# Patient Record
Sex: Female | Born: 1960 | Race: White | Hispanic: No | Marital: Single | Smoking: Never smoker
Health system: Southern US, Community
[De-identification: ages and names within clinical notes are randomized; demographics above are authoritative.]

## PROBLEM LIST (undated history)

## (undated) DIAGNOSIS — N200 Calculus of kidney: Secondary | ICD-10-CM

## (undated) DIAGNOSIS — I10 Essential (primary) hypertension: Secondary | ICD-10-CM

## (undated) DIAGNOSIS — H9190 Unspecified hearing loss, unspecified ear: Secondary | ICD-10-CM

## (undated) DIAGNOSIS — G4730 Sleep apnea, unspecified: Secondary | ICD-10-CM

## (undated) DIAGNOSIS — H539 Unspecified visual disturbance: Secondary | ICD-10-CM

## (undated) DIAGNOSIS — G35 Multiple sclerosis: Secondary | ICD-10-CM

## (undated) DIAGNOSIS — T4145XA Adverse effect of unspecified anesthetic, initial encounter: Secondary | ICD-10-CM

## (undated) DIAGNOSIS — I639 Cerebral infarction, unspecified: Secondary | ICD-10-CM

## (undated) DIAGNOSIS — T8859XA Other complications of anesthesia, initial encounter: Secondary | ICD-10-CM

## (undated) HISTORY — PX: LAPAROSCOPIC GASTRIC RESTRICTIVE DUODENAL PROCEDURE (DUODENAL SWITCH): SHX6667

## (undated) HISTORY — DX: Calculus of kidney: N20.0

## (undated) HISTORY — DX: Unspecified hearing loss, unspecified ear: H91.90

## (undated) HISTORY — DX: Unspecified visual disturbance: H53.9

---

## 1998-01-27 HISTORY — PX: CHOLECYSTECTOMY: SHX55

## 2014-06-03 ENCOUNTER — Encounter: Payer: Self-pay | Admitting: Emergency Medicine

## 2014-06-03 ENCOUNTER — Inpatient Hospital Stay
Admission: EM | Admit: 2014-06-03 | Discharge: 2014-06-07 | DRG: 060 | Disposition: A | Discharge status: Home / Self Care | Payer: BLUE CROSS/BLUE SHIELD | Attending: Internal Medicine | Admitting: Internal Medicine

## 2014-06-03 DIAGNOSIS — I10 Essential (primary) hypertension: Secondary | ICD-10-CM | POA: Diagnosis present

## 2014-06-03 DIAGNOSIS — E669 Obesity, unspecified: Secondary | ICD-10-CM | POA: Diagnosis present

## 2014-06-03 DIAGNOSIS — Z791 Long term (current) use of non-steroidal anti-inflammatories (NSAID): Secondary | ICD-10-CM

## 2014-06-03 DIAGNOSIS — G35 Multiple sclerosis: Secondary | ICD-10-CM | POA: Diagnosis not present

## 2014-06-03 DIAGNOSIS — H9190 Unspecified hearing loss, unspecified ear: Secondary | ICD-10-CM | POA: Diagnosis present

## 2014-06-03 DIAGNOSIS — Z885 Allergy status to narcotic agent status: Secondary | ICD-10-CM

## 2014-06-03 DIAGNOSIS — Z6839 Body mass index (BMI) 39.0-39.9, adult: Secondary | ICD-10-CM

## 2014-06-03 DIAGNOSIS — Z79899 Other long term (current) drug therapy: Secondary | ICD-10-CM

## 2014-06-03 DIAGNOSIS — K219 Gastro-esophageal reflux disease without esophagitis: Secondary | ICD-10-CM | POA: Diagnosis present

## 2014-06-03 DIAGNOSIS — G4733 Obstructive sleep apnea (adult) (pediatric): Secondary | ICD-10-CM | POA: Diagnosis present

## 2014-06-03 DIAGNOSIS — H0520 Unspecified exophthalmos: Secondary | ICD-10-CM | POA: Diagnosis present

## 2014-06-03 DIAGNOSIS — R51 Headache: Secondary | ICD-10-CM | POA: Diagnosis present

## 2014-06-03 DIAGNOSIS — Z9989 Dependence on other enabling machines and devices: Secondary | ICD-10-CM

## 2014-06-03 DIAGNOSIS — F419 Anxiety disorder, unspecified: Secondary | ICD-10-CM | POA: Diagnosis present

## 2014-06-03 DIAGNOSIS — E876 Hypokalemia: Secondary | ICD-10-CM | POA: Diagnosis present

## 2014-06-03 HISTORY — DX: Essential (primary) hypertension: I10

## 2014-06-03 HISTORY — DX: Multiple sclerosis: G35

## 2014-06-03 LAB — POC URINE PREG, ED: URINE-OTHER: NEGATIVE

## 2014-06-03 MED ORDER — ONDANSETRON HCL 4 MG/2ML IJ SOLN
4.0000 mg | Freq: Once | INTRAMUSCULAR | Status: AC
  Administered 2014-06-04: 4 mg via INTRAVENOUS

## 2014-06-03 MED ORDER — METHYLPREDNISOLONE SODIUM SUCC 125 MG IJ SOLR
80.0000 mg | Freq: Once | INTRAMUSCULAR | Status: AC
  Administered 2014-06-04: 80 mg via INTRAVENOUS

## 2014-06-03 NOTE — ED Notes (Signed)
Pt to rm 4 via EMS.  EMS report pt c/o MS flare up with sx of HA and leg weakness/heaviness.  EMS reports pt hypertensive but normal for pt.  EMS report pt hospitalized about 2 years ago for similar.  Pt NAD upon arrival.

## 2014-06-03 NOTE — ED Provider Notes (Signed)
[facility] Emergency Department Provider Note  ____________________________________________  Time seen: 2349  I have reviewed the triage vital signs and the nursing notes.   HISTORY  Chief Complaint Headache and Extremity Weakness  multiple sclerosis flare per patient    HPI Rachel Gilbert is a 54 y.o. female with multiple sclerosis. She reports she is having bilateral leg weakness that began on Thursday. She began to have a headache earlier today. He denies any fever. She denies any other neurologic dysfunction. She is having some nausea. Last time this happened was approximately year and half ago. She was moving more [place] at that time and was hospitalized for a number of days for IV steroids. She denies any chest pain, shortness of breath, or diarrhea     Past Medical History  Diagnosis Date  . Multiple sclerosis     Patient Active Problem List   Diagnosis Date Noted  . Multiple sclerosis exacerbation 06/04/2014    Past Surgical History  Procedure Laterality Date  . Cesarean section  1986    Current Outpatient Rx  Name  Route  Sig  Dispense  Refill  . acetaminophen (TYLENOL) 500 MG tablet   Oral   Take 500 mg by mouth every 6 (six) hours as needed for mild pain.         . brimonidine (ALPHAGAN P) 0.1 % SOLN   Left Eye   Place 2 drops into the left eye 2 (two) times daily.         . carvedilol (COREG) 25 MG tablet   Oral   Take 25 mg by mouth 2 (two) times daily with a meal.         . dalfampridine 10 MG TB12   Oral   Take 10 mg by mouth daily.         [name] doxazosin (CARDURA) 4 MG tablet   Oral   Take 4 mg by mouth daily.         . Fingolimod HCl 0.5 MG CAPS   Oral   Take 1 capsule by mouth daily.         . hydrALAZINE (APRESOLINE) 50 MG tablet   Oral   Take 50 mg by mouth 2 (two) times daily.         [name] ibuprofen (ADVIL,MOTRIN) 200 MG tablet   Oral   Take 200 mg by mouth every 6 (six) hours as needed for  moderate pain.         [name] lisinopril (PRINIVIL,ZESTRIL) 40 MG tablet   Oral   Take 40 mg by mouth daily.         . methylphenidate (RITALIN) 20 MG tablet   Oral   Take 20 mg by mouth 2 (two) times daily.         . ondansetron (ZOFRAN) 4 MG tablet   Oral   Take 4 mg by mouth every 8 (eight) hours as needed for nausea or vomiting.         . ranitidine (ZANTAC) 300 MG capsule   Oral   Take 300 mg by mouth every evening.         [name] spironolactone (ALDACTONE) 50 MG tablet   Oral   Take 50 mg by mouth daily.           Allergies Percocet  History reviewed. No pertinent family history.  Social History History  Substance Use Topics  . Smoking status: Never Smoker   . Smokeless tobacco: Never Used  .  Alcohol Use: Yes    Review of Systems Constitutional: Negative for fever. ENT: Negative for sore throat. Cardiovascular: Negative for chest pain. Respiratory: Negative for shortness of breath. Gastrointestinal: Negative for abdominal pain. She does have some nausea with her current symptoms. Genitourinary: Negative for dysuria. Musculoskeletal: Negative for back pain. Skin: Negative for rash. Neurological: Positive for headaches, positive for bilateral lower extremity weakness. See history of present illness   10-point ROS otherwise negative.  ____________________________________________   PHYSICAL EXAM:  VITAL SIGNS: ED Triage Vitals  Enc Vitals Group     BP 06/03/14 2133 151/89 mmHg     Pulse Rate 06/03/14 2133 74     Resp 06/03/14 2133 18     Temp 06/03/14 2133 98.3 F (36.8 C)     Temp Source 06/03/14 2133 Oral     SpO2 06/03/14 2133 99 %     Weight 06/03/14 2133 290 lb (131.543 kg)     Height 06/03/14 2133  (1.753 m)     Head Cir --      Peak Flow --      Pain Score 06/03/14 2133 10     Pain Loc --      Pain Edu? --      Excl. in GC? --     Constitutional: Alert and oriented. Hard of hearing, mild discomfort. ENT   Head:  Normocephalic and atraumatic.   Nose: No congestion/rhinnorhea.   Mouth/Throat: Mucous membranes are moist. Cardiovascular: Normal rate, regular rhythm. Respiratory: Normal respiratory effort without tachypnea. Breath sounds are clear and equal bilaterally. No wheezes/rales/rhonchi. Gastrointestinal: Soft and nontender. No distention.  Back: There is no CVA tenderness. Musculoskeletal: Nontender with normal range of motion in all extremities.  No noted edema. Neurologic:  Normal speech and language limited by her baseline hearing deficit.[name] No gross focal neurologic deficits are appreciated. She has equal grip strength bilaterally. She has 5 or 5 strength in both legs. She continues to report subjective weakness. Skin:  Skin is warm, dry. No rash noted. Psychiatric: Mood and affect are normal. Speech and behavior are normal.  ____________________________________________     INITIAL IMPRESSION / ASSESSMENT AND PLAN / ED COURSE  It is difficult to gauge the severity of this patient's symptoms due to multiple sclerosis. She and her husband have a strong preference the patient be admitted for ongoing care. We will begin care with steroids and Zofran currently and reassessed.   LABS (pertinent positives/negatives)   blood tests are overall unremarkable. A potassium is low at 3.2. Hemoglobin is good at 14.5 with a white blood cell count of 8.9.  ____________________________________________   EKG  EKG at 00:03-normal sinus rhythm at 62, left ventricular hypertrophy. Intervals are normal, axis is normal.  ____________________________________________    RADIOLOGY  Large cardiac silhouette otherwise unremarkable chest x-ray.  ____________________________________________   PROCEDURES  Procedure(s) performed: None  Critical Care performed: No  ____________________________________________   INITIAL IMPRESSION / ASSESSMENT AND PLAN / ED COURSE  We have treated this  patient's suspected MS flare with Solu-Medrol. We will admit her to the hospital for ongoing steroids and further evaluation. This was discussed with Dr. Clint Guy of the hospitalist service.  ____________________________________________   FINAL CLINICAL IMPRESSION(S) / ED DIAGNOSES  Final diagnoses:  Multiple sclerosis exacerbation      Darien Ramus, MD 06/04/14 0330

## 2014-06-04 ENCOUNTER — Encounter: Payer: Self-pay | Admitting: Internal Medicine

## 2014-06-04 ENCOUNTER — Emergency Department: Payer: BLUE CROSS/BLUE SHIELD

## 2014-06-04 DIAGNOSIS — K219 Gastro-esophageal reflux disease without esophagitis: Secondary | ICD-10-CM | POA: Diagnosis present

## 2014-06-04 DIAGNOSIS — G35 Multiple sclerosis: Secondary | ICD-10-CM | POA: Diagnosis present

## 2014-06-04 DIAGNOSIS — I10 Essential (primary) hypertension: Secondary | ICD-10-CM | POA: Diagnosis present

## 2014-06-04 DIAGNOSIS — E876 Hypokalemia: Secondary | ICD-10-CM | POA: Diagnosis present

## 2014-06-04 DIAGNOSIS — R51 Headache: Secondary | ICD-10-CM | POA: Diagnosis present

## 2014-06-04 DIAGNOSIS — H9190 Unspecified hearing loss, unspecified ear: Secondary | ICD-10-CM | POA: Diagnosis present

## 2014-06-04 DIAGNOSIS — Z9989 Dependence on other enabling machines and devices: Secondary | ICD-10-CM

## 2014-06-04 DIAGNOSIS — Z6839 Body mass index (BMI) 39.0-39.9, adult: Secondary | ICD-10-CM | POA: Diagnosis not present

## 2014-06-04 DIAGNOSIS — E669 Obesity, unspecified: Secondary | ICD-10-CM | POA: Diagnosis present

## 2014-06-04 DIAGNOSIS — Z791 Long term (current) use of non-steroidal anti-inflammatories (NSAID): Secondary | ICD-10-CM | POA: Diagnosis not present

## 2014-06-04 DIAGNOSIS — H0520 Unspecified exophthalmos: Secondary | ICD-10-CM | POA: Diagnosis present

## 2014-06-04 DIAGNOSIS — Z79899 Other long term (current) drug therapy: Secondary | ICD-10-CM | POA: Diagnosis not present

## 2014-06-04 DIAGNOSIS — Z885 Allergy status to narcotic agent status: Secondary | ICD-10-CM | POA: Diagnosis not present

## 2014-06-04 DIAGNOSIS — F419 Anxiety disorder, unspecified: Secondary | ICD-10-CM | POA: Diagnosis present

## 2014-06-04 DIAGNOSIS — G4733 Obstructive sleep apnea (adult) (pediatric): Secondary | ICD-10-CM | POA: Diagnosis present

## 2014-06-04 LAB — CBC WITH DIFFERENTIAL/PLATELET
BASOS PCT: 0 %
Basophils Absolute: 0 10*3/uL (ref 0–0.1)
EOS ABS: 0.1 10*3/uL (ref 0–0.7)
Eosinophils Relative: 1 %
HCT: 44.2 % (ref 35.0–47.0)
HEMOGLOBIN: 14.5 g/dL (ref 12.0–16.0)
LYMPHS ABS: 0.6 10*3/uL — AB (ref 1.0–3.6)
LYMPHS PCT: 7 %
MCH: 29.2 pg (ref 26.0–34.0)
MCHC: 32.7 g/dL (ref 32.0–36.0)
MCV: 89.2 fL (ref 80.0–100.0)
MONOS PCT: 6 %
Monocytes Absolute: 0.6 10*3/uL (ref 0.2–0.9)
NEUTROS ABS: 7.6 10*3/uL — AB (ref 1.4–6.5)
Neutrophils Relative %: 86 %
Platelets: 274 10*3/uL (ref 150–440)
RBC: 4.96 MIL/uL (ref 3.80–5.20)
RDW: 13.8 % (ref 11.5–14.5)
WBC: 8.9 10*3/uL (ref 3.6–11.0)

## 2014-06-04 LAB — URINALYSIS COMPLETE WITH MICROSCOPIC (ARMC ONLY)
BILIRUBIN URINE: NEGATIVE
Glucose, UA: 50 mg/dL — AB
KETONES UR: NEGATIVE mg/dL
Leukocytes, UA: NEGATIVE
Nitrite: NEGATIVE
Protein, ur: 30 mg/dL — AB
Specific Gravity, Urine: 1.029 (ref 1.005–1.030)
pH: 5 (ref 5.0–8.0)

## 2014-06-04 LAB — CBC
HCT: 46.1 % (ref 35.0–47.0)
Hemoglobin: 15.1 g/dL (ref 12.0–16.0)
MCH: 29.6 pg (ref 26.0–34.0)
MCHC: 32.7 g/dL (ref 32.0–36.0)
MCV: 90.7 fL (ref 80.0–100.0)
PLATELETS: 263 10*3/uL (ref 150–440)
RBC: 5.08 MIL/uL (ref 3.80–5.20)
RDW: 13.6 % (ref 11.5–14.5)
WBC: 8 10*3/uL (ref 3.6–11.0)

## 2014-06-04 LAB — COMPREHENSIVE METABOLIC PANEL
ALT: 16 U/L (ref 14–54)
ANION GAP: 7 (ref 5–15)
AST: 15 U/L (ref 15–41)
Albumin: 3.8 g/dL (ref 3.5–5.0)
Alkaline Phosphatase: 52 U/L (ref 38–126)
BUN: 12 mg/dL (ref 6–20)
CO2: 25 mmol/L (ref 22–32)
Calcium: 8.8 mg/dL — ABNORMAL LOW (ref 8.9–10.3)
Chloride: 107 mmol/L (ref 101–111)
Creatinine, Ser: 1.06 mg/dL — ABNORMAL HIGH (ref 0.44–1.00)
GFR calc non Af Amer: 59 mL/min — ABNORMAL LOW (ref >60)
Glucose, Bld: 109 mg/dL — ABNORMAL HIGH (ref 65–99)
POTASSIUM: 3.2 mmol/L — AB (ref 3.5–5.1)
SODIUM: 139 mmol/L (ref 135–145)
TOTAL PROTEIN: 6.6 g/dL (ref 6.5–8.1)
Total Bilirubin: 1 mg/dL (ref 0.3–1.2)

## 2014-06-04 LAB — GLUCOSE, CAPILLARY
GLUCOSE-CAPILLARY: 155 mg/dL — AB (ref 70–99)
GLUCOSE-CAPILLARY: 174 mg/dL — AB (ref 70–99)
GLUCOSE-CAPILLARY: 158 mg/dL — AB (ref 70–99)
Glucose-Capillary: 152 mg/dL — ABNORMAL HIGH (ref 70–99)
Glucose-Capillary: 147 mg/dL — ABNORMAL HIGH (ref 70–99)

## 2014-06-04 LAB — CREATININE, SERUM
Creatinine, Ser: 1.14 mg/dL — ABNORMAL HIGH (ref 0.44–1.00)
GFR calc Af Amer: >60 mL/min (ref >60)
GFR calc non Af Amer: 54 mL/min — ABNORMAL LOW (ref >60)

## 2014-06-04 MED ORDER — INSULIN ASPART 100 UNIT/ML ~~LOC~~ SOLN
0.0000 [IU] | Freq: Every day | SUBCUTANEOUS | Status: DC
  Filled 2014-06-04: qty 3

## 2014-06-04 MED ORDER — DALFAMPRIDINE ER 10 MG PO TB12
10.0000 mg | ORAL_TABLET | Freq: Every day | ORAL | Status: DC
  Administered 2014-06-04 – 2014-06-07 (×4): 10 mg via ORAL
  Filled 2014-06-04: qty 1

## 2014-06-04 MED ORDER — FAMOTIDINE 20 MG PO TABS
20.0000 mg | ORAL_TABLET | Freq: Every day | ORAL | Status: DC
  Administered 2014-06-04 – 2014-06-07 (×4): 20 mg via ORAL
  Filled 2014-06-04 (×4): qty 1

## 2014-06-04 MED ORDER — ACETAMINOPHEN 325 MG PO TABS
650.0000 mg | ORAL_TABLET | Freq: Four times a day (QID) | ORAL | Status: DC | PRN
  Administered 2014-06-04: 650 mg via ORAL

## 2014-06-04 MED ORDER — HEPARIN SODIUM (PORCINE) 5000 UNIT/ML IJ SOLN
5000.0000 [IU] | Freq: Three times a day (TID) | INTRAMUSCULAR | Status: DC
  Administered 2014-06-04 – 2014-06-07 (×10): 5000 [IU] via SUBCUTANEOUS
  Filled 2014-06-04 (×11): qty 1

## 2014-06-04 MED ORDER — ONDANSETRON HCL 4 MG PO TABS: 4.0000 mg | ORAL_TABLET | Freq: Four times a day (QID) | ORAL | Status: DC | PRN

## 2014-06-04 MED ORDER — SPIRONOLACTONE 100 MG PO TABS
50.0000 mg | ORAL_TABLET | Freq: Every day | ORAL | Status: DC
  Administered 2014-06-04 – 2014-06-07 (×4): 50 mg via ORAL
  Filled 2014-06-04 (×3): qty 1
  Filled 2014-06-04: qty 2

## 2014-06-04 MED ORDER — ACETAMINOPHEN 325 MG PO TABS
ORAL_TABLET | ORAL | Status: AC
  Administered 2014-06-04: 650 mg via ORAL
  Filled 2014-06-04: qty 2

## 2014-06-04 MED ORDER — CARVEDILOL 25 MG PO TABS
25.0000 mg | ORAL_TABLET | Freq: Two times a day (BID) | ORAL | Status: DC
  Administered 2014-06-04 – 2014-06-07 (×7): 25 mg via ORAL
  Filled 2014-06-04 (×8): qty 1

## 2014-06-04 MED ORDER — DOXAZOSIN MESYLATE 4 MG PO TABS
4.0000 mg | ORAL_TABLET | Freq: Every day | ORAL | Status: DC
  Administered 2014-06-04 – 2014-06-07 (×4): 4 mg via ORAL
  Filled 2014-06-04 (×4): qty 1

## 2014-06-04 MED ORDER — FINGOLIMOD HCL 0.5 MG PO CAPS
1.0000 | ORAL_CAPSULE | Freq: Every day | ORAL | Status: DC
  Administered 2014-06-04 – 2014-06-07 (×4): 0.5 mg via ORAL
  Filled 2014-06-04: qty 1

## 2014-06-04 MED ORDER — SODIUM CHLORIDE 0.9 % IV SOLN
1000.0000 mg | Freq: Every day | INTRAVENOUS | Status: DC
  Administered 2014-06-04 – 2014-06-06 (×3): 1000 mg via INTRAVENOUS
  Filled 2014-06-04 (×5): qty 8

## 2014-06-04 MED ORDER — INSULIN ASPART 100 UNIT/ML ~~LOC~~ SOLN
0.0000 [IU] | Freq: Three times a day (TID) | SUBCUTANEOUS | Status: DC
  Administered 2014-06-04 (×3): 2 [IU] via SUBCUTANEOUS
  Administered 2014-06-05: 3 [IU] via SUBCUTANEOUS
  Administered 2014-06-07: 1 [IU] via SUBCUTANEOUS
  Filled 2014-06-04: qty 2
  Filled 2014-06-04: qty 1
  Filled 2014-06-04 (×2): qty 2

## 2014-06-04 MED ORDER — POTASSIUM CHLORIDE CRYS ER 20 MEQ PO TBCR
40.0000 meq | EXTENDED_RELEASE_TABLET | Freq: Once | ORAL | Status: AC
  Administered 2014-06-04: 40 meq via ORAL
  Filled 2014-06-04: qty 2

## 2014-06-04 MED ORDER — ONDANSETRON HCL 4 MG/2ML IJ SOLN
INTRAMUSCULAR | Status: AC
  Filled 2014-06-04: qty 2

## 2014-06-04 MED ORDER — HYDRALAZINE HCL 25 MG PO TABS
50.0000 mg | ORAL_TABLET | Freq: Two times a day (BID) | ORAL | Status: DC
  Administered 2014-06-04 – 2014-06-07 (×6): 50 mg via ORAL
  Filled 2014-06-04 (×7): qty 2

## 2014-06-04 MED ORDER — BRIMONIDINE TARTRATE 0.15 % OP SOLN
1.0000 [drp] | Freq: Two times a day (BID) | OPHTHALMIC | Status: DC
  Administered 2014-06-04 – 2014-06-07 (×7): 1 [drp] via OPHTHALMIC
  Filled 2014-06-04: qty 5

## 2014-06-04 MED ORDER — LISINOPRIL 20 MG PO TABS
40.0000 mg | ORAL_TABLET | Freq: Every day | ORAL | Status: DC
  Administered 2014-06-04 – 2014-06-07 (×4): 40 mg via ORAL
  Filled 2014-06-04 (×4): qty 2

## 2014-06-04 MED ORDER — ONDANSETRON HCL 4 MG/2ML IJ SOLN: 4.0000 mg | Freq: Four times a day (QID) | INTRAMUSCULAR | Status: DC | PRN

## 2014-06-04 MED ORDER — SODIUM CHLORIDE 0.9 % IV SOLN
INTRAVENOUS | Status: DC
  Administered 2014-06-04 – 2014-06-05 (×3): via INTRAVENOUS

## 2014-06-04 MED ORDER — METHYLPREDNISOLONE SODIUM SUCC 125 MG IJ SOLR
INTRAMUSCULAR | Status: AC
  Filled 2014-06-04: qty 2

## 2014-06-04 NOTE — Progress Notes (Signed)
RN CALLED DR Clent Ridges AFTER PT REQUESTING TYLENOL FOR HEADACHE. ALLERGIES TO PERCOCET  BUT  PT REPORTS SHE CAN TAKE TYLENOL. MD TO PLACE ORDER

## 2014-06-04 NOTE — ED Notes (Signed)
Pt requesting to sleep and has home CPAP.  Respiratory called to inspect machine.

## 2014-06-04 NOTE — H&P (Signed)
[facility] [place] at [facility]   PATIENT NAME: Rachel Gilbert    MR#:  [id number]  DATE OF BIRTH:  November 29, 1960   DATE OF ADMISSION:  06/03/2014  PRIMARY CARE PHYSICIAN: No primary care provider on file.   REQUESTING/REFERRING PHYSICIAN: Carollee Massed  CHIEF COMPLAINT:   Chief Complaint  Patient presents with  . Headache  . Extremity Weakness    HISTORY OF PRESENT ILLNESS:  Rachel Gilbert  is a 54 y.o. female with a known history of multiple sclerosis who is presenting with generalized weakness. She describes 3-4 day duration of progressively worsening generalized weakness, fatigue. She denies any focal neurological deficits. Denies fever, chills, prior illnesses or further symptomatology. She is unfortunately required 3 prior hospitalizations for multiple sclerosis exacerbation.  PAST MEDICAL HISTORY:   Past Medical History  Diagnosis Date  . Multiple sclerosis   . Hypertension     PAST SURGICAL HISTORY:   Past Surgical History  Procedure Laterality Date  . Cesarean section  1986    SOCIAL HISTORY:   History  Substance Use Topics  . Smoking status: Never Smoker   . Smokeless tobacco: Never Used  . Alcohol Use: Yes     Comment: Occasional    FAMILY HISTORY:   Family History  Problem Relation Age of Onset  . Stroke Other     DRUG ALLERGIES:   Allergies  Allergen Reactions  . Percocet [Oxycodone-Acetaminophen] Anaphylaxis    REVIEW OF SYSTEMS:  REVIEW OF SYSTEMS:  CONSTITUTIONAL: Denies fevers, chills, positive for fatigue, weakness.  EYES: Denies blurred vision, double vision, or eye pain.  EARS, NOSE, THROAT: Denies tinnitus, ear pain, hearing loss.  RESPIRATORY: denies cough, shortness of breath, wheezing  CARDIOVASCULAR: Denies chest pain, palpitations, edema.  GASTROINTESTINAL: Denies nausea, vomiting, diarrhea, abdominal pain.  GENITOURINARY: Denies dysuria, hematuria.  ENDOCRINE: Denies nocturia or thyroid  problems. HEMATOLOGIC AND LYMPHATIC: Denies easy bruising or bleeding.  SKIN: Denies rash or lesions.  MUSCULOSKELETAL: Denies pain in neck, back, shoulder, knees, hips, or further arthritic symptoms.  NEUROLOGIC: Denies paralysis, paresthesias.  PSYCHIATRIC: Denies anxiety or depressive symptoms. Otherwise full review of systems performed by me is negative.   MEDICATIONS AT HOME:   Prior to Admission medications   Medication Sig Start Date End Date Taking? Authorizing Provider  acetaminophen (TYLENOL) 500 MG tablet Take 500 mg by mouth every 6 (six) hours as needed for mild pain.   Yes Historical Provider, MD  brimonidine (ALPHAGAN P) 0.1 % SOLN Place 2 drops into the left eye 2 (two) times daily.   Yes Historical Provider, MD  carvedilol (COREG) 25 MG tablet Take 25 mg by mouth 2 (two) times daily with a meal.   Yes Historical Provider, MD  dalfampridine 10 MG TB12 Take 10 mg by mouth daily.   Yes Historical Provider, MD  doxazosin (CARDURA) 4 MG tablet Take 4 mg by mouth daily.   Yes Historical Provider, MD  Fingolimod HCl 0.5 MG CAPS Take 1 capsule by mouth daily.   Yes Historical Provider, MD  hydrALAZINE (APRESOLINE) 50 MG tablet Take 50 mg by mouth 2 (two) times daily.   Yes Historical Provider, MD  ibuprofen (ADVIL,MOTRIN) 200 MG tablet Take 200 mg by mouth every 6 (six) hours as needed for moderate pain.   Yes Historical Provider, MD  lisinopril (PRINIVIL,ZESTRIL) 40 MG tablet Take 40 mg by mouth daily.   Yes Historical Provider, MD  methylphenidate (RITALIN) 20 MG tablet Take 20 mg by mouth 2 (two) times  daily.   Yes Historical Provider, MD  ondansetron (ZOFRAN) 4 MG tablet Take 4 mg by mouth every 8 (eight) hours as needed for nausea or vomiting.   Yes Historical Provider, MD  ranitidine (ZANTAC) 300 MG capsule Take 300 mg by mouth every evening.   Yes Historical Provider, MD  spironolactone (ALDACTONE) 50 MG tablet Take 50 mg by mouth daily.   Yes Historical Provider, MD       VITAL SIGNS:  Blood pressure 127/71, pulse 66, temperature 98.3 F (36.8 C), temperature source Oral, resp. rate 16, height  (1.753 m), weight 290 lb (131.543 kg), last menstrual period 06/01/2014, SpO2 96 %.  PHYSICAL EXAMINATION:  VITAL SIGNS: Filed Vitals:   06/04/14 0320  BP: 127/71  Pulse: 66  Temp:   Resp: 16   GENERAL:53 y.o.female weak appearing HEAD: Normocephalic, atraumatic.  EYES: Pupils equal, round, reactive to light. Extraocular muscles intact. No scleral icterus.  MOUTH: Moist mucosal membrane. Dentition intact. No abscess noted.  EAR, NOSE, THROAT: Clear without exudates. No external lesions.  NECK: Supple. No thyromegaly. No nodules. No JVD.  PULMONARY: Clear to ascultation, without wheeze rails or rhonci. No use of accessory muscles, Good respiratory effort. good air entry bilaterally CHEST: Nontender to palpation.  CARDIOVASCULAR: S1 and S2. Regular rate and rhythm. No murmurs, rubs, or gallops. No edema. Pedal pulses 2+ bilaterally.  GASTROINTESTINAL: Soft, nontender, nondistended. No masses. Positive bowel sounds. No hepatosplenomegaly.  MUSCULOSKELETAL: No swelling, clubbing, or edema. Range of motion full in all extremities.  NEUROLOGIC: Cranial nerves II through XII are intact. Strength 4 out of 5 in all extremities including proximal/distal flexion/extension. Sensation intact. Reflexes intact. Pronator drift intact SKIN: No ulceration, lesions, rashes, or cyanosis. Skin warm and dry. Turgor intact.  PSYCHIATRIC: Mood, affect within normal limits. The patient is awake, alert and oriented x 3. Insight, judgment intact.    LABORATORY PANEL:   CBC  Recent Labs Lab 06/04/14 0011  WBC 8.9  HGB 14.5  HCT 44.2  PLT 274   ------------------------------------------------------------------------------------------------------------------  Chemistries   Recent Labs Lab 06/04/14 0011  NA 139  K 3.2*  CL 107  CO2 25  GLUCOSE 109*  BUN 12   CREATININE 1.06*  CALCIUM 8.8*  AST 15  ALT 16  ALKPHOS 52  BILITOT 1.0   ------------------------------------------------------------------------------------------------------------------  Cardiac Enzymes No results for input(s): TROPONINI in the last 168 hours. ------------------------------------------------------------------------------------------------------------------  RADIOLOGY:  Dg Chest 2 View  06/04/2014   CLINICAL DATA:  Disoriented.  Fatigue.  EXAM: CHEST  2 VIEW  COMPARISON:  None.  FINDINGS: The cardiac silhouette is enlarged. The lungs are clear. There are no effusions. Pulmonary vasculature is normal.  IMPRESSION: Large cardiac silhouette, probably at least partially due to adipose tissue. Otherwise unremarkable.   Electronically Signed   By: Ellery Plunk M.D.   On: 06/04/2014 01:35    EKG:   Orders placed or performed during the hospital encounter of 06/03/14  . ED EKG  . ED EKG    IMPRESSION AND PLAN:   54 year old Caucasian female history of multiple sclerosis requiring hospitalizations in the past presenting with 3 day duration generally worsening fatigue and weakness.  1. Multiple sclerosis exacerbation: She has received 80 mg IV Solu-Medrol in the emergency department increase steroids to 1 g Solu-Medrol IV for a total of 3 days. Given given steroids we'll check blood glucose with meals and at bedtime if remains within normal limits can discontinue after 1 day. Will consult neurology for further recommendations.  Her last MRI of her brain was performed in December 2015 which revealed no acute changes from prior.  2. Essential hypertension: Continue home medications including Coreg, lisinopril, spironolactone and hydralazine. 3. Hypokalemia: Replace potassium to  4-5 4. Gastroesophageal reflux disease without esophagitis: Continue H2 blocker 5.Venous thromboembolism prophylactic: Heparin subcutaneous     All the records are reviewed and case  discussed with ED provider. Management plans discussed with the patient, family and they are in agreement.  CODE STATUS: Full code  TOTAL TIME TAKING CARE OF THIS PATIENT: 35 minutes.    Rachel Gilbert,  Mardi Mainland.D on 06/04/2014 at 3:37 AM  Between 7am to 6pm - Pager - [phone]  After 6pm: House Pager: - [phone]  [facility]  Office  [phone]  CC: Primary care physician; No primary care provider on file.

## 2014-06-04 NOTE — Consult Note (Signed)
CC: Lower extremity weakness.   HPI: Rachel Gilbert is an 54 y.o. female with hx of MS diagnosed in 2012. Last exacerbation about 1.5 yrs ago with generalized weakness. At that time pt was given solumedrol IV. On gillenia and amphyra daily. Since Thursday pt has been having difficulty ambulating with her legs giving out.  Last MS exacerbation pt had new pontine lesions.   Past Medical History  Diagnosis Date  . Multiple sclerosis   . Hypertension     Past Surgical History  Procedure Laterality Date  . Cesarean section  1986    Family History  Problem Relation Age of Onset  . Stroke Other     Social History:  reports that she has never smoked. She has never used smokeless tobacco. She reports that she drinks alcohol. She reports that she does not use illicit drugs.  Allergies  Allergen Reactions  . Percocet [Oxycodone-Acetaminophen] Anaphylaxis    Medications: I have reviewed the patient's current medications.  ROS: History obtained from the patient  General ROS: negative for - chills, fatigue, fever, night sweats, weight gain or weight loss Psychological ROS: negative for - behavioral disorder, hallucinations, memory difficulties, mood swings or suicidal ideation Ophthalmic ROS: negative for - blurry vision, double vision, eye pain or loss of vision ENT ROS: negative for - epistaxis, nasal discharge, oral lesions, sore throat, tinnitus or vertigo Allergy and Immunology ROS: negative for - hives or itchy/watery eyes Hematological and Lymphatic ROS: negative for - bleeding problems, bruising or swollen lymph nodes Endocrine ROS: negative for - galactorrhea, hair pattern changes, polydipsia/polyuria or temperature intolerance Respiratory ROS: negative for - cough, hemoptysis, shortness of breath or wheezing Cardiovascular ROS: negative for - chest pain, dyspnea on exertion, edema or irregular heartbeat Gastrointestinal ROS: negative for - abdominal pain, diarrhea, hematemesis,  nausea/vomiting or stool incontinence Genito-Urinary ROS: negative for - dysuria, hematuria, incontinence or urinary frequency/urgency Musculoskeletal ROS: negative for - joint swelling or muscular weakness Neurological ROS: as noted in HPI Dermatological ROS: negative for rash and skin lesion changes  Physical Examination: Blood pressure 155/84, pulse 81, temperature 98.1 F (36.7 C), temperature source Oral, resp. rate 16, height  (1.753 m), weight 122.607 kg (270 lb 4.8 oz), last menstrual period 06/01/2014, SpO2 95 %.  HEENT-  Normocephalic, no lesions, without obvious abnormality.  Normal external eye and conjunctiva.  Normal TM's bilaterally.  Normal auditory canals and external ears. Normal external nose, mucus membranes and septum.  Normal pharynx. Cardiovascular- regular rate and rhythm, S1, S2 normal, no murmur, click, rub or gallop, pulses palpable throughout   Lungs- Heart exam - S1, S2 normal, no murmur, no gallop, rate regular Abdomen- soft, non-tender; bowel sounds normal; no masses,  no organomegaly Extremities- no edema Lymph-no adenopathy palpable Musculoskeletal-no joint tenderness, deformity or swelling Skin-warm and dry, no hyperpigmentation, vitiligo, or suspicious lesions  Neurological Examination Mental Status: Alert, oriented, thought content appropriate.  Speech fluent without evidence of aphasia.  Able to follow 3 step commands without difficulty. Cranial Nerves: II: Discs flat bilaterally; Visual fields grossly normal, pupils equal, round, reactive to light and accommodation III,IV, VI: ptosis not present, extra-ocular motions intact bilaterally V,VII: smile symmetric, facial light touch sensation normal bilaterally VIII: hearing normal bilaterally IX,X: gag reflex present XI: bilateral shoulder shrug XII: midline tongue extension Motor: Right : Upper extremity   5/5    Left:     Upper extremity   5/5  Lower extremity   4/5     Lower extremity  4/5 Tone and bulk:normal tone throughout; no atrophy noted Sensory: Pinprick and light touch intact throughout, bilaterally Deep Tendon Reflexes: 2+ and symmetric throughout Plantars: Right: downgoing   Left: downgoing Cerebellar: normal finger-to-nose, normal rapid alternating movements and normal heel-to-shin test Gait:not examined       Laboratory Studies:   Basic Metabolic Panel:  Recent Labs Lab 06/04/14 0011 06/04/14 0438  NA 139  --   K 3.2*  --   CL 107  --   CO2 25  --   GLUCOSE 109*  --   BUN 12  --   CREATININE 1.06* 1.14*  CALCIUM 8.8*  --     Liver Function Tests:  Recent Labs Lab 06/04/14 0011  AST 15  ALT 16  ALKPHOS 52  BILITOT 1.0  PROT 6.6  ALBUMIN 3.8   No results for input(s): LIPASE, AMYLASE in the last 168 hours. No results for input(s): AMMONIA in the last 168 hours.  CBC:  Recent Labs Lab 06/04/14 0011 06/04/14 0438  WBC 8.9 8.0  NEUTROABS 7.6*  --   HGB 14.5 15.1  HCT 44.2 46.1  MCV 89.2 90.7  PLT 274 263    Cardiac Enzymes: No results for input(s): CKTOTAL, CKMB, CKMBINDEX, TROPONINI in the last 168 hours.  BNP: Invalid input(s): POCBNP  CBG:  Recent Labs Lab 06/04/14 0800 06/04/14 1107  GLUCAP 152* 155*    Microbiology: No results found for this or any previous visit.  Coagulation Studies: No results for input(s): LABPROT, INR in the last 72 hours.  Urinalysis: No results for input(s): COLORURINE, LABSPEC, PHURINE, GLUCOSEU, HGBUR, BILIRUBINUR, KETONESUR, PROTEINUR, UROBILINOGEN, NITRITE, LEUKOCYTESUR in the last 168 hours.  Invalid input(s): APPERANCEUR  Lipid Panel:  No results found for: CHOL, TRIG, HDL, CHOLHDL, VLDL, LDLCALC  HgbA1C: No results found for: HGBA1C  Urine Drug Screen:  No results found for: LABOPIA, COCAINSCRNUR, LABBENZ, AMPHETMU, THCU, LABBARB  Alcohol Level: No results for input(s): ETH in the last 168 hours.  Other results: EKG: normal EKG, normal sinus rhythm, unchanged from  previous tracings.  Imaging: Dg Chest 2 View  06/04/2014   CLINICAL DATA:  Disoriented.  Fatigue.  EXAM: CHEST  2 VIEW  COMPARISON:  None.  FINDINGS: The cardiac silhouette is enlarged. The lungs are clear. There are no effusions. Pulmonary vasculature is normal.  IMPRESSION: Large cardiac silhouette, probably at least partially due to adipose tissue. Otherwise unremarkable.   Electronically Signed   By: Ellery Plunk M.D.   On: 06/04/2014 01:35   3  Assessment/Plan: hx of MS diagnosed in 2012. Last exacerbation about 1.5 yrs ago with generalized weakness. At that time pt was given solumedrol IV. On gillenia and amphyra daily. Since Thursday pt has been having diffuculty ambulating with her legs giving out.  Last MS exacerbation pt had new pontine lesions.   - solumedrol 1gm IV daily x3 days for suspected MS exacerbation - MRI brain and C spine w/ and w/out contrast to look for acute lesions has been ordered - Pt/OT - likely d/c planning early next week - urinalysis sent. If UA is positive and MRI c spine and brain negative would d/c steroids since UTI can mimic MS exacerbation .    06/04/2014, 12:22 PM

## 2014-06-04 NOTE — Progress Notes (Signed)
Up ad lib in room . Speech occasionally delayed. Continued muscle weakness increased on left. Tolerated iv steroids. fsbs <200. Awaiting mri

## 2014-06-04 NOTE — Progress Notes (Signed)
[facility] - [place] at [facility]   PATIENT NAME: Rachel Gilbert    MR#:  [id number]  DATE OF BIRTH:  28-Apr-1960  SUBJECTIVE:  CHIEF COMPLAINT:   Chief Complaint  Patient presents with  . Headache  . Extremity Weakness   Sleeping with BiPAP.  Has headache, getting better.  REVIEW OF SYSTEMS:  Review of Systems  Constitutional: Negative for fever and chills.  HENT: Negative for hearing loss and tinnitus.   Eyes: Negative for blurred vision, double vision and pain.  Respiratory: Negative for cough and shortness of breath.   Cardiovascular: Negative for chest pain and palpitations.  Gastrointestinal: Negative for heartburn and abdominal pain.  Genitourinary: Negative for dysuria.  Musculoskeletal: Positive for myalgias.  Skin: Negative for itching and rash.  Neurological: Positive for sensory change, focal weakness and headaches.     DRUG ALLERGIES:   Allergies  Allergen Reactions  . Percocet [Oxycodone-Acetaminophen] Anaphylaxis    VITALS:  Blood pressure 155/84, pulse 81, temperature 98.1 F (36.7 C), temperature source Oral, resp. rate 16, height  (1.753 m), weight 122.607 kg (270 lb 4.8 oz), last menstrual period 06/01/2014, SpO2 95 %.  PHYSICAL EXAMINATION:  Physical Exam  Constitutional: She is oriented to person, place, and time. She appears well-developed.  HENT:  Head: Normocephalic and atraumatic.  Eyes: EOM are normal. Pupils are equal, round, and reactive to light.  Neck: Normal range of motion.  Cardiovascular: Normal rate and normal heart sounds.   No murmur heard. Pulmonary/Chest: Effort normal and breath sounds normal.  Abdominal: Soft. Bowel sounds are normal.  Musculoskeletal: Normal range of motion.  Neurological: She is alert and oriented to person, place, and time.  Skin: Skin is warm and dry.     LABORATORY PANEL:   CBC  Recent Labs Lab 06/04/14 0438  WBC 8.0  HGB 15.1  HCT 46.1  PLT 263    ------------------------------------------------------------------------------------------------------------------  Chemistries   Recent Labs Lab 06/04/14 0011 06/04/14 0438  NA 139  --   K 3.2*  --   CL 107  --   CO2 25  --   GLUCOSE 109*  --   BUN 12  --   CREATININE 1.06* 1.14*  CALCIUM 8.8*  --   AST 15  --   ALT 16  --   ALKPHOS 52  --   BILITOT 1.0  --    ------------------------------------------------------------------------------------------------------------------  Cardiac Enzymes No results for input(s): TROPONINI in the last 168 hours. ------------------------------------------------------------------------------------------------------------------  RADIOLOGY:  Dg Chest 2 View  06/04/2014   CLINICAL DATA:  Disoriented.  Fatigue.  EXAM: CHEST  2 VIEW  COMPARISON:  None.  FINDINGS: The cardiac silhouette is enlarged. The lungs are clear. There are no effusions. Pulmonary vasculature is normal.  IMPRESSION: Large cardiac silhouette, probably at least partially due to adipose tissue. Otherwise unremarkable.   Electronically Signed   By: Ellery Plunk M.D.   On: 06/04/2014 01:35    EKG:   Orders placed or performed during the hospital encounter of 06/03/14  . ED EKG  . ED EKG    ASSESSMENT AND PLAN:   1) Acute exacerbation of multiple sclerosis - lower extremity weakness and fatigue - appreciate neurology consultation - continue with solumedrol - will need a local neurologist for outpatient follow up - MRI brain and spinal cord pending     All the records are reviewed and case discussed with Care Management/Social Workerr. Management plans discussed with the patient, family and they are in  agreement.  CODE STATUS: full  TOTAL TIME TAKING CARE OF THIS PATIENT: 25 minutes.   POSSIBLE D/C IN 3 DAYS, DEPENDING ON CLINICAL CONDITION.   Elby Showers M.D on 06/04/2014 at 1:39 PM  Between 7am to 6pm - Pager - [phone]  After 6pm go to  [email or web address] - password [email or web address] [facility]  [facility]  Office  [phone]  CC: Primary care physician; No primary care provider on file.

## 2014-06-04 NOTE — ED Notes (Signed)
Pt's significant other leaving request call when pt gets room.  [place] Fus [phone].

## 2014-06-05 ENCOUNTER — Inpatient Hospital Stay: Payer: BLUE CROSS/BLUE SHIELD

## 2014-06-05 LAB — GLUCOSE, CAPILLARY
GLUCOSE-CAPILLARY: 155 mg/dL — AB (ref 70–99)
GLUCOSE-CAPILLARY: 210 mg/dL — AB (ref 70–99)
GLUCOSE-CAPILLARY: 125 mg/dL — AB (ref 70–99)

## 2014-06-05 MED ORDER — GADOBENATE DIMEGLUMINE 529 MG/ML IV SOLN
20.0000 mL | Freq: Once | INTRAVENOUS | Status: AC | PRN
  Administered 2014-06-05: 20 mL via INTRAVENOUS

## 2014-06-05 MED ORDER — DIAZEPAM 2 MG PO TABS
2.0000 mg | ORAL_TABLET | Freq: Four times a day (QID) | ORAL | Status: DC | PRN
  Administered 2014-06-05 – 2014-06-06 (×3): 2 mg via ORAL
  Filled 2014-06-05 (×3): qty 1

## 2014-06-05 MED ORDER — TRAMADOL HCL 50 MG PO TABS
50.0000 mg | ORAL_TABLET | ORAL | Status: AC
  Administered 2014-06-05: 50 mg via ORAL
  Filled 2014-06-05: qty 1

## 2014-06-05 MED ORDER — FLUOXETINE HCL 20 MG PO CAPS
40.0000 mg | ORAL_CAPSULE | Freq: Every day | ORAL | Status: DC
  Administered 2014-06-05 – 2014-06-07 (×3): 40 mg via ORAL
  Filled 2014-06-05 (×3): qty 2

## 2014-06-05 MED ORDER — DIAZEPAM 2 MG PO TABS
2.0000 mg | ORAL_TABLET | Freq: Once | ORAL | Status: AC
  Administered 2014-06-05: 2 mg via ORAL
  Filled 2014-06-05: qty 1

## 2014-06-05 NOTE — Progress Notes (Signed)
[facility] - [place] at [facility]   PATIENT NAME: Rachel Gilbert    MR#:  [id number]  DATE OF BIRTH:  12-15-1960  SUBJECTIVE:  CHIEF COMPLAINT:   Chief Complaint  Patient presents with  . Headache  . Extremity Weakness   Sitting up, anxious and tearful. Worried about MRI results.  REVIEW OF SYSTEMS:  Review of Systems  Constitutional: Negative for fever.  Eyes: Negative for blurred vision and double vision.  Respiratory: Positive for wheezing. Negative for cough, sputum production and shortness of breath.   Cardiovascular: Negative for chest pain, palpitations and leg swelling.  Gastrointestinal: Negative for heartburn and abdominal pain.  Genitourinary: Negative for dysuria.  Musculoskeletal: Positive for myalgias.  Skin: Negative for rash.  Neurological: Positive for focal weakness and headaches.  Psychiatric/Behavioral: The patient is not nervous/anxious.      DRUG ALLERGIES:   Allergies  Allergen Reactions  . Oxycodone Anaphylaxis    VITALS:  Blood pressure 154/76, pulse 67, temperature 98 F (36.7 C), temperature source Oral, resp. rate 18, height  (1.753 m), weight 122.607 kg (270 lb 4.8 oz), last menstrual period 06/01/2014, SpO2 100 %.  PHYSICAL EXAMINATION:  Physical Exam  Constitutional: She is oriented to person, place, and time. She appears well-developed.  HENT:  Head: Normocephalic and atraumatic.  Eyes: EOM are normal. Pupils are equal, round, and reactive to light.  Neck: Normal range of motion.  Cardiovascular: Normal rate and normal heart sounds.   No murmur heard. Pulmonary/Chest: Effort normal and breath sounds normal.  Abdominal: Soft. Bowel sounds are normal.  Musculoskeletal: Normal range of motion.  Neurological: She is alert and oriented to person, place, and time.  Skin: Skin is warm and dry.     LABORATORY PANEL:   CBC  Recent Labs Lab 06/04/14 0438  WBC 8.0  HGB 15.1  HCT 46.1  PLT 263    ------------------------------------------------------------------------------------------------------------------  Chemistries   Recent Labs Lab 06/04/14 0011 06/04/14 0438  NA 139  --   K 3.2*  --   CL 107  --   CO2 25  --   GLUCOSE 109*  --   BUN 12  --   CREATININE 1.06* 1.14*  CALCIUM 8.8*  --   AST 15  --   ALT 16  --   ALKPHOS 52  --   BILITOT 1.0  --    ------------------------------------------------------------------------------------------------------------------  Cardiac Enzymes No results for input(s): TROPONINI in the last 168 hours. ------------------------------------------------------------------------------------------------------------------  RADIOLOGY:  Dg Chest 2 View  06/04/2014   CLINICAL DATA:  Disoriented.  Fatigue.  EXAM: CHEST  2 VIEW  COMPARISON:  None.  FINDINGS: The cardiac silhouette is enlarged. The lungs are clear. There are no effusions. Pulmonary vasculature is normal.  IMPRESSION: Large cardiac silhouette, probably at least partially due to adipose tissue. Otherwise unremarkable.   Electronically Signed   By: Ellery Plunk M.D.   On: 06/04/2014 01:35   Mr Laqueta Jean FA Contrast  06/05/2014   CLINICAL DATA:  54 year old hypertensive female with history of multiple sclerosis (2012) complaining of disorientation and nausea. Both legs feel heavy. Initial encounter.  EXAM: MRI HEAD WITHOUT AND WITH CONTRAST  MRI CERVICAL SPINE WITHOUT AND WITH CONTRAST  TECHNIQUE: Multiplanar, multiecho pulse sequences of the brain and surrounding structures, and cervical spine, to include the craniocervical junction and cervicothoracic junction, were obtained without and with intravenous contrast.  CONTRAST:  20mL MULTIHANCE GADOBENATE DIMEGLUMINE 529 MG/ML IV SOLN  COMPARISON:  None.  FINDINGS:  MRI HEAD FINDINGS  Exam is motion degraded.  Focal area of restricted motion involving the anterior body of the left aspect of the corpus callosum and left lateral aspect of  the vermis. Given the patient's history and surrounding findings (described below), findings are suspicious for active demyelinating plaques. Although the patient is obese and hypertensive, the involvement of 2 vascular distributions suggests that acute infarcts as cause for regions of restricted motion is a less likely consideration.  Multiple areas of altered signal intensity most prominent periventricular region although also involving the basal ganglia and left thalamus may reflect result of demyelination. Sequelae of prior infarcts/ regions of ischemia contributing to the white matter type changes may contribute to the white matter changes.  White matter type changes central pons have an appearance more suggestive of result of small vessel disease.  No intracranial mass or enhancing lesion.  Scattered blood breakdown products may reflect result of prior hemorrhagic ischemia.  Exophthalmos.  Major intracranial vascular structures are patent.  MRI CERVICAL SPINE FINDINGS  Exam is motion degraded.  No focal cervical cord signal abnormality or enhancement to suggest demyelinating plaque involving the cervical cord.  C2-3 thru C4-5 without significant abnormality.  C5-6: Shallow broad-based disc osteophyte greater to left with mild narrowing ventral aspect of the thecal sac greater on the left. Uncinate hypertrophy. Mild slightly moderate foraminal narrowing greater on the left.  C6-7: Shallow broad-based disc osteophyte. Mild narrowing ventral aspect of the thecal sac. Uncinate hypertrophy with mild foraminal narrowing.  C7-T1:  Mild facet joint degenerative changes.  IMPRESSION: MRI HEAD  Active demyelinating plaques suspected involving the anterior body of the left aspect of the corpus callosum and left lateral aspect of the vermis. Although the patient is obese and hypertensive, the involvement of 2 vascular distributions suggests that acute infarcts as cause for regions of restricted motion is a secondary less  likely consideration.  Prominent white matter type changes most notable periventricular region may represent combination of result of multiple sclerosis and prior ischemia/ infarcts. Demyelinating process felt to be the dominant cause of white matter changes.  Scattered blood breakdown products may reflect result of prior hemorrhagic ischemia.  Exophthalmos.  MRI CERVICAL SPINE  Exam is motion degraded.  No focal cervical cord signal abnormality or enhancement to suggest demyelinating plaque involving the cervical cord.  Cervical spondylotic changes most notable C5-6 followed by the C6-7 level as detailed above.   Electronically Signed   By: Lacy Duverney M.D.   On: 06/05/2014 11:12   Mr Cervical Spine W Wo Contrast  06/05/2014   CLINICAL DATA:  54 year old hypertensive female with history of multiple sclerosis (2012) complaining of disorientation and nausea. Both legs feel heavy. Initial encounter.  EXAM: MRI HEAD WITHOUT AND WITH CONTRAST  MRI CERVICAL SPINE WITHOUT AND WITH CONTRAST  TECHNIQUE: Multiplanar, multiecho pulse sequences of the brain and surrounding structures, and cervical spine, to include the craniocervical junction and cervicothoracic junction, were obtained without and with intravenous contrast.  CONTRAST:  14mL MULTIHANCE GADOBENATE DIMEGLUMINE 529 MG/ML IV SOLN  COMPARISON:  None.  FINDINGS: MRI HEAD FINDINGS  Exam is motion degraded.  Focal area of restricted motion involving the anterior body of the left aspect of the corpus callosum and left lateral aspect of the vermis. Given the patient's history and surrounding findings (described below), findings are suspicious for active demyelinating plaques. Although the patient is obese and hypertensive, the involvement of 2 vascular distributions suggests that acute infarcts as cause for regions of restricted  motion is a less likely consideration.  Multiple areas of altered signal intensity most prominent periventricular region although also  involving the basal ganglia and left thalamus may reflect result of demyelination. Sequelae of prior infarcts/ regions of ischemia contributing to the white matter type changes may contribute to the white matter changes.  White matter type changes central pons have an appearance more suggestive of result of small vessel disease.  No intracranial mass or enhancing lesion.  Scattered blood breakdown products may reflect result of prior hemorrhagic ischemia.  Exophthalmos.  Major intracranial vascular structures are patent.  MRI CERVICAL SPINE FINDINGS  Exam is motion degraded.  No focal cervical cord signal abnormality or enhancement to suggest demyelinating plaque involving the cervical cord.  C2-3 thru C4-5 without significant abnormality.  C5-6: Shallow broad-based disc osteophyte greater to left with mild narrowing ventral aspect of the thecal sac greater on the left. Uncinate hypertrophy. Mild slightly moderate foraminal narrowing greater on the left.  C6-7: Shallow broad-based disc osteophyte. Mild narrowing ventral aspect of the thecal sac. Uncinate hypertrophy with mild foraminal narrowing.  C7-T1:  Mild facet joint degenerative changes.  IMPRESSION: MRI HEAD  Active demyelinating plaques suspected involving the anterior body of the left aspect of the corpus callosum and left lateral aspect of the vermis. Although the patient is obese and hypertensive, the involvement of 2 vascular distributions suggests that acute infarcts as cause for regions of restricted motion is a secondary less likely consideration.  Prominent white matter type changes most notable periventricular region may represent combination of result of multiple sclerosis and prior ischemia/ infarcts. Demyelinating process felt to be the dominant cause of white matter changes.  Scattered blood breakdown products may reflect result of prior hemorrhagic ischemia.  Exophthalmos.  MRI CERVICAL SPINE  Exam is motion degraded.  No focal cervical cord  signal abnormality or enhancement to suggest demyelinating plaque involving the cervical cord.  Cervical spondylotic changes most notable C5-6 followed by the C6-7 level as detailed above.   Electronically Signed   By: Lacy Duverney M.D.   On: 06/05/2014 11:12    EKG:   Orders placed or performed during the hospital encounter of 06/03/14  . ED EKG  . ED EKG    ASSESSMENT AND PLAN:   1) Acute exacerbation of multiple sclerosis - lower extremity weakness and fatigue - appreciate neurology consultation - continue with solumedrol - will need a local neurologist for outpatient follow up - MRI brain and spinal cord negative, await further neuro recs - PT/OT eval and treat  2) anxiety - Restart SSRI, valium prn     All the records are reviewed and case discussed with Care Management/Social Workerr. Management plans discussed with the patient, family and they are in agreement.  CODE STATUS: full  TOTAL TIME TAKING CARE OF THIS PATIENT: 35 minutes.   POSSIBLE D/C IN 3 DAYS, DEPENDING ON CLINICAL CONDITION.   Elby Showers M.D on 06/05/2014 at 1:45 PM  Between 7am to 6pm - Pager - [phone]  After 6pm go to [email or web address] - password [email or web address] [facility]  [place] [facility]  Office  [phone]  CC: Primary care physician; No primary care provider on file.

## 2014-06-05 NOTE — Consult Note (Signed)
CC: Lower extremity weakness.   HPI: Rachel Gilbert is an 54 y.o. female with hx of MS diagnosed in 2012. Last exacerbation about 1.5 yrs ago with generalized weakness. At that time pt was given solumedrol IV. On gillenia and amphyra daily. Since Thursday pt has been having difficulty ambulating with her legs giving out.  Last MS exacerbation pt had new pontine lesions.   Past Medical History  Diagnosis Date  . Multiple sclerosis   . Hypertension     Past Surgical History  Procedure Laterality Date  . Cesarean section  1986    Family History  Problem Relation Age of Onset  . Stroke Other     Social History:  reports that she has never smoked. She has never used smokeless tobacco. She reports that she drinks alcohol. She reports that she does not use illicit drugs.  Allergies  Allergen Reactions  . Oxycodone Anaphylaxis    Medications: I have reviewed the patient's current medications.  ROS: History obtained from the patient  General ROS: negative for - chills, fatigue, fever, night sweats, weight gain or weight loss Psychological ROS: negative for - behavioral disorder, hallucinations, memory difficulties, mood swings or suicidal ideation Ophthalmic ROS: negative for - blurry vision, double vision, eye pain or loss of vision ENT ROS: negative for - epistaxis, nasal discharge, oral lesions, sore throat, tinnitus or vertigo Allergy and Immunology ROS: negative for - hives or itchy/watery eyes Hematological and Lymphatic ROS: negative for - bleeding problems, bruising or swollen lymph nodes Endocrine ROS: negative for - galactorrhea, hair pattern changes, polydipsia/polyuria or temperature intolerance Respiratory ROS: negative for - cough, hemoptysis, shortness of breath or wheezing Cardiovascular ROS: negative for - chest pain, dyspnea on exertion, edema or irregular heartbeat Gastrointestinal ROS: negative for - abdominal pain, diarrhea, hematemesis, nausea/vomiting or stool  incontinence Genito-Urinary ROS: negative for - dysuria, hematuria, incontinence or urinary frequency/urgency Musculoskeletal ROS: negative for - joint swelling or muscular weakness Neurological ROS: as noted in HPI Dermatological ROS: negative for rash and skin lesion changes  Physical Examination: Blood pressure 154/76, pulse 67, temperature 98 F (36.7 C), temperature source Oral, resp. rate 18, height 5\' 9"  (1.753 m), weight 122.607 kg (270 lb 4.8 oz), last menstrual period 06/01/2014, SpO2 100 %.  HEENT-  Normocephalic, no lesions, without obvious abnormality.  Normal external eye and conjunctiva.  Normal TM's bilaterally.  Normal auditory canals and external ears. Normal external nose, mucus membranes and septum.  Normal pharynx. Cardiovascular- regular rate and rhythm, S1, S2 normal, no murmur, click, rub or gallop, pulses palpable throughout   Lungs- Heart exam - S1, S2 normal, no murmur, no gallop, rate regular Abdomen- soft, non-tender; bowel sounds normal; no masses,  no organomegaly Extremities- no edema Lymph-no adenopathy palpable Musculoskeletal-no joint tenderness, deformity or swelling Skin-warm and dry, no hyperpigmentation, vitiligo, or suspicious lesions  Neurological Examination Mental Status: Alert, oriented, thought content appropriate.  Speech fluent without evidence of aphasia.  Able to follow 3 step commands without difficulty. Cranial Nerves: II: Discs flat bilaterally; Visual fields grossly normal, pupils equal, round, reactive to light and accommodation III,IV, VI: ptosis not present, extra-ocular motions intact bilaterally V,VII: smile symmetric, facial light touch sensation normal bilaterally VIII: hearing normal bilaterally IX,X: gag reflex present XI: bilateral shoulder shrug XII: midline tongue extension Motor: Right : Upper extremity   5/5    Left:     Upper extremity   5/5  Lower extremity   4/5     Lower extremity  4/5 Tone and bulk:normal tone  throughout; no atrophy noted Sensory: Pinprick and light touch intact throughout, bilaterally Deep Tendon Reflexes: 2+ and symmetric throughout Plantars: Right: downgoing   Left: downgoing Cerebellar: normal finger-to-nose, normal rapid alternating movements and normal heel-to-shin test Gait:not examined       Laboratory Studies:   Basic Metabolic Panel:  Recent Labs Lab 06/04/14 0011 06/04/14 0438  NA 139  --   K 3.2*  --   CL 107  --   CO2 25  --   GLUCOSE 109*  --   BUN 12  --   CREATININE 1.06* 1.14*  CALCIUM 8.8*  --     Liver Function Tests:  Recent Labs Lab 06/04/14 0011  AST 15  ALT 16  ALKPHOS 52  BILITOT 1.0  PROT 6.6  ALBUMIN 3.8   No results for input(s): LIPASE, AMYLASE in the last 168 hours. No results for input(s): AMMONIA in the last 168 hours.  CBC:  Recent Labs Lab 06/04/14 0011 06/04/14 0438  WBC 8.9 8.0  NEUTROABS 7.6*  --   HGB 14.5 15.1  HCT 44.2 46.1  MCV 89.2 90.7  PLT 274 263    Cardiac Enzymes: No results for input(s): CKTOTAL, CKMB, CKMBINDEX, TROPONINI in the last 168 hours.  BNP: Invalid input(s): POCBNP  CBG:  Recent Labs Lab 06/04/14 1107 06/04/14 1609 06/04/14 1936 06/04/14 2145 06/05/14 1055  GLUCAP 155* 174* 158* 147* 155*    Microbiology: No results found for this or any previous visit.  Coagulation Studies: No results for input(s): LABPROT, INR in the last 72 hours.  Urinalysis:   Recent Labs Lab 06/04/14 1744  COLORURINE YELLOW*  LABSPEC 1.029  PHURINE 5.0  GLUCOSEU 50*  HGBUR 2+*  BILIRUBINUR NEGATIVE  KETONESUR NEGATIVE  PROTEINUR 30*  NITRITE NEGATIVE  LEUKOCYTESUR NEGATIVE    Lipid Panel:  No results found for: CHOL, TRIG, HDL, CHOLHDL, VLDL, LDLCALC  HgbA1C: No results found for: HGBA1C  Urine Drug Screen:  No results found for: LABOPIA, COCAINSCRNUR, LABBENZ, AMPHETMU, THCU, LABBARB  Alcohol Level: No results for input(s): ETH in the last 168 hours.  Other  results: EKG: normal EKG, normal sinus rhythm, unchanged from previous tracings.  Imaging: Dg Chest 2 View  06/04/2014   CLINICAL DATA:  Disoriented.  Fatigue.  EXAM: CHEST  2 VIEW  COMPARISON:  None.  FINDINGS: The cardiac silhouette is enlarged. The lungs are clear. There are no effusions. Pulmonary vasculature is normal.  IMPRESSION: Large cardiac silhouette, probably at least partially due to adipose tissue. Otherwise unremarkable.   Electronically Signed   By: Ellery Plunk M.D.   On: 06/04/2014 01:35   Mr Laqueta Jean ZO Contrast  06/05/2014   CLINICAL DATA:  54 year old hypertensive female with history of multiple sclerosis (2012) complaining of disorientation and nausea. Both legs feel heavy. Initial encounter.  EXAM: MRI HEAD WITHOUT AND WITH CONTRAST  MRI CERVICAL SPINE WITHOUT AND WITH CONTRAST  TECHNIQUE: Multiplanar, multiecho pulse sequences of the brain and surrounding structures, and cervical spine, to include the craniocervical junction and cervicothoracic junction, were obtained without and with intravenous contrast.  CONTRAST:  20mL MULTIHANCE GADOBENATE DIMEGLUMINE 529 MG/ML IV SOLN  COMPARISON:  None.  FINDINGS: MRI HEAD FINDINGS  Exam is motion degraded.  Focal area of restricted motion involving the anterior body of the left aspect of the corpus callosum and left lateral aspect of the vermis. Given the patient's history and surrounding findings (described below), findings are suspicious for active demyelinating plaques.  Although the patient is obese and hypertensive, the involvement of 2 vascular distributions suggests that acute infarcts as cause for regions of restricted motion is a less likely consideration.  Multiple areas of altered signal intensity most prominent periventricular region although also involving the basal ganglia and left thalamus may reflect result of demyelination. Sequelae of prior infarcts/ regions of ischemia contributing to the white matter type changes may  contribute to the white matter changes.  White matter type changes central pons have an appearance more suggestive of result of small vessel disease.  No intracranial mass or enhancing lesion.  Scattered blood breakdown products may reflect result of prior hemorrhagic ischemia.  Exophthalmos.  Major intracranial vascular structures are patent.  MRI CERVICAL SPINE FINDINGS  Exam is motion degraded.  No focal cervical cord signal abnormality or enhancement to suggest demyelinating plaque involving the cervical cord.  C2-3 thru C4-5 without significant abnormality.  C5-6: Shallow broad-based disc osteophyte greater to left with mild narrowing ventral aspect of the thecal sac greater on the left. Uncinate hypertrophy. Mild slightly moderate foraminal narrowing greater on the left.  C6-7: Shallow broad-based disc osteophyte. Mild narrowing ventral aspect of the thecal sac. Uncinate hypertrophy with mild foraminal narrowing.  C7-T1:  Mild facet joint degenerative changes.  IMPRESSION: MRI HEAD  Active demyelinating plaques suspected involving the anterior body of the left aspect of the corpus callosum and left lateral aspect of the vermis. Although the patient is obese and hypertensive, the involvement of 2 vascular distributions suggests that acute infarcts as cause for regions of restricted motion is a secondary less likely consideration.  Prominent white matter type changes most notable periventricular region may represent combination of result of multiple sclerosis and prior ischemia/ infarcts. Demyelinating process felt to be the dominant cause of white matter changes.  Scattered blood breakdown products may reflect result of prior hemorrhagic ischemia.  Exophthalmos.  MRI CERVICAL SPINE  Exam is motion degraded.  No focal cervical cord signal abnormality or enhancement to suggest demyelinating plaque involving the cervical cord.  Cervical spondylotic changes most notable C5-6 followed by the C6-7 level as detailed  above.   Electronically Signed   By: Lacy Duverney M.D.   On: 06/05/2014 11:12   Mr Cervical Spine W Wo Contrast  06/05/2014   CLINICAL DATA:  54 year old hypertensive female with history of multiple sclerosis (2012) complaining of disorientation and nausea. Both legs feel heavy. Initial encounter.  EXAM: MRI HEAD WITHOUT AND WITH CONTRAST  MRI CERVICAL SPINE WITHOUT AND WITH CONTRAST  TECHNIQUE: Multiplanar, multiecho pulse sequences of the brain and surrounding structures, and cervical spine, to include the craniocervical junction and cervicothoracic junction, were obtained without and with intravenous contrast.  CONTRAST:  20mL MULTIHANCE GADOBENATE DIMEGLUMINE 529 MG/ML IV SOLN  COMPARISON:  None.  FINDINGS: MRI HEAD FINDINGS  Exam is motion degraded.  Focal area of restricted motion involving the anterior body of the left aspect of the corpus callosum and left lateral aspect of the vermis. Given the patient's history and surrounding findings (described below), findings are suspicious for active demyelinating plaques. Although the patient is obese and hypertensive, the involvement of 2 vascular distributions suggests that acute infarcts as cause for regions of restricted motion is a less likely consideration.  Multiple areas of altered signal intensity most prominent periventricular region although also involving the basal ganglia and left thalamus may reflect result of demyelination. Sequelae of prior infarcts/ regions of ischemia contributing to the white matter type changes may contribute to the  white matter changes.  White matter type changes central pons have an appearance more suggestive of result of small vessel disease.  No intracranial mass or enhancing lesion.  Scattered blood breakdown products may reflect result of prior hemorrhagic ischemia.  Exophthalmos.  Major intracranial vascular structures are patent.  MRI CERVICAL SPINE FINDINGS  Exam is motion degraded.  No focal cervical cord signal  abnormality or enhancement to suggest demyelinating plaque involving the cervical cord.  C2-3 thru C4-5 without significant abnormality.  C5-6: Shallow broad-based disc osteophyte greater to left with mild narrowing ventral aspect of the thecal sac greater on the left. Uncinate hypertrophy. Mild slightly moderate foraminal narrowing greater on the left.  C6-7: Shallow broad-based disc osteophyte. Mild narrowing ventral aspect of the thecal sac. Uncinate hypertrophy with mild foraminal narrowing.  C7-T1:  Mild facet joint degenerative changes.  IMPRESSION: MRI HEAD  Active demyelinating plaques suspected involving the anterior body of the left aspect of the corpus callosum and left lateral aspect of the vermis. Although the patient is obese and hypertensive, the involvement of 2 vascular distributions suggests that acute infarcts as cause for regions of restricted motion is a secondary less likely consideration.  Prominent white matter type changes most notable periventricular region may represent combination of result of multiple sclerosis and prior ischemia/ infarcts. Demyelinating process felt to be the dominant cause of white matter changes.  Scattered blood breakdown products may reflect result of prior hemorrhagic ischemia.  Exophthalmos.  MRI CERVICAL SPINE  Exam is motion degraded.  No focal cervical cord signal abnormality or enhancement to suggest demyelinating plaque involving the cervical cord.  Cervical spondylotic changes most notable C5-6 followed by the C6-7 level as detailed above.   Electronically Signed   By: Lacy Duverney M.D.   On: 06/05/2014 11:12     Assessment/Plan: hx of MS diagnosed in 2012. Last exacerbation about 1.5 yrs ago with generalized weakness. At that time pt was given solumedrol IV. On gillenia and amphyra daily. Since Thursday pt has been having diffuculty ambulating with her legs giving out.  Last MS exacerbation pt had new pontine lesions.   MRI findings as above pt does  have active demyelinating plaques suspected involving the anterior body of the left aspect of the corpus callosum and left lateral aspect of the vermis. No acute findings on the cervical spine.    Plan - continue solumedrol 1gm IV daily x3 days today is day 2 - Pt/OT - f/u out pt neurologist after d/c.  - pt has been having "personality changes/mood swings" which could be due to high dose steroids.  If persist can change tomorrow's dose to 500mg  BID rather then giving 1gram at once.   Rachel Gilbert    06/05/2014, 1:22 PM

## 2014-06-05 NOTE — Care Management Note (Signed)
Case Management Note  Patient Details  Name: Rachel Gilbert MRN: [id number] Date of Birth: 10-07-1960  Subjective/Objective:                  Patient was off unit for MRI. Boyfriend Tommy at bedside- [phone]. He states they both just moved here from [place] where her neurologist and PCP is located. They are working on arranging a more local neurologist/PCP in the [place] area. He states "she's been fine up until the last few days". He states patient uses a cane to ambulate but he purchased a wheelchair in the even it is needed.   Action/Plan: RNCM will continue to follow for discharge needs.   Expected Discharge Date:                  Expected Discharge Plan:     In-House Referral:  NA  Discharge planning Services  CM Consult  Post Acute Care Choice:    Choice offered to:     DME Arranged:    DME Agency:     HH Arranged:  NA HH Agency:  NA  Status of Service:  In process, will continue to follow  Medicare Important Message Given:    Date Medicare IM Given:    Medicare IM give by:    Date Additional Medicare IM Given:    Additional Medicare Important Message give by:     If discussed at Long Length of Stay Meetings, dates discussed:    Additional Comments:  Collie Siad, RN 06/05/2014, 11:09 AM

## 2014-06-06 LAB — GLUCOSE, CAPILLARY
GLUCOSE-CAPILLARY: 114 mg/dL — AB (ref 70–99)
Glucose-Capillary: 148 mg/dL — ABNORMAL HIGH (ref 70–99)
Glucose-Capillary: 117 mg/dL — ABNORMAL HIGH (ref 70–99)
Glucose-Capillary: 190 mg/dL — ABNORMAL HIGH (ref 70–99)

## 2014-06-06 LAB — BASIC METABOLIC PANEL
Anion gap: 7 (ref 5–15)
BUN: 24 mg/dL — ABNORMAL HIGH (ref 6–20)
CO2: 22 mmol/L (ref 22–32)
CREATININE: 1.19 mg/dL — AB (ref 0.44–1.00)
Calcium: 8.6 mg/dL — ABNORMAL LOW (ref 8.9–10.3)
Chloride: 110 mmol/L (ref 101–111)
GFR calc Af Amer: 60 mL/min — ABNORMAL LOW (ref >60)
GFR calc non Af Amer: 52 mL/min — ABNORMAL LOW (ref >60)
Glucose, Bld: 136 mg/dL — ABNORMAL HIGH (ref 65–99)
Potassium: 4.1 mmol/L (ref 3.5–5.1)
Sodium: 139 mmol/L (ref 135–145)

## 2014-06-06 LAB — CBC
HCT: 42.4 % (ref 35.0–47.0)
HEMOGLOBIN: 13.7 g/dL (ref 12.0–16.0)
MCH: 29.4 pg (ref 26.0–34.0)
MCHC: 32.4 g/dL (ref 32.0–36.0)
MCV: 90.6 fL (ref 80.0–100.0)
PLATELETS: 230 10*3/uL (ref 150–440)
RBC: 4.67 MIL/uL (ref 3.80–5.20)
RDW: 14 % (ref 11.5–14.5)
WBC: 11 10*3/uL (ref 3.6–11.0)

## 2014-06-06 NOTE — Evaluation (Signed)
Occupational Therapy Evaluation Patient Details Name: Sharlotte Baka MRN: [id number] DOB: 10/20/1960 Today's Date: 06/06/2014    History of Present Illness presented to ER with 3-4 day history of progressive LE weakness, fatigue; admitted with acute MS exacerbation.  Treating with IV solu-medrol; patient reporting improvement in strength since treatment.  Per neuro notes, patient with "active demyelinating plaques over the anterior body of L aspect of the corpus callosum and L lateral aspect of vermis; no acute c-spine findings".  Of note, patient initially diagnosed with MS in 2012 and has 3 exacerbations with resulting hospitalizations since diagnosis (last flare approx 1.5 years ago)   Clinical Impression   Patient seen for assessment of ADLs including ambulation to toilet.  She stated that she feels a strong urge to go the bathroom but then hardly anything comes out when needing to urinate.  Plan to discuss with nursing but pt was evaluated for UTI upon admission.  She is able to perform functional mobility during ADLs without any AD but balance decreased when leaning forward on toilet and when leaning to feet for LB dressing.  She also stated she has difficulty opening pill bottles and soda bottles and would benefit from use of dycem which will be issued by therapist later today including a catalog of other AD that could help her with ADLs.  Rec continued OT to address other ADL issues and to ensure status does not decline with will affect independence in ADLs and continue education in energy conservation techniques.    Follow Up Recommendations  No OT follow up    Equipment Recommendations   (pt to be given dycem and catalog with AD resources for ADLs)    Recommendations for Other Services       Precautions / Restrictions Precautions Precautions: Fall Restrictions Weight Bearing Restrictions: No      Mobility Bed Mobility Overal bed mobility: Independent                 Transfers Overall transfer level: Modified independent               General transfer comment:  (use of bilat UEs to assist with elevation)    Balance                                      ADL Overall ADL's : Needs assistance/impaired Eating/Feeding: Independent   Grooming: Wash/dry hands;Wash/dry face;Applying deodorant;Oral care;Brushing hair;Independent   Upper Body Bathing: Independent   Lower Body Bathing: Min guard Lower Body Bathing Details (indicate cue type and reason): decreased dynamic balance when reaching forward to feet Upper Body Dressing : Independent   Lower Body Dressing: Min guard Lower Body Dressing Details (indicate cue type and reason): decreased dynamic balance when reaching forward to feet Toilet Transfer: Min Pension scheme manager Details (indicate cue type and reason): decreased dynamic balance when leaning forward off toilet Toileting- Clothing Manipulation and Hygiene: Supervision/safety         General ADL Comments: Pt is able to complete ADLs with supervision to min assist for dynamic balance for LB dressing and toileting      Vision     Perception     Praxis      Pertinent Vitals/Pain Pain Assessment: No/denies pain     Hand Dominance Right   Extremity/Trunk Assessment Upper Extremity Assessment Upper Extremity Assessment: Overall WFL for tasks assessed   Lower Extremity Assessment Lower Extremity  Assessment: Defer to PT evaluation   Cervical / Trunk Assessment Cervical / Trunk Assessment:  (forward head, rounded shoulders)   Communication Communication Communication: No difficulties   Cognition Arousal/Alertness: Awake/alert Behavior During Therapy: WFL for tasks assessed/performed Overall Cognitive Status: Within Functional Limits for tasks assessed       Memory: Decreased short-term memory             General Comments       Exercises   Other Exercises Other Exercises: Gait x100'  with RW, cga/close sup--minimal change in gait pattern with use of assist device; minimal functional use of device noted.  Patient feels she would not be able to utilize RW in apartment. Will plan to trial with [facility] next session. Other Exercises: Toilet transfer, ambulatory without assist device, sup/mod indep.  Able to complete sit/stand from standard toilet without difficulty; minimal use of UEs to assist.   Shoulder Instructions      Home Living Family/patient expects to be discharged to:: Private residence Living Arrangements: Spouse/significant other Available Help at Discharge: Family Type of Home: Apartment Home Access: Stairs to enter Secretary/administrator of Steps: 1 Entrance Stairs-Rails: None Home Layout: Two level;Bed/bath upstairs Alternate Level Stairs-Number of Steps: 12 Alternate Level Stairs-Rails: Can reach both           Home Equipment: Cane - single point          Prior Functioning/Environment Level of Independence: Independent with assistive device(s)        Comments: mod indep with SPC for ADLs, household mobility/activities and limited community distances.  + driving.  Denies recent fall history (three since diagnosis in 2012)    OT Diagnosis: Generalized weakness   OT Problem List: Decreased strength;Decreased activity tolerance   OT Treatment/Interventions: Self-care/ADL training;Therapeutic exercise;Energy conservation;DME and/or AE instruction    OT Goals(Current goals can be found in the care plan section) Acute Rehab OT Goals Patient Stated Goal: to be able to open pill bottles and soda bottles easier OT Goal Formulation: With patient Time For Goal Achievement: 06/20/14 Potential to Achieve Goals: Good  OT Frequency: Min 1X/week   Barriers to D/C:            Co-evaluation              End of Session    Activity Tolerance: Patient limited by fatigue Patient left: in bed;with call bell/phone within reach;with bed alarm set    Time: 1020-1100 OT Time Calculation (min): 40 min Charges:  OT General Charges $OT Visit: 1 Procedure OT Evaluation $Initial OT Evaluation Tier I: 1 Procedure OT Treatments $Self Care/Home Management : 23-37 mins G-Codes:    Haylyn Halberg 06/11/14, 1:14 PM    Susanne Borders, OTR/L

## 2014-06-06 NOTE — Outcomes Assessment (Signed)
VSS, patient is A+O with no signs of distress. Pain is controlled without the use of prn pain meds.  Anxiety medication administered PRN.  IV fluids infusing and cpap at night. Voids without difficulty.

## 2014-06-06 NOTE — Progress Notes (Signed)
Occupational Therapy Treatment Patient Details Name: Alleen Kehm MRN: [id number] DOB: [birth year] Today's Date: 06/06/2014    History of present illness presented to ER with 3-4 day history of progressive LE weakness, fatigue; admitted with acute MS exacerbation.  Treating with IV solu-medrol; patient reporting improvement in strength since treatment.  Per neuro notes, patient with "active demyelinating plaques over the anterior body of L aspect of the corpus callosum and L lateral aspect of vermis; no acute c-spine findings".  Of note, patient initially diagnosed with MS in 2012 and has 3 exacerbations with resulting hospitalizations since diagnosis (last flare approx 1.5 years ago)   OT comments  Pt seen in room for education in use of dycem for ADLs and reviewed adaptive equipment catalog for other recommendations for use at home.  Pt verbalized understanding on how to use dycem and clean it.   Follow Up Recommendations  No OT follow up    Equipment Recommendations   (pt to be given dycem and catalog with AD resources for ADLs)    Recommendations for Other Services      Precautions / Restrictions Precautions Precautions: Fall Restrictions Weight Bearing Restrictions: No       Mobility Bed Mobility Overal bed mobility: Independent                Transfers Overall transfer level: Modified independent                    Balance                                   ADL Overall ADL's : Needs assistance/impaired Eating/Feeding: Independent   Grooming: Wash/dry hands;Wash/dry face;Applying deodorant;Oral care;Brushing hair;Independent   Upper Body Bathing: Independent   Lower Body Bathing: Min guard Lower Body Bathing Details (indicate cue type and reason): decreased dynamic balance when reaching forward to feet Upper Body Dressing : Independent   Lower Body Dressing: Min guard Lower Body Dressing Details (indicate cue type and reason): decreased  dynamic balance when reaching forward to feet Toilet Transfer: Min Pension scheme manager Details (indicate cue type and reason): decreased dynamic balance when leaning forward off toilet Toileting- Clothing Manipulation and Hygiene: Supervision/safety         General ADL Comments: Pt given dycem to use to hold prescription bottle top and bottom to help open bottle and to open other bottles.  Reviewed catalog of other AD to help at home and gave pt catalog.      Vision                     Perception     Praxis      Cognition   Behavior During Therapy: WFL for tasks assessed/performed Overall Cognitive Status: Within Functional Limits for tasks assessed       Memory: Decreased short-term memory               Extremity/Trunk Assessment  Upper Extremity Assessment Upper Extremity Assessment: Overall WFL for tasks assessed   Lower Extremity Assessment Lower Extremity Assessment: Defer to PT evaluation        Exercises     Shoulder Instructions       General Comments      Pertinent Vitals/ Pain       Pain Assessment: No/denies pain  Home Living Family/patient expects to be discharged to:: Private residence Living Arrangements: Spouse/significant other Available Help at  Discharge: Family Type of Home: Apartment Home Access: Stairs to enter Secretary/administrator of Steps: 1 Entrance Stairs-Rails: None Home Layout: Two level;Bed/bath upstairs Alternate Level Stairs-Number of Steps: 12 Alternate Level Stairs-Rails: Can reach both           Home Equipment: Cane - single point          Prior Functioning/Environment Level of Independence: Independent with assistive device(s)            Frequency Min 1X/week     Progress Toward Goals  OT Goals(current goals can now be found in the care plan section)  Progress towards OT goals: Progressing toward goals  Acute Rehab OT Goals Patient Stated Goal: to be able to open pill bottles and  soda bottles easier OT Goal Formulation: With patient Time For Goal Achievement: 06/20/14 Potential to Achieve Goals: Good ADL Goals Pt Will Perform Lower Body Bathing: Independently Pt Will Perform Lower Body Dressing: Independently Pt Will Transfer to Toilet: Independently Pt Will Perform Toileting - Clothing Manipulation and hygiene: Independently  Plan Discharge plan remains appropriate    Co-evaluation                 End of Session Equipment Utilized During Treatment:  (dycem)   Activity Tolerance Patient limited by fatigue   Patient Left in bed;with call bell/phone within reach;with bed alarm set   Nurse Communication          Time: 1545-1600 OT Time Calculation (min): 15 min  Charges: OT General Charges $OT Visit: 1 Procedure OT Evaluation $Initial OT Evaluation Tier I: 1 Procedure OT Treatments $Self Care/Home Management : 8-22 mins  Owain Eckerman 06/06/2014, 4:10 PM    Susanne Borders, OTR/L

## 2014-06-06 NOTE — Evaluation (Signed)
Physical Therapy Evaluation Patient Details Name: Rachel Gilbert MRN: [id number] DOB: 10/19/1960 Today's Date: 06/06/2014   History of Present Illness  presented to ER with 3-4 day history of progressive LE weakness, fatigue; admitted with acute MS exacerbation.  Treating with IV solu-medrol; patient reporting improvement in strength since treatment.  Per neuro notes, patient with "active demyelinating plaques over the anterior body of L aspect of the corpus callosum and L lateral aspect of vermis; no acute c-spine findings".  Of note, patient initially diagnosed with MS in 2012 and has 3 exacerbations with resulting hospitalizations since diagnosis (last flare approx 1.5 years ago)  Clinical Impression  Upon PT evaluation, patient alert and oriented to all information; follows all commands and demonstrates good insight/safety awareness.  Emotionally labile, frequently tearful throughout evaluation.  Reports generalized weakness in bilat LEs associated with this flare, but improved since admission.  Bilat UEs 4+/5, R LE 4+/5, L LE 4-/5; all functional for basic transfers/mobility.  No significant tonal changes appreciated; no sensory deficits reported.  Currently able to complete bed mobility and basic transfers without assist device, mod indep; gait x20' without assist device, cga.  Minimal change with use of RW; may plan to trial [facility] next session. Would benefit from skilled PT to address above deficits and promote optimal return to PLOF; recommend transition to home with outpatient PT services; may benefit from facility with access to aquatic therapy.  Patient aware of recommendations and in agreement with plan.     Follow Up Recommendations Outpatient PT (would benefit from facility with access to aquatic therapy)    Equipment Recommendations       Recommendations for Other Services       Precautions / Restrictions Precautions Precautions: Fall      Mobility  Bed Mobility Overal bed  mobility: Independent                Transfers Overall transfer level: Modified independent               General transfer comment:  (use of bilat UEs to assist with elevation)  Ambulation/Gait Ambulation/Gait assistance: Min guard Ambulation Distance (Feet): 15 Feet         General Gait Details: reciprocal stepping pattern with decreased heel strike/toe off (L > R), excessive lateral sway; reaching for walls/furniture for external stabilization.  Stairs            Wheelchair Mobility    Modified Rankin (Stroke Patients Only)       Balance Overall balance assessment: Needs assistance Sitting-balance support: No upper extremity supported Sitting balance-Leahy Scale: Normal     Standing balance support: No upper extremity supported Standing balance-Leahy Scale: Fair Standing balance comment: decreased reaction time and ability to respond to external perturbations                             Pertinent Vitals/Pain Pain Assessment: No/denies pain    Home Living Family/patient expects to be discharged to:: Private residence Living Arrangements: Spouse/significant other Available Help at Discharge: Family Type of Home: Apartment Home Access: Stairs to enter Entrance Stairs-Rails: None Entrance Stairs-Number of Steps: 1 Home Layout: Two level;Bed/bath upstairs (typically comes downstairs once ready for the day and returns prior to bedtime; stays downstairs (with 1/2 bath) during day) Home Equipment: Cane - single point      Prior Function Level of Independence: Independent with assistive device(s)         Comments:  mod indep with SPC for ADLs, household mobility/activities and limited community distances.  + driving.  Denies recent fall history (three since diagnosis in 2012)     Hand Dominance   Dominant Hand: Right    Extremity/Trunk Assessment   Upper Extremity Assessment: Overall WFL for tasks assessed (grossly at least 4+/5  throughout; denies sensory deficit)           Lower Extremity Assessment: Generalized weakness (ROM grossly WFL and symmetrical; R LE 4+/5, L LE 4-/5.  Denies sensory deficit.  No significatn changes in tone appreciated.)      Cervical / Trunk Assessment:  (forward head, rounded shoulders)  Communication   Communication: No difficulties  Cognition Arousal/Alertness: Awake/alert Behavior During Therapy: WFL for tasks assessed/performed Overall Cognitive Status: Within Functional Limits for tasks assessed       Memory: Decreased short-term memory              General Comments      Exercises Other Exercises Other Exercises: Gait x100' with RW, cga/close sup--minimal change in gait pattern with use of assist device; minimal functional use of device noted.  Patient feels she would not be able to utilize RW in apartment. Will plan to trial with [facility] next session. Other Exercises: Toilet transfer, ambulatory without assist device, sup/mod indep.  Able to complete sit/stand from standard toilet without difficulty; minimal use of UEs to assist. (12 minutes)     Assessment/Plan    PT Assessment Patient needs continued PT services  PT Diagnosis Abnormality of gait;Generalized weakness   PT Problem List Decreased strength;Decreased activity tolerance;Decreased balance;Decreased mobility;Decreased coordination;Decreased cognition;Obesity  PT Treatment Interventions DME instruction;Gait training;Stair training;Functional mobility training;Therapeutic activities;Therapeutic exercise;Balance training;Neuromuscular re-education;Cognitive remediation;Patient/family education   PT Goals (Current goals can be found in the Care Plan section) Acute Rehab PT Goals Patient Stated Goal: "to get better" PT Goal Formulation: With patient Time For Goal Achievement: 06/20/14 Potential to Achieve Goals: Good Additional Goals Additional Goal #2: Up/down 12 steps with bilat rails, sup, for access to  bed/bathrooom in home environment.    Frequency 7X/week   Barriers to discharge        Co-evaluation               End of Session Equipment Utilized During Treatment: Gait belt Activity Tolerance: Patient tolerated treatment well Patient left: in bed;with call bell/phone within reach;with bed alarm set           Time: 1660-6301 PT Time Calculation (min) (ACUTE ONLY): 35 min   Charges:   PT Evaluation $Initial PT Evaluation Tier I: 1 Procedure PT Treatments $Gait Training: 8-22 mins   PT G Codes:       Mackenzy Eisenberg H. Manson Passey, PT, DPT 06/06/2014, 9:41 AM [phone]

## 2014-06-06 NOTE — Progress Notes (Signed)
[facility] [place] [facility]   PATIENT NAME: Rachel Gilbert    MR#:  [id number]  DATE OF BIRTH:  July 12, 1960  SUBJECTIVE:  CHIEF COMPLAINT:   Chief Complaint  Patient presents with  . Headache  . Extremity Weakness   Calm. No acute complaints. Feels she is getting stronger.  REVIEW OF SYSTEMS:  Review of Systems  Constitutional: Negative for fever.  Eyes: Negative for blurred vision and double vision.  Respiratory: Negative for shortness of breath.   Cardiovascular: Negative for chest pain.  Gastrointestinal: Negative for heartburn and abdominal pain.  Genitourinary: Negative for dysuria.  Musculoskeletal: Negative for myalgias and falls.  Neurological: Negative for dizziness, focal weakness and headaches.  Psychiatric/Behavioral: Positive for depression.     DRUG ALLERGIES:   Allergies  Allergen Reactions  . Oxycodone Anaphylaxis    VITALS:  Blood pressure 150/87, pulse 65, temperature 97.5 F (36.4 C), temperature source Oral, resp. rate 20, height  (1.753 m), weight 122.607 kg (270 lb 4.8 oz), last menstrual period 06/01/2014, SpO2 100 %.  PHYSICAL EXAMINATION:  Physical Exam  Constitutional: She is oriented to person, place, and time. She appears well-developed.  HENT:  Head: Normocephalic and atraumatic.  Eyes: EOM are normal. Pupils are equal, round, and reactive to light.  Neck: Normal range of motion.  Cardiovascular: Normal rate and normal heart sounds.   No murmur heard. Pulmonary/Chest: Effort normal and breath sounds normal.  Abdominal: Soft. Bowel sounds are normal.  Musculoskeletal: Normal range of motion.  Neurological: She is alert and oriented to person, place, and time.  Skin: Skin is warm and dry.     LABORATORY PANEL:   CBC  Recent Labs Lab 06/06/14 0604  WBC 11.0  HGB 13.7  HCT 42.4  PLT 230    ------------------------------------------------------------------------------------------------------------------  Chemistries   Recent Labs Lab 06/04/14 0011  06/06/14 0604  NA 139  --  139  K 3.2*  --  4.1  CL 107  --  110  CO2 25  --  22  GLUCOSE 109*  --  136*  BUN 12  --  24*  CREATININE 1.06*  < > 1.19*  CALCIUM 8.8*  --  8.6*  AST 15  --   --   ALT 16  --   --   ALKPHOS 52  --   --   BILITOT 1.0  --   --   < > = values in this interval not displayed. ------------------------------------------------------------------------------------------------------------------  Cardiac Enzymes No results for input(s): TROPONINI in the last 168 hours. ------------------------------------------------------------------------------------------------------------------  RADIOLOGY:  Mr Lodema Pilot Contrast  06/05/2014   CLINICAL DATA:  54 year old hypertensive female with history of multiple sclerosis (2012) complaining of disorientation and nausea. Both legs feel heavy. Initial encounter.  EXAM: MRI HEAD WITHOUT AND WITH CONTRAST  MRI CERVICAL SPINE WITHOUT AND WITH CONTRAST  TECHNIQUE: Multiplanar, multiecho pulse sequences of the brain and surrounding structures, and cervical spine, to include the craniocervical junction and cervicothoracic junction, were obtained without and with intravenous contrast.  CONTRAST:  20mL MULTIHANCE GADOBENATE DIMEGLUMINE 529 MG/ML IV SOLN  COMPARISON:  None.  FINDINGS: MRI HEAD FINDINGS  Exam is motion degraded.  Focal area of restricted motion involving the anterior body of the left aspect of the corpus callosum and left lateral aspect of the vermis. Given the patient's history and surrounding findings (described below), findings are suspicious for active demyelinating plaques. Although the patient is obese and hypertensive, the involvement of 2 vascular  distributions suggests that acute infarcts as cause for regions of restricted motion is a less likely  consideration.  Multiple areas of altered signal intensity most prominent periventricular region although also involving the basal ganglia and left thalamus may reflect result of demyelination. Sequelae of prior infarcts/ regions of ischemia contributing to the white matter type changes may contribute to the white matter changes.  White matter type changes central pons have an appearance more suggestive of result of small vessel disease.  No intracranial mass or enhancing lesion.  Scattered blood breakdown products may reflect result of prior hemorrhagic ischemia.  Exophthalmos.  Major intracranial vascular structures are patent.  MRI CERVICAL SPINE FINDINGS  Exam is motion degraded.  No focal cervical cord signal abnormality or enhancement to suggest demyelinating plaque involving the cervical cord.  C2-3 thru C4-5 without significant abnormality.  C5-6: Shallow broad-based disc osteophyte greater to left with mild narrowing ventral aspect of the thecal sac greater on the left. Uncinate hypertrophy. Mild slightly moderate foraminal narrowing greater on the left.  C6-7: Shallow broad-based disc osteophyte. Mild narrowing ventral aspect of the thecal sac. Uncinate hypertrophy with mild foraminal narrowing.  C7-T1:  Mild facet joint degenerative changes.  IMPRESSION: MRI HEAD  Active demyelinating plaques suspected involving the anterior body of the left aspect of the corpus callosum and left lateral aspect of the vermis. Although the patient is obese and hypertensive, the involvement of 2 vascular distributions suggests that acute infarcts as cause for regions of restricted motion is a secondary less likely consideration.  Prominent white matter type changes most notable periventricular region may represent combination of result of multiple sclerosis and prior ischemia/ infarcts. Demyelinating process felt to be the dominant cause of white matter changes.  Scattered blood breakdown products may reflect result of  prior hemorrhagic ischemia.  Exophthalmos.  MRI CERVICAL SPINE  Exam is motion degraded.  No focal cervical cord signal abnormality or enhancement to suggest demyelinating plaque involving the cervical cord.  Cervical spondylotic changes most notable C5-6 followed by the C6-7 level as detailed above.   Electronically Signed   By: Lacy Duverney M.D.   On: 06/05/2014 11:12   Mr Cervical Spine W Wo Contrast  06/05/2014   CLINICAL DATA:  54 year old hypertensive female with history of multiple sclerosis (2012) complaining of disorientation and nausea. Both legs feel heavy. Initial encounter.  EXAM: MRI HEAD WITHOUT AND WITH CONTRAST  MRI CERVICAL SPINE WITHOUT AND WITH CONTRAST  TECHNIQUE: Multiplanar, multiecho pulse sequences of the brain and surrounding structures, and cervical spine, to include the craniocervical junction and cervicothoracic junction, were obtained without and with intravenous contrast.  CONTRAST:  20mL MULTIHANCE GADOBENATE DIMEGLUMINE 529 MG/ML IV SOLN  COMPARISON:  None.  FINDINGS: MRI HEAD FINDINGS  Exam is motion degraded.  Focal area of restricted motion involving the anterior body of the left aspect of the corpus callosum and left lateral aspect of the vermis. Given the patient's history and surrounding findings (described below), findings are suspicious for active demyelinating plaques. Although the patient is obese and hypertensive, the involvement of 2 vascular distributions suggests that acute infarcts as cause for regions of restricted motion is a less likely consideration.  Multiple areas of altered signal intensity most prominent periventricular region although also involving the basal ganglia and left thalamus may reflect result of demyelination. Sequelae of prior infarcts/ regions of ischemia contributing to the white matter type changes may contribute to the white matter changes.  White matter type changes central pons have an  appearance more suggestive of result of small vessel  disease.  No intracranial mass or enhancing lesion.  Scattered blood breakdown products may reflect result of prior hemorrhagic ischemia.  Exophthalmos.  Major intracranial vascular structures are patent.  MRI CERVICAL SPINE FINDINGS  Exam is motion degraded.  No focal cervical cord signal abnormality or enhancement to suggest demyelinating plaque involving the cervical cord.  C2-3 thru C4-5 without significant abnormality.  C5-6: Shallow broad-based disc osteophyte greater to left with mild narrowing ventral aspect of the thecal sac greater on the left. Uncinate hypertrophy. Mild slightly moderate foraminal narrowing greater on the left.  C6-7: Shallow broad-based disc osteophyte. Mild narrowing ventral aspect of the thecal sac. Uncinate hypertrophy with mild foraminal narrowing.  C7-T1:  Mild facet joint degenerative changes.  IMPRESSION: MRI HEAD  Active demyelinating plaques suspected involving the anterior body of the left aspect of the corpus callosum and left lateral aspect of the vermis. Although the patient is obese and hypertensive, the involvement of 2 vascular distributions suggests that acute infarcts as cause for regions of restricted motion is a secondary less likely consideration.  Prominent white matter type changes most notable periventricular region may represent combination of result of multiple sclerosis and prior ischemia/ infarcts. Demyelinating process felt to be the dominant cause of white matter changes.  Scattered blood breakdown products may reflect result of prior hemorrhagic ischemia.  Exophthalmos.  MRI CERVICAL SPINE  Exam is motion degraded.  No focal cervical cord signal abnormality or enhancement to suggest demyelinating plaque involving the cervical cord.  Cervical spondylotic changes most notable C5-6 followed by the C6-7 level as detailed above.   Electronically Signed   By: Lacy Duverney M.D.   On: 06/05/2014 11:12    EKG:   Orders placed or performed during the hospital  encounter of 06/03/14  . ED EKG  . ED EKG  . EKG 12-Lead  . EKG 12-Lead    ASSESSMENT AND PLAN:   1) Acute exacerbation of multiple sclerosis - Presented with lower extremity weakness and fatigue. She also has some degree of memory loss and mild cognitive dysfunction - appreciate neurology consultation - continue with solumedrol - will need a local neurologist for outpatient follow up - MRI brain and spinal cord negative, await further neuro recs - PT/OT eval recommendation is for outpatient physical therapy  2) anxiety: Improved - Restart SSRI, valium prn     All the records are reviewed and case discussed with Care Management/Social Workerr. Management plans discussed with the patient, family and they are in agreement.  CODE STATUS: full  TOTAL TIME TAKING CARE OF THIS PATIENT: 35 minutes.   POSSIBLE D/C IN 3 DAYS, DEPENDING ON CLINICAL CONDITION.   Elby Showers M.D on 06/06/2014 at 3:32 PM  Between 7am to 6pm - Pager - [phone]  After 6pm go to [email or web address] - password [email or web address] [facility]  [facility]  Office  [phone]  CC: Primary care physician; No primary care provider on file.

## 2014-06-07 LAB — BASIC METABOLIC PANEL
Anion gap: 3 — ABNORMAL LOW (ref 5–15)
BUN: 30 mg/dL — AB (ref 6–20)
CALCIUM: 8.1 mg/dL — AB (ref 8.9–10.3)
CHLORIDE: 111 mmol/L (ref 101–111)
CO2: 24 mmol/L (ref 22–32)
CREATININE: 1.25 mg/dL — AB (ref 0.44–1.00)
GFR calc Af Amer: 56 mL/min — ABNORMAL LOW (ref >60)
GFR calc non Af Amer: 49 mL/min — ABNORMAL LOW (ref >60)
GLUCOSE: 133 mg/dL — AB (ref 65–99)
POTASSIUM: 3.9 mmol/L (ref 3.5–5.1)
Sodium: 138 mmol/L (ref 135–145)

## 2014-06-07 LAB — CBC
HEMATOCRIT: 43.7 % (ref 35.0–47.0)
HEMOGLOBIN: 14.1 g/dL (ref 12.0–16.0)
MCH: 29.3 pg (ref 26.0–34.0)
MCHC: 32.3 g/dL (ref 32.0–36.0)
MCV: 90.5 fL (ref 80.0–100.0)
Platelets: 211 10*3/uL (ref 150–440)
RBC: 4.83 MIL/uL (ref 3.80–5.20)
RDW: 13.9 % (ref 11.5–14.5)
WBC: 8.4 10*3/uL (ref 3.6–11.0)

## 2014-06-07 LAB — GLUCOSE, CAPILLARY: GLUCOSE-CAPILLARY: 144 mg/dL — AB (ref 70–99)

## 2014-06-07 MED ORDER — FLUOXETINE HCL 40 MG PO CAPS: 40.0000 mg | ORAL_CAPSULE | Freq: Every day | ORAL | Status: DC

## 2014-06-07 MED ORDER — MAGNESIUM HYDROXIDE 400 MG/5ML PO SUSP
30.0000 mL | Freq: Two times a day (BID) | ORAL | Status: DC
  Administered 2014-06-07: 30 mL via ORAL
  Filled 2014-06-07: qty 30

## 2014-06-07 NOTE — Progress Notes (Signed)
Patient discharged home. Instructions given to pt, verbalized understanding. Transportation via husband.

## 2014-06-07 NOTE — Discharge Instructions (Signed)
DIET:  Low fat, Low cholesterol diet, Carbohydrate modified  DISCHARGE CONDITION:  Stable  ACTIVITY:  Activity as tolerated, please join MS aquatic therapy class Avoid driving until seen by neurology. Multiple Sclerosis Multiple sclerosis (MS) is a disease of the central nervous system. It leads to the loss of the insulating covering of the nerves (myelin sheath) of your brain. When this happens, brain signals do not get sent properly or may not get sent at all. The age of onset of MS varies.  CAUSES The cause of MS is unknown. However, it is more common in the [place] than in the [place]. RISK FACTORS There is a higher number of women with MS than men. MS is not an illness that is passed down to you from your family members (inherited). However, your risk of MS is higher if you have a relative with MS. SIGNS AND SYMPTOMS  The symptoms of MS occur in episodes or attacks. These attacks may last weeks to months. There may be long periods of almost no symptoms between attacks. The symptoms of MS vary. This is because of the many different ways it affects the central nervous system. The main symptoms of MS include:  Vision problems and eye pain.  Numbness.  Weakness.  Inability to move your arms, hands, feet, or legs (paralysis).  Balance problems.  Tremors. DIAGNOSIS  Your health care provider can diagnose MS with the help of imaging exams and lab tests. These may include specialized X-ray exams and spinal fluid tests. The best imaging exam to confirm a diagnosis of MS is an MRI. TREATMENT  There is no known cure for MS, but there are medicines that can decrease the number and frequency of attacks. Steroids are often used for short-term relief. Physical and occupational therapy may also help. There are also many new alternative or complementary treatments available to help control the symptoms of MS. Ask your health care provider if any of these other  options are right for you. HOME CARE INSTRUCTIONS   Take medicines as directed by your health care provider.  Exercise as directed by your health care provider. SEEK MEDICAL CARE IF: You begin to feel depressed. SEEK IMMEDIATE MEDICAL CARE IF:  You develop paralysis.  You have problems with bladder, bowel, or sexual function.  You develop mental changes, such as forgetfulness or mood swings.  You have a period of uncontrolled movements (seizure). Document Released: 01/11/2000 Document Revised: 01/18/2013 Document Reviewed: 09/20/2012 [facility] Patient Information 2015 [place], [place]. This information is not intended to replace advice given to you by your health care provider. Make sure you discuss any questions you have with your health care provider.   OXYGEN:  Home Oxygen: No.   Oxygen Delivery: room air  DISCHARGE LOCATION:  Home   If you experience worsening of your admission symptoms, develop shortness of breath, life threatening emergency, suicidal or homicidal thoughts you must seek medical attention immediately by calling 911 or calling your MD immediately  if symptoms less severe.  You Must read complete instructions/literature along with all the possible adverse reactions/side effects for all the Medicines you take and that have been prescribed to you. Take any new Medicines after you have completely understood and accpet all the possible adverse reactions/side effects.   Please note  You were cared for by a hospitalist during your hospital stay. If you have any questions about your discharge medications or the care you received while you were in the hospital after you  are discharged, you can call the unit and asked to speak with the hospitalist on call if the hospitalist that took care of you is not available. Once you are discharged, your primary care physician will handle any further medical issues. Please note that NO REFILLS for any discharge medications will be  authorized once you are discharged, as it is imperative that you return to your primary care physician (or establish a relationship with a primary care physician if you do not have one) for your aftercare needs so that they can reassess your need for medications and monitor your lab values.

## 2014-06-07 NOTE — Discharge Summary (Signed)
[facility] - [place] at [facility]  DISCHARGE SUMMARY   PATIENT NAME: Rachel Gilbert    MR#:  [id number]  DATE OF BIRTH:  22-Aug-1960  DATE OF ADMISSION:  06/03/2014 ADMITTING PHYSICIAN: Wyatt Haste, MD  DATE OF DISCHARGE: 06/07/2014  PRIMARY CARE PHYSICIAN: No primary care provider on file.    ADMISSION DIAGNOSIS:  Multiple sclerosis exacerbation [G35]  Lower extremity weakness  DISCHARGE DIAGNOSIS:  Principal Problem:   Multiple sclerosis exacerbation Active Problems:   Essential hypertension   Obesity   Obstructive sleep apnea on CPAP   Gastroesophageal reflux disease without esophagitis   Multiple sclerosis   SECONDARY DIAGNOSIS:   Past Medical History  Diagnosis Date  . Multiple sclerosis   . Hypertension     HOSPITAL COURSE:    1) Acute exacerbation of multiple sclerosis - Presented with lower extremity weakness and fatigue. She also has some degree of chronic memory loss and mild cognitive dysfunction. She has received 1 g solumedrol x 3 d.  Has had improvement in lower extremity symptoms and will continue with aquatic therapy as outpatient.  MRI during this admission did not show any new MS type lesions.  She will follow with outpatient neurology and would like to go to [facility].  She will also need to find a local PCP to manage chronic medical conditions.  DISCHARGE CONDITIONS:   stable  CONSULTS OBTAINED:  Treatment Team:  Pauletta Browns, MD  DRUG ALLERGIES:   Allergies  Allergen Reactions  . Oxycodone Anaphylaxis    DISCHARGE MEDICATIONS:   Current Discharge Medication List    START taking these medications   Details  FLUoxetine (PROZAC) 40 MG capsule Take 1 capsule (40 mg total) by mouth daily. Qty: 30 capsule, Refills: 3      CONTINUE these medications which have NOT CHANGED   Details  acetaminophen (TYLENOL) 500 MG tablet Take 500 mg by mouth every 6 (six) hours as needed for mild  pain.    brimonidine (ALPHAGAN P) 0.1 % SOLN Place 2 drops into the left eye 2 (two) times daily.    carvedilol (COREG) 25 MG tablet Take 25 mg by mouth 2 (two) times daily with a meal.    dalfampridine 10 MG TB12 Take 10 mg by mouth daily.    doxazosin (CARDURA) 4 MG tablet Take 4 mg by mouth daily.    Fingolimod HCl 0.5 MG CAPS Take 1 capsule by mouth daily.    hydrALAZINE (APRESOLINE) 50 MG tablet Take 50 mg by mouth 2 (two) times daily.    ibuprofen (ADVIL,MOTRIN) 200 MG tablet Take 200 mg by mouth every 6 (six) hours as needed for moderate pain.    lisinopril (PRINIVIL,ZESTRIL) 40 MG tablet Take 40 mg by mouth daily.    methylphenidate (RITALIN) 20 MG tablet Take 20 mg by mouth 2 (two) times daily.    ondansetron (ZOFRAN) 4 MG tablet Take 4 mg by mouth every 8 (eight) hours as needed for nausea or vomiting.    ranitidine (ZANTAC) 300 MG capsule Take 300 mg by mouth every evening.    spironolactone (ALDACTONE) 50 MG tablet Take 50 mg by mouth daily.         DISCHARGE INSTRUCTIONS:   DIET:  Cardiac diet and Diabetic diet  DISCHARGE CONDITION:  Stable  ACTIVITY:  Activity as tolerated  OXYGEN:  Home Oxygen: No.   Oxygen Delivery: room air  DISCHARGE LOCATION:  home   If you experience worsening of your admission symptoms,  develop shortness of breath, life threatening emergency, suicidal or homicidal thoughts you must seek medical attention immediately by calling 911 or calling your MD immediately  if symptoms less severe.  You Must read complete instructions/literature along with all the possible adverse reactions/side effects for all the Medicines you take and that have been prescribed to you. Take any new Medicines after you have completely understood and accpet all the possible adverse reactions/side effects.   Please note  You were cared for by a hospitalist during your hospital stay. If you have any questions about your discharge medications or the care  you received while you were in the hospital after you are discharged, you can call the unit and asked to speak with the hospitalist on call if the hospitalist that took care of you is not available. Once you are discharged, your primary care physician will handle any further medical issues. Please note that NO REFILLS for any discharge medications will be authorized once you are discharged, as it is imperative that you return to your primary care physician (or establish a relationship with a primary care physician if you do not have one) for your aftercare needs so that they can reassess your need for medications and monitor your lab values.     Today   CHIEF COMPLAINT:   Chief Complaint  Patient presents with  . Headache  . Extremity Weakness    HISTORY OF PRESENT ILLNESS:  Rachel Gilbert is a 55 y.o. female with a known history of multiple sclerosis who is presenting with generalized weakness.  She describes 3-4 day duration of progressively worsening generalized weakness, fatigue. She denies any focal neurological deficits.  Denies fever, chills, prior illnesses or further symptomatology. She is unfortunately required 3 prior hospitalizations for multiple sclerosis exacerbation.  VITAL SIGNS:  Blood pressure 153/79, pulse 63, temperature 97.7 F (36.5 C), temperature source Oral, resp. rate 18, height 5\' 9"  (1.753 m), weight 122.607 kg (270 lb 4.8 oz), last menstrual period 06/01/2014, SpO2 97 %.  I/O:   Intake/Output Summary (Last 24 hours) at 06/07/14 0834 Last data filed at 06/06/14 1100  Gross per 24 hour  Intake    480 ml  Output      0 ml  Net    480 ml    PHYSICAL EXAMINATION:  GENERAL:  54 y.o.-year-old patient lying in the bed with no acute distress. Obese. EYES: Pupils equal, round, reactive to light and accommodation. No scleral icterus. Extraocular muscles intact.  HEENT: Head atraumatic, normocephalic. Oropharynx and nasopharynx clear.  NECK:  Supple, no jugular  venous distention. No thyroid enlargement, no tenderness.  LUNGS: Normal breath sounds bilaterally, no wheezing, rales,rhonchi or crepitation. No use of accessory muscles of respiration.  CARDIOVASCULAR: S1, S2 normal. No murmurs, rubs, or gallops.  ABDOMEN: Soft, non-tender, non-distended. Bowel sounds present. No organomegaly or mass.  EXTREMITIES: No pedal edema, cyanosis, or clubbing.  NEUROLOGIC: Cranial nerves II through XII are intact. Muscle strength 5/5 in all extremities. Sensation intact. Gait not checked.  PSYCHIATRIC: The patient is alert and oriented x 3.  SKIN: No obvious rash, lesion, or ulcer.   DATA REVIEW:   CBC  Recent Labs Lab 06/07/14 0601  WBC 8.4  HGB 14.1  HCT 43.7  PLT 211    Chemistries   Recent Labs Lab 06/04/14 0011  06/07/14 0601  NA 139  < > 138  K 3.2*  < > 3.9  CL 107  < > 111  CO2 25  < >  24  GLUCOSE 109*  < > 133*  BUN 12  < > 30*  CREATININE 1.06*  < > 1.25*  CALCIUM 8.8*  < > 8.1*  AST 15  --   --   ALT 16  --   --   ALKPHOS 52  --   --   BILITOT 1.0  --   --   < > = values in this interval not displayed.  Cardiac Enzymes No results for input(s): TROPONINI in the last 168 hours.  Microbiology Results  No results found for this or any previous visit.  RADIOLOGY:  Mr Lodema Pilot Contrast  06/05/2014   CLINICAL DATA:  54 year old hypertensive female with history of multiple sclerosis (2012) complaining of disorientation and nausea. Both legs feel heavy. Initial encounter.  EXAM: MRI HEAD WITHOUT AND WITH CONTRAST  MRI CERVICAL SPINE WITHOUT AND WITH CONTRAST  TECHNIQUE: Multiplanar, multiecho pulse sequences of the brain and surrounding structures, and cervical spine, to include the craniocervical junction and cervicothoracic junction, were obtained without and with intravenous contrast.  CONTRAST:  20mL MULTIHANCE GADOBENATE DIMEGLUMINE 529 MG/ML IV SOLN  COMPARISON:  None.  FINDINGS: MRI HEAD FINDINGS  Exam is motion degraded.  Focal  area of restricted motion involving the anterior body of the left aspect of the corpus callosum and left lateral aspect of the vermis. Given the patient's history and surrounding findings (described below), findings are suspicious for active demyelinating plaques. Although the patient is obese and hypertensive, the involvement of 2 vascular distributions suggests that acute infarcts as cause for regions of restricted motion is a less likely consideration.  Multiple areas of altered signal intensity most prominent periventricular region although also involving the basal ganglia and left thalamus may reflect result of demyelination. Sequelae of prior infarcts/ regions of ischemia contributing to the white matter type changes may contribute to the white matter changes.  White matter type changes central pons have an appearance more suggestive of result of small vessel disease.  No intracranial mass or enhancing lesion.  Scattered blood breakdown products may reflect result of prior hemorrhagic ischemia.  Exophthalmos.  Major intracranial vascular structures are patent.  MRI CERVICAL SPINE FINDINGS  Exam is motion degraded.  No focal cervical cord signal abnormality or enhancement to suggest demyelinating plaque involving the cervical cord.  C2-3 thru C4-5 without significant abnormality.  C5-6: Shallow broad-based disc osteophyte greater to left with mild narrowing ventral aspect of the thecal sac greater on the left. Uncinate hypertrophy. Mild slightly moderate foraminal narrowing greater on the left.  C6-7: Shallow broad-based disc osteophyte. Mild narrowing ventral aspect of the thecal sac. Uncinate hypertrophy with mild foraminal narrowing.  C7-T1:  Mild facet joint degenerative changes.  IMPRESSION: MRI HEAD  Active demyelinating plaques suspected involving the anterior body of the left aspect of the corpus callosum and left lateral aspect of the vermis. Although the patient is obese and hypertensive, the  involvement of 2 vascular distributions suggests that acute infarcts as cause for regions of restricted motion is a secondary less likely consideration.  Prominent white matter type changes most notable periventricular region may represent combination of result of multiple sclerosis and prior ischemia/ infarcts. Demyelinating process felt to be the dominant cause of white matter changes.  Scattered blood breakdown products may reflect result of prior hemorrhagic ischemia.  Exophthalmos.  MRI CERVICAL SPINE  Exam is motion degraded.  No focal cervical cord signal abnormality or enhancement to suggest demyelinating plaque involving the cervical cord.  Cervical  spondylotic changes most notable C5-6 followed by the C6-7 level as detailed above.   Electronically Signed   By: Lacy Duverney M.D.   On: 06/05/2014 11:12   Mr Cervical Spine W Wo Contrast  06/05/2014   CLINICAL DATA:  54 year old hypertensive female with history of multiple sclerosis (2012) complaining of disorientation and nausea. Both legs feel heavy. Initial encounter.  EXAM: MRI HEAD WITHOUT AND WITH CONTRAST  MRI CERVICAL SPINE WITHOUT AND WITH CONTRAST  TECHNIQUE: Multiplanar, multiecho pulse sequences of the brain and surrounding structures, and cervical spine, to include the craniocervical junction and cervicothoracic junction, were obtained without and with intravenous contrast.  CONTRAST:  20mL MULTIHANCE GADOBENATE DIMEGLUMINE 529 MG/ML IV SOLN  COMPARISON:  None.  FINDINGS: MRI HEAD FINDINGS  Exam is motion degraded.  Focal area of restricted motion involving the anterior body of the left aspect of the corpus callosum and left lateral aspect of the vermis. Given the patient's history and surrounding findings (described below), findings are suspicious for active demyelinating plaques. Although the patient is obese and hypertensive, the involvement of 2 vascular distributions suggests that acute infarcts as cause for regions of restricted motion is  a less likely consideration.  Multiple areas of altered signal intensity most prominent periventricular region although also involving the basal ganglia and left thalamus may reflect result of demyelination. Sequelae of prior infarcts/ regions of ischemia contributing to the white matter type changes may contribute to the white matter changes.  White matter type changes central pons have an appearance more suggestive of result of small vessel disease.  No intracranial mass or enhancing lesion.  Scattered blood breakdown products may reflect result of prior hemorrhagic ischemia.  Exophthalmos.  Major intracranial vascular structures are patent.  MRI CERVICAL SPINE FINDINGS  Exam is motion degraded.  No focal cervical cord signal abnormality or enhancement to suggest demyelinating plaque involving the cervical cord.  C2-3 thru C4-5 without significant abnormality.  C5-6: Shallow broad-based disc osteophyte greater to left with mild narrowing ventral aspect of the thecal sac greater on the left. Uncinate hypertrophy. Mild slightly moderate foraminal narrowing greater on the left.  C6-7: Shallow broad-based disc osteophyte. Mild narrowing ventral aspect of the thecal sac. Uncinate hypertrophy with mild foraminal narrowing.  C7-T1:  Mild facet joint degenerative changes.  IMPRESSION: MRI HEAD  Active demyelinating plaques suspected involving the anterior body of the left aspect of the corpus callosum and left lateral aspect of the vermis. Although the patient is obese and hypertensive, the involvement of 2 vascular distributions suggests that acute infarcts as cause for regions of restricted motion is a secondary less likely consideration.  Prominent white matter type changes most notable periventricular region may represent combination of result of multiple sclerosis and prior ischemia/ infarcts. Demyelinating process felt to be the dominant cause of white matter changes.  Scattered blood breakdown products may reflect  result of prior hemorrhagic ischemia.  Exophthalmos.  MRI CERVICAL SPINE  Exam is motion degraded.  No focal cervical cord signal abnormality or enhancement to suggest demyelinating plaque involving the cervical cord.  Cervical spondylotic changes most notable C5-6 followed by the C6-7 level as detailed above.   Electronically Signed   By: Lacy Duverney M.D.   On: 06/05/2014 11:12    EKG:   Orders placed or performed during the hospital encounter of 06/03/14  . ED EKG  . ED EKG  . EKG 12-Lead  . EKG 12-Lead      Management plans discussed with the patient,  family and they are in agreement.  CODE STATUS:     Code Status Orders        Start     Ordered   06/04/14 0313  Full code   Continuous     06/04/14 0313      TOTAL TIME TAKING CARE OF THIS PATIENT: 35 minutes.    Elby Showers M.D on 06/07/2014 at 8:34 AM  Between 7am to 6pm - Pager - [phone]  After 6pm go to [email or web address] - password [email or web address] [facility]  [facility]  Office  [phone]  CC: Primary care physician; No primary care provider on file.

## 2014-06-07 NOTE — Progress Notes (Signed)
Occupational Therapy Treatment Patient Details Name: Rachel Gilbert MRN: [id number] DOB: [birth year] Today's Date: 06/07/2014    History of present illness presented to ER with 3-4 day history of progressive LE weakness, fatigue; admitted with acute MS exacerbation.  Treating with IV solu-medrol; patient reporting improvement in strength since treatment.  Per neuro notes, patient with "active demyelinating plaques over the anterior body of L aspect of the corpus callosum and L lateral aspect of vermis; no acute c-spine findings".  Of note, patient initially diagnosed with MS in 2012 and has 3 exacerbations with resulting hospitalizations since diagnosis (last flare approx 1.5 years ago)   OT comments  Pt seen for dressing skills sitting EOB with cues for energy conservation tech to help relieve fatigue.  Discussed rec for taking rest break after getting out of hot shower before getting dressed and to sit in chair vs EOB for safety and support.  Reviewed use of dycem and no other needs from adaptive equipment catalog identified.  Pt to go home today and all goals met.  Follow Up Recommendations       Equipment Recommendations       Recommendations for Other Services      Precautions / Restrictions         Mobility Bed Mobility                  Transfers                      Balance                                   ADL                                         General ADL Comments: Pt getting dressed at EOB with increased WOB but vitals stable when putting pants over feet and hips.  Reviewed energy conservation tech since pt had just gone from hot shower to getting dressed.  Reviewed adaptive equipment catalog and no other needs identified with pt or boyfriend.        Vision                     Perception     Praxis      Cognition   Behavior During Therapy: WFL for tasks assessed/performed Overall Cognitive Status:  Within Functional Limits for tasks assessed       Memory: Decreased short-term memory               Extremity/Trunk Assessment               Exercises     Shoulder Instructions       General Comments      Pertinent Vitals/ Pain       Pain Assessment: No/denies pain  Home Living                                          Prior Functioning/Environment              Frequency       Progress Toward Goals  OT Goals(current goals can now be found in the care plan section)  Progress towards OT goals: Goals met/education completed, patient discharged from [place] Discharge plan remains appropriate    Co-evaluation                 End of Session     Activity Tolerance Patient limited by fatigue   Patient Left in chair;with family/visitor present (pt getting ready to go home so no alarm on chair or bed and boyfriend in room with her)   Nurse Communication          Time: 7096-2836 OT Time Calculation (min): 25 min  Charges: OT General Charges $OT Visit: 1 Procedure OT Treatments $Self Care/Home Management : 23-37 mins  Oakes Mccready 06/07/2014, 11:07 AM   Chrys Racer, OTR/L

## 2014-06-13 ENCOUNTER — Ambulatory Visit: Payer: Self-pay | Admitting: Neurology

## 2014-06-14 ENCOUNTER — Encounter: Payer: Self-pay | Admitting: Neurology

## 2014-06-14 ENCOUNTER — Telehealth: Payer: Self-pay | Admitting: *Deleted

## 2014-06-14 ENCOUNTER — Ambulatory Visit (INDEPENDENT_AMBULATORY_CARE_PROVIDER_SITE_OTHER): Payer: BLUE CROSS/BLUE SHIELD | Admitting: Neurology

## 2014-06-14 ENCOUNTER — Other Ambulatory Visit: Payer: Self-pay | Admitting: *Deleted

## 2014-06-14 VITALS — BP 152/106 | HR 78 | Resp 18 | Ht 69.0 in | Wt 268.0 lb

## 2014-06-14 DIAGNOSIS — F418 Other specified anxiety disorders: Secondary | ICD-10-CM

## 2014-06-14 DIAGNOSIS — G4733 Obstructive sleep apnea (adult) (pediatric): Secondary | ICD-10-CM

## 2014-06-14 DIAGNOSIS — E559 Vitamin D deficiency, unspecified: Secondary | ICD-10-CM

## 2014-06-14 DIAGNOSIS — R269 Unspecified abnormalities of gait and mobility: Secondary | ICD-10-CM | POA: Diagnosis not present

## 2014-06-14 DIAGNOSIS — G35 Multiple sclerosis: Secondary | ICD-10-CM | POA: Diagnosis not present

## 2014-06-14 DIAGNOSIS — I10 Essential (primary) hypertension: Secondary | ICD-10-CM | POA: Diagnosis not present

## 2014-06-14 DIAGNOSIS — Z79899 Other long term (current) drug therapy: Secondary | ICD-10-CM

## 2014-06-14 DIAGNOSIS — Z9989 Dependence on other enabling machines and devices: Secondary | ICD-10-CM

## 2014-06-14 MED ORDER — OXYBUTYNIN CHLORIDE ER 10 MG PO TB24: 10.0000 mg | ORAL_TABLET | Freq: Every day | ORAL | Status: DC

## 2014-06-14 MED ORDER — AMPHETAMINE-DEXTROAMPHETAMINE 10 MG PO TABS: ORAL_TABLET | ORAL | Status: DC

## 2014-06-14 NOTE — Progress Notes (Signed)
[facility]  PATIENT: Rachel Gilbert DOB: 01/19/1961  REFERRING DOCTOR OR PCP:  ER  No PCP SOURCE: ER notes, images on PACS, patient  _________________________________   HISTORICAL  CHIEF COMPLAINT:  Chief Complaint  Patient presents with  . Multiple Sclerosis    Sts. dx. in 2012.  Presenting sx. was double vision.  She was living in [place] at the time and was seen in the ED there--sts. mri brain was suspicious for MS, so she was admitted to the hospital, received 5 days of IV steroids and saw a neurologist--Dr. Steward Ros.  Sts. lp was negative, but dx.  of ms was made based on mri results.  She was started on Gilenya, which she is still on and sts. she tolerates it well.  She did well for 2 yrs., then relapsed in July 2014. Sts. mri showed new lesions.   . Visual disturbance    Sts. Dr. Julian Reil wanted  her to continue Gilenya despite new sx. of difficulty walking, and new lesions. He started her on Ampyra which she does feel helps.  She moved to [facility] in Feb. 2016.  On 06-04-14 she began having more weakness, began dropping things, felt more confused and sluggish.  She went to [facility] and sts. mri brain and c-spine did not show any new lesions, but doctors there did not have old mri's for comparison.  Today she is ambulatory with a cane.  She is tearful, sts. she   . Gait disturbance    is "tender  hearted" and has always cried alot, but more so since dx. of MS/fim    HISTORY OF PRESENT ILLNESS:  I had the pleasure of seeing your patient, Rachel Gilbert, at [facility] for neurologic consultation regarding her relapsing remitting multiple sclerosis.  MS history: She was diagnosed in September 2012 after presenting with diplopia. She was hospitalized and received 5 days of IV steroids. So they show the MRI did a lumbar puncture at that time, she had both MRI and lumbar puncture. She recalls that the lumbar puncture was not consistent  with MS but the MRI was.  She was placed on Gilenya in December 2012. She was able to return to work and the diplopia completely resolved. Then in July 2014 she had another exacerbation with visual disturbance bilaterally, gait changes and cognitive changes.  Reportedly, there was a new lesion in the brainstem and also in the spinal cord at that time. She received 5 more days of steroid.   She was started on Ampyra with benefit. Unfortunately she had to go off when her COBRA ran out. She felt her MS was mostly stable except for mild worsening of walking and the need to use a cane over the next 21-22 months.  In early May 2016, she had the onset of several symptoms including worse cognitive problems, worse gait problems and weakness in the hands.   She went to the [place] [facility] emergency room on 06/03/2014 and she was evaluated. An admitted and received 3 days of IV Solu-Medrol. I personally reviewed the MRI images that were performed at that time. The MRI of the brain shows many white matter lesions. Some are periventricular with an appearance consistent with MS. She also has deep white matter subcortical and juxtacortical foci. Some of these look more consistent with small vessel ischemic changes and somewhat more consistent with multiple sclerosis. She does have  new foci in the left vermis of the cerebellum and in the left frontal  periventricular region..  There were no enhancing foci. The MRI of the cervical spine showed degenerative changes at C5-C6 and C6-C7 with mild foraminal narrowing but no definite nerve root compression. I did not note any spinal cord lesions. She is better now than she was 2 weeks ago but still feels that she is far from her baseline.  Gait/strength/sensation/coordination:   She reports difficulty with her gait that is mostly due to poor balance. She does not note significant weakness in the legs or arms at this time but she feels there could be minimal weakness. She denies any  significant numbness in the arms or legs. She does note that there is clumsiness in arms and legs bilaterally. One side does not seem worse than the other side.  Bladder/bowel: She reports some difficulty with urinary urgency, frequency and nocturia (twice a night). She has had incontinence a few times, often when she is laughing. She does not report any hesitancy but sometimes feels she has not completely emptied her bladder. She has noted some constipation in the hospital recently but is doing better now.  Vision: She feels her vision is doing better. She has not had optic neuritis in the past. She does sometimes note a waving like heat off of hot concrete in her vision. There is currently no double vision. He has not noted color desaturation. There is no eye pain.  Fatigue/sleep: She does note both physical and mental fatigue. The Ampyra has helped her gait endurance as she is able to walk a little further with it. However it has not helped her cognitive skills. She sometimes has sleep onset insomnia. She also will wake up several times a night for urination. She has been on CPAP for 5 years. Initially, she felt that it did help her become more alert and awake. She uses it regularly. She generally is not sleepy during the day but is tired.  Cognitive/mood: She reports both depression and anxiety.  She is not sure how much benefit she is getting from the Prozac right now. The depression is worsening anxiety. She does have some cognitive difficulty. Specifically, she has difficulty with focus, attention, processing speed, executive function, verbal fluency and short-term memory.   REVIEW OF SYSTEMS: Constitutional: No fevers, chills, sweats, or change in appetite Eyes: No visual changes, double vision, eye pain Ear, nose and throat: No hearing loss, ear pain, nasal congestion, sore throat Cardiovascular: No chest pain, palpitations Respiratory: No shortness of breath at rest or with exertion.   No  wheezes GastrointestinaI: No nausea, vomiting, diarrhea, abdominal pain, fecal incontinence Genitourinary: No dysuria, urinary retention or frequency.  No nocturia. Musculoskeletal: No neck pain, back pain Integumentary: No rash, pruritus, skin lesions Neurological: as above Psychiatric: No depression at this time.  No anxiety Endocrine: No palpitations, diaphoresis, change in appetite, change in weigh or increased thirst Hematologic/Lymphatic: No anemia, purpura, petechiae. Allergic/Immunologic: No itchy/runny eyes, nasal congestion, recent allergic reactions, rashes  ALLERGIES: Allergies  Allergen Reactions  . Oxycodone Anaphylaxis    HOME MEDICATIONS:  Current outpatient prescriptions:  .  acetaminophen (TYLENOL) 500 MG tablet, Take 500 mg by mouth every 6 (six) hours as needed for mild pain., Disp: , Rfl:  .  brimonidine (ALPHAGAN P) 0.1 % SOLN, Place 2 drops into the left eye 2 (two) times daily., Disp: , Rfl:  .  carvedilol (COREG) 25 MG tablet, Take 25 mg by mouth 2 (two) times daily with a meal., Disp: , Rfl:  .  dalfampridine 10 MG  TB12, Take 10 mg by mouth daily., Disp: , Rfl:  .  doxazosin (CARDURA) 4 MG tablet, Take 4 mg by mouth daily., Disp: , Rfl:  .  Fingolimod HCl 0.5 MG CAPS, Take 1 capsule by mouth daily., Disp: , Rfl:  .  FLUoxetine (PROZAC) 40 MG capsule, Take 1 capsule (40 mg total) by mouth daily., Disp: 30 capsule, Rfl: 3 .  hydrALAZINE (APRESOLINE) 50 MG tablet, Take 50 mg by mouth 2 (two) times daily., Disp: , Rfl:  .  ibuprofen (ADVIL,MOTRIN) 200 MG tablet, Take 200 mg by mouth every 6 (six) hours as needed for moderate pain., Disp: , Rfl:  .  lisinopril (PRINIVIL,ZESTRIL) 40 MG tablet, Take 40 mg by mouth daily., Disp: , Rfl:  .  methylphenidate (RITALIN) 20 MG tablet, Take 20 mg by mouth 2 (two) times daily., Disp: , Rfl:  .  ondansetron (ZOFRAN) 4 MG tablet, Take 4 mg by mouth every 8 (eight) hours as needed for nausea or vomiting., Disp: , Rfl:  .   ranitidine (ZANTAC) 300 MG capsule, Take 300 mg by mouth every evening., Disp: , Rfl:  .  spironolactone (ALDACTONE) 50 MG tablet, Take 50 mg by mouth daily., Disp: , Rfl:   PAST MEDICAL HISTORY: Past Medical History  Diagnosis Date  . Multiple sclerosis   . Hypertension   . Hearing loss   . Vision abnormalities   . Kidney stones     PAST SURGICAL HISTORY: Past Surgical History  Procedure Laterality Date  . Cesarean section  1986    FAMILY HISTORY: Family History  Problem Relation Age of Onset  . Stroke Other   . Fibromyalgia Mother   . Stroke Father   . Diabetes Father     SOCIAL HISTORY:  History   Social History  . Marital Status: Single    Spouse Name: N/A  . Number of Children: N/A  . Years of Education: N/A   Occupational History  . Not on file.   Social History Main Topics  . Smoking status: Never Smoker   . Smokeless tobacco: Never Used  . Alcohol Use: Yes     Comment: Occasional  . Drug Use: No  . Sexual Activity: Not on file   Other Topics Concern  . Not on file   Social History Narrative     PHYSICAL EXAM  Filed Vitals:   06/14/14 0946  BP: 152/106  Pulse: 78  Resp: 18  Height: 5\' 9"  (1.753 m)  Weight: 268 lb (121.564 kg)    Body mass index is 39.56 kg/(m^2).   General: The patient is well-developed and well-nourished and in no acute distress  Eyes:  Funduscopic exam shows normal optic discs and retinal vessels.  Neck: The neck is supple, no carotid bruits are noted.  The neck is nontender.  Cardiovascular: The heart has a regular rate and rhythm with a normal S1 and S2. There were no murmurs, gallops or rubs.   Skin: Extremities are without significant edema.  Musculoskeletal:  Back is nontender  Neurologic Exam  Mental status: The patient is alert and oriented x 3 at the time of the examination.  At times, she is tearful..  The patient has apparent normal recent and remote memory but decreased attention span and  concentration ability.   Speech is normal.  Cranial nerves: Extraocular movements are full. Pupils are equal, round, and reactive to light and accomodation.  Visual fields are full.  Facial symmetry is present. There is good facial sensation to  soft touch bilaterally.Facial strength is normal.  Trapezius and sternocleidomastoid strength is normal. No dysarthria is noted.  The tongue is midline, and the patient has symmetric elevation of the soft palate. No obvious hearing deficits are noted.  Motor:  Muscle bulk is normal.   Tone is normal. Strength is  5 / 5 in all 4 extremities.   Sensory: Sensory testing is intact to pinprick, soft touch and vibration sensation in all 4 extremities.  Coordination: Cerebellar testing reveals good finger-nose-finger and reduced left heel-to-shin .  Gait and station: Station is normal.   Gait is wide and hesitant. Tandem gait is poor.. Romberg is negative.   Reflexes: Deep tendon reflexes are symmetric and normal bilaterally.   Plantar responses are flexor.    DIAGNOSTIC DATA (LABS, IMAGING, TESTING) - I reviewed patient records, labs, notes, testing and imaging myself where available.  Lab Results  Component Value Date   WBC 8.4 06/07/2014   HGB 14.1 06/07/2014   HCT 43.7 06/07/2014   MCV 90.5 06/07/2014   PLT 211 06/07/2014      Component Value Date/Time   NA 138 06/07/2014 0601   K 3.9 06/07/2014 0601   CL 111 06/07/2014 0601   CO2 24 06/07/2014 0601   GLUCOSE 133* 06/07/2014 0601   BUN 30* 06/07/2014 0601   CREATININE 1.25* 06/07/2014 0601   CALCIUM 8.1* 06/07/2014 0601   PROT 6.6 06/04/2014 0011   ALBUMIN 3.8 06/04/2014 0011   AST 15 06/04/2014 0011   ALT 16 06/04/2014 0011   ALKPHOS 52 06/04/2014 0011   BILITOT 1.0 06/04/2014 0011   GFRNONAA 49* 06/07/2014 0601   GFRAA 56* 06/07/2014 0601      ASSESSMENT AND PLAN  Multiple sclerosis - Plan: CBC with Differential/Platelet, Stratify JCV Antibody Test ([facility]), Stratify JCV  Antibody Test ([facility]), CBC with Differential/Platelet  Gait disturbance  Depression with anxiety  Essential hypertension  Obstructive sleep apnea on CPAP  Vitamin D deficiency  High risk medication use - Plan: CBC with Differential/Platelet, Stratify JCV Antibody Test ([facility]), Stratify JCV Antibody Test ([facility]), CBC with Differential/Platelet    In summary, Rachel Gilbert is a 54 year old woman with relapsing remitting multiple sclerosis with recent exacerbation causing more difficulty with gait and cognition. Her MRI shows 2 new foci on diffusion weighted images. The MRI also appears to show mixed demyelinating and small vessel ischemic changes. We discussed that during her exacerbation last year and this year and changes on MRI that she is having a suboptimal response to Gilenya. It is likely helping her some but not as much as we would like. Therefore, a change in therapy is appropriate. We went over the pros and cons of a switch in therapy from Gilenya to either Tysabri or [place]. She understands the risk of PML with Tysabri and the possible risk stratification with JCV antibody test. She also understands the risk of autoimmune problems with Lemtrada. We will check JCV antibody titer. If she is negative or low titer she will start Tysabri. If she is a middle or high titer she will strongly consider Lemtrada. Regardless of the choice, I will want to wash her out from Gilenya for about one monthI am going to have her and her boyfriend come back in 2-3 weeks so we can further discuss this once the result is in and make sure that she is signed up for the appropriate therapy and that all pretherapy tests have been completed.    She has some difficulty with urinary function and  I will add oxybutynin XR 10 mg.  I will also renew her Adderall. She is going to try to find a psychiatrist locally as hers is 90 minutes away. She will also try to find a primary care doctor much closer to where she  lives.  She will return to see me in 2-3 weeks but will call sooner if she has new or worsening neu.   rologic symptoms.   Woodson Macha A. Epimenio Foot, MD, PhD 06/14/2014, 9:59 AM Certified in Neurology, Clinical Neurophysiology, Sleep Medicine, Pain Medicine and Neuroimaging  [facility] [address] [place], [place] [address] [phone]

## 2014-06-14 NOTE — Telephone Encounter (Signed)
Patient request faxed to [facility] on 06/14/14 requesting a copy of mri brain report, and patient CD to be mailed.

## 2014-06-15 LAB — CBC WITH DIFFERENTIAL/PLATELET
BASOS ABS: 0 10*3/uL (ref 0.0–0.2)
Basos: 0 %
EOS (ABSOLUTE): 0.2 10*3/uL (ref 0.0–0.4)
Eos: 1 %
HEMATOCRIT: 45.9 % (ref 34.0–46.6)
HEMOGLOBIN: 14.9 g/dL (ref 11.1–15.9)
Immature Grans (Abs): 0 10*3/uL (ref 0.0–0.1)
Immature Granulocytes: 0 %
LYMPHS ABS: 1.2 10*3/uL (ref 0.7–3.1)
LYMPHS: 10 %
MCH: 29.1 pg (ref 26.6–33.0)
MCHC: 32.5 g/dL (ref 31.5–35.7)
MCV: 90 fL (ref 79–97)
Monocytes Absolute: 0.1 10*3/uL (ref 0.1–0.9)
Monocytes: 1 %
Neutrophils Absolute: 12.1 10*3/uL — ABNORMAL HIGH (ref 1.4–7.0)
Neutrophils: 88 %
Platelets: 301 10*3/uL (ref 150–379)
RBC: 5.12 x10E6/uL (ref 3.77–5.28)
RDW: 13.5 % (ref 12.3–15.4)
WBC: 13.7 10*3/uL — ABNORMAL HIGH (ref 3.4–10.8)

## 2014-06-27 ENCOUNTER — Telehealth: Payer: Self-pay | Admitting: Neurology

## 2014-06-27 NOTE — Telephone Encounter (Signed)
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 I discussed this with her to let her know that Tysabri and Julaine Hua could both be options for her. I went over a little bit about both medications and she will give this further thought. She understands that on Tysabri that her risk of PML would be in the neighborhood of 1:2000 as she is low positive.   We also discussed thatILemtrada has been associated with some autoimmune issues.  Please call her tomorrow to see if she can get on the schedule for next week to discuss these 2 options in further detail and to sign up for one of them.

## 2014-06-28 NOTE — Telephone Encounter (Signed)
I have spoken with Rachel Gilbert this afternoon and given her an appt for 07-03-14 at 1340 to discuss tx. options for MS/fim

## 2014-07-03 ENCOUNTER — Encounter: Payer: Self-pay | Admitting: Neurology

## 2014-07-03 ENCOUNTER — Ambulatory Visit (INDEPENDENT_AMBULATORY_CARE_PROVIDER_SITE_OTHER): Payer: BLUE CROSS/BLUE SHIELD | Admitting: Neurology

## 2014-07-03 VITALS — BP 160/100 | HR 76 | Resp 18 | Ht 69.0 in | Wt 161.2 lb

## 2014-07-03 DIAGNOSIS — G35 Multiple sclerosis: Secondary | ICD-10-CM | POA: Diagnosis not present

## 2014-07-03 DIAGNOSIS — R269 Unspecified abnormalities of gait and mobility: Secondary | ICD-10-CM | POA: Diagnosis not present

## 2014-07-03 DIAGNOSIS — R350 Frequency of micturition: Secondary | ICD-10-CM | POA: Insufficient documentation

## 2014-07-03 DIAGNOSIS — R5383 Other fatigue: Secondary | ICD-10-CM | POA: Insufficient documentation

## 2014-07-03 MED ORDER — DALFAMPRIDINE ER 10 MG PO TB12: 10.0000 mg | ORAL_TABLET | Freq: Two times a day (BID) | ORAL | Status: DC

## 2014-07-03 NOTE — Progress Notes (Signed)
[facility]  PATIENT: Rachel Gilbert DOB: July 14, 1960  REFERRING DOCTOR OR PCP:  ER  No PCP SOURCE: ER notes, images on PACS, patient  _________________________________   HISTORICAL  CHIEF COMPLAINT:  Chief Complaint  Patient presents with  . Multiple Sclerosis    To discuss tx.  options for MS.  Specifically Lemtrada vs Tysabri.  JCV positive at 0.37/fim    HISTORY OF PRESENT ILLNESS:  Rachel Gilbert is a 54 yo woman with MS diagnosed 9/12 who had an exacerbation 05/2014 and had  breakthrough disease on MRI>   She comes in today to further discuss therapeutic options.   She is low positive JCV AB positive.   We discussed the risks and benefits of Tysabri (1:2000 risk of PML assuming she remains low positive or negative  MS history: She was diagnosed in September 2012 after presenting with diplopia. She was hospitalized and received 5 days of IV steroids and she had MRI and lumbar puncture. She recalls that the lumbar puncture was not consistent with MS but the MRI was.  She was placed on Gilenya in December 2012. She was able to return to work and the diplopia completely resolved. Then in July 2014 she had another exacerbation with visual disturbance bilaterally, gait changes and cognitive changes.  Reportedly, there was a new lesion in the brainstem and also in the spinal cord at that time. She received 5 more days of steroid.    In early May 2016, she had the onset of several symptoms including worse cognitive problems, worse gait problems and weakness in the hands.   She went to the [place] [facility] emergency room on 06/03/2014 and she was evaluated. An admitted and received 3 days of IV Solu-Medrol.The MRI of the brain shows many white matter lesions. Some are periventricular with an appearance consistent with MS. She also has deep white matter subcortical and juxtacortical foci. Some of these look more consistent with small vessel ischemic changes and most more  consistent with multiple sclerosis. She does have new foci in the left vermis of the cerebellum and in the left frontal periventricular region..   Gait/strength/sensation/coordination:   She has difficulty with her gait due to poor balance but is better than last month and almost to her early 2016 baseline. She denies weakness in the legs or arms. She denies any significant numbness in the arms or legs. She has clumsiness in arms and legs bilaterally. Ampyra has helped her gait.     Bladder/bowel: She reports some difficulty with urinary urgency, frequency and nocturia (twice a night). Ditropan XL 10 mg has helped.   .  Vision: She feels her vision is back to baseline.  Fatigue/sleep: She has physical and mental fatigue. The Ampyra has helped her gait endurance as she is able to walk a little further with it. However it has not helped her cognitive skills.   REVIEW OF SYSTEMS: Constitutional: No fevers, chills, sweats, or change in appetite Eyes: No visual changes, double vision, eye pain Ear, nose and throat: No hearing loss, ear pain, nasal congestion, sore throat Cardiovascular: No chest pain, palpitations Respiratory: No shortness of breath at rest or with exertion.   No wheezes GastrointestinaI: No nausea, vomiting, diarrhea, abdominal pain, fecal incontinence Genitourinary: No dysuria, urinary retention or frequency.  No nocturia. Musculoskeletal: No neck pain, back pain Integumentary: No rash, pruritus, skin lesions Neurological: as above Psychiatric: No depression at this time.  No anxiety Endocrine: No palpitations, diaphoresis, change in appetite, change in  weigh or increased thirst Hematologic/Lymphatic: No anemia, purpura, petechiae. Allergic/Immunologic: No itchy/runny eyes, nasal congestion, recent allergic reactions, rashes  ALLERGIES: Allergies  Allergen Reactions  . Oxycodone Anaphylaxis    HOME MEDICATIONS:  Current outpatient prescriptions:  .  acetaminophen  (TYLENOL) 500 MG tablet, Take 500 mg by mouth every 6 (six) hours as needed for mild pain., Disp: , Rfl:  .  amphetamine-dextroamphetamine (ADDERALL) 10 MG tablet, Take 2 po q AM and 1 po 4 hours later, Disp: 90 tablet, Rfl: 0 .  brimonidine (ALPHAGAN P) 0.1 % SOLN, Place 2 drops into the left eye 2 (two) times daily., Disp: , Rfl:  .  carvedilol (COREG) 25 MG tablet, Take 25 mg by mouth 2 (two) times daily with a meal., Disp: , Rfl:  .  dalfampridine 10 MG TB12, Take 10 mg by mouth daily., Disp: , Rfl:  .  doxazosin (CARDURA) 4 MG tablet, Take 4 mg by mouth daily., Disp: , Rfl:  .  Fingolimod HCl 0.5 MG CAPS, Take 1 capsule by mouth daily., Disp: , Rfl:  .  FLUoxetine (PROZAC) 40 MG capsule, Take 1 capsule (40 mg total) by mouth daily., Disp: 30 capsule, Rfl: 3 .  hydrALAZINE (APRESOLINE) 50 MG tablet, Take 50 mg by mouth 2 (two) times daily., Disp: , Rfl:  .  ibuprofen (ADVIL,MOTRIN) 200 MG tablet, Take 200 mg by mouth every 6 (six) hours as needed for moderate pain., Disp: , Rfl:  .  lisinopril (PRINIVIL,ZESTRIL) 40 MG tablet, Take 40 mg by mouth daily., Disp: , Rfl:  .  methylphenidate (RITALIN) 20 MG tablet, Take 20 mg by mouth 2 (two) times daily., Disp: , Rfl:  .  ondansetron (ZOFRAN) 4 MG tablet, Take 4 mg by mouth every 8 (eight) hours as needed for nausea or vomiting., Disp: , Rfl:  .  oxybutynin (DITROPAN XL) 10 MG 24 hr tablet, Take 1 tablet (10 mg total) by mouth at bedtime., Disp: 30 tablet, Rfl: 11 .  ranitidine (ZANTAC) 300 MG capsule, Take 300 mg by mouth every evening., Disp: , Rfl:  .  spironolactone (ALDACTONE) 50 MG tablet, Take 50 mg by mouth daily., Disp: , Rfl:   PAST MEDICAL HISTORY: Past Medical History  Diagnosis Date  . Multiple sclerosis   . Hypertension   . Hearing loss   . Vision abnormalities   . Kidney stones     PAST SURGICAL HISTORY: Past Surgical History  Procedure Laterality Date  . Cesarean section  1986    FAMILY HISTORY: Family History    Problem Relation Age of Onset  . Stroke Other   . Fibromyalgia Mother   . Stroke Father   . Diabetes Father     SOCIAL HISTORY:  History   Social History  . Marital Status: Single    Spouse Name: N/A  . Number of Children: N/A  . Years of Education: N/A   Occupational History  . Not on file.   Social History Main Topics  . Smoking status: Never Smoker   . Smokeless tobacco: Never Used  . Alcohol Use: Yes     Comment: Occasional  . Drug Use: No  . Sexual Activity: Not on file   Other Topics Concern  . Not on file   Social History Narrative     PHYSICAL EXAM  Filed Vitals:   07/03/14 1403  BP: 160/100  Pulse: 76  Resp: 18  Height: 5\' 9"  (1.753 m)  Weight: 161 lb 3.2 oz (73.12 kg)  Body mass index is 23.79 kg/(m^2).   General: The patient is well-developed and well-nourished and in no acute distress   Neurologic Exam  Mental status: The patient is alert and oriented x 3 at the time of the examination.  At times, she is tearful..  The patient has apparent normal recent and remote memory but decreased attention span and concentration ability.   Speech is normal.  Cranial nerves: Extraocular movements are full. Facial symmetry is present. There is good facial sensation to soft touch bilaterally.Facial strength is normal.  Trapezius and sternocleidomastoid strength is normal. No dysarthria is noted.  The tongue is midline, and the patient has symmetric elevation of the soft palate.  Motor:  Muscle bulk is normal.   Tone is normal. Strength is  5 / 5 in all 4 extremities.   Sensory: Sensory testing is intact to pinprick and vibration sensation in all 4 extremities.  Coordination: Cerebellar testing reveals good finger-nose-finger and reduced left heel-to-shin .  Gait and station: Station is normal.   Gait is wide. Tandem gait is poor.. Romberg is negative.   Reflexes: Deep tendon reflexes are symmetric and normal bilaterally.      DIAGNOSTIC DATA  (LABS, IMAGING, TESTING) - I reviewed patient records, labs, notes, testing and imaging myself where available.  Lab Results  Component Value Date   WBC 13.7* 06/14/2014   HGB 14.1 06/07/2014   HCT 45.9 06/14/2014   MCV 90.5 06/07/2014   PLT 211 06/07/2014      Component Value Date/Time   NA 138 06/07/2014 0601   K 3.9 06/07/2014 0601   CL 111 06/07/2014 0601   CO2 24 06/07/2014 0601   GLUCOSE 133* 06/07/2014 0601   BUN 30* 06/07/2014 0601   CREATININE 1.25* 06/07/2014 0601   CALCIUM 8.1* 06/07/2014 0601   PROT 6.6 06/04/2014 0011   ALBUMIN 3.8 06/04/2014 0011   AST 15 06/04/2014 0011   ALT 16 06/04/2014 0011   ALKPHOS 52 06/04/2014 0011   BILITOT 1.0 06/04/2014 0011   GFRNONAA 49* 06/07/2014 0601   GFRAA 56* 06/07/2014 0601      ASSESSMENT AND PLAN  Multiple sclerosis  Gait disturbance  Other fatigue  Urinary frequency  1.   After long discussion of risks and benefits of Tysabri and limb Trotta, she opted to go on Tysabri. I had her fill out the service request form and we will turn it in today. She is advised to immediately stop the Gilenya as I would like her to be off of that medication for 1 month before her first Tysabri infusion. 2.   Continue Ampyra. I will renew.   She will also continue the Adderall. 3.   Continue oxybutynin. When she goes on the road, she can double up for that day. 4.   She will return in 4 months for f/u with me or call sooner if problems   Ladaisha Portillo A. Epimenio Foot, MD, PhD 07/03/2014, 2:07 PM Certified in Neurology, Clinical Neurophysiology, Sleep Medicine, Pain Medicine and Neuroimaging  [facility] [address] [place], [place] [address] [phone]

## 2014-07-05 ENCOUNTER — Encounter: Payer: Self-pay | Admitting: *Deleted

## 2014-07-18 ENCOUNTER — Other Ambulatory Visit: Payer: Self-pay | Admitting: Neurology

## 2014-07-18 ENCOUNTER — Telehealth: Payer: Self-pay | Admitting: Neurology

## 2014-07-18 MED ORDER — AMPHETAMINE-DEXTROAMPHETAMINE 10 MG PO TABS: ORAL_TABLET | ORAL | Status: DC

## 2014-07-18 NOTE — Telephone Encounter (Signed)
Patient is calling for a written Rx Adderall.  She will pick up on Friday.  Thanks

## 2014-07-18 NOTE — Telephone Encounter (Signed)
Request entered, forwarded to provider for approval.  

## 2014-07-18 NOTE — Telephone Encounter (Signed)
Patient called and requested to speak with Faith RN regarding her Rx. ADDERALL. She will run out tomorrow and would like to know if it would be ok to wait until her appt on Friday to pick up the medication. Please call and advise.

## 2014-07-19 ENCOUNTER — Telehealth: Payer: Self-pay | Admitting: Nurse Practitioner

## 2014-07-19 NOTE — Telephone Encounter (Signed)
I have spoken with Rachel Gilbert this afternoon and advised that rx. is available to be picked up now, (up front [facility]), but she may wait until Friday to pick it up if she needs/wants to.  [name] verbalized understanding of same/fim

## 2014-07-21 ENCOUNTER — Encounter: Payer: Self-pay | Admitting: *Deleted

## 2014-07-21 ENCOUNTER — Other Ambulatory Visit: Payer: Self-pay | Admitting: *Deleted

## 2014-07-21 MED ORDER — AMPHETAMINE-DEXTROAMPHETAMINE 10 MG PO TABS: ORAL_TABLET | ORAL | Status: DC

## 2014-07-21 NOTE — Telephone Encounter (Signed)
Post-dated rx. given at appt. for Tysabri infusion/fim

## 2014-09-19 ENCOUNTER — Encounter: Payer: Self-pay | Admitting: *Deleted

## 2014-09-19 ENCOUNTER — Telehealth: Payer: Self-pay | Admitting: *Deleted

## 2014-09-19 ENCOUNTER — Other Ambulatory Visit: Payer: Self-pay | Admitting: *Deleted

## 2014-09-19 ENCOUNTER — Other Ambulatory Visit: Payer: Self-pay

## 2014-09-19 MED ORDER — BACLOFEN 10 MG PO TABS: 10.0000 mg | ORAL_TABLET | Freq: Three times a day (TID) | ORAL | Status: DC | PRN

## 2014-09-19 MED ORDER — AMPHETAMINE-DEXTROAMPHETAMINE 10 MG PO TABS: ORAL_TABLET | ORAL | Status: DC

## 2014-09-19 NOTE — Telephone Encounter (Signed)
Rx has been resent to alternate [facility] per patient request.

## 2014-09-19 NOTE — Telephone Encounter (Signed)
Rachel Gilbert is in for Tysabri infusion, sts. is waking from sleep 3-4 times per wk with incrased leg cramps.  Per RAS, ok for Baclofen  po tid prn.  Rx. escribed to [facility] per [name]'s request/fim

## 2014-09-19 NOTE — Telephone Encounter (Signed)
Pt would like Rx sent to [facility] [address] [place]

## 2014-09-19 NOTE — Telephone Encounter (Signed)
Adderall rx. given at appt. for Tysabri infusion this morning/fim

## 2014-11-01 ENCOUNTER — Ambulatory Visit: Payer: BLUE CROSS/BLUE SHIELD | Admitting: Neurology

## 2014-11-03 ENCOUNTER — Ambulatory Visit: Payer: BLUE CROSS/BLUE SHIELD | Admitting: Neurology

## 2014-11-16 ENCOUNTER — Ambulatory Visit (INDEPENDENT_AMBULATORY_CARE_PROVIDER_SITE_OTHER): Payer: BLUE CROSS/BLUE SHIELD | Admitting: Neurology

## 2014-11-16 ENCOUNTER — Encounter: Payer: Self-pay | Admitting: Neurology

## 2014-11-16 VITALS — BP 156/100 | HR 78 | Resp 18 | Ht 69.0 in | Wt 259.4 lb

## 2014-11-16 DIAGNOSIS — G4700 Insomnia, unspecified: Secondary | ICD-10-CM | POA: Diagnosis not present

## 2014-11-16 DIAGNOSIS — R5383 Other fatigue: Secondary | ICD-10-CM | POA: Diagnosis not present

## 2014-11-16 DIAGNOSIS — G35 Multiple sclerosis: Secondary | ICD-10-CM

## 2014-11-16 DIAGNOSIS — R269 Unspecified abnormalities of gait and mobility: Secondary | ICD-10-CM

## 2014-11-16 DIAGNOSIS — F418 Other specified anxiety disorders: Secondary | ICD-10-CM | POA: Diagnosis not present

## 2014-11-16 DIAGNOSIS — Z9989 Dependence on other enabling machines and devices: Secondary | ICD-10-CM

## 2014-11-16 DIAGNOSIS — G4733 Obstructive sleep apnea (adult) (pediatric): Secondary | ICD-10-CM | POA: Diagnosis not present

## 2014-11-16 DIAGNOSIS — R350 Frequency of micturition: Secondary | ICD-10-CM | POA: Diagnosis not present

## 2014-11-16 MED ORDER — TRAZODONE HCL 100 MG PO TABS: 100.0000 mg | ORAL_TABLET | Freq: Every day | ORAL | Status: DC

## 2014-11-16 MED ORDER — AMPHETAMINE-DEXTROAMPHETAMINE 10 MG PO TABS: ORAL_TABLET | ORAL | Status: DC

## 2014-11-16 NOTE — Progress Notes (Addendum)
[facility]  PATIENT: Rachel Gilbert DOB: [birth year]  REFERRING DOCTOR OR PCP:  ER  No PCP SOURCE: ER notes, images on PACS, patient  _________________________________   HISTORICAL  CHIEF COMPLAINT:  Chief Complaint  Patient presents with  . Multiple Sclerosis    Sts. she is tolerating Tysabri well.  JCV ab last checked 06-14-14 and was positive at 0.37.  She is under more stress since recent unexpected death of her fiance.  Sts. fatigue is worse, not sleeping as well.  Sts. is having trouble with cpap machine--thinks mask is leaking.  No current download./fim    HISTORY OF PRESENT ILLNESS:  Rachel Gilbert is a 54 yo woman with MS.  After an exacerbation in 05/2014 she switched to Tysabri .   She has had 4 infusions.   She is low positive JCV AB positive.   We discussed the risks and benefits of Tysabri (1:2000 risk of PML assuming she remains low positive or negative  MS history: She was diagnosed in September 2012 after presenting with diplopia. She was hospitalized and received 5 days of IV steroids and she had MRI and lumbar puncture. She recalls that the lumbar puncture was not consistent with MS but the MRI was.  She was placed on Gilenya in December 2012. She was able to return to work and the diplopia completely resolved. Then in July 2014 she had another exacerbation with visual disturbance bilaterally, gait changes and cognitive changes.  Reportedly, there was a new lesion in the brainstem and also in the spinal cord at that time. She received 5 more days of steroid.    In early May 2016, she had the onset of several symptoms including worse cognitive problems, worse gait problems and weakness in the hands.   She went to the  regional emergency room on 06/03/2014 and she was evaluated. An admitted and received 3 days of IV Solu-Medrol.The MRI of the brain shows many white matter lesions. Some are periventricular with an appearance consistent with MS. She also  has deep white matter subcortical and juxtacortical foci. Some of these look more consistent with small vessel ischemic changes and most more consistent with multiple sclerosis. She does have new foci in the left vermis of the cerebellum and in the left frontal periventricular region..   Gait/strength/sensation/coordination:   She has poor balance but is better than at her last visit. She uses a cane and has trouble on uneven surfaces.   . She denies much weakness in the legs or arms. She denies any numbness in the arms or legs. She has clumsiness in arms and legs bilaterally, worse than the strength issues. Ampyra has helped her gait.     Bladder/bowel: She reports some difficulty with urinary urgency, frequency and nocturia (twice a night). Ditropan XL 10 mg has helped.   .  Vision: She feels her vision is back to baseline.  Fatigue/sleep: She has physical and mental fatigue.  Fatigue is better on Tysabri.   The Ampyra has helped her gait endurance as she is able to walk a little further with it.     She is having more insomnia, both sleep onset and sleep maintenance.   Mood/Cognition:   Her boyfriend died recently in a motorcycle accident and she was tearful x 1 today.    She feels her cognitive fog is reduced since being on Tysabri.    She gets less frustrated.     REVIEW OF SYSTEMS: Constitutional: No fevers, chills, sweats, or  change in appetite.   She has fatigue Eyes: No visual changes, double vision, eye pain Ear, nose and throat: No hearing loss, ear pain, nasal congestion, sore throat Cardiovascular: No chest pain, palpitations Respiratory: No shortness of breath at rest or with exertion.   No wheezes GastrointestinaI: No nausea, vomiting, diarrhea, abdominal pain, fecal incontinence Genitourinary: No dysuria, urinary retention.   Frequency better on medications. Musculoskeletal: No neck pain, back pain Integumentary: No rash, pruritus, skin lesions Neurological: as  above Psychiatric: No depression at this time.  No anxiety Endocrine: No palpitations, diaphoresis, change in appetite, change in weigh or increased thirst Hematologic/Lymphatic: No anemia, purpura, petechiae. Allergic/Immunologic: No itchy/runny eyes, nasal congestion, recent allergic reactions, rashes  ALLERGIES: Allergies  Allergen Reactions  . Oxycodone Anaphylaxis    HOME MEDICATIONS:  Current outpatient prescriptions:  .  acetaminophen (TYLENOL) 500 MG tablet, Take 500 mg by mouth every 6 (six) hours as needed for mild pain., Disp: , Rfl:  .  amphetamine-dextroamphetamine (ADDERALL) 10 MG tablet, Take 2 po q AM and 1 po 4 hours later, Disp: 90 tablet, Rfl: 0 .  baclofen (LIORESAL) 10 MG tablet, Take 1 tablet (10 mg total) by mouth 3 (three) times daily as needed for muscle spasms., Disp: 90 each, Rfl: 3 .  brimonidine (ALPHAGAN P) 0.1 % SOLN, Place 2 drops into the left eye 2 (two) times daily., Disp: , Rfl:  .  carvedilol (COREG) 25 MG tablet, Take 25 mg by mouth 2 (two) times daily with a meal., Disp: , Rfl:  .  dalfampridine 10 MG TB12, Take 1 tablet (10 mg total) by mouth 2 (two) times daily., Disp: 60 tablet, Rfl: 11 .  doxazosin (CARDURA) 4 MG tablet, Take 4 mg by mouth daily., Disp: , Rfl:  .  FLUoxetine (PROZAC) 40 MG capsule, Take 1 capsule (40 mg total) by mouth daily., Disp: 30 capsule, Rfl: 3 .  hydrALAZINE (APRESOLINE) 50 MG tablet, Take 50 mg by mouth 2 (two) times daily., Disp: , Rfl:  .  ibuprofen (ADVIL,MOTRIN) 200 MG tablet, Take 200 mg by mouth every 6 (six) hours as needed for moderate pain., Disp: , Rfl:  .  lisinopril (PRINIVIL,ZESTRIL) 40 MG tablet, Take 40 mg by mouth daily., Disp: , Rfl:  .  natalizumab (TYSABRI) 300 MG/15ML injection, Inject into the vein., Disp: , Rfl:  .  ondansetron (ZOFRAN) 4 MG tablet, Take 4 mg by mouth every 8 (eight) hours as needed for nausea or vomiting., Disp: , Rfl:  .  oxybutynin (DITROPAN XL) 10 MG 24 hr tablet, Take 1 tablet  (10 mg total) by mouth at bedtime., Disp: 30 tablet, Rfl: 11 .  ranitidine (ZANTAC) 300 MG capsule, Take 300 mg by mouth every evening., Disp: , Rfl:  .  spironolactone (ALDACTONE) 50 MG tablet, Take 50 mg by mouth daily., Disp: , Rfl:  .  methylphenidate (RITALIN) 20 MG tablet, Take 20 mg by mouth 2 (two) times daily., Disp: , Rfl:   PAST MEDICAL HISTORY: Past Medical History  Diagnosis Date  . Multiple sclerosis (HCC)   . Hypertension   . Hearing loss   . Vision abnormalities   . Kidney stones     PAST SURGICAL HISTORY: Past Surgical History  Procedure Laterality Date  . Cesarean section  1986    FAMILY HISTORY: Family History  Problem Relation Age of Onset  . Stroke Other   . Fibromyalgia Mother   . Stroke Father   . Diabetes Father     SOCIAL  HISTORY:  Social History   Social History  . Marital Status: Single    Spouse Name: N/A  . Number of Children: N/A  . Years of Education: N/A   Occupational History  . Not on file.   Social History Main Topics  . Smoking status: Never Smoker   . Smokeless tobacco: Never Used  . Alcohol Use: Yes     Comment: Occasional  . Drug Use: No  . Sexual Activity: Not on file   Other Topics Concern  . Not on file   Social History Narrative     PHYSICAL EXAM  Filed Vitals:   11/16/14 1014  BP: 156/100  Pulse: 78  Resp: 18  Height:  (1.753 m)  Weight: 259 lb 6.4 oz (117.663 kg)    Body mass index is 38.29 kg/(m^2).   General: The patient is well-developed and well-nourished and in no acute distress   Neurologic Exam  Mental status: The patient is alert and oriented x 3 at the time of the examination.  At times, she is tearful..  The patient has apparent normal recent and remote memory but decreased attention span and concentration ability.   Speech is normal.  Cranial nerves: Extraocular movements are full. Facial symmetry is present. There is good facial sensation to soft touch bilaterally.Facial  strength is normal.  Trapezius and sternocleidomastoid strength is normal. No dysarthria is noted.  The tongue is midline, and the patient has symmetric elevation of the soft palate.  Motor:  Muscle bulk is normal.   Tone is normal. Strength is  5 / 5 in all 4 extremities.   Sensory: Sensory testing is intact to pinprick and vibration sensation in all 4 extremities.  Coordination: Cerebellar testing reveals good finger-nose-finger and reduced left heel-to-shin .  Gait and station: Station is normal.   Gait is wide. Tandem gait is poor.. Romberg is negative.   Reflexes: Deep tendon reflexes are symmetric and normal bilaterally.      DIAGNOSTIC DATA (LABS, IMAGING, TESTING) - I reviewed patient records, labs, notes, testing and imaging myself where available.  Lab Results  Component Value Date   WBC 13.7* 06/14/2014   HGB 14.1 06/07/2014   HCT 45.9 06/14/2014   MCV 90.5 06/07/2014   PLT 211 06/07/2014      Component Value Date/Time   NA 138 06/07/2014 0601   K 3.9 06/07/2014 0601   CL 111 06/07/2014 0601   CO2 24 06/07/2014 0601   GLUCOSE 133* 06/07/2014 0601   BUN 30* 06/07/2014 0601   CREATININE 1.25* 06/07/2014 0601   CALCIUM 8.1* 06/07/2014 0601   PROT 6.6 06/04/2014 0011   ALBUMIN 3.8 06/04/2014 0011   AST 15 06/04/2014 0011   ALT 16 06/04/2014 0011   ALKPHOS 52 06/04/2014 0011   BILITOT 1.0 06/04/2014 0011   GFRNONAA 49* 06/07/2014 0601   GFRAA 56* 06/07/2014 0601      ASSESSMENT AND PLAN  Multiple sclerosis (HCC) - Plan: MR Brain W Wo Contrast, Stratify JCV Antibody Test ([facility]), CBC with Differential/Platelet, Hepatic function panel  Obstructive sleep apnea on CPAP  Gait disturbance - Plan: MR Brain W Wo Contrast  Other fatigue - Plan: CBC with Differential/Platelet, Hepatic function panel  Depression with anxiety  Urinary frequency  Insomnia   1.   After long discussion of risks and benefits of Tysabri and [place], she opted to go on Tysabri. I  had her fill out the service request form and we will turn it in today. She  is advised to immediately stop the Gilenya as I would like her to be off of that medication for 1 month before her first Tysabri infusion. 2.   Continue Ampyra. I will renew.   She will also continue the Adderall. 3.   Continue oxybutynin. When she goes on the road, she can double up for that day. 4.   She will return in 4 months for f/u with me or call sooner if problems   Richard A. Epimenio Foot, MD, PhD 11/16/2014, 10:18 AM Certified in Neurology, Clinical Neurophysiology, Sleep Medicine, Pain Medicine and Neuroimaging  [facility] [address] [place], [place] [address] [phone]

## 2014-11-17 ENCOUNTER — Other Ambulatory Visit: Payer: Self-pay | Admitting: *Deleted

## 2014-11-17 DIAGNOSIS — G4733 Obstructive sleep apnea (adult) (pediatric): Secondary | ICD-10-CM

## 2014-11-17 LAB — CBC WITH DIFFERENTIAL/PLATELET
BASOS: 0 %
Basophils Absolute: 0 10*3/uL (ref 0.0–0.2)
EOS (ABSOLUTE): 0.4 10*3/uL (ref 0.0–0.4)
EOS: 3 %
Hematocrit: 44.1 % (ref 34.0–46.6)
Hemoglobin: 14.7 g/dL (ref 11.1–15.9)
Immature Grans (Abs): 0.1 10*3/uL (ref 0.0–0.1)
Immature Granulocytes: 1 %
LYMPHS ABS: 2.6 10*3/uL (ref 0.7–3.1)
Lymphs: 19 %
MCH: 29.9 pg (ref 26.6–33.0)
MCHC: 33.3 g/dL (ref 31.5–35.7)
MCV: 90 fL (ref 79–97)
MONOS ABS: 0.7 10*3/uL (ref 0.1–0.9)
Monocytes: 5 %
NEUTROS ABS: 9.7 10*3/uL — AB (ref 1.4–7.0)
Neutrophils: 72 %
PLATELETS: 263 10*3/uL (ref 150–379)
RBC: 4.92 x10E6/uL (ref 3.77–5.28)
RDW: 14.9 % (ref 12.3–15.4)
WBC: 13.5 10*3/uL — ABNORMAL HIGH (ref 3.4–10.8)

## 2014-11-17 LAB — HEPATIC FUNCTION PANEL
ALK PHOS: 60 IU/L (ref 39–117)
ALT: 16 IU/L (ref 0–32)
AST: 14 IU/L (ref 0–40)
Albumin: 4.5 g/dL (ref 3.5–5.5)
Bilirubin Total: 1.2 mg/dL (ref 0.0–1.2)
Bilirubin, Direct: 0.31 mg/dL (ref 0.00–0.40)
TOTAL PROTEIN: 6.1 g/dL (ref 6.0–8.5)

## 2014-11-24 NOTE — Telephone Encounter (Signed)
Error

## 2014-11-27 ENCOUNTER — Encounter: Payer: Self-pay | Admitting: *Deleted

## 2014-11-29 DIAGNOSIS — G35 Multiple sclerosis: Secondary | ICD-10-CM | POA: Diagnosis not present

## 2014-11-30 ENCOUNTER — Ambulatory Visit (INDEPENDENT_AMBULATORY_CARE_PROVIDER_SITE_OTHER): Payer: Self-pay

## 2014-11-30 DIAGNOSIS — G35 Multiple sclerosis: Secondary | ICD-10-CM

## 2014-11-30 DIAGNOSIS — R269 Unspecified abnormalities of gait and mobility: Secondary | ICD-10-CM

## 2014-11-30 DIAGNOSIS — Z0289 Encounter for other administrative examinations: Secondary | ICD-10-CM

## 2014-12-01 ENCOUNTER — Telehealth: Payer: Self-pay | Admitting: *Deleted

## 2014-12-01 NOTE — Telephone Encounter (Signed)
I have spoken with Rachel Gilbert this afternoon and per RAS, advised that mri brain shows no new MS changes.  She verbalized understanding of same/fim

## 2014-12-01 NOTE — Telephone Encounter (Signed)
-----   Message from Asa Lente, MD sent at 11/30/2014  6:00 PM EDT ----- Please let her know that the MRI does not show any new findings.

## 2014-12-13 ENCOUNTER — Other Ambulatory Visit: Payer: Self-pay | Admitting: *Deleted

## 2014-12-13 MED ORDER — AMPHETAMINE-DEXTROAMPHETAMINE 10 MG PO TABS: ORAL_TABLET | ORAL | Status: DC

## 2014-12-13 NOTE — Telephone Encounter (Signed)
Pt. requested r/f of Adderall while at infusion appt. today.  Rx. printed, signed, up front [facility]. (RAS is ooo this afternoon, so pt. will come back and pick rx. up tomorrow)/fim

## 2014-12-25 ENCOUNTER — Encounter: Payer: Self-pay | Admitting: *Deleted

## 2014-12-25 ENCOUNTER — Telehealth: Payer: Self-pay | Admitting: *Deleted

## 2014-12-25 NOTE — Telephone Encounter (Signed)
-----   Message from Asa Lente, MD sent at 12/21/2014 10:51 AM EST ----- JCV is borderline positive (which we' already knew so patient doesn't need to be called unless scheduling/auth issue) Is she scheduled for her Tysabri?

## 2014-12-25 NOTE — Telephone Encounter (Signed)
I have spoken with Rachel Gilbert this morning, and per RAS, advised that JCV ab is still borderline positive.  She verbalized understanding of same.  She is sched. for her next Tysabri infusion on 01-10-15/fim

## 2015-01-10 ENCOUNTER — Other Ambulatory Visit: Payer: Self-pay | Admitting: *Deleted

## 2015-01-10 MED ORDER — AMPHETAMINE-DEXTROAMPHETAMINE 10 MG PO TABS: ORAL_TABLET | ORAL | Status: DC

## 2015-01-10 NOTE — Telephone Encounter (Signed)
Adderall rx. provided at infusion appt/fim 

## 2015-02-07 ENCOUNTER — Encounter: Payer: Self-pay | Admitting: *Deleted

## 2015-02-08 ENCOUNTER — Telehealth: Payer: Self-pay | Admitting: *Deleted

## 2015-02-08 NOTE — Telephone Encounter (Signed)
PA's for Adderall and Ampyra faxed to [facility], fax # [phone]/fim

## 2015-02-08 NOTE — Telephone Encounter (Signed)
LMTC.  I am working on [employer] for Adderall, Ampyra, Zofran, and need to confirm her address/fim

## 2015-02-08 NOTE — Telephone Encounter (Signed)
Pt called and states that the address we have for her is correct.

## 2015-02-09 ENCOUNTER — Encounter: Payer: Self-pay | Admitting: *Deleted

## 2015-02-12 ENCOUNTER — Ambulatory Visit (INDEPENDENT_AMBULATORY_CARE_PROVIDER_SITE_OTHER): Payer: Medicare HMO | Admitting: Neurology

## 2015-02-12 ENCOUNTER — Encounter: Payer: Self-pay | Admitting: Neurology

## 2015-02-12 ENCOUNTER — Encounter: Payer: Self-pay | Admitting: *Deleted

## 2015-02-12 VITALS — BP 180/104 | HR 92 | Resp 16 | Ht 69.0 in | Wt 259.2 lb

## 2015-02-12 DIAGNOSIS — R269 Unspecified abnormalities of gait and mobility: Secondary | ICD-10-CM

## 2015-02-12 DIAGNOSIS — G35 Multiple sclerosis: Secondary | ICD-10-CM | POA: Diagnosis not present

## 2015-02-12 DIAGNOSIS — F482 Pseudobulbar affect: Secondary | ICD-10-CM | POA: Diagnosis not present

## 2015-02-12 DIAGNOSIS — G4700 Insomnia, unspecified: Secondary | ICD-10-CM

## 2015-02-12 DIAGNOSIS — Z9989 Dependence on other enabling machines and devices: Secondary | ICD-10-CM

## 2015-02-12 DIAGNOSIS — G4733 Obstructive sleep apnea (adult) (pediatric): Secondary | ICD-10-CM | POA: Diagnosis not present

## 2015-02-12 DIAGNOSIS — R350 Frequency of micturition: Secondary | ICD-10-CM | POA: Diagnosis not present

## 2015-02-12 MED ORDER — AMPHETAMINE-DEXTROAMPHETAMINE 10 MG PO TABS: ORAL_TABLET | ORAL | Status: DC

## 2015-02-12 MED ORDER — DEXTROMETHORPHAN-QUINIDINE 20-10 MG PO CAPS: ORAL_CAPSULE | ORAL | Status: DC
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
## 2015-02-13 LAB — BASIC METABOLIC PANEL
BUN / CREAT RATIO: 13 (ref 9–23)
BUN: 15 mg/dL (ref 6–24)
CHLORIDE: 105 mmol/L (ref 96–106)
CO2: 20 mmol/L (ref 18–29)
Calcium: 9.5 mg/dL (ref 8.7–10.2)
Creatinine, Ser: 1.13 mg/dL — ABNORMAL HIGH (ref 0.57–1.00)
GFR, EST AFRICAN AMERICAN: 64 mL/min/{1.73_m2} (ref >59)
GFR, EST NON AFRICAN AMERICAN: 56 mL/min/{1.73_m2} — AB (ref >59)
Glucose: 97 mg/dL (ref 65–99)
POTASSIUM: 4.2 mmol/L (ref 3.5–5.2)
Sodium: 144 mmol/L (ref 134–144)

## 2015-02-14 ENCOUNTER — Telehealth: Payer: Self-pay | Admitting: *Deleted

## 2015-02-14 ENCOUNTER — Telehealth: Payer: Self-pay | Admitting: Neurology

## 2015-02-14 ENCOUNTER — Ambulatory Visit (INDEPENDENT_AMBULATORY_CARE_PROVIDER_SITE_OTHER): Payer: Medicare HMO | Admitting: Psychology

## 2015-02-14 DIAGNOSIS — F482 Pseudobulbar affect: Secondary | ICD-10-CM | POA: Insufficient documentation

## 2015-02-14 DIAGNOSIS — F331 Major depressive disorder, recurrent, moderate: Secondary | ICD-10-CM | POA: Diagnosis not present

## 2015-02-14 NOTE — Telephone Encounter (Signed)
noted/fim 

## 2015-02-14 NOTE — Telephone Encounter (Signed)
[facility].  I will refer her to the [facility] Patient Assistance Program phone # [phone]/fim

## 2015-02-14 NOTE — Telephone Encounter (Signed)
Patient called to advise Dr. Epimenio Foot prescribed Dextromethorphan-Quinidine (NUEDEXTA) 20-10 MG CAPS on Monday when she was here and Dr. Epimenio Foot gave her a $30 copay coupon, states they won't let her use the copay coupon card since she has [employer] PPO, her costs after insurance will be $185.

## 2015-02-14 NOTE — Telephone Encounter (Signed)
Pt returned Faith's call. Message relayed. Pt did not have any questions and was very thankful for information provided by [facility]

## 2015-02-15 ENCOUNTER — Telehealth: Payer: Self-pay | Admitting: *Deleted

## 2015-02-15 NOTE — Telephone Encounter (Signed)
Release faxed to Dr. Tessie Eke requesting records on 02/15/15.

## 2015-02-19 NOTE — Telephone Encounter (Signed)
I have spoken with Rachel Gilbert this morning and advised she may try 2 other sites for help with Nuedexta copay--1--needymeds.com, and copays.org/fim

## 2015-02-19 NOTE — Telephone Encounter (Addendum)
Pt called and states that the Patient Assist.Program will not cover the drug b/c the pt has MS. She wants to know if there are any other suggestion for her to try or do. Please call and advise [phone]

## 2015-02-22 NOTE — Telephone Encounter (Signed)
Patient is calling. She says she has tried six places to get Nuedexta but the she cannot afford the co-pay. The patient states she will not be taking the medication. A returned call is not needed unless you need to speak with her.

## 2015-02-22 NOTE — Telephone Encounter (Signed)
I have spoken with Rachel Gilbert--ins. approved Nuedexta, but her copay will be $185, so she is unable to afford it.  She has tried all of the resources I have given her--Nuedexta pt. assistance, needymeds.com, copays.org, [employer], [employer] [facility], and sts. she is has been declined due to income/fim

## 2015-02-27 ENCOUNTER — Telehealth: Payer: Self-pay | Admitting: Neurology

## 2015-02-27 NOTE — Telephone Encounter (Signed)
Infusions are covered under [employer], but the amt. they cover is $2200 less per infusion than the cost to [facility] to infuse.  I have printed this task and given it to [place] in the infusion suite./fim

## 2015-02-27 NOTE — Telephone Encounter (Signed)
Rachel Gilbert [phone] (independant rep) representing [employer] called said pt called her and said she could not have infusion anymore at [facility] that [employer] will not pay for anymore per Faith. Rachel Gilbert does not understand as [facility] is in network. Please call to clarify.

## 2015-03-01 ENCOUNTER — Encounter: Payer: Self-pay | Admitting: *Deleted

## 2015-03-01 NOTE — Telephone Encounter (Signed)
I have spoken with [name]--Tysabri orders have been faxed to [facility].  I have spoken with the Touch program and requested her infusion site auth be pulled to [employer].  I have spoken with [place] at [facility].  As soon as she received Touch auth. she will call me with an appt. for Rachel Gilbert, then I will all Rachel Gilbert.  She verbalized understanding of same/fim

## 2015-03-01 NOTE — Telephone Encounter (Signed)
Patient called to check status of next infusion that would be due Wednesday 03/07/15, will it be done at [facility]? Please call to advise [phone].

## 2015-03-06 ENCOUNTER — Ambulatory Visit (INDEPENDENT_AMBULATORY_CARE_PROVIDER_SITE_OTHER): Payer: Medicare HMO | Admitting: Psychology

## 2015-03-06 DIAGNOSIS — F331 Major depressive disorder, recurrent, moderate: Secondary | ICD-10-CM | POA: Diagnosis not present

## 2015-03-08 ENCOUNTER — Telehealth: Payer: Self-pay | Admitting: Neurology

## 2015-03-08 NOTE — Telephone Encounter (Signed)
Patient is calling back to discuss the infusion that she has to have at [facility].

## 2015-03-08 NOTE — Telephone Encounter (Signed)
LMOM (identified vm).  Per Geraldine Contras at [facility], she has an appt. there for Tysabri infusion on 03-15-15 at 0900.  Just wanted to make sure pt. was aware of this appt/fim

## 2015-03-12 ENCOUNTER — Telehealth: Payer: Self-pay | Admitting: Neurology

## 2015-03-12 NOTE — Telephone Encounter (Signed)
Veronica/Ampyra [phone] called to advise, form has been faxed to [place] today for dalfampridine 10 MG TB12, patient has enrolled in Patient Assistance Program, needs Dr. Epimenio Foot to fill out form and fax back to [phone]. Rx needs to be renewed.

## 2015-03-12 NOTE — Telephone Encounter (Signed)
Pt called and would like to discuss what she needs to do when going to [facility] for infusion. Please call pt and advise [phone]

## 2015-03-13 NOTE — Telephone Encounter (Signed)
Pt. assistance form received, completed, and faxed back to Ampyra pt. assistance program fax # [phone]/fim

## 2015-03-14 ENCOUNTER — Ambulatory Visit: Payer: Medicare HMO | Admitting: Psychology

## 2015-03-15 ENCOUNTER — Encounter (INDEPENDENT_AMBULATORY_CARE_PROVIDER_SITE_OTHER): Payer: Self-pay

## 2015-03-15 ENCOUNTER — Encounter (HOSPITAL_COMMUNITY)
Admission: RE | Admit: 2015-03-15 | Discharge: 2015-03-15 | Disposition: A | Discharge status: Home / Self Care | Payer: Medicare HMO | Source: Ambulatory Visit | Attending: Neurology | Admitting: Neurology

## 2015-03-15 ENCOUNTER — Encounter (HOSPITAL_COMMUNITY): Payer: Self-pay

## 2015-03-15 ENCOUNTER — Other Ambulatory Visit (HOSPITAL_COMMUNITY): Payer: Self-pay | Admitting: Neurology

## 2015-03-15 DIAGNOSIS — G35 Multiple sclerosis: Secondary | ICD-10-CM | POA: Insufficient documentation

## 2015-03-15 HISTORY — DX: Other complications of anesthesia, initial encounter: T88.59XA

## 2015-03-15 HISTORY — DX: Adverse effect of unspecified anesthetic, initial encounter: T41.45XA

## 2015-03-15 HISTORY — DX: Sleep apnea, unspecified: G47.30

## 2015-03-15 MED ORDER — SODIUM CHLORIDE 0.9 % IV SOLN
300.0000 mg | INTRAVENOUS | Status: DC
  Administered 2015-03-15: 300 mg via INTRAVENOUS
  Filled 2015-03-15: qty 15

## 2015-03-15 MED ORDER — SODIUM CHLORIDE 0.9 % IV SOLN
INTRAVENOUS | Status: DC
  Administered 2015-03-15: 09:00:00 via INTRAVENOUS

## 2015-03-15 NOTE — Discharge Instructions (Signed)

## 2015-03-15 NOTE — Progress Notes (Signed)
Tolerated Tysabri. Patient only wants to stay 30 minutes  Of  post 1 hour observation time

## 2015-03-19 ENCOUNTER — Encounter: Payer: Self-pay | Admitting: *Deleted

## 2015-03-21 ENCOUNTER — Ambulatory Visit: Payer: BLUE CROSS/BLUE SHIELD | Admitting: Neurology

## 2015-03-30 ENCOUNTER — Ambulatory Visit: Payer: Medicare HMO | Admitting: Psychology

## 2015-04-13 ENCOUNTER — Encounter (HOSPITAL_COMMUNITY): Payer: Self-pay

## 2015-04-13 ENCOUNTER — Encounter (HOSPITAL_COMMUNITY)
Admission: RE | Admit: 2015-04-13 | Discharge: 2015-04-13 | Disposition: A | Discharge status: Home / Self Care | Payer: Medicare HMO | Source: Ambulatory Visit | Attending: Neurology | Admitting: Neurology

## 2015-04-13 DIAGNOSIS — G35 Multiple sclerosis: Secondary | ICD-10-CM | POA: Diagnosis not present

## 2015-04-13 MED ORDER — SODIUM CHLORIDE 0.9 % IV SOLN
INTRAVENOUS | Status: DC
  Administered 2015-04-13: 10:00:00 via INTRAVENOUS

## 2015-04-13 MED ORDER — SODIUM CHLORIDE 0.9 % IV SOLN
300.0000 mg | INTRAVENOUS | Status: DC
  Administered 2015-04-13: 300 mg via INTRAVENOUS
  Filled 2015-04-13: qty 15

## 2015-04-13 NOTE — Progress Notes (Signed)
Uneventful infusion of Tysabri. Pt stayed [facility] minutes of the [facility] suggested 1 hour post infusion observation. Pt will contact her neurologist for any questions or concerns

## 2015-04-25 NOTE — Telephone Encounter (Signed)
Error

## 2015-05-11 ENCOUNTER — Encounter (HOSPITAL_COMMUNITY)
Admission: RE | Admit: 2015-05-11 | Discharge: 2015-05-11 | Disposition: A | Discharge status: Home / Self Care | Payer: Medicare HMO | Source: Ambulatory Visit | Attending: Neurology | Admitting: Neurology

## 2015-05-11 ENCOUNTER — Telehealth: Payer: Self-pay | Admitting: *Deleted

## 2015-05-11 ENCOUNTER — Encounter (HOSPITAL_COMMUNITY): Payer: Self-pay

## 2015-05-11 DIAGNOSIS — G35 Multiple sclerosis: Secondary | ICD-10-CM | POA: Insufficient documentation

## 2015-05-11 MED ORDER — SODIUM CHLORIDE 0.9 % IV SOLN
INTRAVENOUS | Status: AC
  Administered 2015-05-11: 11:00:00 via INTRAVENOUS

## 2015-05-11 MED ORDER — SODIUM CHLORIDE 0.9 % IV SOLN
300.0000 mg | INTRAVENOUS | Status: AC
  Administered 2015-05-11: 300 mg via INTRAVENOUS
  Filled 2015-05-11: qty 15

## 2015-05-11 NOTE — Progress Notes (Signed)
patient has had sinusitis/bronchitis w 2 rounds of antibiotics and prednisone, ( amoxicillin and augmentin) Called and spoke to [place] at [facility]

## 2015-05-11 NOTE — Telephone Encounter (Signed)
Pt at [facility] for tysabri infusion.  She has had 2 ABX for sinus infection, bronchitis (prednisone, amoxicillin then Augmentin , finished 05-07-15).  No fever, cough resolved.  Just tired-sluggish.  Asking about getting tysabri today.  Consulted Dr. Epimenio Foot if no fever for 24hours ok to proceed with infusion. Relayed to Tammy at [facility].

## 2015-06-06 ENCOUNTER — Encounter: Payer: Self-pay | Admitting: Neurology

## 2015-06-06 ENCOUNTER — Ambulatory Visit (INDEPENDENT_AMBULATORY_CARE_PROVIDER_SITE_OTHER): Payer: Medicare HMO | Admitting: Neurology

## 2015-06-06 ENCOUNTER — Other Ambulatory Visit: Payer: Self-pay | Admitting: *Deleted

## 2015-06-06 ENCOUNTER — Telehealth: Payer: Self-pay | Admitting: Neurology

## 2015-06-06 VITALS — BP 126/86 | HR 68 | Resp 16 | Ht 69.0 in | Wt 270.0 lb

## 2015-06-06 DIAGNOSIS — F418 Other specified anxiety disorders: Secondary | ICD-10-CM

## 2015-06-06 DIAGNOSIS — G35 Multiple sclerosis: Secondary | ICD-10-CM

## 2015-06-06 DIAGNOSIS — R5383 Other fatigue: Secondary | ICD-10-CM | POA: Diagnosis not present

## 2015-06-06 DIAGNOSIS — Z9989 Dependence on other enabling machines and devices: Secondary | ICD-10-CM

## 2015-06-06 DIAGNOSIS — R269 Unspecified abnormalities of gait and mobility: Secondary | ICD-10-CM

## 2015-06-06 DIAGNOSIS — G4733 Obstructive sleep apnea (adult) (pediatric): Secondary | ICD-10-CM

## 2015-06-06 DIAGNOSIS — R350 Frequency of micturition: Secondary | ICD-10-CM | POA: Diagnosis not present

## 2015-06-06 DIAGNOSIS — F482 Pseudobulbar affect: Secondary | ICD-10-CM | POA: Diagnosis not present

## 2015-06-06 MED ORDER — PHENTERMINE HCL 37.5 MG PO CAPS: 37.5000 mg | ORAL_CAPSULE | ORAL | Status: DC

## 2015-06-06 NOTE — Telephone Encounter (Signed)
Placed hospital orders.  Spoke to [place].

## 2015-06-06 NOTE — Telephone Encounter (Signed)
Dee[facility] [phone] called said she did not rec orders for tysabri. Please fax again to [phone].

## 2015-06-06 NOTE — Progress Notes (Signed)
[facility]  PATIENT: Rachel Gilbert DOB: Jun 15, 1960  REFERRING DOCTOR OR PCP:  ER  No PCP SOURCE: ER notes, images on PACS, patient  _________________________________   HISTORICAL  CHIEF COMPLAINT:  Chief Complaint  Patient presents with  . Multiple Sclerosis    Sts. she continues to tolerate Tysabri well.  Her infusions are done at [facility] short stay.  JCV ab last checked 11-16-14 and was positive at 0.37.  She would like ot discuss switching from Adderall to Ritalin, due to cost.  She is not taking Nuedexta due to cost.  She applied for pt. assistance to cover the cost but was denied./fim    HISTORY OF PRESENT ILLNESS:  Rachel Gilbert is a 55 yo woman with MS.  After an exacerbation in 05/2014 she switched to Tysabri .   She has had 11 infusions.   She is low positive JCV AB positive.   We discussed the risks and benefits of Tysabri (1:2000 risk of PML assuming she remains low positive or negative.   We will recheck the titer today.      Gait/strength/sensation/coordination:   She has poor balance and poor gait and uses a cane for balance.    Her legs are both mildly weak but balance is much more of an issue than strength.   She has most trouble with changes in surface or curbs.   . She denies much weakness in the arms. She denies any numbness in the arms or legs. She has clumsiness in arms and legs bilaterally, worse than the strength issues. Ampyra has helped her gait and she has been on x 2 years.       Bladder/bowel: She reports some difficulty with urinary urgency, frequency and nocturia (twice a night). Ditropan XL 10 mg has helped.   At most she has one x nocturia.     Vision: She feels her vision is back to baseline.  Fatigue/sleep: She has physical and mental fatigue.  Fatigue is better on Tysabri.   The Ampyra has helped her gait endurance as she is able to walk a little further with it.   Adderall helps but is expensive and she asked about Ritalin.     She is having more insomnia, both sleep onset and sleep maintenance.   Mood/Cognition:   She has had issues with depression and anxiety.   Her boyfriend died in a motorcycle accident last year.    She has done grief counseling.   She feels her cognitive fog is reduced since being on Tysabri and with stimulants.      MS history: She was diagnosed in September 2012 after presenting with diplopia. She was hospitalized and received 5 days of IV steroids and she had MRI and lumbar puncture. She recalls that the lumbar puncture was not consistent with MS but the MRI was.  She was placed on Gilenya in December 2012. She was able to return to work and the diplopia completely resolved. Then in July 2014 she had another exacerbation with visual disturbance bilaterally, gait changes and cognitive changes.  Reportedly, there was a new lesion in the brainstem and also in the spinal cord at that time. She received 5 more days of steroid.    In early May 2016, she had the onset of several symptoms including worse cognitive problems, worse gait problems and weakness in the hands.   She went to the [facility] on 06/03/2014 and she was evaluated. An admitted and received 3  days of IV Solu-Medrol.The MRI of the brain shows many white matter lesions. Some are periventricular with an appearance consistent with MS. She also has deep white matter subcortical and juxtacortical foci. Some of these look more consistent with small vessel ischemic changes and most more consistent with multiple sclerosis. She does have new foci in the left vermis of the cerebellum and in the left frontal periventricular region.[name]    REVIEW OF SYSTEMS: Constitutional: No fevers, chills, sweats, or change in appetite.   She has fatigue Eyes: No visual changes, double vision, eye pain Ear, nose and throat: No hearing loss, ear pain, nasal congestion, sore throat Cardiovascular: No chest pain, palpitations Respiratory: No shortness  of breath at rest or with exertion.   No wheezes GastrointestinaI: No nausea, vomiting, diarrhea, abdominal pain, fecal incontinence Genitourinary: No dysuria, urinary retention.   Frequency better on medications. Musculoskeletal: No neck pain, back pain Integumentary: No rash, pruritus, skin lesions Neurological: as above Psychiatric: No depression at this time.  No anxiety Endocrine: No palpitations, diaphoresis, change in appetite, change in weigh or increased thirst Hematologic/Lymphatic: No anemia, purpura, petechiae. Allergic/Immunologic: No itchy/runny eyes, nasal congestion, recent allergic reactions, rashes  ALLERGIES: Allergies  Allergen Reactions  . Oxycodone Anaphylaxis    HOME MEDICATIONS:  Current outpatient prescriptions:  .  acetaminophen (TYLENOL) 500 MG tablet, Take 500 mg by mouth every 6 (six) hours as needed for mild pain., Disp: , Rfl:  .  baclofen (LIORESAL) 10 MG tablet, Take 1 tablet (10 mg total) by mouth 3 (three) times daily as needed for muscle spasms., Disp: 90 each, Rfl: 3 .  carvedilol (COREG) 25 MG tablet, Take 25 mg by mouth 2 (two) times daily with a meal., Disp: , Rfl:  .  dalfampridine 10 MG TB12, Take 1 tablet (10 mg total) by mouth 2 (two) times daily., Disp: 60 tablet, Rfl: 11 .  FLUoxetine (PROZAC) 40 MG capsule, Take 1 capsule (40 mg total) by mouth daily., Disp: 30 capsule, Rfl: 3 .  hydrALAZINE (APRESOLINE) 50 MG tablet, Take 50 mg by mouth 2 (two) times daily., Disp: , Rfl:  .  ibuprofen (ADVIL,MOTRIN) 200 MG tablet, Take 200 mg by mouth every 6 (six) hours as needed for moderate pain., Disp: , Rfl:  .  lisinopril (PRINIVIL,ZESTRIL) 40 MG tablet, Take 40 mg by mouth daily., Disp: , Rfl:  .  natalizumab (TYSABRI) 300 MG/15ML injection, Inject into the vein., Disp: , Rfl:  .  ondansetron (ZOFRAN) 4 MG tablet, Take 4 mg by mouth every 8 (eight) hours as needed for nausea or vomiting., Disp: , Rfl:  .  oxybutynin (DITROPAN XL) 10 MG 24 hr  tablet, Take 1 tablet (10 mg total) by mouth at bedtime., Disp: 30 tablet, Rfl: 11 .  ranitidine (ZANTAC) 300 MG capsule, Take 300 mg by mouth every evening., Disp: , Rfl:  .  spironolactone (ALDACTONE) 50 MG tablet, Take 50 mg by mouth daily., Disp: , Rfl:  .  traZODone (DESYREL) 100 MG tablet, Take 1 tablet (100 mg total) by mouth at bedtime., Disp: 30 tablet, Rfl: 11 .  amphetamine-dextroamphetamine (ADDERALL) 10 MG tablet, Take 2 po q AM and 1 po 4 hours later (Patient not taking: Reported on 06/06/2015), Disp: 90 tablet, Rfl: 0 .  Dextromethorphan-Quinidine (NUEDEXTA) 20-10 MG CAPS, One pill po twice a day (Patient not taking: Reported on 06/06/2015), Disp: 60 capsule, Rfl: 11 .  doxazosin (CARDURA) 4 MG tablet, Take 4 mg by mouth daily. Reported on 06/06/2015, Disp: ,  Rfl:   PAST MEDICAL HISTORY: Past Medical History  Diagnosis Date  . Multiple sclerosis (HCC)   . Hypertension   . Hearing loss   . Vision abnormalities   . Kidney stones   . Complication of anesthesia     hard time waking up   . Sleep apnea     Uses CPAP    PAST SURGICAL HISTORY: Past Surgical History  Procedure Laterality Date  . Cesarean section  1986  . Cholecystectomy  2000    laparoscopic    FAMILY HISTORY: Family History  Problem Relation Age of Onset  . Stroke Other   . Fibromyalgia Mother   . Stroke Father   . Diabetes Father     SOCIAL HISTORY:  Social History   Social History  . Marital Status: Single    Spouse Name: N/A  . Number of Children: N/A  . Years of Education: N/A   Occupational History  . Not on file.   Social History Main Topics  . Smoking status: Never Smoker   . Smokeless tobacco: Never Used  . Alcohol Use: Yes     Comment: Occasional  . Drug Use: No  . Sexual Activity: Not on file   Other Topics Concern  . Not on file   Social History Narrative     PHYSICAL EXAM  Filed Vitals:   06/06/15 1137  BP: 126/86  Pulse: 68  Resp: 16  Height: 5\' 9"  (1.753 m)    Weight: 270 lb (122.471 kg)    Body mass index is 39.85 kg/(m^2).   General: The patient is well-developed and well-nourished and in no acute distress  Neurologic Exam  Mental status: The patient is alert and oriented x 3 at the time of the examination.  At times, she is tearful..  The patient has apparent normal recent and remote memory but decreased attention span and concentration ability.   Speech is normal.  Cranial nerves: Extraocular movements are full. Facial symmetry is present. There is good facial sensation to soft touch bilaterally.Facial strength is normal.  Trapezius and sternocleidomastoid strength is normal. No dysarthria is noted.  The tongue is midline, and the patient has symmetric elevation of the soft palate.  Motor:  Muscle bulk is normal.   Tone is normal. Strength is  5 / 5 in all 4 extremities.   Sensory: Sensory testing is intact to pinprick and vibration sensation in all 4 extremities.  Coordination: Cerebellar testing reveals good finger-nose-finger and reduced left heel-to-shin .  Gait and station: Station is normal.   Gait is wide. Tandem gait is poor.. Romberg is negative.   Reflexes: Deep tendon reflexes are symmetric and slightly increased in legsy.    25 foot timed walk (average of two) = 16.0 seconds    DIAGNOSTIC DATA (LABS, IMAGING, TESTING) - I reviewed patient records, labs, notes, testing and imaging myself where available.  Lab Results  Component Value Date   WBC 13.5* 11/16/2014   HGB 14.1 06/07/2014   HCT 44.1 11/16/2014   MCV 90 11/16/2014   PLT 263 11/16/2014      Component Value Date/Time   NA 144 02/12/2015 1139   NA 138 06/07/2014 0601   K 4.2 02/12/2015 1139   CL 105 02/12/2015 1139   CO2 20 02/12/2015 1139   GLUCOSE 97 02/12/2015 1139   GLUCOSE 133* 06/07/2014 0601   BUN 15 02/12/2015 1139   BUN 30* 06/07/2014 0601   CREATININE 1.13* 02/12/2015 1139   CALCIUM 9.5 02/12/2015  1139   PROT 6.1 11/16/2014 1047   PROT  6.6 06/04/2014 0011   ALBUMIN 4.5 11/16/2014 1047   ALBUMIN 3.8 06/04/2014 0011   AST 14 11/16/2014 1047   ALT 16 11/16/2014 1047   ALKPHOS 60 11/16/2014 1047   BILITOT 1.2 11/16/2014 1047   BILITOT 1.0 06/04/2014 0011   GFRNONAA 56* 02/12/2015 1139   GFRAA 64 02/12/2015 1139      ASSESSMENT AND PLAN  Multiple sclerosis (HCC)  Gait disturbance  Other fatigue  Obstructive sleep apnea on CPAP  Pseudobulbar affect  Urinary frequency  Depression with anxiety    1.   Continue Tysabri. She is aware that she is low positive for JCV antibody implying some risk of PML.   We will recheck her status again today and consider a change to ocrelizumab if she is positive 2.   Continue Ampyra. I will renew.   She will also continue the Adderall for fatigue. 3.   Continue oxybutynin.  4.    Phentermine 37.5 mg to help with weight loss and also as a mild stimulant this may help her fatigue and cognitive fog, similar to Adderall.  She will return in 4 months for f/u with me or call sooner if problems   Aldrin Engelhard A. Epimenio Foot, MD, PhD 06/06/2015, 11:40 AM Certified in Neurology, Clinical Neurophysiology, Sleep Medicine, Pain Medicine and Neuroimaging  [facility] [address], [address] [place], [place] [address] [phone]

## 2015-06-07 ENCOUNTER — Encounter: Payer: Self-pay | Admitting: *Deleted

## 2015-06-07 ENCOUNTER — Telehealth: Payer: Self-pay | Admitting: Neurology

## 2015-06-07 LAB — CBC WITH DIFFERENTIAL/PLATELET
BASOS: 1 %
Basophils Absolute: 0.1 10*3/uL (ref 0.0–0.2)
EOS (ABSOLUTE): 0.2 10*3/uL (ref 0.0–0.4)
EOS: 2 %
HEMATOCRIT: 42.2 % (ref 34.0–46.6)
Hemoglobin: 14.1 g/dL (ref 11.1–15.9)
Immature Grans (Abs): 0.2 10*3/uL — ABNORMAL HIGH (ref 0.0–0.1)
Immature Granulocytes: 2 %
LYMPHS ABS: 2.7 10*3/uL (ref 0.7–3.1)
Lymphs: 23 %
MCH: 29.3 pg (ref 26.6–33.0)
MCHC: 33.4 g/dL (ref 31.5–35.7)
MCV: 88 fL (ref 79–97)
MONOS ABS: 0.7 10*3/uL (ref 0.1–0.9)
Monocytes: 6 %
NEUTROS ABS: 7.9 10*3/uL — AB (ref 1.4–7.0)
Neutrophils: 66 %
Platelets: 259 10*3/uL (ref 150–379)
RBC: 4.81 x10E6/uL (ref 3.77–5.28)
RDW: 15.8 % — AB (ref 12.3–15.4)
WBC: 11.8 10*3/uL — ABNORMAL HIGH (ref 3.4–10.8)

## 2015-06-07 NOTE — Telephone Encounter (Signed)
Patient is calling. She states she is supposed to have a Tysabri infusion tomorrow but was told it has not been pre authorized. Should the infusion be cancelled or can you get pre authorization?  She says she was told that the Tysabri infusions she had already on  2-16, 3-17 and 4-14 of this year  were not pre authorized either and she cannot afford to pay for them. She is upset. Please call and discuss.

## 2015-06-07 NOTE — Telephone Encounter (Signed)
PA for Tysabri completed and faxed to [facility], fax # [phone].  Pending ref. # [id number].  For past infusions that did not have a PA--pt. must receive a denial first, then call [phone], option 1 for claims.Rachel Gilbert

## 2015-06-07 NOTE — Telephone Encounter (Signed)
I have spoken with Revonda Standard at [facility] and advised ok for Phentermine tabs instead of caps/fim

## 2015-06-07 NOTE — Telephone Encounter (Signed)
[employer] [phone] sts pt is in the store now. Sts  phentermine 37.5 MG capsule , it will be cheaper for tablets. Please call

## 2015-06-08 ENCOUNTER — Encounter (HOSPITAL_COMMUNITY)
Admission: RE | Admit: 2015-06-08 | Discharge: 2015-06-08 | Disposition: A | Discharge status: Home / Self Care | Payer: Medicare HMO | Source: Ambulatory Visit | Attending: Neurology | Admitting: Neurology

## 2015-06-08 DIAGNOSIS — G35 Multiple sclerosis: Secondary | ICD-10-CM | POA: Insufficient documentation

## 2015-06-11 NOTE — Telephone Encounter (Signed)
I have spoken with Rachel Gilbert this afternoon and advised that I have not received approval for Tysabri infusions yet--should be soon tho, and as soon as I do, I'll let [name] know so that she can r/s her infuison.  She verbalized understanding of same/fim

## 2015-06-11 NOTE — Telephone Encounter (Signed)
Patient is calling and states she got a call from [place] and her Rachel Gilbert has been approved for a year.  She is calling to see if you have received the approval and to r/s her infusion.  Please call.

## 2015-06-12 ENCOUNTER — Encounter: Payer: Self-pay | Admitting: *Deleted

## 2015-06-12 ENCOUNTER — Ambulatory Visit: Payer: Medicare HMO | Admitting: Neurology

## 2015-06-14 ENCOUNTER — Telehealth: Payer: Self-pay | Admitting: *Deleted

## 2015-06-14 NOTE — Telephone Encounter (Signed)
I have spoken with Rachel Gilbert this am--advised approval for Tysabri infusions was received this am and I have faxed a copy of approval to [employer]. She may call and r/s Tysabri infusion/fim

## 2015-06-14 NOTE — Telephone Encounter (Signed)
Approval for Tysabri infusions at [facility] received. Authorization valid from 06-07-15 thru 06-06-16.  Member ID # [id number].  Ref. # [id number].  I have spoken with Bonita Quin at [facility] short stay and let her know approval was received.  Copy of approval faxed to [employer] [facility] fax # [phone].  I will call pt. and let her know approval has been received so she can call and r/s infusion/fim

## 2015-06-14 NOTE — Telephone Encounter (Signed)
------------------------------------------------------------   Caller [facility] [id number]  Patient [facility]         Pt's Dr Epimenio Foot        Area Code 866 Phone# [phone] * DOB [phone]    RE CB ABOUT APPROVAL ON AUTHORIZATION                YME:[phone]                                     Disp:Y/N N If Y = C/B If No Response In ============================================================

## 2015-06-14 NOTE — Telephone Encounter (Signed)
Pt inquiring if PA had been rec'd. Please call

## 2015-06-29 ENCOUNTER — Encounter (HOSPITAL_COMMUNITY): Payer: Self-pay

## 2015-06-29 ENCOUNTER — Encounter (HOSPITAL_COMMUNITY)
Admission: RE | Admit: 2015-06-29 | Discharge: 2015-06-29 | Disposition: A | Discharge status: Home / Self Care | Payer: Medicare HMO | Source: Ambulatory Visit | Attending: Neurology | Admitting: Neurology

## 2015-06-29 VITALS — BP 161/90 | HR 64 | Temp 97.9°F | Resp 16 | Ht 69.0 in | Wt 262.6 lb

## 2015-06-29 DIAGNOSIS — G35 Multiple sclerosis: Secondary | ICD-10-CM | POA: Insufficient documentation

## 2015-06-29 MED ORDER — SODIUM CHLORIDE 0.9 % IV SOLN
INTRAVENOUS | Status: DC
  Administered 2015-06-29: 11:00:00 via INTRAVENOUS

## 2015-06-29 MED ORDER — ACETAMINOPHEN 500 MG PO TABS: 1000.0000 mg | ORAL_TABLET | Freq: Once | ORAL | Status: DC

## 2015-06-29 MED ORDER — SODIUM CHLORIDE 0.9 % IV SOLN
300.0000 mg | INTRAVENOUS | Status: DC
  Administered 2015-06-29: 300 mg via INTRAVENOUS
  Filled 2015-06-29: qty 15

## 2015-06-29 MED ORDER — LORATADINE 10 MG PO TABS: 10.0000 mg | ORAL_TABLET | Freq: Once | ORAL | Status: DC

## 2015-06-29 NOTE — Discharge Instructions (Signed)

## 2015-06-29 NOTE — Progress Notes (Signed)
Uneventful Tysabri infusion.  Pt stayed . Post infusion.  Pt d/c ambulatory with cane and friend to lobby.  Pt informed to call MD for questions/concerns.

## 2015-07-30 ENCOUNTER — Encounter (HOSPITAL_COMMUNITY): Payer: Self-pay

## 2015-07-30 ENCOUNTER — Encounter (HOSPITAL_COMMUNITY)
Admission: RE | Admit: 2015-07-30 | Discharge: 2015-07-30 | Disposition: A | Discharge status: Home / Self Care | Payer: Medicare HMO | Source: Ambulatory Visit | Attending: Neurology | Admitting: Neurology

## 2015-07-30 VITALS — BP 146/89 | HR 68 | Temp 97.9°F | Resp 16 | Wt 262.8 lb

## 2015-07-30 DIAGNOSIS — G35 Multiple sclerosis: Secondary | ICD-10-CM | POA: Diagnosis not present

## 2015-07-30 MED ORDER — SODIUM CHLORIDE 0.9 % IV SOLN
INTRAVENOUS | Status: DC
  Administered 2015-07-30: 11:00:00 via INTRAVENOUS

## 2015-07-30 MED ORDER — SODIUM CHLORIDE 0.9 % IV SOLN
300.0000 mg | INTRAVENOUS | Status: DC
  Administered 2015-07-30: 300 mg via INTRAVENOUS
  Filled 2015-07-30: qty 15

## 2015-08-28 ENCOUNTER — Other Ambulatory Visit (HOSPITAL_COMMUNITY): Payer: Self-pay | Admitting: Neurology

## 2015-08-28 ENCOUNTER — Encounter (HOSPITAL_COMMUNITY): Payer: Self-pay

## 2015-08-28 ENCOUNTER — Encounter (HOSPITAL_COMMUNITY)
Admission: RE | Admit: 2015-08-28 | Discharge: 2015-08-28 | Disposition: A | Discharge status: Home / Self Care | Payer: Medicare HMO | Source: Ambulatory Visit | Attending: Neurology | Admitting: Neurology

## 2015-08-28 DIAGNOSIS — G35 Multiple sclerosis: Secondary | ICD-10-CM | POA: Diagnosis not present

## 2015-08-28 MED ORDER — SODIUM CHLORIDE 0.9 % IV SOLN
INTRAVENOUS | Status: DC
  Administered 2015-08-28: 11:00:00 via INTRAVENOUS

## 2015-08-28 MED ORDER — SODIUM CHLORIDE 0.9 % IV SOLN
300.0000 mg | INTRAVENOUS | Status: DC
  Administered 2015-08-28: 300 mg via INTRAVENOUS
  Filled 2015-08-28: qty 15

## 2015-08-28 NOTE — Progress Notes (Signed)
Uneventful tysabri infusion.  Pt stayed 25 min. Post infusion.  Pt knows to call Md with questions/concerns.

## 2015-08-28 NOTE — Discharge Instructions (Signed)

## 2015-09-20 ENCOUNTER — Telehealth: Payer: Self-pay | Admitting: Neurology

## 2015-09-20 NOTE — Telephone Encounter (Signed)
Noted/fim 

## 2015-09-20 NOTE — Telephone Encounter (Signed)
Patient called to advise [name] that she will be stopping by our office on Tuesday August 29th on her way to [facility] for infusion, to drop off Rachel Gilbert Forms for Dr. Epimenio Foot to fill out. Needs to have forms back by September 8th. Patient was advised of 14 day turn around for forms and if forms are completed, there is a $50 fee. Patient understands and agrees.

## 2015-09-25 ENCOUNTER — Encounter (HOSPITAL_COMMUNITY)
Admission: RE | Admit: 2015-09-25 | Discharge: 2015-09-25 | Disposition: A | Discharge status: Home / Self Care | Payer: Medicare HMO | Source: Ambulatory Visit | Attending: Neurology | Admitting: Neurology

## 2015-09-25 DIAGNOSIS — G35 Multiple sclerosis: Secondary | ICD-10-CM | POA: Diagnosis not present

## 2015-09-25 MED ORDER — EPINEPHRINE 0.3 MG/0.3ML IJ SOAJ
0.3000 mg | Freq: Once | INTRAMUSCULAR | Status: DC
  Filled 2015-09-25: qty 0.3

## 2015-09-25 MED ORDER — SODIUM CHLORIDE 0.9 % IV SOLN
INTRAVENOUS | Status: DC
  Administered 2015-09-25: 11:00:00 via INTRAVENOUS

## 2015-09-25 MED ORDER — SODIUM CHLORIDE 0.9 % IV SOLN
500.0000 mg | Freq: Once | INTRAVENOUS | Status: DC
  Filled 2015-09-25: qty 4

## 2015-09-25 MED ORDER — SODIUM CHLORIDE 0.9 % IV SOLN
300.0000 mg | INTRAVENOUS | Status: DC
  Administered 2015-09-25: 300 mg via INTRAVENOUS
  Filled 2015-09-25: qty 15

## 2015-09-25 NOTE — Progress Notes (Signed)
Uneventful infusion of Tysabri. Pt stayed 30 minutes and  declines to stay the [facility] suggested 1 hour post infusion observation. She will contact her neurologist for any questions of concerns

## 2015-09-27 DIAGNOSIS — Z0289 Encounter for other administrative examinations: Secondary | ICD-10-CM

## 2015-10-16 ENCOUNTER — Telehealth: Payer: Self-pay | Admitting: Neurology

## 2015-10-16 NOTE — Telephone Encounter (Signed)
Pt has new insurance and needs a new order for cpap supplies, she just wanted to give nurse a heads up and states she will also need to be fitted for a new mask , which will need to be stated on the order. [phone]

## 2015-10-17 NOTE — Telephone Encounter (Signed)
I have spoken with Rachel Gilbert this afternoon and advised that I received orders from [facility], RAS has signed them and I have faxed these back.  I added that she needs a new face mask fitting/fim

## 2015-10-17 NOTE — Telephone Encounter (Signed)
LMTC.  I need to know if she would like for me to fax order for CPAP supplies and mask fitting somewhere, or if she would like to pick the order up in our office.  What DME company is she using for supplies?/fim

## 2015-10-17 NOTE — Telephone Encounter (Signed)
LMTC./fim 

## 2015-10-17 NOTE — Telephone Encounter (Signed)
Pt returned call

## 2015-10-19 ENCOUNTER — Telehealth: Payer: Self-pay | Admitting: Neurology

## 2015-10-19 DIAGNOSIS — G4733 Obstructive sleep apnea (adult) (pediatric): Secondary | ICD-10-CM

## 2015-10-19 DIAGNOSIS — Z9989 Dependence on other enabling machines and devices: Principal | ICD-10-CM

## 2015-10-19 NOTE — Telephone Encounter (Signed)
Patient called to request order for new CPAP machine faxed to [facility], states she's had her machine for 8 years and machine is acting up.

## 2015-10-22 NOTE — Telephone Encounter (Signed)
Order for new CPAP machine faxed to [facility]/fim

## 2015-10-24 ENCOUNTER — Encounter (HOSPITAL_COMMUNITY): Payer: Self-pay

## 2015-10-24 ENCOUNTER — Encounter (HOSPITAL_COMMUNITY)
Admission: RE | Admit: 2015-10-24 | Discharge: 2015-10-24 | Disposition: A | Discharge status: Home / Self Care | Payer: Medicare HMO | Source: Ambulatory Visit | Attending: Neurology | Admitting: Neurology

## 2015-10-24 DIAGNOSIS — G35 Multiple sclerosis: Secondary | ICD-10-CM | POA: Insufficient documentation

## 2015-10-24 MED ORDER — SODIUM CHLORIDE 0.9 % IV SOLN
300.0000 mg | INTRAVENOUS | Status: DC
  Administered 2015-10-24: 300 mg via INTRAVENOUS
  Filled 2015-10-24: qty 15

## 2015-10-24 MED ORDER — SODIUM CHLORIDE 0.9 % IV SOLN
INTRAVENOUS | Status: DC
  Administered 2015-10-24: 10:00:00 via INTRAVENOUS

## 2015-10-24 NOTE — Discharge Instructions (Signed)

## 2015-10-26 NOTE — Telephone Encounter (Signed)
[facility] [phone] ext [phone], called to request Rx for pressure settings and current office visit note, usage and benefits of Pap therapy.

## 2015-10-26 NOTE — Telephone Encounter (Signed)
LMOM (identified vm) for Boneta Lucks that I have been communicating with someone else at [facility] I think, and have already staff messaged her that we do not have notes for [name]--she has an upcoming Oct. appt.--I will fax notes from that appt./fim

## 2015-10-31 DIAGNOSIS — R7302 Impaired glucose tolerance (oral): Secondary | ICD-10-CM | POA: Insufficient documentation

## 2015-11-01 ENCOUNTER — Encounter: Payer: Self-pay | Admitting: Neurology

## 2015-11-01 ENCOUNTER — Ambulatory Visit (INDEPENDENT_AMBULATORY_CARE_PROVIDER_SITE_OTHER): Payer: Medicare HMO | Admitting: Neurology

## 2015-11-01 VITALS — BP 122/74 | HR 78 | Resp 20 | Ht 69.0 in | Wt 271.0 lb

## 2015-11-01 DIAGNOSIS — Z9989 Dependence on other enabling machines and devices: Secondary | ICD-10-CM

## 2015-11-01 DIAGNOSIS — R350 Frequency of micturition: Secondary | ICD-10-CM

## 2015-11-01 DIAGNOSIS — G4700 Insomnia, unspecified: Secondary | ICD-10-CM

## 2015-11-01 DIAGNOSIS — R5383 Other fatigue: Secondary | ICD-10-CM | POA: Diagnosis not present

## 2015-11-01 DIAGNOSIS — F482 Pseudobulbar affect: Secondary | ICD-10-CM

## 2015-11-01 DIAGNOSIS — G35 Multiple sclerosis: Secondary | ICD-10-CM

## 2015-11-01 DIAGNOSIS — G4733 Obstructive sleep apnea (adult) (pediatric): Secondary | ICD-10-CM | POA: Diagnosis not present

## 2015-11-01 DIAGNOSIS — R269 Unspecified abnormalities of gait and mobility: Secondary | ICD-10-CM

## 2015-11-01 MED ORDER — PHENTERMINE HCL 37.5 MG PO CAPS: 37.5000 mg | ORAL_CAPSULE | ORAL | 5 refills | Status: DC

## 2015-11-01 NOTE — Telephone Encounter (Signed)
I have faxed notes from [name]'s 11-01-15 ov to [facility]/fim

## 2015-11-01 NOTE — Progress Notes (Signed)
[facility]  PATIENT: Rachel Gilbert DOB: 07/26/1960  REFERRING DOCTOR OR PCP:  ER  No PCP SOURCE: ER notes, images on PACS, patient  _________________________________   HISTORICAL  CHIEF COMPLAINT:  Chief Complaint  Patient presents with  . Multiple Sclerosis    Sts. she continues to tolerate Tysabri well.  JCV ab last checked 06-06-15 and was negative at 0.17.  She is here today to transfer care of OSA to Dr. Epimenio FootSater.  Sts she was dx. in 2009.  Sleep study was done at [facility].  She brought a copy of that sleep study with her today. Sts. she has been on CPAP since then, pressure of 8.  Sts. she tolerates this well, Sts. she sleeps well with it, does not snore when she's wearing it, does not choke herself awake.  Feels more awake the next day.  Sts. she  . Sleep Apnea    uses CPAP every night for around 10 hours per night.  Her current machine is 8 yrs. old and she would like to get a new one/fim    HISTORY OF PRESENT ILLNESS:  Rachel Gilbert is a 55 yo woman with MS and OSA.  She switched to Tysabri 05/2014.     She is low positive JCV AB positive.   We discussed the risks and benefits of Tysabri (1:2000 risk of PML assuming she remains low positive or negative.   We will recheck the titer today.      OSA:   She has severe OSA. The sleep study 6 2009 showed an AHI equals 38. Her oxygen desaturations to 76%. She was titrated to CPAP +8 cm with good results she continues to use CPAP +8 cm nightly.  She brought in her machine [employer](ResMed Elite 2).    Unfortunately, the card was no longer readable so I could not get short-term or long-term data.   She notes that her mask sometimes leaks. She does not think that she snores to the mask.  Gait/strength/sensation/coordination:   Balance is poor affecting gait.   She often uses a cane for balance.    Her legs are both mildly weak.   She has most trouble with changes in surface or with curbs.   . She denies weakness in the  arms. She denies any numbness in the arms or legs. She has mild leg clumsiness. Ampyra has helped her gait and she has been on x 2 years.       Bladder/bowel: She has urinary urgency, frequency and nocturia (twice a night). Ditropan XL 10 mg has helped.   At most she has one x nocturia.     Vision: She feels her vision is back to baseline.  Fatigue/sleep: She has physical and mental fatigue.  Fatigue is better on Tysabri than previous treatments.   The Ampyra has helped her gait endurance as she is able to walk a little further with it.  Phentermine helps as much as Adderall but makes her feel hot at times.    She has insomnia, both sleep onset and sleep maintenance.   Mood/Cognition:   She has had issues with depression and anxiety.   Her boyfriend died in a motorcycle accident last year.    She has done grief counseling.   She feels her cognitive fog is reduced since being on Tysabri and with stimulants.       MS history: She was diagnosed in September 2012 after presenting with diplopia. She was hospitalized and received 5  days of IV steroids and she had MRI and lumbar puncture. She recalls that the lumbar puncture was not consistent with MS but the MRI was.  She was placed on Gilenya in December 2012. She was able to return to work and the diplopia completely resolved. Then in July 2014 she had another exacerbation with visual disturbance bilaterally, gait changes and cognitive changes.  Reportedly, there was a new lesion in the brainstem and also in the spinal cord at that time. She received 5 more days of steroid.    In early May 2016, she had the onset of several symptoms including worse cognitive problems, worse gait problems and weakness in the hands.   She went to the [facility] on 06/03/2014 and she was evaluated. An admitted and received 3 days of IV Solu-Medrol.The MRI of the brain shows many white matter lesions. Some are periventricular with an appearance consistent  with MS. She also has deep white matter subcortical and juxtacortical foci. Some of these look more consistent with small vessel ischemic changes and most more consistent with multiple sclerosis. She does have new foci in the left vermis of the cerebellum and in the left frontal periventricular region.[name]    REVIEW OF SYSTEMS: Constitutional: No fevers, chills, sweats, or change in appetite.   She has fatigue Eyes: No visual changes, double vision, eye pain Ear, nose and throat: No hearing loss, ear pain, nasal congestion, sore throat Cardiovascular: No chest pain, palpitations Respiratory: No shortness of breath at rest or with exertion.   No wheezes GastrointestinaI: No nausea, vomiting, diarrhea, abdominal pain, fecal incontinence Genitourinary: No dysuria, urinary retention.   Frequency better on medications. Musculoskeletal: No neck pain, back pain Integumentary: No rash, pruritus, skin lesions Neurological: as above Psychiatric: No depression at this time.  No anxiety Endocrine: No palpitations, diaphoresis, change in appetite, change in weigh or increased thirst Hematologic/Lymphatic: No anemia, purpura, petechiae. Allergic/Immunologic: No itchy/runny eyes, nasal congestion, recent allergic reactions, rashes  ALLERGIES: Allergies  Allergen Reactions  . Oxycodone Anaphylaxis    HOME MEDICATIONS:  Current Outpatient Prescriptions:  .  acetaminophen (TYLENOL) 500 MG tablet, Take 500 mg by mouth every 6 (six) hours as needed for mild pain., Disp: , Rfl:  .  ALPRAZolam (XANAX) 0.5 MG tablet, Take 0.5 mg by mouth 2 (two) times daily as needed for anxiety., Disp: , Rfl:  .  baclofen (LIORESAL) 10 MG tablet, Take 1 tablet (10 mg total) by mouth 3 (three) times daily as needed for muscle spasms., Disp: 90 each, Rfl: 3 .  carvedilol (COREG) 25 MG tablet, Take 25 mg by mouth 2 (two) times daily with a meal., Disp: , Rfl:  .  cholecalciferol (VITAMIN D) 1000 units tablet, Take 5,000  Units by mouth daily., Disp: , Rfl:  .  dalfampridine 10 MG TB12, Take 1 tablet (10 mg total) by mouth 2 (two) times daily., Disp: 60 tablet, Rfl: 11 .  Dextromethorphan-Quinidine (NUEDEXTA) 20-10 MG CAPS, One pill po twice a day, Disp: 60 capsule, Rfl: 11 .  doxazosin (CARDURA) 4 MG tablet, Take 4 mg by mouth daily. Reported on 06/06/2015, Disp: , Rfl:  .  FLUoxetine (PROZAC) 40 MG capsule, Take 1 capsule (40 mg total) by mouth daily., Disp: 30 capsule, Rfl: 3 .  hydrALAZINE (APRESOLINE) 50 MG tablet, Take 50 mg by mouth 2 (two) times daily., Disp: , Rfl:  .  ibuprofen (ADVIL,MOTRIN) 200 MG tablet, Take 200 mg by mouth every 6 (six) hours as needed for  moderate pain., Disp: , Rfl:  .  lisinopril (PRINIVIL,ZESTRIL) 40 MG tablet, Take 40 mg by mouth daily., Disp: , Rfl:  .  natalizumab (TYSABRI) 300 MG/15ML injection, Inject into the vein., Disp: , Rfl:  .  ondansetron (ZOFRAN) 4 MG tablet, Take 4 mg by mouth every 8 (eight) hours as needed for nausea or vomiting., Disp: , Rfl:  .  oxybutynin (DITROPAN XL) 10 MG 24 hr tablet, Take 1 tablet (10 mg total) by mouth at bedtime., Disp: 30 tablet, Rfl: 11 .  phentermine 37.5 MG capsule, Take 1 capsule (37.5 mg total) by mouth every morning., Disp: 30 capsule, Rfl: 5 .  ranitidine (ZANTAC) 300 MG capsule, Take 300 mg by mouth every evening., Disp: , Rfl:  .  spironolactone (ALDACTONE) 50 MG tablet, Take 50 mg by mouth daily., Disp: , Rfl:  .  traZODone (DESYREL) 100 MG tablet, Take 1 tablet (100 mg total) by mouth at bedtime., Disp: 30 tablet, Rfl: 11  PAST MEDICAL HISTORY: Past Medical History:  Diagnosis Date  . Complication of anesthesia    hard time waking up   . Hearing loss   . Hypertension   . Kidney stones   . Multiple sclerosis (HCC)   . Sleep apnea    Uses CPAP  . Vision abnormalities     PAST SURGICAL HISTORY: Past Surgical History:  Procedure Laterality Date  . CESAREAN SECTION  1986  . CHOLECYSTECTOMY  2000   laparoscopic     FAMILY HISTORY: Family History  Problem Relation Age of Onset  . Fibromyalgia Mother   . Stroke Father   . Diabetes Father   . Stroke Other     SOCIAL HISTORY:  Social History   Social History  . Marital status: Single    Spouse name: N/A  . Number of children: N/A  . Years of education: N/A   Occupational History  . Not on file.   Social History Main Topics  . Smoking status: Never Smoker  . Smokeless tobacco: Never Used  . Alcohol use Yes     Comment: Occasional  . Drug use: No  . Sexual activity: Not on file   Other Topics Concern  . Not on file   Social History Narrative  . No narrative on file     PHYSICAL EXAM  Vitals:   11/01/15 1420  BP: 122/74  Pulse: 78  Resp: 20  Weight: 271 lb (122.9 kg)  Height: 5\' 9"  (1.753 m)    Body mass index is 40.02 kg/m.   General: The patient is well-developed and well-nourished and in no acute distress  Neurologic Exam  Mental status: The patient is alert and oriented x 3 at the time of the examination.  At times, she is tearful..  The patient has apparent normal recent and remote memory but decreased attention span and concentration ability.   Speech is normal.  Cranial nerves: Extraocular movements are full. Facial symmetry is present. There is good facial sensation to soft touch bilaterally.Facial strength is normal.  Trapezius and sternocleidomastoid strength is normal. No dysarthria is noted.  The tongue is midline, and the patient has symmetric elevation of the soft palate.  Motor:  Muscle bulk is normal.   Tone is normal. Strength is  5 / 5 in all 4 extremities.   Sensory: Sensory testing is intact to pinprick and vibration sensation in all 4 extremities.  Coordination: Cerebellar testing reveals good finger-nose-finger and reduced left heel-to-shin .  Gait and station: Station is  normal.   Gait is wide. Tandem gait is poor.. Romberg is borderline.   Reflexes: Deep tendon reflexes are symmetric and  slightly increased in legs.       DIAGNOSTIC DATA (LABS, IMAGING, TESTING) - I reviewed patient records, labs, notes, testing and imaging myself where available.  Lab Results  Component Value Date   WBC 11.8 (H) 06/06/2015   HGB 14.1 06/07/2014   HCT 42.2 06/06/2015   MCV 88 06/06/2015   PLT 259 06/06/2015      Component Value Date/Time   NA 144 02/12/2015 1139   K 4.2 02/12/2015 1139   CL 105 02/12/2015 1139   CO2 20 02/12/2015 1139   GLUCOSE 97 02/12/2015 1139   GLUCOSE 133 (H) 06/07/2014 0601   BUN 15 02/12/2015 1139   CREATININE 1.13 (H) 02/12/2015 1139   CALCIUM 9.5 02/12/2015 1139   PROT 6.1 11/16/2014 1047   ALBUMIN 4.5 11/16/2014 1047   AST 14 11/16/2014 1047   ALT 16 11/16/2014 1047   ALKPHOS 60 11/16/2014 1047   BILITOT 1.2 11/16/2014 1047   GFRNONAA 56 (L) 02/12/2015 1139   GFRAA 64 02/12/2015 1139      ASSESSMENT AND PLAN  Multiple sclerosis (HCC)  Obstructive sleep apnea on CPAP  Gait disturbance  Other fatigue  Urinary frequency  Pseudobulbar affect  Insomnia, unspecified type   1.   Continue Tysabri. She has low positive JCV antibody implying some risk of PML.   We will recheck her status again today.  If she converts to high positive,  consider a change to ocrelizumab  2.   Continue Ampyra. I will renew.   3.   Her current CPAP machine is 54 years old. We need to get a new machine for her and new supplies. Her setting is CPAP +8 cm.    He uses a full facemask. Currently she is using the [facility].  4.    Continue Phentermine 37.5 mg to help with weight loss,  fatigue and cognitive fog.   She will return in 4 months for f/u with me or call sooner if problems   Leanora Murin A. Epimenio Foot, MD, PhD 11/01/2015, 2:53 PM Certified in Neurology, Clinical Neurophysiology, Sleep Medicine, Pain Medicine and Neuroimaging  [facility] [address], [address] [place], [place] [address] [phone]

## 2015-11-02 ENCOUNTER — Telehealth: Payer: Self-pay | Admitting: *Deleted

## 2015-11-02 LAB — CBC WITH DIFFERENTIAL/PLATELET
BASOS ABS: 0.1 10*3/uL (ref 0.0–0.2)
Basos: 0 %
EOS (ABSOLUTE): 0.3 10*3/uL (ref 0.0–0.4)
Eos: 2 %
HEMOGLOBIN: 14.7 g/dL (ref 11.1–15.9)
Hematocrit: 43.4 % (ref 34.0–46.6)
IMMATURE GRANS (ABS): 0.2 10*3/uL — AB (ref 0.0–0.1)
IMMATURE GRANULOCYTES: 1 %
LYMPHS: 21 %
Lymphocytes Absolute: 3.1 10*3/uL (ref 0.7–3.1)
MCH: 29.6 pg (ref 26.6–33.0)
MCHC: 33.9 g/dL (ref 31.5–35.7)
MCV: 88 fL (ref 79–97)
MONOCYTES: 6 %
Monocytes Absolute: 0.9 10*3/uL (ref 0.1–0.9)
Neutrophils Absolute: 10.3 10*3/uL — ABNORMAL HIGH (ref 1.4–7.0)
Neutrophils: 70 %
Platelets: 331 10*3/uL (ref 150–379)
RBC: 4.96 x10E6/uL (ref 3.77–5.28)
RDW: 15.5 % — ABNORMAL HIGH (ref 12.3–15.4)
WBC: 14.8 10*3/uL — AB (ref 3.4–10.8)

## 2015-11-02 MED ORDER — SULFAMETHOXAZOLE-TRIMETHOPRIM 800-160 MG PO TABS: 1.0000 | ORAL_TABLET | Freq: Two times a day (BID) | ORAL | 0 refills | Status: DC

## 2015-11-02 NOTE — Telephone Encounter (Signed)
-----   Message from Asa Lenteichard A Sater, MD sent at 11/02/2015  8:26 AM EDT ----- Her white blood cell count was high. Does she have any UTI symptoms? If so, please call in Bactrim DS one by mouth twice a day 7 days.

## 2015-11-02 NOTE — Telephone Encounter (Signed)
I have spoken with Rachel Gilbert this morning, and per RAS, advised that [facility] are a little high at 14.8.  She denies uti sx.  Sts. she has urinary frequency, but this is normal for her.  As it is Friday, Bactrim DS escribed to [facility], so that if she presents with sx. over the weekend, she will have med./fim

## 2015-11-05 NOTE — Telephone Encounter (Signed)
Pt called to advise she had labs at PCP and it also showed elevation in WBC. She said PCP had advised it should be checked again in 2wks. She is wanting to know if Dr Kathie Rhodes will order it, that is a 30 min drive or if he wants PCP to order it, which is 1-1/2 hr drive. Please call

## 2015-11-06 ENCOUNTER — Other Ambulatory Visit: Payer: Self-pay

## 2015-11-06 ENCOUNTER — Telehealth: Payer: Self-pay

## 2015-11-06 DIAGNOSIS — G35 Multiple sclerosis: Secondary | ICD-10-CM

## 2015-11-06 DIAGNOSIS — G4733 Obstructive sleep apnea (adult) (pediatric): Secondary | ICD-10-CM

## 2015-11-06 NOTE — Telephone Encounter (Signed)
We can check again at her next tysabri infusion

## 2015-11-06 NOTE — Telephone Encounter (Signed)
I spoke to pt and advised her that Dr. Epimenio Foot said we can check her WBC at her next tysabri infusion. Pt says that she does not get her tysabri infusion here at [facility]; she gets it at [facility]. She says that her next infusion is not until November and is again asking if she can come by [facility] in 2 weeks to recheck her WBC.  I spoke to Dr. Epimenio Foot again and he agreed that pt can come here to have her WBC rechecked in 2 weeks.  I spoke to pt and advised her of this. Pt will come by in the next 2 weeks to have her blood drawn. I advised pt of clinic hours.

## 2015-11-06 NOTE — Telephone Encounter (Signed)
Dr. Epimenio Foot, are you agreeable to ordering CBC diff to recheck pt's WBC?

## 2015-11-06 NOTE — Telephone Encounter (Signed)
Received a fax from [facility] saying that in order to provide pt a new cpap machine, the order for cpap needs to say that she is on 8 cm H2O. I spoke to Dr. Epimenio FootSater and he gave me a verbal order to place this order. [facility].  I spoke to pt and advised her of this. Pt verbalized understanding.

## 2015-11-08 ENCOUNTER — Encounter: Payer: Self-pay | Admitting: *Deleted

## 2015-11-21 ENCOUNTER — Encounter (HOSPITAL_COMMUNITY): Payer: Medicare HMO

## 2015-11-27 ENCOUNTER — Encounter: Payer: Self-pay | Admitting: Neurology

## 2015-11-28 ENCOUNTER — Encounter: Payer: Self-pay | Admitting: *Deleted

## 2015-11-28 ENCOUNTER — Telehealth: Payer: Self-pay | Admitting: Neurology

## 2015-11-28 NOTE — Telephone Encounter (Signed)
I have spoken with Rachel Gilbert.  RAS has well documented her difficulty with gait/balance.  Letter mailed to her home address/fim

## 2015-11-28 NOTE — Telephone Encounter (Signed)
Pt returned call states she needs it to say one floor apt. Current apt is 2 floors. She is wanting to break her lease , stating it renews in Feb. But she is afraid to wait. While talking with pt the call was dropped, lost signal.

## 2015-11-28 NOTE — Telephone Encounter (Signed)
Pt called to advise she fell going up the stairs inside her apartment on Sunday night while carrying towels. She said her legs gave away. She has talked with the landlord and was advised she would need a letter from the provider that states her condition and what a danger it is for her to continue going up and down the stairs.  She said with this letter she will be able to break her lease.

## 2015-11-28 NOTE — Telephone Encounter (Signed)
[facility]  Does she just need a letter stating she must have a ground floor apartment?

## 2015-12-05 ENCOUNTER — Encounter (HOSPITAL_COMMUNITY)
Admission: RE | Admit: 2015-12-05 | Discharge: 2015-12-05 | Disposition: A | Discharge status: Home / Self Care | Payer: Medicare HMO | Source: Ambulatory Visit | Attending: Neurology | Admitting: Neurology

## 2015-12-05 ENCOUNTER — Encounter (HOSPITAL_COMMUNITY): Payer: Self-pay

## 2015-12-05 DIAGNOSIS — G35 Multiple sclerosis: Secondary | ICD-10-CM | POA: Diagnosis present

## 2015-12-05 MED ORDER — SODIUM CHLORIDE 0.9 % IV SOLN
300.0000 mg | INTRAVENOUS | Status: DC
  Administered 2015-12-05: 300 mg via INTRAVENOUS
  Filled 2015-12-05: qty 15

## 2015-12-05 MED ORDER — SODIUM CHLORIDE 0.9 % IV SOLN
INTRAVENOUS | Status: DC
  Administered 2015-12-05: 10:00:00 via INTRAVENOUS

## 2015-12-05 NOTE — Progress Notes (Signed)
Post-infusion of Tysabri, pt. Stayed 20 minutes, pt. To follow-up with doctor with any problems. 

## 2015-12-05 NOTE — Discharge Instructions (Signed)

## 2015-12-07 ENCOUNTER — Ambulatory Visit: Payer: Medicare HMO | Admitting: Neurology

## 2015-12-24 ENCOUNTER — Encounter (HOSPITAL_COMMUNITY): Payer: Medicare HMO

## 2015-12-28 ENCOUNTER — Encounter: Payer: Self-pay | Admitting: *Deleted

## 2016-01-02 ENCOUNTER — Encounter (HOSPITAL_COMMUNITY): Payer: Self-pay

## 2016-01-02 ENCOUNTER — Encounter (HOSPITAL_COMMUNITY)
Admission: RE | Admit: 2016-01-02 | Discharge: 2016-01-02 | Disposition: A | Discharge status: Home / Self Care | Payer: Medicare HMO | Source: Ambulatory Visit | Attending: Neurology | Admitting: Neurology

## 2016-01-02 DIAGNOSIS — G35 Multiple sclerosis: Secondary | ICD-10-CM | POA: Diagnosis not present

## 2016-01-02 MED ORDER — SODIUM CHLORIDE 0.9 % IV SOLN
300.0000 mg | INTRAVENOUS | Status: DC
  Administered 2016-01-02: 300 mg via INTRAVENOUS
  Filled 2016-01-02: qty 15

## 2016-01-02 MED ORDER — SODIUM CHLORIDE 0.9 % IV SOLN
INTRAVENOUS | Status: DC
  Administered 2016-01-02: 10:00:00 via INTRAVENOUS

## 2016-01-02 NOTE — Progress Notes (Signed)
Uneventful Tysabri infusion.  Pt stayed for 20 min post infusion for flush, pt knows to call MD with questions and concerns.  Pt d/c ambulatory with cane to lobby with friend.

## 2016-01-03 ENCOUNTER — Encounter: Payer: Self-pay | Admitting: Neurology

## 2016-01-04 ENCOUNTER — Ambulatory Visit (INDEPENDENT_AMBULATORY_CARE_PROVIDER_SITE_OTHER): Payer: Medicare HMO | Admitting: Neurology

## 2016-01-04 ENCOUNTER — Encounter: Payer: Self-pay | Admitting: Neurology

## 2016-01-04 DIAGNOSIS — F418 Other specified anxiety disorders: Secondary | ICD-10-CM | POA: Diagnosis not present

## 2016-01-04 DIAGNOSIS — G4733 Obstructive sleep apnea (adult) (pediatric): Secondary | ICD-10-CM

## 2016-01-04 DIAGNOSIS — R5383 Other fatigue: Secondary | ICD-10-CM

## 2016-01-04 DIAGNOSIS — Z9989 Dependence on other enabling machines and devices: Secondary | ICD-10-CM

## 2016-01-04 DIAGNOSIS — R269 Unspecified abnormalities of gait and mobility: Secondary | ICD-10-CM

## 2016-01-04 DIAGNOSIS — G4700 Insomnia, unspecified: Secondary | ICD-10-CM

## 2016-01-04 DIAGNOSIS — G35 Multiple sclerosis: Secondary | ICD-10-CM

## 2016-01-04 NOTE — Progress Notes (Signed)
[facility]  PATIENT: Rachel Gilbert DOB: [birth year]  REFERRING DOCTOR OR PCP:  ER  No PCP SOURCE: ER notes, images on PACS, patient  _________________________________   HISTORICAL  CHIEF COMPLAINT:  Chief Complaint  Patient presents with  . Multiple Sclerosis    Sts. she continues to tolerate Tysabri well.  JCV ab last checked 11-02-15 and was negative at 0.18.  Sts. she is compliant with CPAP, sleeps well with it.  Sts. she feels more rested and has more energy.Chucky May  . Sleep Apnea    HISTORY OF PRESENT ILLNESS:  Rachel Gilbert is 55 y.o. woman with MS and OSA.    MS:   She has been on Tysabri since 05/2014.     She was low positive JCV Ab positive but last test was negative (0.18 on 11/02/2015).   We discussed the risks and benefits of Tysabri (about 1:2000 risk of PML assuming she fluctuates from low positive or negative)   OSA:   She has severe OSA. She is currently on CPAP +8 cm. She tolerates it well. I looked at the download from the last 53 days. It showed 100% compliance and a very good AHI equals 3.7. However, there was more than expected leak with 21.3 L/m.   The sleep study 2009 showed an AHI equals 38 with oxygen desaturations to 76%.   She notes that her mask sometimes leaks. She does not think that she snores through the mask.   She uses a FF mask and notes breathing through her mouth  Gait/strength/sensation/coordination:   Balance is poor affecting gait.  She hates going up steps as she feels off balanced.   She is moving into a one floor house next month.   She often uses a cane for balance.    Her legs are both mildly weak.   She has most trouble with changes in surface or with curbs.   . She denies weakness in the arms. She denies any numbness in the arms or legs. She has mild leg clumsiness. Ampyra has helped her gait and she has been on x 2 years.       Bladder/bowel: She has urinary urgency, frequency and nocturia (twice a night). Ditropan XL 10 mg  has helped but she just takes now and then since starting the new CPAP machine.   At most she has one x nocturia.     Vision: She feels her vision is back to baseline.  Fatigue/sleep: She has physical and mental fatigue.  Fatigue is better on Tysabri than previous treatments.  She laso feels a bit better with the new CPAP machine.   Ampyra helps her gait endurance as she is able to walk a little further with it.  Phentermine helps as much as Adderall but makes her feel hot at times.    She has insomnia, both sleep onset and sleep maintenance.   Mood/Cognition:   She has had issues with depression and anxiety but feels much better.   Her boyfriend died in a motorcycle accident last year.    She has done grief counseling.   She feels her cognitive fog is reduced since being on Tysabri and with stimulants.       MS history: She was diagnosed in September 2012 after presenting with diplopia. She was hospitalized and received 5 days of IV steroids and she had MRI and lumbar puncture. She recalls that the lumbar puncture was not consistent with MS but the MRI was.  She was  placed on Gilenya in December 2012. She was able to return to work and the diplopia completely resolved. Then in July 2014 she had another exacerbation with visual disturbance bilaterally, gait changes and cognitive changes.  Reportedly, there was a new lesion in the brainstem and also in the spinal cord at that time. She received 5 more days of steroid.    In early May 2016, she had the onset of several symptoms including worse cognitive problems, worse gait problems and weakness in the hands.   She went to the [facility] on 06/03/2014 and she was evaluated. An admitted and received 3 days of IV Solu-Medrol.The MRI of the brain shows many white matter lesions. Some are periventricular with an appearance consistent with MS. She also has deep white matter subcortical and juxtacortical foci. Some of these look more  consistent with small vessel ischemic changes and most more consistent with multiple sclerosis. She does have new foci in the left vermis of the cerebellum and in the left frontal periventricular region.[name]    REVIEW OF SYSTEMS: Constitutional: No fevers, chills, sweats, or change in appetite.   She has fatigue Eyes: No visual changes, double vision, eye pain Ear, nose and throat: No hearing loss, ear pain, nasal congestion, sore throat Cardiovascular: No chest pain, palpitations Respiratory: No shortness of breath at rest or with exertion.   No wheezes GastrointestinaI: No nausea, vomiting, diarrhea, abdominal pain, fecal incontinence Genitourinary: No dysuria, urinary retention.   Frequency better on medications. Musculoskeletal: No neck pain, back pain Integumentary: No rash, pruritus, skin lesions Neurological: as above Psychiatric: No depression at this time.  No anxiety Endocrine: No palpitations, diaphoresis, change in appetite, change in weigh or increased thirst Hematologic/Lymphatic: No anemia, purpura, petechiae. Allergic/Immunologic: No itchy/runny eyes, nasal congestion, recent allergic reactions, rashes  ALLERGIES: Allergies  Allergen Reactions  . Oxycodone Anaphylaxis    HOME MEDICATIONS:  Current Outpatient Prescriptions:  .  acetaminophen (TYLENOL) 500 MG tablet, Take 500 mg by mouth every 6 (six) hours as needed for mild pain., Disp: , Rfl:  .  ALPRAZolam (XANAX) 0.5 MG tablet, Take 0.5 mg by mouth 2 (two) times daily as needed for anxiety., Disp: , Rfl:  .  baclofen (LIORESAL) 10 MG tablet, Take 1 tablet (10 mg total) by mouth 3 (three) times daily as needed for muscle spasms., Disp: 90 each, Rfl: 3 .  carvedilol (COREG) 25 MG tablet, Take 25 mg by mouth 2 (two) times daily with a meal., Disp: , Rfl:  .  cholecalciferol (VITAMIN D) 1000 units tablet, Take 5,000 Units by mouth daily., Disp: , Rfl:  .  dalfampridine 10 MG TB12, Take 1 tablet (10 mg total) by mouth  2 (two) times daily., Disp: 60 tablet, Rfl: 11 .  Dextromethorphan-Quinidine (NUEDEXTA) 20-10 MG CAPS, One pill po twice a day, Disp: 60 capsule, Rfl: 11 .  doxazosin (CARDURA) 4 MG tablet, Take 4 mg by mouth daily. Reported on 06/06/2015, Disp: , Rfl:  .  FLUoxetine (PROZAC) 40 MG capsule, Take 1 capsule (40 mg total) by mouth daily., Disp: 30 capsule, Rfl: 3 .  hydrALAZINE (APRESOLINE) 50 MG tablet, Take 50 mg by mouth 2 (two) times daily., Disp: , Rfl:  .  ibuprofen (ADVIL,MOTRIN) 200 MG tablet, Take 200 mg by mouth every 6 (six) hours as needed for moderate pain., Disp: , Rfl:  .  lisinopril (PRINIVIL,ZESTRIL) 40 MG tablet, Take 40 mg by mouth daily., Disp: , Rfl:  .  natalizumab (TYSABRI) 300 MG/15ML  injection, Inject into the vein., Disp: , Rfl:  .  ondansetron (ZOFRAN) 4 MG tablet, Take 4 mg by mouth every 8 (eight) hours as needed for nausea or vomiting., Disp: , Rfl:  .  oxybutynin (DITROPAN XL) 10 MG 24 hr tablet, Take 1 tablet (10 mg total) by mouth at bedtime., Disp: 30 tablet, Rfl: 11 .  phentermine 37.5 MG capsule, Take 1 capsule (37.5 mg total) by mouth every morning., Disp: 30 capsule, Rfl: 5 .  ranitidine (ZANTAC) 300 MG capsule, Take 300 mg by mouth every evening., Disp: , Rfl:  .  spironolactone (ALDACTONE) 50 MG tablet, Take 50 mg by mouth daily., Disp: , Rfl:  .  sulfamethoxazole-trimethoprim (BACTRIM DS,SEPTRA DS) 800-160 MG tablet, Take 1 tablet by mouth 2 (two) times daily., Disp: 14 tablet, Rfl: 0 .  traZODone (DESYREL) 100 MG tablet, Take 1 tablet (100 mg total) by mouth at bedtime., Disp: 30 tablet, Rfl: 11 .  Vitamin D, Ergocalciferol, (DRISDOL) 50000 units CAPS capsule, Take 50,000 Units by mouth every 7 (seven) days., Disp: , Rfl:   PAST MEDICAL HISTORY: Past Medical History:  Diagnosis Date  . Complication of anesthesia    hard time waking up   . Hearing loss   . Hypertension   . Kidney stones   . Multiple sclerosis (HCC)   . Sleep apnea    Uses CPAP  .  Vision abnormalities     PAST SURGICAL HISTORY: Past Surgical History:  Procedure Laterality Date  . CESAREAN SECTION  1986  . CHOLECYSTECTOMY  2000   laparoscopic    FAMILY HISTORY: Family History  Problem Relation Age of Onset  . Fibromyalgia Mother   . Stroke Father   . Diabetes Father   . Stroke Other     SOCIAL HISTORY:  Social History   Social History  . Marital status: Single    Spouse name: N/A  . Number of children: N/A  . Years of education: N/A   Occupational History  . Not on file.   Social History Main Topics  . Smoking status: Never Smoker  . Smokeless tobacco: Never Used  . Alcohol use Yes     Comment: Occasional  . Drug use: No  . Sexual activity: Not on file   Other Topics Concern  . Not on file   Social History Narrative  . No narrative on file     PHYSICAL EXAM  There were no vitals filed for this visit.  There is no height or weight on file to calculate BMI.   General: The patient is well-developed and well-nourished and in no acute distress  Neurologic Exam  Mental status: The patient is alert and oriented x 3 at the time of the examination.  Affect is good today.  .  The patient has apparent normal recent and remote memory but decreased attention span and concentration ability.   Speech is normal.  Cranial nerves: Extraocular movements are full. Facial symmetry is present. There is good facial sensation to soft touch bilaterally.Facial strength is normal.  Trapezius and sternocleidomastoid strength is normal. No dysarthria is noted.  The tongue is midline, and the patient has  elevation of the soft palate.   Motor:  Muscle bulk is normal.   Tone is normal. Strength is  5 / 5 in all 4 extremities.   Sensory: Sensory testing is intact to pinprick and vibration sensation in all 4 extremities.  Coordination: Cerebellar testing reveals good finger-nose-finger and reduced left heel-to-shin .  Gait  and station: Station is normal.    Gait is wide.  She cannot tandem walk Romberg is borderline.   Reflexes: Deep tendon reflexes are symmetric and slightly increased in legs.       DIAGNOSTIC DATA (LABS, IMAGING, TESTING) - I reviewed patient records, labs, notes, testing and imaging myself where available.  Lab Results  Component Value Date   WBC 14.8 (H) 11/01/2015   HGB 14.1 06/07/2014   HCT 43.4 11/01/2015   MCV 88 11/01/2015   PLT 331 11/01/2015      Component Value Date/Time   NA 144 02/12/2015 1139   K 4.2 02/12/2015 1139   CL 105 02/12/2015 1139   CO2 20 02/12/2015 1139   GLUCOSE 97 02/12/2015 1139   GLUCOSE 133 (H) 06/07/2014 0601   BUN 15 02/12/2015 1139   CREATININE 1.13 (H) 02/12/2015 1139   CALCIUM 9.5 02/12/2015 1139   PROT 6.1 11/16/2014 1047   ALBUMIN 4.5 11/16/2014 1047   AST 14 11/16/2014 1047   ALT 16 11/16/2014 1047   ALKPHOS 60 11/16/2014 1047   BILITOT 1.2 11/16/2014 1047   GFRNONAA 56 (L) 02/12/2015 1139   GFRAA 64 02/12/2015 1139      ASSESSMENT AND PLAN  Multiple sclerosis (HCC)  Obstructive sleep apnea on CPAP  Gait disturbance  Depression with anxiety  Other fatigue  Insomnia, unspecified type   1.   She is doing well with CPAP. She has perfect compliance and very good efficacy however she has some leaking. She will discuss a different mask with the DME company. 2.    Continue Tysabri. She is JCV antibody negative on last testing but had low positive JCV antibody implying some risk of PML on previous test.   We will recheck her status again in 4 months.  If she converts to high positive,  consider a change to ocrelizumab .    Additionally, around the time of the next visit we will check an MRI of the brain to determine if there is any subclinical progression. If this is occurring we will also consider a change in therapy.  3..   Continue Ampyra.   4.    Continue Phentermine 37.5 mg to help with weight loss,  fatigue and cognitive fog.    5.   She will return in 4  months for f/u with me or call sooner if problems   Richard A. Epimenio FootSater, MD, PhD 01/04/2016, 11:02 AM Certified in Neurology, Clinical Neurophysiology, Sleep Medicine, Pain Medicine and Neuroimaging  [facility] [address], [address] [place], [place] [address] [phone]

## 2016-01-07 ENCOUNTER — Telehealth: Payer: Self-pay | Admitting: *Deleted

## 2016-01-07 NOTE — Telephone Encounter (Signed)
Noted/fim 

## 2016-01-24 ENCOUNTER — Encounter (HOSPITAL_COMMUNITY): Payer: Medicare HMO

## 2016-02-01 ENCOUNTER — Telehealth: Payer: Self-pay | Admitting: *Deleted

## 2016-02-01 NOTE — Telephone Encounter (Signed)
Pt. has a new [employer] ins. policy and wanted to make sure Ty infusions are still approved to be done at [facility].  I have spoken with [place] at [place] and confirmed that the [place]. for Ty infusions that we obtained last year has carried over to this [employer], as a continuation of therapy.  Auth# [id number].  No new auth needed until 06-06-16.  I lmom pt's home # to let her know this has been verified./fim

## 2016-02-06 ENCOUNTER — Encounter (HOSPITAL_COMMUNITY): Payer: Medicare HMO

## 2016-02-20 ENCOUNTER — Encounter (HOSPITAL_COMMUNITY)
Admission: RE | Admit: 2016-02-20 | Discharge: 2016-02-20 | Disposition: A | Discharge status: Home / Self Care | Payer: Medicare HMO | Source: Ambulatory Visit | Attending: Neurology | Admitting: Neurology

## 2016-02-20 ENCOUNTER — Encounter (HOSPITAL_COMMUNITY): Payer: Self-pay

## 2016-02-20 DIAGNOSIS — G35 Multiple sclerosis: Secondary | ICD-10-CM | POA: Insufficient documentation

## 2016-02-20 MED ORDER — NATALIZUMAB 300 MG/15ML IV CONC
300.0000 mg | INTRAVENOUS | Status: AC
  Administered 2016-02-20: 300 mg via INTRAVENOUS
  Filled 2016-02-20: qty 15

## 2016-02-20 MED ORDER — SODIUM CHLORIDE 0.9 % IV SOLN
INTRAVENOUS | Status: AC
  Administered 2016-02-20: 10:00:00 via INTRAVENOUS

## 2016-02-20 NOTE — Discharge Instructions (Signed)

## 2016-02-20 NOTE — Progress Notes (Signed)
Pt. Stayed 15 minutes post-infusion of Tysabri. Pt. To follow-up with doctor with any problems.

## 2016-02-26 ENCOUNTER — Telehealth: Payer: Self-pay | Admitting: *Deleted

## 2016-02-26 MED ORDER — DALFAMPRIDINE ER 10 MG PO TB12: 10.0000 mg | ORAL_TABLET | Freq: Two times a day (BID) | ORAL | 3 refills | Status: DC

## 2016-02-26 NOTE — Telephone Encounter (Signed)
I have spoken with Rachel Gilbert and was told it takes 24 hours for their faxes to be confirmed.  No new rx. needed at this time/fim

## 2016-02-26 NOTE — Telephone Encounter (Signed)
Ampyra rx. faxed to [facility] fax# [phone], per faxed request from Ampyra Pt. Support Services/fim

## 2016-02-26 NOTE — Telephone Encounter (Signed)
Rachel Gilbert [phone] called said she just spoke with [name] [phone] and was advised they did not have RX. Msg relayed it had been faxed this morning at 7:41. She verified fax number as the same. She is wanting RN to call [name] to confirm fax was sent

## 2016-03-05 ENCOUNTER — Encounter (HOSPITAL_COMMUNITY): Payer: Medicare HMO

## 2016-03-19 ENCOUNTER — Encounter (HOSPITAL_COMMUNITY)
Admission: RE | Admit: 2016-03-19 | Discharge: 2016-03-19 | Disposition: A | Discharge status: Home / Self Care | Payer: Medicare HMO | Source: Ambulatory Visit | Attending: Neurology | Admitting: Neurology

## 2016-03-19 ENCOUNTER — Encounter (HOSPITAL_COMMUNITY): Payer: Self-pay

## 2016-03-19 DIAGNOSIS — G35 Multiple sclerosis: Secondary | ICD-10-CM | POA: Diagnosis present

## 2016-03-19 MED ORDER — SODIUM CHLORIDE 0.9 % IV SOLN
300.0000 mg | INTRAVENOUS | Status: DC
  Administered 2016-03-19: 300 mg via INTRAVENOUS
  Filled 2016-03-19: qty 15

## 2016-03-19 MED ORDER — SODIUM CHLORIDE 0.9 % IV SOLN
INTRAVENOUS | Status: DC
  Administered 2016-03-19: 10:00:00 via INTRAVENOUS

## 2016-03-19 NOTE — Progress Notes (Signed)
Pt. Stayed 15 minutes post- infusion Tysabri. Pt. To follow-up with doctor with any problems.

## 2016-03-19 NOTE — Discharge Instructions (Signed)
°Tysabri  °Natalizumab injection °What is this medicine? °NATALIZUMAB (na ta LIZ you mab) is used to treat relapsing multiple sclerosis. This drug is not a cure. It is also used to treat Crohn's disease. °This medicine may be used for other purposes; ask your health care provider or pharmacist if you have questions. °COMMON BRAND NAME(S): Tysabri °What should I tell my health care provider before I take this medicine? °They need to know if you have any of these conditions: °-immune system problems °-progressive multifocal leukoencephalopathy (PML) °-an unusual or allergic reaction to natalizumab, other medicines, foods, dyes, or preservatives °-pregnant or trying to get pregnant °-breast-feeding °How should I use this medicine? °This medicine is for infusion into a vein. It is given by a health care professional in a hospital or clinic setting. °A special MedGuide will be given to you by the pharmacist with each prescription and refill. Be sure to read this information carefully each time. °Talk to your pediatrician regarding the use of this medicine in children. This medicine is not approved for use in children. °Overdosage: If you think you have taken too much of this medicine contact a poison control center or emergency room at once. °NOTE: This medicine is only for you. Do not share this medicine with others. °What if I miss a dose? °It is important not to miss your dose. Call your doctor or health care professional if you are unable to keep an appointment. °What may interact with this medicine? °-azathioprine °-cyclosporine °-interferon °-6-mercaptopurine °-methotrexate °-steroid medicines like prednisone or cortisone °-TNF-alpha inhibitors like adalimumab, etanercept, and infliximab °-vaccines °This list may not describe all possible interactions. Give your health care provider a list of all the medicines, herbs, non-prescription drugs, or dietary supplements you use. Also tell them if you smoke, drink  alcohol, or use illegal drugs. Some items may interact with your medicine. °What should I watch for while using this medicine? °Your condition will be monitored carefully while you are receiving this medicine. Visit your doctor for regular check ups. Tell your doctor or healthcare professional if your symptoms do not start to get better or if they get worse. °Stay away from people who are sick. Call your doctor or health care professional for advice if you get a fever, chills or sore throat, or other symptoms of a cold or flu. Do not treat yourself. °In some patients, this medicine may cause a serious brain infection that may cause death. If you have any problems seeing, thinking, speaking, walking, or standing, tell your doctor right away. If you cannot reach your doctor, get urgent medical care. °What side effects may I notice from receiving this medicine? °Side effects that you should report to your doctor or health care professional as soon as possible: °-allergic reactions like skin rash, itching or hives, swelling of the face, lips, or tongue °-breathing problems °-changes in vision °-chest pain °-dark urine °-depression, feelings of sadness °-dizziness °-general ill feeling or flu-like symptoms °-irregular, missed, or painful menstrual periods °-light-colored stools °-loss of appetite, nausea °-muscle weakness °-problems with balance, talking, or walking °-right upper belly pain °-unusually weak or tired °-yellowing of the eyes or skin °Side effects that usually do not require medical attention (report to your doctor or health care professional if they continue or are bothersome): °-aches, pains °-headache °-stomach upset °-tiredness °This list may not describe all possible side effects. Call your doctor for medical advice about side effects. You may report side effects to FDA at 1-800-FDA-1088. °Where should   I keep my medicine? °This drug is given in a hospital or clinic and will not be stored at  home. °NOTE: This sheet is a summary. It may not cover all possible information. If you have questions about this medicine, talk to your doctor, pharmacist, or health care provider. °© 2017 [employer] (2008-03-04 13:33:21) ° °

## 2016-04-03 ENCOUNTER — Encounter (HOSPITAL_COMMUNITY): Payer: Medicare HMO

## 2016-04-18 ENCOUNTER — Encounter (HOSPITAL_COMMUNITY)
Admission: RE | Admit: 2016-04-18 | Discharge: 2016-04-18 | Disposition: A | Discharge status: Home / Self Care | Payer: Medicare HMO | Source: Ambulatory Visit | Attending: Neurology | Admitting: Neurology

## 2016-04-18 ENCOUNTER — Encounter (HOSPITAL_COMMUNITY): Payer: Self-pay

## 2016-04-18 DIAGNOSIS — G35 Multiple sclerosis: Secondary | ICD-10-CM | POA: Diagnosis present

## 2016-04-18 MED ORDER — SODIUM CHLORIDE 0.9 % IV SOLN
INTRAVENOUS | Status: DC
  Administered 2016-04-18: 10:00:00 via INTRAVENOUS

## 2016-04-18 MED ORDER — SODIUM CHLORIDE 0.9 % IV SOLN
300.0000 mg | INTRAVENOUS | Status: DC
  Administered 2016-04-18: 300 mg via INTRAVENOUS
  Filled 2016-04-18: qty 15

## 2016-04-18 NOTE — Progress Notes (Signed)
Uneventful Tysabri infusion. Pt stayed for 25 minutes post infusion.  Pt knows to call md with questions and concerns.  Pt d/c via wheelchair to lobby with her friend.

## 2016-05-01 ENCOUNTER — Ambulatory Visit: Payer: Medicare HMO | Admitting: Neurology

## 2016-05-07 ENCOUNTER — Ambulatory Visit (INDEPENDENT_AMBULATORY_CARE_PROVIDER_SITE_OTHER): Payer: Medicare HMO | Admitting: Neurology

## 2016-05-07 ENCOUNTER — Encounter: Payer: Self-pay | Admitting: Neurology

## 2016-05-07 VITALS — BP 132/82 | HR 69 | Resp 20 | Ht 69.0 in | Wt 274.0 lb

## 2016-05-07 DIAGNOSIS — Z9989 Dependence on other enabling machines and devices: Secondary | ICD-10-CM | POA: Diagnosis not present

## 2016-05-07 DIAGNOSIS — R350 Frequency of micturition: Secondary | ICD-10-CM | POA: Diagnosis not present

## 2016-05-07 DIAGNOSIS — F482 Pseudobulbar affect: Secondary | ICD-10-CM

## 2016-05-07 DIAGNOSIS — F418 Other specified anxiety disorders: Secondary | ICD-10-CM | POA: Diagnosis not present

## 2016-05-07 DIAGNOSIS — R269 Unspecified abnormalities of gait and mobility: Secondary | ICD-10-CM | POA: Diagnosis not present

## 2016-05-07 DIAGNOSIS — G4733 Obstructive sleep apnea (adult) (pediatric): Secondary | ICD-10-CM

## 2016-05-07 DIAGNOSIS — G35 Multiple sclerosis: Secondary | ICD-10-CM | POA: Diagnosis not present

## 2016-05-07 DIAGNOSIS — R5383 Other fatigue: Secondary | ICD-10-CM

## 2016-05-07 MED ORDER — OXYBUTYNIN CHLORIDE ER 15 MG PO TB24: 15.0000 mg | ORAL_TABLET | Freq: Every day | ORAL | 11 refills | Status: DC

## 2016-05-07 MED ORDER — PHENTERMINE HCL 37.5 MG PO CAPS: 37.5000 mg | ORAL_CAPSULE | ORAL | 5 refills | Status: DC

## 2016-05-07 NOTE — Progress Notes (Signed)
[facility]  PATIENT: Rachel Gilbert DOB: 07/29/1960  REFERRING DOCTOR OR PCP:  ER  No PCP SOURCE: ER notes, images on PACS, patient  _________________________________   HISTORICAL  CHIEF COMPLAINT:  Chief Complaint  Patient presents with  . Multiple Sclerosis    Sts. she continues to tolerate Tysabri well.  JCV ab last checked 11/02/15 and was negative at 0.18.  She has not had Ampyra since Dec.2017--believes it helped, but is not able to afford copay (over $400 for 30 day supply.).  She has moved and so is not having to climb stairs any longer so this helps.  Is having more difficulty walking today but feels this is due to ill fitting shoes--sandals are loose and flop on her foot a little/fim  . Refills Needed    Phentermine    HISTORY OF PRESENT ILLNESS:  [name] is 56 y.o. woman with MS and OSA.    MS:   She has been on Tysabri since 05/2014.   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   We discussed the risks and benefits of Tysabri (about 1:2000 risk of PML assuming she fluctuates from low positive or negative)   OSA:   She has severe OSA. She is currently on CPAP +8 cm. She tolerates it well. I looked at the download from the last 53 days. It showed 100% compliance and a very good AHI equals 3.7. However, there was more than expected leak with 21.3 L/m.   The sleep study 2009 showed an AHI equals 38 with oxygen desaturations to 76%.   She notes that her mask sometimes leaks. She does not think that she snores through the mask.   She uses a FF mask and notes breathing through her mouth  Gait/strength/sensation/coordination:   Gait is slightly better.   Her new home does not have stairs and she does not tire out as much getting about.   Ampyra helped but the co-pay was very high at $400 and she cannot afford that. She does not think that she could afford the compounded drug (which is usually about $60 a  month).    Balance is poor affecting gait.    She often uses a cane for balance.    Her legs are both mildly weak.   She has most trouble with changes in surface or with curbs.   . She denies weakness in the arms. She denies any numbness in the arms or legs. She has mild leg clumsiness. Ampyra has helped her gait and she has been on x 2 years.       Bladder/bowel: She has urinary urgency, frequency and nocturia (twice a night). Ditropan XL 10 mg has helped but she just takes now and then since starting the new CPAP machine.   At most she has one x nocturia.     Vision: She feels her vision is fine.  Fatigue/sleep: She has physical and mental fatigue.  Fatigue is better on Tysabri than previous treatments.  She feels less fstigue and sleepiness with her CPAP machine.   Ampyra helps her gait endurance as she is able to walk a little further with it.  Phentermine helps as much as Adderall but makes her feel hot at times.    She has insomnia, both sleep onset and sleep maintenance.   Mood/Cognition:   She still has some crying spells and sometimes inappropriate crying.    She could  not afford Nuedexta.    Her boyfriend died in a motorcycle accident last year.    She has done grief counseling.   She feels her cognitive fog is reduced since being on Tysabri and with stimulants.       MS history: She was diagnosed in September 2012 after presenting with diplopia. She was hospitalized and received 5 days of IV steroids and she had MRI and lumbar puncture. She recalls that the lumbar puncture was not consistent with MS but the MRI was.  She was placed on Gilenya in December 2012. She was able to return to work and the diplopia completely resolved. Then in July 2014 she had another exacerbation with visual disturbance bilaterally, gait changes and cognitive changes.  Reportedly, there was a new lesion in the brainstem and also in the spinal cord at that time. She received 5 more days of steroid.    In early May  2016, she had the onset of several symptoms including worse cognitive problems, worse gait problems and weakness in the hands.   She went to the [place] [facility] emergency room on 06/03/2014 and she was evaluated. An admitted and received 3 days of IV Solu-Medrol.The MRI of the brain shows many white matter lesions. Some are periventricular with an appearance consistent with MS. She also has deep white matter subcortical and juxtacortical foci. Some of these look more consistent with small vessel ischemic changes and most more consistent with multiple sclerosis. She does have new foci in the left vermis of the cerebellum and in the left frontal periventricular region.[name]    REVIEW OF SYSTEMS: Constitutional: No fevers, chills, sweats, or change in appetite.   She has fatigue Eyes: No visual changes, double vision, eye pain Ear, nose and throat: No hearing loss, ear pain, nasal congestion, sore throat Cardiovascular: No chest pain, palpitations Respiratory: No shortness of breath at rest or with exertion.   No wheezes GastrointestinaI: No nausea, vomiting, diarrhea, abdominal pain, fecal incontinence Genitourinary: No dysuria, urinary retention.   Frequency better on medications. Musculoskeletal: No neck pain, back pain Integumentary: No rash, pruritus, skin lesions Neurological: as above Psychiatric: No depression at this time.  No anxiety Endocrine: No palpitations, diaphoresis, change in appetite, change in weigh or increased thirst Hematologic/Lymphatic: No anemia, purpura, petechiae. Allergic/Immunologic: No itchy/runny eyes, nasal congestion, recent allergic reactions, rashes  ALLERGIES: Allergies  Allergen Reactions  . Oxycodone Anaphylaxis    HOME MEDICATIONS:  Current Outpatient Prescriptions:  .  acetaminophen (TYLENOL) 500 MG tablet, Take 500 mg by mouth every 6 (six) hours as needed for mild pain., Disp: , Rfl:  .  ALPRAZolam (XANAX) 0.5 MG tablet, Take 0.5 mg by mouth 2  (two) times daily as needed for anxiety., Disp: , Rfl:  .  baclofen (LIORESAL) 10 MG tablet, Take 1 tablet (10 mg total) by mouth 3 (three) times daily as needed for muscle spasms., Disp: 90 each, Rfl: 3 .  carvedilol (COREG) 25 MG tablet, Take 25 mg by mouth 2 (two) times daily with a meal., Disp: , Rfl:  .  cholecalciferol (VITAMIN D) 1000 units tablet, Take 5,000 Units by mouth daily., Disp: , Rfl:  .  Dextromethorphan-Quinidine (NUEDEXTA) 20-10 MG CAPS, One pill po twice a day, Disp: 60 capsule, Rfl: 11 .  doxazosin (CARDURA) 4 MG tablet, Take 4 mg by mouth daily. Reported on 06/06/2015, Disp: , Rfl:  .  FLUoxetine (PROZAC) 40 MG capsule, Take 1 capsule (40 mg total) by mouth daily., Disp: 30 capsule, Rfl:  3 .  hydrALAZINE (APRESOLINE) 50 MG tablet, Take 50 mg by mouth 2 (two) times daily., Disp: , Rfl:  .  ibuprofen (ADVIL,MOTRIN) 200 MG tablet, Take 200 mg by mouth every 6 (six) hours as needed for moderate pain., Disp: , Rfl:  .  lisinopril (PRINIVIL,ZESTRIL) 40 MG tablet, Take 40 mg by mouth daily., Disp: , Rfl:  .  natalizumab (TYSABRI) 300 MG/15ML injection, Inject into the vein., Disp: , Rfl:  .  ondansetron (ZOFRAN) 4 MG tablet, Take 4 mg by mouth every 8 (eight) hours as needed for nausea or vomiting., Disp: , Rfl:  .  oxybutynin (DITROPAN XL) 15 MG 24 hr tablet, Take 1 tablet (15 mg total) by mouth at bedtime., Disp: 30 tablet, Rfl: 11 .  phentermine 37.5 MG capsule, Take 1 capsule (37.5 mg total) by mouth every morning., Disp: 30 capsule, Rfl: 5 .  ranitidine (ZANTAC) 300 MG capsule, Take 300 mg by mouth every evening., Disp: , Rfl:  .  spironolactone (ALDACTONE) 50 MG tablet, Take 50 mg by mouth daily., Disp: , Rfl:  .  sulfamethoxazole-trimethoprim (BACTRIM DS,SEPTRA DS) 800-160 MG tablet, Take 1 tablet by mouth 2 (two) times daily., Disp: 14 tablet, Rfl: 0 .  traZODone (DESYREL) 100 MG tablet, Take 1 tablet (100 mg total) by mouth at bedtime., Disp: 30 tablet, Rfl: 11 .  Vitamin D,  Ergocalciferol, (DRISDOL) 50000 units CAPS capsule, Take 50,000 Units by mouth every 7 (seven) days., Disp: , Rfl:  .  dalfampridine 10 MG TB12, Take 1 tablet (10 mg total) by mouth 2 (two) times daily. (Patient not taking: Reported on 05/07/2016), Disp: 180 tablet, Rfl: 3  PAST MEDICAL HISTORY: Past Medical History:  Diagnosis Date  . Complication of anesthesia    hard time waking up   . Hearing loss   . Hypertension   . Kidney stones   . Multiple sclerosis (HCC)   . Sleep apnea    Uses CPAP  . Vision abnormalities     PAST SURGICAL HISTORY: Past Surgical History:  Procedure Laterality Date  . CESAREAN SECTION  1986  . CHOLECYSTECTOMY  2000   laparoscopic    FAMILY HISTORY: Family History  Problem Relation Age of Onset  . Fibromyalgia Mother   . Stroke Father   . Diabetes Father   . Stroke Other     SOCIAL HISTORY:  Social History   Social History  . Marital status: Single    Spouse name: N/A  . Number of children: N/A  . Years of education: N/A   Occupational History  . Not on file.   Social History Main Topics  . Smoking status: Never Smoker  . Smokeless tobacco: Never Used  . Alcohol use Yes     Comment: Occasional  . Drug use: No  . Sexual activity: Not on file   Other Topics Concern  . Not on file   Social History Narrative  . No narrative on file     PHYSICAL EXAM  Vitals:   05/07/16 1049  BP: 132/82  Pulse: 69  Resp: 20  Weight: 274 lb (124.3 kg)  Height: 5\' 9"  (1.753 m)    Body mass index is 40.46 kg/m.   General: The patient is well-developed and well-nourished and in no acute distress  Neurologic Exam  Mental status: The patient is alert and oriented x 3 at the time of the examination.  Affect is good today.  .  The patient has apparent normal recent and remote memory but  decreased attention span and concentration ability.   Speech is normal.  Cranial nerves: Extraocular movements are full. Facial strength and sensation is  normal.. treapezius and sternocleidomastoid strength is normal. No dysarthria is noted.  The tongue is midline, and the patient has  elevation of the soft palate.   Motor:  Muscle bulk is normal.   Tone is normal. Strength is  5 / 5 in all 4 extremities.   Sensory: Sensory testing is intact to pinprick and vibration sensation in all 4 extremities.  Coordination: Cerebellar testing reveals good finger-nose-finger and reduced left heel-to-shin .  Gait and station: Station is normal.   Gait is wide.  She is unable to do a tandem walk without holding on.   Reflexes: Deep tendon reflexes are symmetric and slightly increased in legs.       DIAGNOSTIC DATA (LABS, IMAGING, TESTING) - I reviewed patient records, labs, notes, testing and imaging myself where available.  Lab Results  Component Value Date   WBC 14.8 (H) 11/01/2015   HGB 14.1 06/07/2014   HCT 43.4 11/01/2015   MCV 88 11/01/2015   PLT 331 11/01/2015      Component Value Date/Time   NA 144 02/12/2015 1139   K 4.2 02/12/2015 1139   CL 105 02/12/2015 1139   CO2 20 02/12/2015 1139   GLUCOSE 97 02/12/2015 1139   GLUCOSE 133 (H) 06/07/2014 0601   BUN 15 02/12/2015 1139   CREATININE 1.13 (H) 02/12/2015 1139   CALCIUM 9.5 02/12/2015 1139   PROT 6.1 11/16/2014 1047   ALBUMIN 4.5 11/16/2014 1047   AST 14 11/16/2014 1047   ALT 16 11/16/2014 1047   ALKPHOS 60 11/16/2014 1047   BILITOT 1.2 11/16/2014 1047   GFRNONAA 56 (L) 02/12/2015 1139   GFRAA 64 02/12/2015 1139      ASSESSMENT AND PLAN  Multiple sclerosis (HCC) - Plan: MR BRAIN W WO CONTRAST, CBC with Differential/Platelet, Stratify JCV Antibody Test ([facility])  Gait disturbance - Plan: MR BRAIN W WO CONTRAST  Obstructive sleep apnea on CPAP  Urinary frequency  Pseudobulbar affect  Other fatigue  Depression with anxiety   1.     Continue Tysabri. She is JCV antibody negative on last testing but had low positive JCV antibody implying some risk of PML on  previous test.   Chck JCV Ab.  If she converts to high positive,  consider a change to ocrelizumab .  Check an MRI of the brain to determine if there is any subclinical progression. If this is occurring we will also consider a change in therapy.  2.   Increase oxybutynin XL to 15 mg nightly.   3.    Continue Phentermine 37.5 mg to help with weight loss,  fatigue and cognitive fog.    4.   She will return in 6 months for f/u with me or call sooner if problems   Jasia Hiltunen A. Epimenio Foot, MD, PhD 05/07/2016, 11:25 AM Certified in Neurology, Clinical Neurophysiology, Sleep Medicine, Pain Medicine and Neuroimaging  [facility] [address], [address] [place], [place] [address] [phone]                          8

## 2016-05-08 LAB — CBC WITH DIFFERENTIAL/PLATELET
Basophils Absolute: 0.1 10*3/uL (ref 0.0–0.2)
Basos: 0 %
EOS (ABSOLUTE): 0.4 10*3/uL (ref 0.0–0.4)
EOS: 3 %
HEMATOCRIT: 43.7 % (ref 34.0–46.6)
HEMOGLOBIN: 14.7 g/dL (ref 11.1–15.9)
IMMATURE GRANS (ABS): 0.1 10*3/uL (ref 0.0–0.1)
Immature Granulocytes: 1 %
LYMPHS ABS: 3 10*3/uL (ref 0.7–3.1)
Lymphs: 26 %
MCH: 29.2 pg (ref 26.6–33.0)
MCHC: 33.6 g/dL (ref 31.5–35.7)
MCV: 87 fL (ref 79–97)
MONOCYTES: 6 %
Monocytes Absolute: 0.7 10*3/uL (ref 0.1–0.9)
NEUTROS ABS: 7.3 10*3/uL — AB (ref 1.4–7.0)
Neutrophils: 64 %
Platelets: 331 10*3/uL (ref 150–379)
RBC: 5.04 x10E6/uL (ref 3.77–5.28)
RDW: 15.6 % — ABNORMAL HIGH (ref 12.3–15.4)
WBC: 11.5 10*3/uL — ABNORMAL HIGH (ref 3.4–10.8)

## 2016-05-14 ENCOUNTER — Telehealth: Payer: Self-pay | Admitting: Neurology

## 2016-05-14 MED ORDER — OXYBUTYNIN CHLORIDE ER 15 MG PO TB24: 15.0000 mg | ORAL_TABLET | Freq: Every day | ORAL | 3 refills | Status: DC

## 2016-05-14 MED ORDER — OXYBUTYNIN CHLORIDE ER 10 MG PO TB24: 10.0000 mg | ORAL_TABLET | Freq: Every day | ORAL | 3 refills | Status: DC

## 2016-05-14 NOTE — Telephone Encounter (Signed)
Pt called the office said she has rec'd paperwork from the [employer] for help with cost of the MRI. She is wanting to discuss with RN, she said Dr Epimenio Foot will need to sign paperwork as well. Please call

## 2016-05-14 NOTE — Telephone Encounter (Signed)
I spoke with Rachel Gilbert.  1--she will drop off MS paperwork Monday and I will complete and fax back to the [employer]. 2--She is not able to afford $68 copay for 30 day supply of Oxybutynin 15mg  ER.  I have checked [email or web address].  There is a coupon a 90 day supply of the same med for $7160.  I have faxed [name]'s rx. and coupon to [facility], with request that they call her to verify cost prior to filling rx, as she will be driving from [place] to pick it up, so if [facility] price is not correct, she may need to cancel rx./fim

## 2016-05-15 ENCOUNTER — Telehealth: Payer: Self-pay | Admitting: Neurology

## 2016-05-15 MED ORDER — PHENTERMINE HCL 37.5 MG PO TABS: 37.5000 mg | ORAL_TABLET | Freq: Every day | ORAL | 5 refills | Status: DC

## 2016-05-15 NOTE — Telephone Encounter (Signed)
I have spoken with Rachel Gilbert this morning.  New rx. for Phentermine 37.5mg  tabs, one po qd faxed to [facility].  She will drop the hard copy of the Phentermine caps rx. off with paperwork on Monday and I will destroy it/fim

## 2016-05-15 NOTE — Telephone Encounter (Signed)
Pt said the prescription for the phentermine 37.5 MG capsule  in a capsule for is $20.00 the tablet form for 30 days is $12.05 pt would like to know if she can bring back the prescription  For the capsule and have Dr Epimenio Foot give her one for the tablets.  She said she will be bringing in paperwork on Monday for her MRI she'd like to know if she could get the new prescription  Then and hand back over the other.  Please call

## 2016-05-15 NOTE — Addendum Note (Signed)
Addended by: Candis Schatz I on: 05/15/2016 11:37 AM   Modules accepted: Orders

## 2016-05-16 ENCOUNTER — Encounter (HOSPITAL_COMMUNITY): Payer: Self-pay

## 2016-05-16 ENCOUNTER — Encounter (HOSPITAL_COMMUNITY)
Admission: RE | Admit: 2016-05-16 | Discharge: 2016-05-16 | Disposition: A | Discharge status: Home / Self Care | Payer: Medicare HMO | Source: Ambulatory Visit | Attending: Neurology | Admitting: Neurology

## 2016-05-16 DIAGNOSIS — G35 Multiple sclerosis: Secondary | ICD-10-CM | POA: Insufficient documentation

## 2016-05-16 MED ORDER — SODIUM CHLORIDE 0.9 % IV SOLN
INTRAVENOUS | Status: AC
  Administered 2016-05-16: 13:00:00 via INTRAVENOUS

## 2016-05-16 MED ORDER — SODIUM CHLORIDE 0.9 % IV SOLN
300.0000 mg | INTRAVENOUS | Status: AC
  Administered 2016-05-16: 300 mg via INTRAVENOUS
  Filled 2016-05-16: qty 15

## 2016-05-16 NOTE — Progress Notes (Signed)
Uneventful tysabri infusion today.  Pt stayed 25 min. Post infusion.  Pt knows to call MD with questions/concerns.  Pt walked with cane with friend to lobby.

## 2016-05-16 NOTE — Discharge Instructions (Signed)
Natalizumab injection/Tysabri Infusion  °What is this medicine? °NATALIZUMAB (na ta LIZ you mab) is used to treat relapsing multiple sclerosis. This drug is not a cure. It is also used to treat Crohn's disease. °This medicine may be used for other purposes; ask your health care provider or pharmacist if you have questions. °COMMON BRAND NAME(S): Tysabri °What should I tell my health care provider before I take this medicine? °They need to know if you have any of these conditions: °-immune system problems °-progressive multifocal leukoencephalopathy (PML) °-an unusual or allergic reaction to natalizumab, other medicines, foods, dyes, or preservatives °-pregnant or trying to get pregnant °-breast-feeding °How should I use this medicine? °This medicine is for infusion into a vein. It is given by a health care professional in a hospital or clinic setting. °A special MedGuide will be given to you by the pharmacist with each prescription and refill. Be sure to read this information carefully each time. °Talk to your pediatrician regarding the use of this medicine in children. This medicine is not approved for use in children. °Overdosage: If you think you have taken too much of this medicine contact a poison control center or emergency room at once. °NOTE: This medicine is only for you. Do not share this medicine with others. °What if I miss a dose? °It is important not to miss your dose. Call your doctor or health care professional if you are unable to keep an appointment. °What may interact with this medicine? °-azathioprine °-cyclosporine °-interferon °-6-mercaptopurine °-methotrexate °-steroid medicines like prednisone or cortisone °-TNF-alpha inhibitors like adalimumab, etanercept, and infliximab °-vaccines °This list may not describe all possible interactions. Give your health care provider a list of all the medicines, herbs, non-prescription drugs, or dietary supplements you use. Also tell them if you smoke, drink  alcohol, or use illegal drugs. Some items may interact with your medicine. °What should I watch for while using this medicine? °Your condition will be monitored carefully while you are receiving this medicine. Visit your doctor for regular check ups. Tell your doctor or healthcare professional if your symptoms do not start to get better or if they get worse. °Stay away from people who are sick. Call your doctor or health care professional for advice if you get a fever, chills or sore throat, or other symptoms of a cold or flu. Do not treat yourself. °In some patients, this medicine may cause a serious brain infection that may cause death. If you have any problems seeing, thinking, speaking, walking, or standing, tell your doctor right away. If you cannot reach your doctor, get urgent medical care. °What side effects may I notice from receiving this medicine? °Side effects that you should report to your doctor or health care professional as soon as possible: °-allergic reactions like skin rash, itching or hives, swelling of the face, lips, or tongue °-breathing problems °-changes in vision °-chest pain °-dark urine °-depression, feelings of sadness °-dizziness °-general ill feeling or flu-like symptoms °-irregular, missed, or painful menstrual periods °-light-colored stools °-loss of appetite, nausea °-muscle weakness °-problems with balance, talking, or walking °-right upper belly pain °-unusually weak or tired °-yellowing of the eyes or skin °Side effects that usually do not require medical attention (report to your doctor or health care professional if they continue or are bothersome): °-aches, pains °-headache °-stomach upset °-tiredness °This list may not describe all possible side effects. Call your doctor for medical advice about side effects. You may report side effects to FDA at 1-800-FDA-1088. °Where should   I keep my medicine? °This drug is given in a hospital or clinic and will not be stored at  home. °NOTE: This sheet is a summary. It may not cover all possible information. If you have questions about this medicine, talk to your doctor, pharmacist, or health care provider. °© 2018 Elsevier/Gold Standard (2008-03-04 13:33:21) ° °

## 2016-05-20 ENCOUNTER — Other Ambulatory Visit: Payer: Self-pay

## 2016-05-21 ENCOUNTER — Encounter: Payer: Self-pay | Admitting: *Deleted

## 2016-06-05 ENCOUNTER — Encounter: Payer: Self-pay | Admitting: *Deleted

## 2016-06-05 ENCOUNTER — Telehealth: Payer: Self-pay | Admitting: Neurology

## 2016-06-05 NOTE — Telephone Encounter (Signed)
Pt request lab results °

## 2016-06-05 NOTE — Telephone Encounter (Signed)
I have spoken with Rachel Gilbert and advised that JCV ab drawn 05/07/16 is negative.  She verbalized understanding of same/fim

## 2016-06-20 ENCOUNTER — Encounter (HOSPITAL_COMMUNITY)
Admission: RE | Admit: 2016-06-20 | Discharge: 2016-06-20 | Disposition: A | Discharge status: Home / Self Care | Payer: Medicare HMO | Source: Ambulatory Visit | Attending: Neurology | Admitting: Neurology

## 2016-06-20 ENCOUNTER — Encounter (HOSPITAL_COMMUNITY): Payer: Self-pay

## 2016-06-20 DIAGNOSIS — G35 Multiple sclerosis: Secondary | ICD-10-CM | POA: Diagnosis present

## 2016-06-20 MED ORDER — SODIUM CHLORIDE 0.9 % IV SOLN
INTRAVENOUS | Status: DC
Start: 2016-06-20 — End: 2016-06-21
  Administered 2016-06-20: 12:00:00 via INTRAVENOUS

## 2016-06-20 MED ORDER — SODIUM CHLORIDE 0.9 % IV SOLN
300.0000 mg | INTRAVENOUS | Status: DC
  Administered 2016-06-20: 300 mg via INTRAVENOUS
  Filled 2016-06-20: qty 15

## 2016-06-20 NOTE — Progress Notes (Signed)
Post-infusion of Tysabri, pt. Stayed 15 minutes, pt. To follow-up with doctor with any problems.

## 2016-06-20 NOTE — Discharge Instructions (Signed)
°Tysabri °Natalizumab injection °What is this medicine? °NATALIZUMAB (na ta LIZ you mab) is used to treat relapsing multiple sclerosis. This drug is not a cure. It is also used to treat Crohn's disease. °This medicine may be used for other purposes; ask your health care provider or pharmacist if you have questions. °COMMON BRAND NAME(S): Tysabri °What should I tell my health care provider before I take this medicine? °They need to know if you have any of these conditions: °-immune system problems °-progressive multifocal leukoencephalopathy (PML) °-an unusual or allergic reaction to natalizumab, other medicines, foods, dyes, or preservatives °-pregnant or trying to get pregnant °-breast-feeding °How should I use this medicine? °This medicine is for infusion into a vein. It is given by a health care professional in a hospital or clinic setting. °A special MedGuide will be given to you by the pharmacist with each prescription and refill. Be sure to read this information carefully each time. °Talk to your pediatrician regarding the use of this medicine in children. This medicine is not approved for use in children. °Overdosage: If you think you have taken too much of this medicine contact a poison control center or emergency room at once. °NOTE: This medicine is only for you. Do not share this medicine with others. °What if I miss a dose? °It is important not to miss your dose. Call your doctor or health care professional if you are unable to keep an appointment. °What may interact with this medicine? °-azathioprine °-cyclosporine °-interferon °-6-mercaptopurine °-methotrexate °-steroid medicines like prednisone or cortisone °-TNF-alpha inhibitors like adalimumab, etanercept, and infliximab °-vaccines °This list may not describe all possible interactions. Give your health care provider a list of all the medicines, herbs, non-prescription drugs, or dietary supplements you use. Also tell them if you smoke, drink  alcohol, or use illegal drugs. Some items may interact with your medicine. °What should I watch for while using this medicine? °Your condition will be monitored carefully while you are receiving this medicine. Visit your doctor for regular check ups. Tell your doctor or healthcare professional if your symptoms do not start to get better or if they get worse. °Stay away from people who are sick. Call your doctor or health care professional for advice if you get a fever, chills or sore throat, or other symptoms of a cold or flu. Do not treat yourself. °In some patients, this medicine may cause a serious brain infection that may cause death. If you have any problems seeing, thinking, speaking, walking, or standing, tell your doctor right away. If you cannot reach your doctor, get urgent medical care. °What side effects may I notice from receiving this medicine? °Side effects that you should report to your doctor or health care professional as soon as possible: °-allergic reactions like skin rash, itching or hives, swelling of the face, lips, or tongue °-breathing problems °-changes in vision °-chest pain °-dark urine °-depression, feelings of sadness °-dizziness °-general ill feeling or flu-like symptoms °-irregular, missed, or painful menstrual periods °-light-colored stools °-loss of appetite, nausea °-muscle weakness °-problems with balance, talking, or walking °-right upper belly pain °-unusually weak or tired °-yellowing of the eyes or skin °Side effects that usually do not require medical attention (report to your doctor or health care professional if they continue or are bothersome): °-aches, pains °-headache °-stomach upset °-tiredness °This list may not describe all possible side effects. Call your doctor for medical advice about side effects. You may report side effects to FDA at 1-800-FDA-1088. °Where should I   keep my medicine? °This drug is given in a hospital or clinic and will not be stored at  home. °NOTE: This sheet is a summary. It may not cover all possible information. If you have questions about this medicine, talk to your doctor, pharmacist, or health care provider. °© 2018 Elsevier/Gold Standard (2008-03-04 13:33:21) ° °

## 2016-07-02 ENCOUNTER — Ambulatory Visit
Admission: RE | Admit: 2016-07-02 | Discharge: 2016-07-02 | Disposition: A | Discharge status: Home / Self Care | Payer: Medicare HMO | Source: Ambulatory Visit | Attending: Neurology | Admitting: Neurology

## 2016-07-02 DIAGNOSIS — G35 Multiple sclerosis: Secondary | ICD-10-CM | POA: Diagnosis not present

## 2016-07-02 DIAGNOSIS — R269 Unspecified abnormalities of gait and mobility: Secondary | ICD-10-CM

## 2016-07-02 MED ORDER — GADOBENATE DIMEGLUMINE 529 MG/ML IV SOLN
20.0000 mL | Freq: Once | INTRAVENOUS | Status: AC | PRN
  Administered 2016-07-02: 20 mL via INTRAVENOUS

## 2016-07-07 ENCOUNTER — Telehealth: Payer: Self-pay | Admitting: *Deleted

## 2016-07-07 NOTE — Telephone Encounter (Signed)
-----   Message from Asa Lente, MD sent at 07/04/2016  1:51 PM EDT ----- Please let her know that the MRI of the brain is unchanged from her previous one

## 2016-07-07 NOTE — Telephone Encounter (Signed)
I have spoken with Rachel Gilbert this morning and per RAS, explained MRI brain shows no new changes when compared to previous MRI.  She verbalized understanding of same/fim

## 2016-07-18 ENCOUNTER — Encounter (HOSPITAL_COMMUNITY): Payer: Self-pay

## 2016-07-18 ENCOUNTER — Encounter (HOSPITAL_COMMUNITY)
Admission: RE | Admit: 2016-07-18 | Discharge: 2016-07-18 | Disposition: A | Discharge status: Home / Self Care | Payer: Medicare HMO | Source: Ambulatory Visit | Attending: Neurology | Admitting: Neurology

## 2016-07-18 DIAGNOSIS — G35 Multiple sclerosis: Secondary | ICD-10-CM | POA: Diagnosis present

## 2016-07-18 MED ORDER — SODIUM CHLORIDE 0.9 % IV SOLN
300.0000 mg | INTRAVENOUS | Status: DC
  Administered 2016-07-18: 300 mg via INTRAVENOUS
  Filled 2016-07-18: qty 15

## 2016-07-18 MED ORDER — SODIUM CHLORIDE 0.9 % IV SOLN
INTRAVENOUS | Status: DC
  Administered 2016-07-18: 13:00:00 via INTRAVENOUS

## 2016-07-18 NOTE — Progress Notes (Signed)
Uneventful tysabri infusion.  Pt stayed 30 min.post infusion.  Pt will follow up with MD if any questions/concerns.

## 2016-07-23 ENCOUNTER — Encounter: Payer: Self-pay | Admitting: *Deleted

## 2016-08-15 ENCOUNTER — Encounter (HOSPITAL_COMMUNITY)
Admission: RE | Admit: 2016-08-15 | Discharge: 2016-08-15 | Disposition: A | Discharge status: Home / Self Care | Payer: Medicare HMO | Source: Ambulatory Visit | Attending: Neurology | Admitting: Neurology

## 2016-08-15 ENCOUNTER — Encounter (HOSPITAL_COMMUNITY): Payer: Self-pay

## 2016-08-15 DIAGNOSIS — G35 Multiple sclerosis: Secondary | ICD-10-CM | POA: Diagnosis not present

## 2016-08-15 MED ORDER — SODIUM CHLORIDE 0.9 % IV SOLN
300.0000 mg | INTRAVENOUS | Status: DC
  Administered 2016-08-15: 300 mg via INTRAVENOUS
  Filled 2016-08-15: qty 15

## 2016-08-15 MED ORDER — SODIUM CHLORIDE 0.9 % IV SOLN
INTRAVENOUS | Status: DC
  Administered 2016-08-15: 11:00:00 via INTRAVENOUS

## 2016-08-15 NOTE — Discharge Instructions (Signed)

## 2016-09-12 ENCOUNTER — Encounter (HOSPITAL_COMMUNITY): Payer: Medicare HMO

## 2016-09-15 ENCOUNTER — Encounter (HOSPITAL_COMMUNITY)
Admission: RE | Admit: 2016-09-15 | Discharge: 2016-09-15 | Disposition: A | Discharge status: Home / Self Care | Payer: Medicare HMO | Source: Ambulatory Visit | Attending: Neurology | Admitting: Neurology

## 2016-09-15 ENCOUNTER — Encounter (HOSPITAL_COMMUNITY): Payer: Self-pay

## 2016-09-15 DIAGNOSIS — G35 Multiple sclerosis: Secondary | ICD-10-CM | POA: Insufficient documentation

## 2016-09-15 MED ORDER — SODIUM CHLORIDE 0.9 % IV SOLN
INTRAVENOUS | Status: DC
  Administered 2016-09-15: 10:00:00 via INTRAVENOUS

## 2016-09-15 MED ORDER — SODIUM CHLORIDE 0.9 % IV SOLN
300.0000 mg | INTRAVENOUS | Status: DC
  Administered 2016-09-15: 300 mg via INTRAVENOUS
  Filled 2016-09-15: qty 15

## 2016-09-15 NOTE — Progress Notes (Signed)
Post-infusion of tysabri, pt. Stayed 20 minutes, pt. To follow-up with doctor with any problems.

## 2016-10-10 ENCOUNTER — Encounter (HOSPITAL_COMMUNITY): Payer: Medicare HMO

## 2016-10-13 ENCOUNTER — Encounter (HOSPITAL_COMMUNITY)
Admission: RE | Admit: 2016-10-13 | Discharge: 2016-10-13 | Disposition: A | Discharge status: Home / Self Care | Payer: Medicare HMO | Source: Ambulatory Visit | Attending: Neurology | Admitting: Neurology

## 2016-10-13 ENCOUNTER — Encounter (HOSPITAL_COMMUNITY): Payer: Self-pay

## 2016-10-13 DIAGNOSIS — G35 Multiple sclerosis: Secondary | ICD-10-CM | POA: Insufficient documentation

## 2016-10-13 MED ORDER — SODIUM CHLORIDE 0.9 % IV SOLN
INTRAVENOUS | Status: DC
  Administered 2016-10-13: 10:00:00 via INTRAVENOUS

## 2016-10-13 MED ORDER — SODIUM CHLORIDE 0.9 % IV SOLN
300.0000 mg | INTRAVENOUS | Status: DC
  Administered 2016-10-13: 300 mg via INTRAVENOUS
  Filled 2016-10-13: qty 15

## 2016-10-13 NOTE — Progress Notes (Signed)
Patient stayed 15 min post tysabri infusion. Up to restroom. VSS. Aware to call MD for any problems or questions. Patient aware recommended time one hour for observation afterwards but stated "it's my 26th infusion I don't need to stay".

## 2016-10-13 NOTE — Discharge Instructions (Signed)

## 2016-10-17 ENCOUNTER — Telehealth: Payer: Self-pay | Admitting: Neurology

## 2016-10-17 NOTE — Telephone Encounter (Signed)
I have spoken with Rachel Gilbert this morning.  She needs a specific letter signed by Dr. Epimenio Foot, verifying that she has MS.  She will drop it by our office next week--Dr. Epimenio Foot will sign it and I will fax it in for her./fim

## 2016-10-17 NOTE — Telephone Encounter (Signed)
Pt is working with a foundation to build a ramp and she needs a letter of verification/form signed by Dr Epimenio Foot verifying that she has MS.  Pt states that she will bring it Monday and just wants to speak with RN Faith about it to give her a heads up about it when she gets it on Monday, please call

## 2016-11-06 ENCOUNTER — Encounter: Payer: Self-pay | Admitting: Neurology

## 2016-11-06 ENCOUNTER — Encounter: Payer: Self-pay | Admitting: *Deleted

## 2016-11-06 ENCOUNTER — Ambulatory Visit (INDEPENDENT_AMBULATORY_CARE_PROVIDER_SITE_OTHER): Payer: Medicare HMO | Admitting: Neurology

## 2016-11-06 VITALS — BP 130/74 | HR 76 | Resp 20 | Ht 69.0 in | Wt 286.5 lb

## 2016-11-06 DIAGNOSIS — G35 Multiple sclerosis: Secondary | ICD-10-CM | POA: Diagnosis not present

## 2016-11-06 DIAGNOSIS — Z9989 Dependence on other enabling machines and devices: Secondary | ICD-10-CM | POA: Diagnosis not present

## 2016-11-06 DIAGNOSIS — G4733 Obstructive sleep apnea (adult) (pediatric): Secondary | ICD-10-CM

## 2016-11-06 DIAGNOSIS — R41840 Attention and concentration deficit: Secondary | ICD-10-CM | POA: Diagnosis not present

## 2016-11-06 DIAGNOSIS — R5383 Other fatigue: Secondary | ICD-10-CM | POA: Diagnosis not present

## 2016-11-06 DIAGNOSIS — Z79899 Other long term (current) drug therapy: Secondary | ICD-10-CM

## 2016-11-06 DIAGNOSIS — E6609 Other obesity due to excess calories: Secondary | ICD-10-CM

## 2016-11-06 DIAGNOSIS — R350 Frequency of micturition: Secondary | ICD-10-CM

## 2016-11-06 DIAGNOSIS — R269 Unspecified abnormalities of gait and mobility: Secondary | ICD-10-CM | POA: Diagnosis not present

## 2016-11-06 MED ORDER — METHYLPHENIDATE HCL 10 MG PO TABS: ORAL_TABLET | ORAL | 0 refills | Status: DC

## 2016-11-06 NOTE — Progress Notes (Signed)
[facility]  PATIENT: Rachel Gilbert DOB: Mar 19, 1960  REFERRING DOCTOR OR PCP:  ER  No PCP SOURCE: ER notes, images on PACS, patient  _________________________________   HISTORICAL  CHIEF COMPLAINT:  Chief Complaint  Patient presents with  . Multiple Sclerosis    Sts. she continues to tolerate Tysabri well.  JCV ab last checked 05/07/16 and was negative at 0.15./fim    HISTORY OF PRESENT ILLNESS:  Rachel Gilbert is 56 y.o. woman with MS and OSA.    Update 11/06/2016:    She feels her MS is doing well. She is on Tysabri. She tolerates it well and has not had any definite exacerbations. Her last JCV antibody (05/07/2016) was negative at 0.15.  She denies any new symptoms.   Her gait is the same but she notes more left calf cramps.   Gait is wore on an uneven surface and climbing a few steps can be difficult.   She did have one fall when she bent down to pick up an ice cube that fell.   She did not hurt her head.     Her arms feels strong but the legs feels heavy (R=L) .  She denies numbness.    She notes frequency and urgency and rare urge incontinence.    Vision is doing well.   Fatigue and sleepiness persist to some extent, even with CPAP.  She feels a second wind in the late mornings .  She stopped phentermine because it did not help weight loss and did not help focus too much.  Many years ago she was o Ritalin with benefit for focus and attention, fatigue and weight loss..    She has severe OSA and uses CPAP nightly (+8 cm)                                                                                                                                                                                       _________________________________________ From 05/07/2016:  MS:   She has been on Tysabri since 05/2014.   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   We discussed the risks and benefits of Tysabri (about 1:2000 risk of  PML assuming she fluctuates from low positive or negative)   OSA:   She has severe OSA. She is currently on CPAP +8 cm. She tolerates it well. I looked at the download from the last 53 days. It showed 100% compliance and a very good AHI equals 3.7. However, there was more than expected leak with 21.3 L/m.   The sleep study 2009 showed an AHI equals 38  with oxygen desaturations to 76%.   She notes that her mask sometimes leaks. She does not think that she snores through the mask.   She uses a FF mask and notes breathing through her mouth  Gait/strength/sensation/coordination:   Gait is slightly better.   Her new home does not have stairs and she does not tire out as much getting about.   Ampyra helped but the co-pay was very high at $400 and she cannot afford that. She does not think that she could afford the compounded drug (which is usually about $60 a month).    Balance is poor affecting gait.    She often uses a cane for balance.    Her legs are both mildly weak.   She has most trouble with changes in surface or with curbs.   . She denies weakness in the arms. She denies any numbness in the arms or legs. She has mild leg clumsiness. Ampyra has helped her gait and she has been on x 2 years.       Bladder/bowel: She has urinary urgency, frequency and nocturia (twice a night). Ditropan XL 10 mg has helped but she just takes now and then since starting the new CPAP machine.   At most she has one x nocturia.     Vision: She feels her vision is fine.  Fatigue/sleep: She has physical and mental fatigue.  Fatigue is better on Tysabri than previous treatments.  She feels less fstigue and sleepiness with her CPAP machine.   Ampyra helps her gait endurance as she is able to walk a little further with it.  Phentermine helps as much as Adderall but makes her feel hot at times.    She has insomnia, both sleep onset and sleep maintenance.   Mood/Cognition:   She still has some crying spells and sometimes  inappropriate crying.    She could not afford Nuedexta.    Her boyfriend died in a motorcycle accident last year.    She has done grief counseling.   She feels her cognitive fog is reduced since being on Tysabri and with stimulants.       MS history: She was diagnosed in September 2012 after presenting with diplopia. She was hospitalized and received 5 days of IV steroids and she had MRI and lumbar puncture. She recalls that the lumbar puncture was not consistent with MS but the MRI was.  She was placed on Gilenya in December 2012. She was able to return to work and the diplopia completely resolved. Then in July 2014 she had another exacerbation with visual disturbance bilaterally, gait changes and cognitive changes.  Reportedly, there was a new lesion in the brainstem and also in the spinal cord at that time. She received 5 more days of steroid.    In early May 2016, she had the onset of several symptoms including worse cognitive problems, worse gait problems and weakness in the hands.   She went to the [facility] emergency room on 06/03/2014 and she was evaluated. An admitted and received 3 days of IV Solu-Medrol.The MRI of the brain shows many white matter lesions. Some are periventricular with an appearance consistent with MS. She also has deep white matter subcortical and juxtacortical foci. Some of these look more consistent with small vessel ischemic changes and most more consistent with multiple sclerosis. She does have new foci in the left vermis of the cerebellum and in the left frontal periventricular region.[name]    REVIEW OF SYSTEMS: Constitutional:  No fevers, chills, sweats, or change in appetite.   She has fatigue Eyes: No visual changes, double vision, eye pain Ear, nose and throat: No hearing loss, ear pain, nasal congestion, sore throat Cardiovascular: No chest pain, palpitations Respiratory: No shortness of breath at rest or with exertion.   No wheezes GastrointestinaI: No nausea,  vomiting, diarrhea, abdominal pain, fecal incontinence Genitourinary: No dysuria, urinary retention.   Frequency better on medications. Musculoskeletal: No neck pain, back pain Integumentary: No rash, pruritus, skin lesions Neurological: as above Psychiatric: No depression at this time.  No anxiety Endocrine: No palpitations, diaphoresis, change in appetite, change in weigh or increased thirst Hematologic/Lymphatic: No anemia, purpura, petechiae. Allergic/Immunologic: No itchy/runny eyes, nasal congestion, recent allergic reactions, rashes  ALLERGIES: Allergies  Allergen Reactions  . Oxycodone Anaphylaxis    HOME MEDICATIONS:  Current Outpatient Prescriptions:  .  acetaminophen (TYLENOL) 500 MG tablet, Take 500 mg by mouth every 6 (six) hours as needed for mild pain., Disp: , Rfl:  .  ALPRAZolam (XANAX) 0.5 MG tablet, Take 0.5 mg by mouth 2 (two) times daily as needed for anxiety., Disp: , Rfl:  .  baclofen (LIORESAL) 10 MG tablet, Take 1 tablet (10 mg total) by mouth 3 (three) times daily as needed for muscle spasms., Disp: 90 each, Rfl: 3 .  carvedilol (COREG) 25 MG tablet, Take 25 mg by mouth 2 (two) times daily with a meal., Disp: , Rfl:  .  cholecalciferol (VITAMIN D) 1000 units tablet, Take 5,000 Units by mouth daily., Disp: , Rfl:  .  doxazosin (CARDURA) 4 MG tablet, Take 4 mg by mouth daily. Reported on 06/06/2015, Disp: , Rfl:  .  FLUoxetine (PROZAC) 40 MG capsule, Take 1 capsule (40 mg total) by mouth daily., Disp: 30 capsule, Rfl: 3 .  hydrALAZINE (APRESOLINE) 50 MG tablet, Take 50 mg by mouth 2 (two) times daily., Disp: , Rfl:  .  ibuprofen (ADVIL,MOTRIN) 200 MG tablet, Take 200 mg by mouth every 6 (six) hours as needed for moderate pain., Disp: , Rfl:  .  lisinopril (PRINIVIL,ZESTRIL) 40 MG tablet, Take 40 mg by mouth daily., Disp: , Rfl:  .  natalizumab (TYSABRI) 300 MG/15ML injection, Inject into the vein., Disp: , Rfl:  .  ondansetron (ZOFRAN) 4 MG tablet, Take 4 mg by  mouth every 8 (eight) hours as needed for nausea or vomiting., Disp: , Rfl:  .  oxybutynin (DITROPAN XL) 15 MG 24 hr tablet, Take 1 tablet (15 mg total) by mouth at bedtime., Disp: 90 tablet, Rfl: 3 .  ranitidine (ZANTAC) 300 MG capsule, Take 300 mg by mouth every evening., Disp: , Rfl:  .  spironolactone (ALDACTONE) 50 MG tablet, Take 50 mg by mouth daily., Disp: , Rfl:  .  sulfamethoxazole-trimethoprim (BACTRIM DS,SEPTRA DS) 800-160 MG tablet, Take 1 tablet by mouth 2 (two) times daily., Disp: 14 tablet, Rfl: 0 .  traZODone (DESYREL) 100 MG tablet, Take 1 tablet (100 mg total) by mouth at bedtime., Disp: 30 tablet, Rfl: 11 .  Vitamin D, Ergocalciferol, (DRISDOL) 50000 units CAPS capsule, Take 50,000 Units by mouth every 7 (seven) days., Disp: , Rfl:  .  methylphenidate (RITALIN) 10 MG tablet, Take 2 pills qAM and one pill po 4 hours later, Disp: 90 tablet, Rfl: 0  PAST MEDICAL HISTORY: Past Medical History:  Diagnosis Date  . Complication of anesthesia    hard time waking up   . Hearing loss   . Hypertension   . Kidney stones   .  Multiple sclerosis (HCC)   . Sleep apnea    Uses CPAP  . Vision abnormalities     PAST SURGICAL HISTORY: Past Surgical History:  Procedure Laterality Date  . CESAREAN SECTION  1986  . CHOLECYSTECTOMY  2000   laparoscopic    FAMILY HISTORY: Family History  Problem Relation Age of Onset  . Fibromyalgia Mother   . Stroke Father   . Diabetes Father   . Stroke Other     SOCIAL HISTORY:  Social History   Social History  . Marital status: Single    Spouse name: N/A  . Number of children: N/A  . Years of education: N/A   Occupational History  . Not on file.   Social History Main Topics  . Smoking status: Never Smoker  . Smokeless tobacco: Never Used  . Alcohol use Yes     Comment: Occasional  . Drug use: No  . Sexual activity: Not on file   Other Topics Concern  . Not on file   Social History Narrative  . No narrative on file      PHYSICAL EXAM  Vitals:   11/06/16 1116  BP: 130/74  Pulse: 76  Resp: 20  Weight: 286 lb 8 oz (130 kg)  Height:  (1.753 m)    Body mass index is 42.31 kg/m.   General: The patient is well-developed and well-nourished and in no acute distress  Neurologic Exam  Mental status: The patient is alert and oriented x 3 at the time of the examination.  Affect is good today.  .  The patient has apparent normal recent and remote memory but decreased attention span and concentration ability.   Speech is normal.  Cranial nerves: Extraocular movements are full. Facial strength and sensation is normal.. treapezius and sternocleidomastoid strength is normal. No dysarthria is noted.  The tongue is midline, and the patient has  elevation of the soft palate.   Motor:  Muscle bulk is normal.   Tone is normal. Strength is  5 / 5 in all 4 extremities.   Sensory: Sensory testing is intact to pinprick and vibration sensation in all 4 extremities.  Coordination: Cerebellar testing reveals good finger-nose-finger and reduced left heel-to-shin .  Gait and station: Station is normal.   Gait is wide.  She is unable to do a tandem walk without holding on.   Reflexes: Deep tendon reflexes are symmetric and slightly increased in legs.       DIAGNOSTIC DATA (LABS, IMAGING, TESTING) - I reviewed patient records, labs, notes, testing and imaging myself where available.  Lab Results  Component Value Date   WBC 11.5 (H) 05/07/2016   HGB 14.7 05/07/2016   HCT 43.7 05/07/2016   MCV 87 05/07/2016   PLT 331 05/07/2016      Component Value Date/Time   NA 144 02/12/2015 1139   K 4.2 02/12/2015 1139   CL 105 02/12/2015 1139   CO2 20 02/12/2015 1139   GLUCOSE 97 02/12/2015 1139   GLUCOSE 133 (H) 06/07/2014 0601   BUN 15 02/12/2015 1139   CREATININE 1.13 (H) 02/12/2015 1139   CALCIUM 9.5 02/12/2015 1139   PROT 6.1 11/16/2014 1047   ALBUMIN 4.5 11/16/2014 1047   AST 14 11/16/2014 1047   ALT  16 11/16/2014 1047   ALKPHOS 60 11/16/2014 1047   BILITOT 1.2 11/16/2014 1047   GFRNONAA 56 (L) 02/12/2015 1139   GFRAA 64 02/12/2015 1139      ASSESSMENT AND PLAN  Multiple sclerosis (  HCC) - Plan: Stratify JCV Antibody Test ([facility]), CBC with Differential/Platelet, CBC with Differential/Platelet, Stratify JCV Antibody Test ([facility]), TSH  High risk medication use - Plan: Stratify JCV Antibody Test ([facility]), CBC with Differential/Platelet, CBC with Differential/Platelet, Stratify JCV Antibody Test ([facility]), TSH  Other fatigue  Obstructive sleep apnea on CPAP  Obesity due to excess calories without serious comorbidity, unspecified classification  Gait disturbance  Urinary frequency  Attention deficit   1.     She will continue Tysabri. We will check a JCV antibody and CBC today. If she does convert to mid or high positive we will need to reconsider a different disease modifying therapy.  2.    Ritalin for attention deficit, fatigue and weight loss.  3.   She cannot afford Ampyra no longer gets patient assistance. I believe that it'll be available as a generic soon and hopefully her next visit we will be able to get her back on it.  4.   She will return in 6 months for f/u with me or call sooner if problems   Richard A. Epimenio Foot, MD, PhD 11/06/2016, 11:56 AM Certified in Neurology, Clinical Neurophysiology, Sleep Medicine, Pain Medicine and Neuroimaging  [facility] [address], [address] [place], [place] [address] [phone]                          8

## 2016-11-07 ENCOUNTER — Encounter (HOSPITAL_COMMUNITY): Payer: Medicare HMO

## 2016-11-07 LAB — CBC WITH DIFFERENTIAL/PLATELET
BASOS ABS: 0 10*3/uL (ref 0.0–0.2)
Basos: 0 %
EOS (ABSOLUTE): 0.3 10*3/uL (ref 0.0–0.4)
Eos: 3 %
HEMOGLOBIN: 13.9 g/dL (ref 11.1–15.9)
Hematocrit: 43.8 % (ref 34.0–46.6)
IMMATURE GRANS (ABS): 0.1 10*3/uL (ref 0.0–0.1)
Immature Granulocytes: 1 %
LYMPHS: 27 %
Lymphocytes Absolute: 3 10*3/uL (ref 0.7–3.1)
MCH: 28.3 pg (ref 26.6–33.0)
MCHC: 31.7 g/dL (ref 31.5–35.7)
MCV: 89 fL (ref 79–97)
MONOCYTES: 7 %
Monocytes Absolute: 0.7 10*3/uL (ref 0.1–0.9)
Neutrophils Absolute: 6.9 10*3/uL (ref 1.4–7.0)
Neutrophils: 62 %
Platelets: 277 10*3/uL (ref 150–379)
RBC: 4.91 x10E6/uL (ref 3.77–5.28)
RDW: 15.5 % — ABNORMAL HIGH (ref 12.3–15.4)
WBC: 11.1 10*3/uL — AB (ref 3.4–10.8)

## 2016-11-07 LAB — TSH: TSH: 1.13 u[IU]/mL (ref 0.450–4.500)

## 2016-11-13 ENCOUNTER — Encounter: Payer: Self-pay | Admitting: *Deleted

## 2016-11-14 ENCOUNTER — Encounter (HOSPITAL_COMMUNITY)
Admission: RE | Admit: 2016-11-14 | Discharge: 2016-11-14 | Disposition: A | Discharge status: Home / Self Care | Payer: Medicare HMO | Source: Ambulatory Visit | Attending: Neurology | Admitting: Neurology

## 2016-11-14 ENCOUNTER — Encounter (HOSPITAL_COMMUNITY): Payer: Self-pay

## 2016-11-14 DIAGNOSIS — G35 Multiple sclerosis: Secondary | ICD-10-CM | POA: Diagnosis not present

## 2016-11-14 MED ORDER — SODIUM CHLORIDE 0.9 % IV SOLN
300.0000 mg | INTRAVENOUS | Status: AC
  Administered 2016-11-14: 300 mg via INTRAVENOUS
  Filled 2016-11-14: qty 15

## 2016-11-14 MED ORDER — SODIUM CHLORIDE 0.9 % IV SOLN
INTRAVENOUS | Status: AC
  Administered 2016-11-14: 11:00:00 via INTRAVENOUS

## 2016-11-21 ENCOUNTER — Encounter: Payer: Self-pay | Admitting: *Deleted

## 2016-11-21 ENCOUNTER — Telehealth: Payer: Self-pay | Admitting: Neurology

## 2016-11-21 NOTE — Telephone Encounter (Signed)
Pt has been [employer] for [employer].  She is asking for a note for the [employer] re: her hearing issue, her cognitive state and inability to sit for a long period of time due to MS.  Please call

## 2016-11-21 NOTE — Telephone Encounter (Signed)
Spoke with [name].  She is concerned about serving as a juror due to hearing loss and pseudobulbar affect/frequent crying. Letter for jury duty awaiting RAS sig, then will mail to pt's home address per her request/fim

## 2016-11-24 NOTE — Telephone Encounter (Signed)
Letter for jury duty mailed to pt./fim

## 2016-12-05 ENCOUNTER — Encounter (HOSPITAL_COMMUNITY): Payer: Medicare HMO

## 2016-12-12 ENCOUNTER — Encounter (HOSPITAL_COMMUNITY)
Admission: RE | Admit: 2016-12-12 | Discharge: 2016-12-12 | Disposition: A | Discharge status: Home / Self Care | Payer: Medicare HMO | Source: Ambulatory Visit | Attending: Neurology | Admitting: Neurology

## 2016-12-12 ENCOUNTER — Encounter (HOSPITAL_COMMUNITY): Payer: Self-pay

## 2016-12-12 DIAGNOSIS — G35 Multiple sclerosis: Secondary | ICD-10-CM | POA: Insufficient documentation

## 2016-12-12 MED ORDER — SODIUM CHLORIDE 0.9 % IV SOLN
INTRAVENOUS | Status: DC
  Administered 2016-12-12: 11:00:00 via INTRAVENOUS

## 2016-12-12 MED ORDER — SODIUM CHLORIDE 0.9 % IV SOLN
300.0000 mg | INTRAVENOUS | Status: DC
  Administered 2016-12-12: 300 mg via INTRAVENOUS
  Filled 2016-12-12: qty 15

## 2016-12-12 NOTE — Progress Notes (Signed)
Patient's bp elevated today upon arrival for Tysabri infusion. Stated her medical MD is aware of this and is currently adjusting her bp meds. Patient informed RN this am that she is planning to purchase a bp cuff to monitor bp at home to keep medical MD informed. Patient has bp from today 149/99 , 155/99, 155/94 written down and she will call her medical doctor this afternoon to inform her (in addition to purchasing cuff to monitor it herself). Will be d/c to home with friend. Patient declined to stay for full one hour observation post Tysabri infusion (stayed 15 min post). Aware to call MD for problems/questions.

## 2016-12-30 ENCOUNTER — Encounter: Payer: Self-pay | Admitting: *Deleted

## 2017-01-02 ENCOUNTER — Encounter (HOSPITAL_COMMUNITY): Payer: Medicare HMO

## 2017-01-07 ENCOUNTER — Telehealth: Payer: Self-pay | Admitting: Neurology

## 2017-01-07 NOTE — Telephone Encounter (Signed)
It is okay for her to get the infusions as long as she has been fever free for 24 hours or more

## 2017-01-07 NOTE — Telephone Encounter (Signed)
Spoke with Rachel Gilbert this am.  She is recovering from a URI.  Has been on antibiotics, feeling much better, no fever.  Advised as long as she continues to be fever free, ok for Ty infusion on Friday/fim

## 2017-01-07 NOTE — Telephone Encounter (Signed)
Pt calling to inform she is scheduled for an infusion @ [facility] on Friday the 14th.  Pt calling to inform she is on antibiotics and will not be finished with them until Tues the 18th( amoxicillian and prednisone) she wants to know if she will still be able to have her infusion still. Please call

## 2017-01-09 ENCOUNTER — Encounter (HOSPITAL_COMMUNITY): Payer: Self-pay

## 2017-01-09 ENCOUNTER — Encounter (HOSPITAL_COMMUNITY)
Admission: RE | Admit: 2017-01-09 | Discharge: 2017-01-09 | Disposition: A | Discharge status: Home / Self Care | Payer: Medicare HMO | Source: Ambulatory Visit | Attending: Neurology | Admitting: Neurology

## 2017-01-09 DIAGNOSIS — G35 Multiple sclerosis: Secondary | ICD-10-CM | POA: Insufficient documentation

## 2017-01-09 MED ORDER — SODIUM CHLORIDE 0.9 % IV SOLN
300.0000 mg | INTRAVENOUS | Status: DC
  Administered 2017-01-09: 300 mg via INTRAVENOUS
  Filled 2017-01-09: qty 15

## 2017-01-09 MED ORDER — SODIUM CHLORIDE 0.9 % IV SOLN
INTRAVENOUS | Status: DC
  Administered 2017-01-09: 10:00:00 via INTRAVENOUS

## 2017-01-09 NOTE — Progress Notes (Signed)
Post-infusion Tysabri, no problems during infusion, patient  To follow-up with doctor with any problems,patient stayed 15 minutes- post-infusion.

## 2017-01-09 NOTE — Progress Notes (Signed)
Per previous notes, patient has been fever free greater than 24 hours, 3 days of antibiotics left. Tysabri infusion started.

## 2017-01-09 NOTE — Discharge Instructions (Signed)

## 2017-01-29 ENCOUNTER — Telehealth: Payer: Self-pay | Admitting: Neurology

## 2017-01-29 NOTE — Telephone Encounter (Signed)
LMOM (identified vm) for Rachel Gilbert to call/fim

## 2017-01-29 NOTE — Telephone Encounter (Signed)
Marcelino Duster with [facility] is calling needing to know more information about what injections is needed. Contact at [phone] to discuss more about pt coming up appointment

## 2017-02-03 NOTE — Telephone Encounter (Signed)
Spoke with Marcelino Duster at [facility].  She sts. PA may be needed for Tysabri.  She will reach out to the pt. to initiate this, then have pa form faxed to me/fim

## 2017-02-09 ENCOUNTER — Telehealth: Payer: Self-pay | Admitting: Neurology

## 2017-02-09 NOTE — Telephone Encounter (Signed)
Noted/fim 

## 2017-02-09 NOTE — Telephone Encounter (Signed)
Advised patient of previous message. °

## 2017-02-09 NOTE — Telephone Encounter (Signed)
Patient calling regarding PA for infusion forTysabri. I advised of previous message but she spoke with insurance company last Friday and this morning and they said have not heard from our office. She has an appointment for an infusion at [facility] this Friday.

## 2017-02-09 NOTE — Telephone Encounter (Signed)
LMOM (identified vm) that I have spoken with [address] Medicare Dois [place] E).  and given info for PA, for buy and bill Tysabri at [facility] short stay. Awaiting a decision. She does not need to return this call unless she has questions/fim

## 2017-02-09 NOTE — Telephone Encounter (Signed)
Patient requesting refill of methylphenidate (RITALIN) 10 MG tablet. ° ° °

## 2017-02-09 NOTE — Telephone Encounter (Signed)
Spoke with [name] this morning. Per Marcelino Duster at [facility], she was going to speak with pt. and initiate a PA if needed.  I lmom for Marcelino Duster, as pt. sts. nobody at [facility] reached out to her to do this.  [employer] Medicare ID# is [id number].  Phone # for pharmacy is [phone]/fim

## 2017-02-10 MED ORDER — METHYLPHENIDATE HCL 10 MG PO TABS: ORAL_TABLET | ORAL | 0 refills | Status: DC

## 2017-02-10 NOTE — Telephone Encounter (Signed)
Ritalin rx. up front [facility]/fim 

## 2017-02-10 NOTE — Telephone Encounter (Signed)
Rx. awaiting RAS sig/fim 

## 2017-02-10 NOTE — Addendum Note (Signed)
Addended by: Candis Schatz I on: 02/10/2017 08:36 AM   Modules accepted: Orders

## 2017-02-12 NOTE — Telephone Encounter (Signed)
Rachel Gilbert is needing a call to from RN to discuss a few more things, please contact at [phone]

## 2017-02-12 NOTE — Telephone Encounter (Signed)
I have spoken with Jeanice Lim, nurse reviewer, at [facility], and completed urgent phone PA ([address] [place]) for Tysabri infusion.  PA approved, for buy and bill med only.  Approval # [id number], for dates 02/09/17 thru 08/09/17/fim

## 2017-02-12 NOTE — Telephone Encounter (Signed)
I have spoken with [name] and explained Rachel Gilbert is approved thru 08/09/17/fim

## 2017-02-12 NOTE — Telephone Encounter (Signed)
Pt said call at [phone] and to let whoever answers know that pt has an infusion appt tomorrow and that the call is an urgent request.

## 2017-02-13 ENCOUNTER — Encounter (HOSPITAL_COMMUNITY): Payer: Self-pay

## 2017-02-13 ENCOUNTER — Encounter (HOSPITAL_COMMUNITY)
Admission: RE | Admit: 2017-02-13 | Discharge: 2017-02-13 | Disposition: A | Discharge status: Home / Self Care | Payer: Medicare HMO | Source: Ambulatory Visit | Attending: Neurology | Admitting: Neurology

## 2017-02-13 DIAGNOSIS — G35 Multiple sclerosis: Secondary | ICD-10-CM | POA: Diagnosis not present

## 2017-02-13 MED ORDER — SODIUM CHLORIDE 0.9 % IV SOLN
INTRAVENOUS | Status: AC
  Administered 2017-02-13: 10:00:00 via INTRAVENOUS

## 2017-02-13 MED ORDER — SODIUM CHLORIDE 0.9 % IV SOLN
300.0000 mg | INTRAVENOUS | Status: AC
  Administered 2017-02-13: 300 mg via INTRAVENOUS
  Filled 2017-02-13: qty 15

## 2017-02-13 NOTE — Progress Notes (Signed)
Patient didn't stay the recommended 1 hour infusion time post Tysabri. Aware to call MD for problems or questions.

## 2017-03-13 ENCOUNTER — Encounter (HOSPITAL_COMMUNITY)
Admission: RE | Admit: 2017-03-13 | Discharge: 2017-03-13 | Disposition: A | Discharge status: Home / Self Care | Payer: Medicare HMO | Source: Ambulatory Visit | Attending: Neurology | Admitting: Neurology

## 2017-03-13 ENCOUNTER — Encounter (HOSPITAL_COMMUNITY): Payer: Self-pay

## 2017-03-13 DIAGNOSIS — G35 Multiple sclerosis: Secondary | ICD-10-CM | POA: Diagnosis not present

## 2017-03-13 MED ORDER — SODIUM CHLORIDE 0.9 % IV SOLN
INTRAVENOUS | Status: DC
Start: 2017-03-13 — End: 2017-03-14
  Administered 2017-03-13: 13:00:00 via INTRAVENOUS

## 2017-03-13 MED ORDER — SODIUM CHLORIDE 0.9 % IV SOLN
300.0000 mg | INTRAVENOUS | Status: DC
  Administered 2017-03-13: 300 mg via INTRAVENOUS
  Filled 2017-03-13: qty 15

## 2017-03-13 NOTE — Progress Notes (Signed)
Pt stayed approx 15 mins post Tysabri infusion from the recommended 1 hour Touch; Uneventful infusion; pt and her caregiver aware to notify MD of any difficulties or concerns.

## 2017-03-13 NOTE — Discharge Instructions (Signed)
Natalizumab injection/Tysabri Infusion  °What is this medicine? °NATALIZUMAB (na ta LIZ you mab) is used to treat relapsing multiple sclerosis. This drug is not a cure. It is also used to treat Crohn's disease. °This medicine may be used for other purposes; ask your health care provider or pharmacist if you have questions. °COMMON BRAND NAME(S): Tysabri °What should I tell my health care provider before I take this medicine? °They need to know if you have any of these conditions: °-immune system problems °-progressive multifocal leukoencephalopathy (PML) °-an unusual or allergic reaction to natalizumab, other medicines, foods, dyes, or preservatives °-pregnant or trying to get pregnant °-breast-feeding °How should I use this medicine? °This medicine is for infusion into a vein. It is given by a health care professional in a hospital or clinic setting. °A special MedGuide will be given to you by the pharmacist with each prescription and refill. Be sure to read this information carefully each time. °Talk to your pediatrician regarding the use of this medicine in children. This medicine is not approved for use in children. °Overdosage: If you think you have taken too much of this medicine contact a poison control center or emergency room at once. °NOTE: This medicine is only for you. Do not share this medicine with others. °What if I miss a dose? °It is important not to miss your dose. Call your doctor or health care professional if you are unable to keep an appointment. °What may interact with this medicine? °-azathioprine °-cyclosporine °-interferon °-6-mercaptopurine °-methotrexate °-steroid medicines like prednisone or cortisone °-TNF-alpha inhibitors like adalimumab, etanercept, and infliximab °-vaccines °This list may not describe all possible interactions. Give your health care provider a list of all the medicines, herbs, non-prescription drugs, or dietary supplements you use. Also tell them if you smoke, drink  alcohol, or use illegal drugs. Some items may interact with your medicine. °What should I watch for while using this medicine? °Your condition will be monitored carefully while you are receiving this medicine. Visit your doctor for regular check ups. Tell your doctor or healthcare professional if your symptoms do not start to get better or if they get worse. °Stay away from people who are sick. Call your doctor or health care professional for advice if you get a fever, chills or sore throat, or other symptoms of a cold or flu. Do not treat yourself. °In some patients, this medicine may cause a serious brain infection that may cause death. If you have any problems seeing, thinking, speaking, walking, or standing, tell your doctor right away. If you cannot reach your doctor, get urgent medical care. °What side effects may I notice from receiving this medicine? °Side effects that you should report to your doctor or health care professional as soon as possible: °-allergic reactions like skin rash, itching or hives, swelling of the face, lips, or tongue °-breathing problems °-changes in vision °-chest pain °-dark urine °-depression, feelings of sadness °-dizziness °-general ill feeling or flu-like symptoms °-irregular, missed, or painful menstrual periods °-light-colored stools °-loss of appetite, nausea °-muscle weakness °-problems with balance, talking, or walking °-right upper belly pain °-unusually weak or tired °-yellowing of the eyes or skin °Side effects that usually do not require medical attention (report to your doctor or health care professional if they continue or are bothersome): °-aches, pains °-headache °-stomach upset °-tiredness °This list may not describe all possible side effects. Call your doctor for medical advice about side effects. You may report side effects to FDA at 1-800-FDA-1088. °Where should   I keep my medicine? °This drug is given in a hospital or clinic and will not be stored at  home. °NOTE: This sheet is a summary. It may not cover all possible information. If you have questions about this medicine, talk to your doctor, pharmacist, or health care provider. °© 2018 Elsevier/Gold Standard (2008-03-04 13:33:21) ° °

## 2017-03-27 DIAGNOSIS — I639 Cerebral infarction, unspecified: Secondary | ICD-10-CM

## 2017-03-27 HISTORY — DX: Cerebral infarction, unspecified: I63.9

## 2017-03-31 ENCOUNTER — Other Ambulatory Visit (HOSPITAL_COMMUNITY): Payer: Self-pay | Admitting: General Practice

## 2017-04-08 ENCOUNTER — Encounter: Payer: Self-pay | Admitting: Emergency Medicine

## 2017-04-08 ENCOUNTER — Emergency Department: Payer: Medicare HMO

## 2017-04-08 ENCOUNTER — Observation Stay
Admit: 2017-04-08 | Discharge: 2017-04-08 | Disposition: A | Discharge status: Home / Self Care | Payer: Medicare HMO | Attending: Internal Medicine | Admitting: Internal Medicine

## 2017-04-08 ENCOUNTER — Other Ambulatory Visit: Payer: Self-pay

## 2017-04-08 ENCOUNTER — Observation Stay: Payer: Medicare HMO

## 2017-04-08 ENCOUNTER — Inpatient Hospital Stay
Admission: EM | Admit: 2017-04-08 | Discharge: 2017-04-11 | DRG: 066 | Disposition: A | Discharge status: Home / Self Care | Payer: Medicare HMO | Attending: Internal Medicine | Admitting: Internal Medicine

## 2017-04-08 DIAGNOSIS — R471 Dysarthria and anarthria: Secondary | ICD-10-CM | POA: Diagnosis present

## 2017-04-08 DIAGNOSIS — Z79899 Other long term (current) drug therapy: Secondary | ICD-10-CM

## 2017-04-08 DIAGNOSIS — R29810 Facial weakness: Secondary | ICD-10-CM | POA: Diagnosis present

## 2017-04-08 DIAGNOSIS — G459 Transient cerebral ischemic attack, unspecified: Secondary | ICD-10-CM

## 2017-04-08 DIAGNOSIS — F40240 Claustrophobia: Secondary | ICD-10-CM | POA: Diagnosis present

## 2017-04-08 DIAGNOSIS — H9190 Unspecified hearing loss, unspecified ear: Secondary | ICD-10-CM | POA: Diagnosis present

## 2017-04-08 DIAGNOSIS — G4733 Obstructive sleep apnea (adult) (pediatric): Secondary | ICD-10-CM | POA: Diagnosis present

## 2017-04-08 DIAGNOSIS — G35 Multiple sclerosis: Secondary | ICD-10-CM | POA: Diagnosis present

## 2017-04-08 DIAGNOSIS — R29702 NIHSS score 2: Secondary | ICD-10-CM | POA: Diagnosis present

## 2017-04-08 DIAGNOSIS — I639 Cerebral infarction, unspecified: Secondary | ICD-10-CM | POA: Diagnosis not present

## 2017-04-08 DIAGNOSIS — I10 Essential (primary) hypertension: Secondary | ICD-10-CM | POA: Diagnosis present

## 2017-04-08 DIAGNOSIS — R4701 Aphasia: Secondary | ICD-10-CM | POA: Diagnosis present

## 2017-04-08 DIAGNOSIS — Z885 Allergy status to narcotic agent status: Secondary | ICD-10-CM

## 2017-04-08 DIAGNOSIS — Z823 Family history of stroke: Secondary | ICD-10-CM

## 2017-04-08 LAB — COMPREHENSIVE METABOLIC PANEL
ALT: 22 U/L (ref 14–54)
AST: 26 U/L (ref 15–41)
Albumin: 4.2 g/dL (ref 3.5–5.0)
Alkaline Phosphatase: 75 U/L (ref 38–126)
Anion gap: 10 (ref 5–15)
BUN: 14 mg/dL (ref 6–20)
CHLORIDE: 108 mmol/L (ref 101–111)
CO2: 23 mmol/L (ref 22–32)
Calcium: 9.1 mg/dL (ref 8.9–10.3)
Creatinine, Ser: 1.07 mg/dL — ABNORMAL HIGH (ref 0.44–1.00)
GFR calc Af Amer: >60 mL/min (ref >60)
GFR, EST NON AFRICAN AMERICAN: 57 mL/min — AB (ref >60)
Glucose, Bld: 147 mg/dL — ABNORMAL HIGH (ref 65–99)
Potassium: 3.7 mmol/L (ref 3.5–5.1)
Sodium: 141 mmol/L (ref 135–145)
Total Bilirubin: 1 mg/dL (ref 0.3–1.2)
Total Protein: 7 g/dL (ref 6.5–8.1)

## 2017-04-08 LAB — URINE DRUG SCREEN, QUALITATIVE (ARMC ONLY)
Amphetamines, Ur Screen: NOT DETECTED
Barbiturates, Ur Screen: NOT DETECTED
Benzodiazepine, Ur Scrn: NOT DETECTED
CANNABINOID 50 NG, UR ~~LOC~~: NOT DETECTED
Cocaine Metabolite,Ur ~~LOC~~: NOT DETECTED
MDMA (ECSTASY) UR SCREEN: NOT DETECTED
Methadone Scn, Ur: NOT DETECTED
Opiate, Ur Screen: NOT DETECTED
Phencyclidine (PCP) Ur S: NOT DETECTED
TRICYCLIC, UR SCREEN: NOT DETECTED

## 2017-04-08 LAB — URINALYSIS, ROUTINE W REFLEX MICROSCOPIC
BILIRUBIN URINE: NEGATIVE
Glucose, UA: NEGATIVE mg/dL
Hgb urine dipstick: NEGATIVE
Ketones, ur: NEGATIVE mg/dL
Leukocytes, UA: NEGATIVE
NITRITE: NEGATIVE
PROTEIN: NEGATIVE mg/dL
SPECIFIC GRAVITY, URINE: 1.003 — AB (ref 1.005–1.030)
pH: 7 (ref 5.0–8.0)

## 2017-04-08 LAB — DIFFERENTIAL
BASOS PCT: 1 %
Basophils Absolute: 0.1 10*3/uL (ref 0–0.1)
EOS ABS: 0.3 10*3/uL (ref 0–0.7)
Eosinophils Relative: 3 %
Lymphocytes Relative: 20 %
Lymphs Abs: 2.1 10*3/uL (ref 1.0–3.6)
MONO ABS: 0.5 10*3/uL (ref 0.2–0.9)
Monocytes Relative: 5 %
Neutro Abs: 7.5 10*3/uL — ABNORMAL HIGH (ref 1.4–6.5)
Neutrophils Relative %: 71 %

## 2017-04-08 LAB — ECHOCARDIOGRAM COMPLETE
Height: 69 in
WEIGHTICAEL: 4400 [oz_av]

## 2017-04-08 LAB — CBC
HCT: 44.6 % (ref 35.0–47.0)
Hemoglobin: 14.8 g/dL (ref 12.0–16.0)
MCH: 29.3 pg (ref 26.0–34.0)
MCHC: 33.1 g/dL (ref 32.0–36.0)
MCV: 88.5 fL (ref 80.0–100.0)
Platelets: 237 10*3/uL (ref 150–440)
RBC: 5.05 MIL/uL (ref 3.80–5.20)
RDW: 15.1 % — AB (ref 11.5–14.5)
WBC: 10.5 10*3/uL (ref 3.6–11.0)

## 2017-04-08 LAB — TROPONIN I

## 2017-04-08 LAB — PROTIME-INR
INR: 0.92
Prothrombin Time: 12.3 seconds (ref 11.4–15.2)

## 2017-04-08 LAB — APTT: APTT: 28 s (ref 24–36)

## 2017-04-08 LAB — ETHANOL

## 2017-04-08 MED ORDER — ASPIRIN 325 MG PO TABS
325.0000 mg | ORAL_TABLET | Freq: Every day | ORAL | Status: DC
  Administered 2017-04-09: 325 mg via ORAL
  Filled 2017-04-08 (×2): qty 1

## 2017-04-08 MED ORDER — ASPIRIN 81 MG PO CHEW
324.0000 mg | CHEWABLE_TABLET | Freq: Once | ORAL | Status: AC
  Administered 2017-04-08: 324 mg via ORAL
  Filled 2017-04-08: qty 4

## 2017-04-08 MED ORDER — ENOXAPARIN SODIUM 40 MG/0.4ML ~~LOC~~ SOLN
40.0000 mg | Freq: Every day | SUBCUTANEOUS | Status: DC
  Administered 2017-04-08 – 2017-04-09 (×2): 40 mg via SUBCUTANEOUS
  Filled 2017-04-08 (×2): qty 0.4

## 2017-04-08 MED ORDER — ALPRAZOLAM 0.5 MG PO TABS
0.5000 mg | ORAL_TABLET | Freq: Two times a day (BID) | ORAL | Status: DC | PRN
  Administered 2017-04-08 – 2017-04-10 (×5): 0.5 mg via ORAL
  Filled 2017-04-08 (×5): qty 1

## 2017-04-08 MED ORDER — OXYBUTYNIN CHLORIDE ER 5 MG PO TB24
15.0000 mg | ORAL_TABLET | Freq: Every day | ORAL | Status: DC
  Administered 2017-04-08 – 2017-04-10 (×3): 15 mg via ORAL
  Filled 2017-04-08: qty 1
  Filled 2017-04-08 (×2): qty 3

## 2017-04-08 MED ORDER — CARVEDILOL 25 MG PO TABS
25.0000 mg | ORAL_TABLET | Freq: Two times a day (BID) | ORAL | Status: DC
  Administered 2017-04-08 – 2017-04-11 (×6): 25 mg via ORAL
  Filled 2017-04-08 (×6): qty 1

## 2017-04-08 MED ORDER — BACLOFEN 10 MG PO TABS
10.0000 mg | ORAL_TABLET | Freq: Three times a day (TID) | ORAL | Status: DC | PRN
  Filled 2017-04-08: qty 1

## 2017-04-08 MED ORDER — LISINOPRIL 20 MG PO TABS
40.0000 mg | ORAL_TABLET | Freq: Every day | ORAL | Status: DC
  Administered 2017-04-08 – 2017-04-11 (×4): 40 mg via ORAL
  Filled 2017-04-08 (×4): qty 2

## 2017-04-08 MED ORDER — VITAMIN D 1000 UNITS PO TABS
5000.0000 [IU] | ORAL_TABLET | Freq: Every day | ORAL | Status: DC
  Administered 2017-04-09 – 2017-04-11 (×3): 5000 [IU] via ORAL
  Filled 2017-04-08 (×3): qty 5

## 2017-04-08 MED ORDER — STROKE: EARLY STAGES OF RECOVERY BOOK
Freq: Once | Status: AC
  Administered 2017-04-08: 12:00:00

## 2017-04-08 MED ORDER — ACETAMINOPHEN 500 MG PO TABS
500.0000 mg | ORAL_TABLET | Freq: Four times a day (QID) | ORAL | Status: DC | PRN
  Administered 2017-04-10 – 2017-04-11 (×2): 500 mg via ORAL
  Filled 2017-04-08 (×2): qty 1

## 2017-04-08 MED ORDER — HYDRALAZINE HCL 50 MG PO TABS
50.0000 mg | ORAL_TABLET | Freq: Two times a day (BID) | ORAL | Status: DC
  Administered 2017-04-08 – 2017-04-10 (×5): 50 mg via ORAL
  Filled 2017-04-08 (×5): qty 1

## 2017-04-08 MED ORDER — ASPIRIN 300 MG RE SUPP
300.0000 mg | Freq: Every day | RECTAL | Status: DC
  Filled 2017-04-08: qty 1

## 2017-04-08 MED ORDER — SPIRONOLACTONE 25 MG PO TABS
50.0000 mg | ORAL_TABLET | Freq: Every day | ORAL | Status: DC
  Administered 2017-04-08 – 2017-04-11 (×4): 50 mg via ORAL
  Filled 2017-04-08 (×4): qty 2

## 2017-04-08 MED ORDER — FAMOTIDINE 20 MG PO TABS
20.0000 mg | ORAL_TABLET | Freq: Every day | ORAL | Status: DC
  Administered 2017-04-09 – 2017-04-11 (×3): 20 mg via ORAL
  Filled 2017-04-08 (×3): qty 1

## 2017-04-08 MED ORDER — VITAMIN D3 125 MCG (5000 UT) PO CAPS: 1.0000 | ORAL_CAPSULE | Freq: Every day | ORAL | Status: DC

## 2017-04-08 NOTE — ED Triage Notes (Signed)
Pt in via POV with complaints of hypertension, per EMS BP 220/110 w/ fire rescue.  Pt reports not taking meds routinely.  Pt tearful, does not explain why.  Pt with hx of MS.  NAD noted at this time.

## 2017-04-08 NOTE — ED Notes (Signed)
Patient transported to CT 

## 2017-04-08 NOTE — ED Notes (Signed)
Urine POC negative. 

## 2017-04-08 NOTE — Progress Notes (Signed)
Patient alert and oriented, some slurred speech and dysarthria.  No other obvious neuro deficits.  Patient concerned she is having MS relapse and has questions about her Natalizumab infusion she recieves o/p.  I explained that Dr. Thad Ranger with neuro was scheduled to consult on her and she will make any appropriate neurological suggestions on this admission.

## 2017-04-08 NOTE — Progress Notes (Signed)
*  PRELIMINARY RESULTS* Echocardiogram 2D Echocardiogram has been performed.  Rachel Gilbert 04/08/2017, 1:21 PM

## 2017-04-08 NOTE — ED Notes (Signed)
Pt ambulatory to toilet with standby assist. 

## 2017-04-08 NOTE — ED Provider Notes (Signed)
[facility] Emergency Department Provider Note  ____________________________________________  Time seen: Approximately 10:43 AM  I have reviewed the triage vital signs and the nursing notes.   HISTORY  Chief Complaint Hypertension   HPI Rachel Gilbert is a 57 y.o. female with a history of OSA on CPAP, multiple sclerosis, hypertension who presents for evaluation of strokelike symptoms. Last seen normal was yesterday evening. This morning when she woke up she reports that she was feeling off and generalized weakness. When she went to brush her teeth, she noted a right-sided facial droop in the mirror. She called a friend who noted that she had slurred speech on the phone. Facial droop has now resolved but patient continues to have difficulty finding words. No unilateral weakness or numbness, no headache. No personal history of strokes. Patient has family history of stroke in her father. She is not a smoker. She reports that she does not take her antihypertensive medications as prescribed. She denies chest pain or shortness of breath, nausea or vomiting.  Chief Complaint: stroke like symptoms Location: R sided Quality: droop Severity: moderate Duration: ongoing since when patient woke up Timing: last normal, last night Associated signs/symptoms: slurred speech, facial droop, difficulty finding words  Past Medical History:  Diagnosis Date  . Complication of anesthesia    hard time waking up   . Hearing loss   . Hypertension   . Kidney stones   . Multiple sclerosis (HCC)   . Sleep apnea    Uses CPAP  . Vision abnormalities     Patient Active Problem List   Diagnosis Date Noted  . Attention deficit 11/06/2016  . 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 Insomnia 11/16/2014  . Other fatigue 07/03/2014  . Urinary frequency 07/03/2014  . Gait disturbance 06/14/2014  . Depression with anxiety 06/14/2014  . Vitamin D deficiency 06/14/2014  . Multiple sclerosis  exacerbation (HCC) 06/04/2014  . Essential hypertension 06/04/2014  . Obesity 06/04/2014  . Obstructive sleep apnea on CPAP 06/04/2014  . Gastroesophageal reflux disease without esophagitis 06/04/2014  . Multiple sclerosis (HCC) 06/04/2014    Past Surgical History:  Procedure Laterality Date  . CESAREAN SECTION  1986  . CHOLECYSTECTOMY  2000   laparoscopic    Prior to Admission medications   Medication Sig Start Date End Date Taking? Authorizing Provider  acetaminophen (TYLENOL) 500 MG tablet Take 500 mg by mouth every 6 (six) hours as needed for mild pain.   Yes [provider]  ALPRAZolam Prudy Feeler) 0.5 MG tablet Take 0.5 mg by mouth 2 (two) times daily as needed for anxiety.   Yes [provider]  baclofen (LIORESAL) 10 MG tablet Take 1 tablet (10 mg total) by mouth 3 (three) times daily as needed for muscle spasms. 09/19/14  Yes Sater, Pearletha Furl, MD  carvedilol (COREG) 25 MG tablet Take 25 mg by mouth 2 (two) times daily with a meal.   Yes [provider]  Cholecalciferol (VITAMIN D3) 5000 units CAPS Take 1 capsule by mouth daily.   Yes [provider]  hydrALAZINE (APRESOLINE) 50 MG tablet Take 50 mg by mouth 2 (two) times daily.   Yes [provider]  ibuprofen (ADVIL,MOTRIN) 200 MG tablet Take 200 mg by mouth every 6 (six) hours as needed for moderate pain.   Yes [provider]  lisinopril (PRINIVIL,ZESTRIL) 40 MG tablet Take 40 mg by mouth daily.   Yes [provider]  methylphenidate (RITALIN) 10 MG tablet Take 2 pills qAM and one  pill po 4 hours later 02/10/17  Yes Sater, Pearletha Furl, MD  natalizumab (TYSABRI) 300 MG/15ML injection Inject 15 mLs into the vein every 28 (twenty-eight) days.    Yes [provider]  ondansetron (ZOFRAN-ODT) 4 MG disintegrating tablet Take 4 mg by mouth every 8 (eight) hours as needed for nausea or vomiting.   Yes [provider]  oxybutynin (DITROPAN XL) 15 MG 24 hr tablet  Take 1 tablet (15 mg total) by mouth at bedtime. 05/14/16  Yes Sater, Pearletha Furl, MD  ranitidine (ZANTAC) 300 MG tablet Take 300 mg by mouth every evening.   Yes [provider]  spironolactone (ALDACTONE) 50 MG tablet Take 50 mg by mouth daily.   Yes [provider]  FLUoxetine (PROZAC) 40 MG capsule Take 1 capsule (40 mg total) by mouth daily. Patient not taking: Reported on 04/08/2017 06/07/14   Gale Journey, MD  traZODone (DESYREL) 100 MG tablet Take 1 tablet (100 mg total) by mouth at bedtime. Patient not taking: Reported on 04/08/2017 11/16/14   Asa Lente, MD    Allergies Oxycodone  Family History  Problem Relation Age of Onset  . Fibromyalgia Mother   . Stroke Father   . Diabetes Father   . Stroke Other     Social History Social History   Tobacco Use  . Smoking status: Never Smoker  . Smokeless tobacco: Never Used  Substance Use Topics  . Alcohol use: Yes    Comment: Occasional  . Drug use: No    Review of Systems  Constitutional: Negative for fever. Eyes: Negative for visual changes. ENT: Negative for sore throat. Neck: No neck pain  Cardiovascular: Negative for chest pain. Respiratory: Negative for shortness of breath. Gastrointestinal: Negative for abdominal pain, vomiting or diarrhea. Genitourinary: Negative for dysuria. Musculoskeletal: Negative for back pain. Skin: Negative for rash. Neurological: Negative for headaches. + facial droop, slurred speech, difficulty finding words. Psych: No SI or HI  ____________________________________________   PHYSICAL EXAM:  VITAL SIGNS: ED Triage Vitals  Enc Vitals Group     BP 04/08/17 0903 (!) 171/92     Pulse Rate 04/08/17 0903 77     Resp 04/08/17 0903 16     Temp 04/08/17 0903 (!) 97.2 F (36.2 C)     Temp Source 04/08/17 0903 Oral     SpO2 04/08/17 0903 97 %     Weight 04/08/17 0904 275 lb (124.7 kg)     Height 04/08/17 0904 5\' 9"  (1.753 m)     Head Circumference --       Peak Flow --      Pain Score --      Pain Loc --      Pain Edu? --      Excl. in GC? --     Constitutional: Alert and oriented. Well appearing and in no apparent distress. HEENT:      Head: Normocephalic and atraumatic.         Eyes: Conjunctivae are normal. Sclera is non-icteric.       Mouth/Throat: Mucous membranes are moist.       Neck: Supple with no signs of meningismus. Cardiovascular: Regular rate and rhythm. No murmurs, gallops, or rubs. 2+ symmetrical distal pulses are present in all extremities. No JVD. Respiratory: Normal respiratory effort. Lungs are clear to auscultation bilaterally. No wheezes, crackles, or rhonchi.  Gastrointestinal: Soft, non tender, and non distended with positive bowel sounds. No rebound or guarding. Genitourinary: No CVA tenderness. Musculoskeletal: Nontender  with normal range of motion in all extremities. No edema, cyanosis, or erythema of extremities. Neurologic: normal speech, difficulty finding words is noted. A & O x3, PERRL, EOMI, no nystagmus, CN II-XII intact, motor testing reveals good tone and bulk throughout. There is no evidence of pronator drift or dysmetria. Muscle strength is 5/5 throughout.Sensory examination is intact. Gait is normal. Skin: Skin is warm, dry and intact. No rash noted. Psychiatric: Mood and affect are normal. Speech and behavior are normal.  ____________________________________________   LABS (all labs ordered are listed, but only abnormal results are displayed)  Labs Reviewed  CBC - Abnormal; Notable for the following components:      Result Value   RDW 15.1 (*)    All other components within normal limits  DIFFERENTIAL - Abnormal; Notable for the following components:   Neutro Abs 7.5 (*)    All other components within normal limits  COMPREHENSIVE METABOLIC PANEL - Abnormal; Notable for the following components:   Glucose, Bld 147 (*)    Creatinine, Ser 1.07 (*)    GFR calc non Af Amer 57 (*)    All other  components within normal limits  URINALYSIS, ROUTINE W REFLEX MICROSCOPIC - Abnormal; Notable for the following components:   Color, Urine COLORLESS (*)    APPearance CLEAR (*)    Specific Gravity, Urine 1.003 (*)    All other components within normal limits  ETHANOL  PROTIME-INR  APTT  TROPONIN I  URINE DRUG SCREEN, QUALITATIVE ([facility] ONLY)  POC URINE PREG, ED   ____________________________________________  EKG  ED ECG REPORT I, Nita Sickle, the attending physician, personally viewed and interpreted this ECG.  Normal sinus rhythm, rate of 77, normal intervals, normal axis, no ST elevations or depressions, T-wave inversions in lateral leads. unchanged from prior from 2016 ____________________________________________  RADIOLOGY  I have personally reviewed the images performed during this visit and I agree with the Radiologist's read.   Interpretation by Radiologist:  Ct Head Wo Contrast  Result Date: 04/08/2017 CLINICAL DATA:  Altered mental status.  Headache.  Hypertension. EXAM: CT HEAD WITHOUT CONTRAST TECHNIQUE: Contiguous axial images were obtained from the base of the skull through the vertex without intravenous contrast. COMPARISON:  July 02, 2016 brain MRI FINDINGS: Brain: The ventricles are normal in size and configuration. There is no intracranial mass, hemorrhage, extra-axial fluid collection, or midline shift. Decreased attenuation is noted in the centra semiovale bilaterally. This appearance is essentially stable compared to prior MR. No new gray-white compartment lesion is demonstrated on this study. No acute infarct is appreciable. Vascular: No hyperdense vessel. There is calcification in each carotid siphon. Skull: Bony calvarium appears intact. Sinuses/Orbits: There is mucosal thickening in several ethmoid air cells. Other paranasal sinuses which are visualized are clear. Orbits appear symmetric bilaterally. Other: Mastoid air cells are clear. A small protein  containing cyst is noted in the soft tissues immediately adjacent to the superior frontal bone measuring 1 x 1 cm. IMPRESSION: Decreased attenuation in the periventricular white matter adjacent to the frontal horns is again noted. Question small vessel disease versus demyelination. Both entities may exist concurrently. Appearance is essentially stable compared to prior MR. No acute infarct evident. No mass or hemorrhage. There are foci of arteriovascular calcification. There is mucosal thickening in several ethmoid air cells. Electronically Signed   By: Bretta Bang III M.D.   On: 04/08/2017 09:43     ____________________________________________   PROCEDURES  Procedure(s) performed: None Procedures Critical Care performed:  None ____________________________________________  INITIAL IMPRESSION / ASSESSMENT AND PLAN / ED COURSE   57 y.o. female with a history of OSA on CPAP, multiple sclerosis, hypertension who presents for evaluation of strokelike symptoms. last seen normal was last night. Exam patient does have difficulty finding words, remaining of her neuro exam is within normal limits. CT head with no acute findings. Patient was given a full aspirin. MRI is pending. Labs WNL. Presentation concerning for CVA versus TIA versus MS flare.      As part of my medical decision making, I reviewed the following data within the electronic MEDICAL RECORD NUMBER History obtained from family, Nursing notes reviewed and incorporated, Labs reviewed , EKG interpreted , Old EKG reviewed, Old chart reviewed, Radiograph reviewed , Discussed with admitting physician , Notes from prior ED visits and [place] Controlled Substance Database    Pertinent labs & imaging results that were available during my care of the patient were reviewed by me and considered in my medical decision making (see chart for details).    ____________________________________________   FINAL CLINICAL IMPRESSION(S) / ED  DIAGNOSES  Final diagnoses:  Cerebrovascular accident (CVA), unspecified mechanism (HCC)      NEW MEDICATIONS STARTED DURING THIS VISIT:  ED Discharge Orders    None       Note:  This document was prepared using Dragon voice recognition software and may include unintentional dictation errors.    Don Perking, [place], MD 04/08/17 1050

## 2017-04-08 NOTE — Progress Notes (Signed)
SLP Cancellation Note  Patient Details Name: Rachel Gilbert MRN: [id number] DOB: 1960/11/18   Cancelled treatment:       Reason Eval/Treat Not Completed: Patient at procedure or test/unavailable(chart reviewed; NSG consulted ) NSG reported pt was leaving room for tests but indicated pt had passed the swallow screen in the ED for an oral diet. ST services will f/u tomorrow w/ any Cognitive assessment as pt will be fatigued from admission and multiple tests out of room. NSG agreed.    [name], MS, CCC-SLP Watson,Katherine 04/08/2017, 2:45 PM

## 2017-04-08 NOTE — Evaluation (Addendum)
Physical Therapy Evaluation Patient Details Name: Rachel Gilbert MRN: [id number] DOB: [birth year] Today's Date: 04/08/2017   History of Present Illness  Pt is a 57 y.o. female presenting to hospital with generalized weakness, R sided facial droop (resolved), slurred speech, and difficulty finding words.  Pt admitted with stroke like symptoms (CVA vs TIA vs MS flare).  PMH includes MS, hearing loss, htn, OSA on CPAP.  Clinical Impression  Prior to hospital admission, pt was independent.  Pt lives alone in 1 level home with ramp to enter.  Currently pt is independent with bed mobility, independent with transfers, and supervision with ambulation no AD short distances in room (pt occasionally briefly holding onto furniture but overall appearing steady).  Overall pt tolerated mobility well.  Pt would benefit from skilled PT to address noted impairments and functional limitations (see below for any additional details) during hospital stay.  Upon hospital discharge, recommend pt discharge to home (no further PT needs anticipated upon hospital discharge).    Follow Up Recommendations No PT follow up    Equipment Recommendations  None recommended by PT    Recommendations for Other Services       Precautions / Restrictions Precautions Precautions: Fall Restrictions Weight Bearing Restrictions: No      Mobility  Bed Mobility Overal bed mobility: Independent             General bed mobility comments: Supine to/from sit without any difficulties.  Transfers Overall transfer level: Independent Equipment used: None             General transfer comment: steady strong transfers from bed and toilet  Ambulation/Gait Ambulation/Gait assistance: Supervision Ambulation Distance (Feet): (15 feet x2 (to bathroom and back)) Assistive device: None   Gait velocity: mildly decreased   General Gait Details: mild increased BOS; increased B lateral sway; pt occasionally briefly holding onto  furniture to/from bathroom but appearing steady  Information systems manager [employer] (Stroke Patients Only)       Balance Overall balance assessment: Needs assistance Sitting-balance support: No upper extremity supported;Feet supported Sitting balance-Leahy Scale: Normal Sitting balance - Comments: steady sitting reaching within BOS   Standing balance support: No upper extremity supported Standing balance-Leahy Scale: Good Standing balance comment: steady standing performing toileting hygiene and washing hands at sink                             Pertinent Vitals/Pain Pain Assessment: No/denies pain    Home Living Family/patient expects to be discharged to:: Private residence Living Arrangements: Alone Available Help at Discharge: Family(caregiver (caregiver reports she doesn't really have to do anything for pt though)) Type of Home: House Home Access: Ramped entrance     Home Layout: One level Home Equipment: Cane - single point;Grab bars - tub/shower      Prior Function Level of Independence: Independent         Comments: Pt reports no falls in past 6 months.  Most recently pt not using any AD for ambulation (used SPC prior to that).     Hand Dominance        Extremity/Trunk Assessment   Upper Extremity Assessment Upper Extremity Assessment: Overall WFL for tasks assessed    Lower Extremity Assessment Lower Extremity Assessment: (4+/5 B hip flexion, knee flexion, knee extension, and DF; good B PF strength; intact B LE sensation, proprioception, and tone;  mild difficulty with B LE heel to shin coordination (similar both sides))    Cervical / Trunk Assessment Cervical / Trunk Assessment: Normal  Communication   Communication: HOH  Cognition Arousal/Alertness: Awake/alert Behavior During Therapy: WFL for tasks assessed/performed Overall Cognitive Status: Within Functional Limits for tasks assessed                                         General Comments General comments (skin integrity, edema, etc.): Pt resting in bed upon PT arrival; caregiver present; daughter came during session.  Nursing cleared pt for participation in physical therapy.  Pt agreeable to PT session.    Exercises     Assessment/Plan    PT Assessment Patient needs continued PT services  PT Problem List Decreased balance;Decreased mobility       PT Treatment Interventions Gait training;Functional mobility training;Therapeutic activities;Therapeutic exercise;Balance training;Patient/family education;DME instruction    PT Goals (Current goals can be found in the Care Plan section)  Acute Rehab PT Goals Patient Stated Goal: to improve her speech PT Goal Formulation: With patient Time For Goal Achievement: 04/22/17 Potential to Achieve Goals: Fair    Frequency Min 2X/week   Barriers to discharge        Co-evaluation               AM-PAC PT "6 Clicks" Daily Activity  Outcome Measure Difficulty turning over in bed (including adjusting bedclothes, sheets and blankets)?: None Difficulty moving from lying on back to sitting on the side of the bed? : None Difficulty sitting down on and standing up from a chair with arms (e.g., wheelchair, bedside commode, etc,.)?: None Help needed moving to and from a bed to chair (including a wheelchair)?: None Help needed walking in hospital room?: A Little Help needed climbing 3-5 steps with a railing? : A Little 6 Click Score: 22    End of Session   Activity Tolerance: Patient tolerated treatment well Patient left: in bed;with call bell/phone within reach;with family/visitor present(Fall risk score 4 (no alarm required)) Nurse Communication: Mobility status PT Visit Diagnosis: Other abnormalities of gait and mobility (R26.89);Muscle weakness (generalized) (M62.81)    Time: 3646-8032 PT Time Calculation (min) (ACUTE ONLY): 13 min   Charges:   PT  Evaluation $PT Eval Low Complexity: 1 Low     PT G CodesHendricks Limes, PT 04/08/17, 5:19 PM [phone]  Addendum:  Some difficulty with word finding and some slurred speech noted.  Hendricks Limes, PT 04/08/17, 5:24 PM [phone]

## 2017-04-08 NOTE — Progress Notes (Signed)
OT Cancellation Note  Patient Details Name: Rachel Gilbert MRN: [id number] DOB: 04-13-1960   Cancelled Treatment:    Reason Eval/Treat Not Completed: Patient at procedure or test/ unavailable. Order received, chart reviewed. Pt out of room for diagnostic testing. Will re-attempt OT evaluation at later date/time as pt is available and medically appropriate.  Richrd Prime, MPH, MS, OTR/L ascom [phone] 04/08/17, 1:29 PM

## 2017-04-08 NOTE — H&P (Signed)
[facility]   PATIENT NAME: Rachel Gilbert    MR#:  [id number]  DATE OF BIRTH:  Oct 20, 1960  DATE OF ADMISSION:  04/08/2017  PRIMARY CARE PHYSICIAN: Josph Macho, FNP   REQUESTING/REFERRING PHYSICIAN:   CHIEF COMPLAINT:   Chief Complaint  Patient presents with  . Hypertension    HISTORY OF PRESENT ILLNESS: Rachel Gilbert  is a 57 y.o. female with a known history of multiple sclerosis, sleep apnea uses CPAP at bedtime, hypertension presented to the emergency room because of slurred speech and facial droop.  Patient got up this morning around 6 AM noticed facial droop on the right side and also had trouble with speech.  She had trouble expressing words and also had slurred speech she called her caregiver around 8:10 AM EMS was called and she came to the emergency room.[name] Her facial droop has resolved by the time she arrived to emergency room.  Her speech also has improved.  No tingling and numbness in any part of the body.  No weakness in any part of the body.  She gets IV infusion therapy at [facility] for multiple sclerosis once a month.  CT head was done during the workup in the emergency room which showed no acute abnormality.  Patient was given oral aspirin in the emergency room and hospitalist service was consulted.  PAST MEDICAL HISTORY:   Past Medical History:  Diagnosis Date  . Complication of anesthesia    hard time waking up   . Hearing loss   . Hypertension   . Kidney stones   . Multiple sclerosis (HCC)   . Sleep apnea    Uses CPAP  . Vision abnormalities     PAST SURGICAL HISTORY:  Past Surgical History:  Procedure Laterality Date  . CESAREAN SECTION  1986  . CHOLECYSTECTOMY  2000   laparoscopic    SOCIAL HISTORY:  Social History   Tobacco Use  . Smoking status: Never Smoker  . Smokeless tobacco: Never Used  Substance Use Topics  . Alcohol use: Yes    Comment: Occasional    FAMILY HISTORY:   Family History  Problem Relation Age of Onset  . Fibromyalgia Mother   . Stroke Father   . Diabetes Father   . Stroke Other     DRUG ALLERGIES:  Allergies  Allergen Reactions  . Oxycodone Anaphylaxis    REVIEW OF SYSTEMS:   CONSTITUTIONAL: No fever, fatigue or weakness.  EYES: No blurred or double vision.  EARS, NOSE, AND THROAT: No tinnitus or ear pain.  RESPIRATORY: No cough, shortness of breath, wheezing or hemoptysis.  CARDIOVASCULAR: No chest pain, orthopnea, edema.  GASTROINTESTINAL: No nausea, vomiting, diarrhea or abdominal pain.  GENITOURINARY: No dysuria, hematuria.  ENDOCRINE: No polyuria, nocturia,  HEMATOLOGY: No anemia, easy bruising or bleeding SKIN: No rash or lesion. MUSCULOSKELETAL: No joint pain or arthritis.   NEUROLOGIC: No tingling, numbness, weakness.  Facial droop right side Difficulty in speech PSYCHIATRY: No anxiety or depression.   MEDICATIONS AT HOME:  Prior to Admission medications   Medication Sig Start Date End Date Taking? Authorizing Provider  acetaminophen (TYLENOL) 500 MG tablet Take 500 mg by mouth every 6 (six) hours as needed for mild pain.   Yes [provider]  ALPRAZolam Prudy Feeler) 0.5 MG tablet Take 0.5 mg by mouth 2 (two) times daily as needed for anxiety.   Yes [provider]  baclofen (LIORESAL) 10 MG tablet Take 1 tablet (10  mg total) by mouth 3 (three) times daily as needed for muscle spasms. 09/19/14  Yes Sater, Pearletha Furl, MD  carvedilol (COREG) 25 MG tablet Take 25 mg by mouth 2 (two) times daily with a meal.   Yes [provider]  Cholecalciferol (VITAMIN D3) 5000 units CAPS Take 1 capsule by mouth daily.   Yes [provider]  hydrALAZINE (APRESOLINE) 50 MG tablet Take 50 mg by mouth 2 (two) times daily.   Yes [provider]  ibuprofen (ADVIL,MOTRIN) 200 MG tablet Take 200 mg by mouth every 6 (six) hours as needed for moderate pain.   Yes [provider]  lisinopril  (PRINIVIL,ZESTRIL) 40 MG tablet Take 40 mg by mouth daily.   Yes [provider]  methylphenidate (RITALIN) 10 MG tablet Take 2 pills qAM and one pill po 4 hours later 02/10/17  Yes Sater, Pearletha Furl, MD  natalizumab (TYSABRI) 300 MG/15ML injection Inject 15 mLs into the vein every 28 (twenty-eight) days.    Yes [provider]  ondansetron (ZOFRAN-ODT) 4 MG disintegrating tablet Take 4 mg by mouth every 8 (eight) hours as needed for nausea or vomiting.   Yes [provider]  oxybutynin (DITROPAN XL) 15 MG 24 hr tablet Take 1 tablet (15 mg total) by mouth at bedtime. 05/14/16  Yes Sater, Pearletha Furl, MD  ranitidine (ZANTAC) 300 MG tablet Take 300 mg by mouth every evening.   Yes [provider]  spironolactone (ALDACTONE) 50 MG tablet Take 50 mg by mouth daily.   Yes [provider]  FLUoxetine (PROZAC) 40 MG capsule Take 1 capsule (40 mg total) by mouth daily. Patient not taking: Reported on 04/08/2017 06/07/14   Gale Journey, MD  traZODone (DESYREL) 100 MG tablet Take 1 tablet (100 mg total) by mouth at bedtime. Patient not taking: Reported on 04/08/2017 11/16/14   Asa Lente, MD      PHYSICAL EXAMINATION:   VITAL SIGNS: Blood pressure (!) 175/96, pulse 81, temperature (!) 97.2 F (36.2 C), temperature source Oral, resp. rate 20, height 5\' 9"  (1.753 m), weight 124.7 kg (275 lb), last menstrual period 01/15/2015, SpO2 97 %.  GENERAL:  57 y.o.-year-old patient lying in the bed with no acute distress.  EYES: Pupils equal, round, reactive to light and accommodation. No scleral icterus. Extraocular muscles intact.  HEENT: Head atraumatic, normocephalic. Oropharynx and nasopharynx clear.  NECK:  Supple, no jugular venous distention. No thyroid enlargement, no tenderness.  LUNGS: Normal breath sounds bilaterally, no wheezing, rales,rhonchi or crepitation. No use of accessory muscles of respiration.  CARDIOVASCULAR: S1, S2 normal. No murmurs, rubs,  or gallops.  ABDOMEN: Soft, nontender, nondistended. Bowel sounds present. No organomegaly or mass.  EXTREMITIES: No pedal edema, cyanosis, or clubbing.  NEUROLOGIC: Cranial nerves II through XII are intact. Muscle strength 5/5 in all extremities. Sensation intact. Gait not checked. Facial droop resolved Difficulty in speech improved PSYCHIATRIC: The patient is alert and oriented x 3.  SKIN: No obvious rash, lesion, or ulcer.   LABORATORY PANEL:   CBC Recent Labs  Lab 04/08/17 0943  WBC 10.5  HGB 14.8  HCT 44.6  PLT 237  MCV 88.5  MCH 29.3  MCHC 33.1  RDW 15.1*  LYMPHSABS 2.1  MONOABS 0.5  EOSABS 0.3  BASOSABS 0.1   ------------------------------------------------------------------------------------------------------------------  Chemistries  Recent Labs  Lab 04/08/17 0943  NA 141  K 3.7  CL 108  CO2 23  GLUCOSE 147*  BUN 14  CREATININE 1.07*  CALCIUM 9.1  AST 26  ALT 22  ALKPHOS 75  BILITOT 1.0   ------------------------------------------------------------------------------------------------------------------ estimated creatinine clearance is 84 mL/min (A) (by C-G formula based on SCr of 1.07 mg/dL (H)). ------------------------------------------------------------------------------------------------------------------ No results for input(s): TSH, T4TOTAL, T3FREE, THYROIDAB in the last 72 hours.  Invalid input(s): FREET3   Coagulation profile Recent Labs  Lab 04/08/17 0943  INR 0.92   ------------------------------------------------------------------------------------------------------------------- No results for input(s): DDIMER in the last 72 hours. -------------------------------------------------------------------------------------------------------------------  Cardiac Enzymes Recent Labs  Lab 04/08/17 0943  TROPONINI <0.03    ------------------------------------------------------------------------------------------------------------------ Invalid input(s): POCBNP  ---------------------------------------------------------------------------------------------------------------  Urinalysis    Component Value Date/Time   COLORURINE COLORLESS (A) 04/08/2017 0943   APPEARANCEUR CLEAR (A) 04/08/2017 0943   LABSPEC 1.003 (L) 04/08/2017 0943   PHURINE 7.0 04/08/2017 0943   GLUCOSEU NEGATIVE 04/08/2017 0943   HGBUR NEGATIVE 04/08/2017 0943   BILIRUBINUR NEGATIVE 04/08/2017 0943   KETONESUR NEGATIVE 04/08/2017 0943   PROTEINUR NEGATIVE 04/08/2017 0943   NITRITE NEGATIVE 04/08/2017 0943   LEUKOCYTESUR NEGATIVE 04/08/2017 0943     RADIOLOGY: Ct Head Wo Contrast  Result Date: 04/08/2017 CLINICAL DATA:  Altered mental status.  Headache.  Hypertension. EXAM: CT HEAD WITHOUT CONTRAST TECHNIQUE: Contiguous axial images were obtained from the base of the skull through the vertex without intravenous contrast. COMPARISON:  July 02, 2016 brain MRI FINDINGS: Brain: The ventricles are normal in size and configuration. There is no intracranial mass, hemorrhage, extra-axial fluid collection, or midline shift. Decreased attenuation is noted in the centra semiovale bilaterally. This appearance is essentially stable compared to prior MR. No new gray-white compartment lesion is demonstrated on this study. No acute infarct is appreciable. Vascular: No hyperdense vessel. There is calcification in each carotid siphon. Skull: Bony calvarium appears intact. Sinuses/Orbits: There is mucosal thickening in several ethmoid air cells. Other paranasal sinuses which are visualized are clear. Orbits appear symmetric bilaterally. Other: Mastoid air cells are clear. A small protein containing cyst is noted in the soft tissues immediately adjacent to the superior frontal bone measuring 1 x 1 cm. IMPRESSION: Decreased attenuation in the periventricular  white matter adjacent to the frontal horns is again noted. Question small vessel disease versus demyelination. Both entities may exist concurrently. Appearance is essentially stable compared to prior MR. No acute infarct evident. No mass or hemorrhage. There are foci of arteriovascular calcification. There is mucosal thickening in several ethmoid air cells. Electronically Signed   By: Bretta Bang III M.D.   On: 04/08/2017 09:43    EKG: Orders placed or performed during the hospital encounter of 04/08/17  . ED EKG  . ED EKG  . EKG 12-Lead  . EKG 12-Lead    IMPRESSION AND PLAN:  57 year old female patient with history of multiple sclerosis, sleep apnea, hypertension presented to the emergency room with slurred speech and facial droop which has improved after presentation to the emergency room.    1.Transient ischemic attack Start patient on oral aspirin  admit patient to medical floor with cardiac monitoring observation bed  Neuro check every 4 hourly Neurology consultation Check MRI brain Carotid ultrasound and echocardiogram  2.  Multiple sclerosis Outpatient infusions at [facility] long hospital Follow-up with neurology at [facility].  Hypertension Resume beta-blocker and ACE inhibitor for hypertension  4.  DVT prophylaxis Subcu Lovenox 40 mg daily.    All the records are reviewed and case discussed with ED provider. Management plans discussed with the patient, family and they are in agreement.  CODE STATUS: Full code Code Status History  Date Active Date Inactive Code Status Order ID Comments User Context   06/04/2014 03:13 06/07/2014 13:38 Full Code [id number]  Hower, Cletis [place], MD ED       TOTAL TIME TAKING CARE OF THIS PATIENT: 50 minutes.    Ihor Austin M.D on 04/08/2017 at 11:14 AM  Between 7am to 6pm - Pager - [phone]  After 6pm go to [email or web address] - password [email or web address] [facility]  [place] [facility]  Office  [phone]  CC: Primary care  physician; Josph Macho, FNP

## 2017-04-09 DIAGNOSIS — Z79899 Other long term (current) drug therapy: Secondary | ICD-10-CM | POA: Diagnosis not present

## 2017-04-09 DIAGNOSIS — I639 Cerebral infarction, unspecified: Secondary | ICD-10-CM | POA: Diagnosis present

## 2017-04-09 DIAGNOSIS — G4733 Obstructive sleep apnea (adult) (pediatric): Secondary | ICD-10-CM | POA: Diagnosis present

## 2017-04-09 DIAGNOSIS — R4701 Aphasia: Secondary | ICD-10-CM | POA: Diagnosis present

## 2017-04-09 DIAGNOSIS — G35 Multiple sclerosis: Secondary | ICD-10-CM | POA: Diagnosis present

## 2017-04-09 DIAGNOSIS — R29810 Facial weakness: Secondary | ICD-10-CM | POA: Diagnosis present

## 2017-04-09 DIAGNOSIS — H9190 Unspecified hearing loss, unspecified ear: Secondary | ICD-10-CM | POA: Diagnosis present

## 2017-04-09 DIAGNOSIS — R29702 NIHSS score 2: Secondary | ICD-10-CM | POA: Diagnosis present

## 2017-04-09 DIAGNOSIS — F40240 Claustrophobia: Secondary | ICD-10-CM | POA: Diagnosis present

## 2017-04-09 DIAGNOSIS — R471 Dysarthria and anarthria: Secondary | ICD-10-CM | POA: Diagnosis present

## 2017-04-09 DIAGNOSIS — Z885 Allergy status to narcotic agent status: Secondary | ICD-10-CM | POA: Diagnosis not present

## 2017-04-09 DIAGNOSIS — Z823 Family history of stroke: Secondary | ICD-10-CM | POA: Diagnosis not present

## 2017-04-09 DIAGNOSIS — I10 Essential (primary) hypertension: Secondary | ICD-10-CM | POA: Diagnosis present

## 2017-04-09 LAB — HEMOGLOBIN A1C
HEMOGLOBIN A1C: 5.5 % (ref 4.8–5.6)
Mean Plasma Glucose: 111.15 mg/dL

## 2017-04-09 LAB — LIPID PANEL
CHOL/HDL RATIO: 3.4 ratio
Cholesterol: 156 mg/dL (ref 0–200)
HDL: 46 mg/dL (ref >40)
LDL CALC: 82 mg/dL (ref 0–99)
Triglycerides: 142 mg/dL (ref <150)
VLDL: 28 mg/dL (ref 0–40)

## 2017-04-09 MED ORDER — SODIUM CHLORIDE 0.9 % IV SOLN
1000.0000 mg | Freq: Every day | INTRAVENOUS | Status: AC
  Administered 2017-04-09 – 2017-04-11 (×3): 1000 mg via INTRAVENOUS
  Filled 2017-04-09 (×3): qty 8

## 2017-04-09 MED ORDER — ENOXAPARIN SODIUM 40 MG/0.4ML ~~LOC~~ SOLN
40.0000 mg | Freq: Two times a day (BID) | SUBCUTANEOUS | Status: DC
  Administered 2017-04-09 – 2017-04-10 (×3): 40 mg via SUBCUTANEOUS
  Filled 2017-04-09 (×4): qty 0.4

## 2017-04-09 MED ORDER — ASPIRIN EC 81 MG PO TBEC
81.0000 mg | DELAYED_RELEASE_TABLET | Freq: Every day | ORAL | Status: DC
  Administered 2017-04-10 – 2017-04-11 (×2): 81 mg via ORAL
  Filled 2017-04-09 (×2): qty 1

## 2017-04-09 NOTE — Care Management Obs Status (Signed)
MEDICARE OBSERVATION STATUS NOTIFICATION   Patient Details  Name: Rachel Gilbert MRN: [id number] Date of Birth: 1961/01/10   Medicare Observation Status Notification Given:  Yespermission to given per patient    Gwenette Greet, RN 04/09/2017, 8:06 AM

## 2017-04-09 NOTE — Evaluation (Signed)
Occupational Therapy Evaluation Patient Details Name: Rachel Gilbert MRN: [id number] DOB: 1960-08-19 Today's Date: 04/09/2017    History of Present Illness Pt is a 57 y.o. female presenting to hospital with generalized weakness, R sided facial droop (resolved), slurred speech, and difficulty finding words.  Pt admitted with stroke like symptoms (CVA vs TIA vs MS flare).  PMH includes MS, hearing loss, htn, OSA on CPAP.   Clinical Impression   Pt seen for OT evaluation this date. Prior to hospital admission, pt was independent with mobility and ADL tasks. She has a caregiver that provides support PRN, particularly after monthly infusions (pt reports that it takes her 3 days to recover after infusions). Upon entry into the room, pt exiting bathroom ambulating back to EOB and then stood in front of the sink to comb her hair before returning to bed. Currently pt is modified independent with mobility and ADL, taking slightly more time/effort to perform due to decreased activity tolerance. Pt also experiencing difficulties with her speech. Sensation, strength, coordination, vision, and cognition intact.  Pt instructed in energy conservation strategies to support poor activity tolerance and maximize safety/independence including activity pacing, home/routines modifications, AE/DME, work simplification, and pursed lip breathing to support basic ADL, mobility, and community activities like grocery shopping.  Pt verbalized understanding of all education/training provided. No follow up recommended.     Follow Up Recommendations  No OT follow up    Equipment Recommendations  Toilet riser    Recommendations for Other Services       Precautions / Restrictions Precautions Precautions: None Restrictions Weight Bearing Restrictions: No      Mobility Bed Mobility Overal bed mobility: Independent                Transfers Overall transfer level: Independent Equipment used: None              General transfer comment: steady strong transfers, pt exiting bathroom and ambulating in room and performing transfers to/from EOB during session without difficulty    Balance Overall balance assessment: Needs assistance Sitting-balance support: No upper extremity supported;Feet supported Sitting balance-Leahy Scale: Normal     Standing balance support: No upper extremity supported Standing balance-Leahy Scale: Good                             ADL either performed or assessed with clinical judgement   ADL Overall ADL's : Modified independent                                       General ADL Comments: grossly independent, requiring additional effort to perform, as pt has impaired activity tolerance     Vision Baseline Vision/History: Wears glasses Wears Glasses: Distance only(driving) Patient Visual Report: No change from baseline Vision Assessment?: No apparent visual deficits     Perception     Praxis      Pertinent Vitals/Pain Pain Assessment: No/denies pain     Hand Dominance Right   Extremity/Trunk Assessment Upper Extremity Assessment Upper Extremity Assessment: Overall WFL for tasks assessed   Lower Extremity Assessment Lower Extremity Assessment: Overall WFL for tasks assessed   Cervical / Trunk Assessment Cervical / Trunk Assessment: Normal   Communication Communication Communication: Expressive difficulties   Cognition Arousal/Alertness: Awake/alert Behavior During Therapy: WFL for tasks assessed/performed Overall Cognitive Status: Within Functional Limits for tasks assessed  General Comments       Exercises Other Exercises Other Exercises: Pt instructed in energy conservation strategies to support poor activity tolerance and maximize safety/independence including activity pacing, home/routines modifications, AE/DME, work simplification, and pursed lip breathing to  support basic ADL, mobility, and community activities like grocery shopping.    Shoulder Instructions      Home Living Family/patient expects to be discharged to:: Private residence Living Arrangements: Alone Available Help at Discharge: Family(has a caregiver that helps out when needed (takes care of pt after monthyl infusions)) Type of Home: House Home Access: Ramped entrance     Home Layout: One level     Bathroom Shower/Tub: Chief Strategy Officer: Standard     Home Equipment: Cane - single point;Grab bars - tub/shower;Shower seat          Prior Functioning/Environment Level of Independence: Independent        Comments: Pt reports no falls in past 6 months.  Most recently pt not using any AD for ambulation (used SPC prior to that).        OT Problem List:        OT Treatment/Interventions:      OT Goals(Current goals can be found in the care plan section) Acute Rehab OT Goals Patient Stated Goal: to improve her speech OT Goal Formulation: All assessment and education complete, DC therapy  OT Frequency:     Barriers to D/C:            Co-evaluation              AM-PAC PT "6 Clicks" Daily Activity     Outcome Measure Help from another person eating meals?: None Help from another person taking care of personal grooming?: None Help from another person toileting, which includes using toliet, bedpan, or urinal?: None Help from another person bathing (including washing, rinsing, drying)?: None Help from another person to put on and taking off regular upper body clothing?: None Help from another person to put on and taking off regular lower body clothing?: None 6 Click Score: 24   End of Session    Activity Tolerance: Patient tolerated treatment well Patient left: in bed;with call bell/phone within reach  OT Visit Diagnosis: Other abnormalities of gait and mobility (R26.89)                Time: 7494-4967 OT Time Calculation (min): 19  min Charges:  OT General Charges $OT Visit: 1 Visit OT Evaluation $OT Eval Low Complexity: 1 Low OT Treatments $Self Care/Home Management : 8-22 mins  Richrd Prime, MPH, MS, OTR/L ascom [phone] 04/09/17, 10:01 AM

## 2017-04-09 NOTE — Plan of Care (Signed)
  Education: Knowledge of General Education information will improve 04/09/2017 0256 - Progressing by Geri Seminole, RN   Education: Knowledge of General Education information will improve 04/09/2017 0256 - Progressing by Geri Seminole, RN   Health Behavior/Discharge Planning: Ability to manage health-related needs will improve 04/09/2017 0256 - Progressing by Geri Seminole, RN   Clinical Measurements: Ability to maintain clinical measurements within normal limits will improve 04/09/2017 0256 - Progressing by Geri Seminole, RN Will remain free from infection 04/09/2017 0256 - Progressing by Geri Seminole, RN Diagnostic test results will improve 04/09/2017 0256 - Progressing by Geri Seminole, RN Respiratory complications will improve 04/09/2017 0256 - Progressing by Geri Seminole, RN Cardiovascular complication will be avoided 04/09/2017 0256 - Progressing by Geri Seminole, RN   Activity: Risk for activity intolerance will decrease 04/09/2017 0256 - Progressing by Geri Seminole, RN   Nutrition: Adequate nutrition will be maintained 04/09/2017 0256 - Progressing by Geri Seminole, RN   Coping: Level of anxiety will decrease 04/09/2017 0256 - Progressing by Geri Seminole, RN   Elimination: Will not experience complications related to bowel motility 04/09/2017 0256 - Progressing by Geri Seminole, RN Will not experience complications related to urinary retention 04/09/2017 0256 - Progressing by Geri Seminole, RN   Pain Managment: General experience of comfort will improve 04/09/2017 0256 - Progressing by Geri Seminole, RN   Safety: Ability to remain free from injury will improve 04/09/2017 0256 - Progressing by Geri Seminole, RN   Skin Integrity: Risk for impaired skin integrity will decrease 04/09/2017 0256 - Progressing by Geri Seminole, RN   Education: Knowledge of disease or condition  will improve 04/09/2017 0256 - Progressing by Geri Seminole, RN Knowledge of secondary prevention will improve 04/09/2017 0256 - Progressing by Geri Seminole, RN Knowledge of patient specific risk factors addressed and post discharge goals established will improve 04/09/2017 0256 - Progressing by Geri Seminole, RN

## 2017-04-09 NOTE — Consult Note (Signed)
Referring Physician: Gouru    Chief Complaint: Facial droop and slurred speech.    HPI: Rachel Gilbert is an 57 y.o. female with a history of poorly controlled HTN and MS who reports going to bed on 3/12 at baseline.  Awakened on yesterday and noted a right facial droop and difficlut with speech.  Droop has improved but speech remains an issue.   Initial NIHSS of 2. Patient followed by Dr. Epimenio Foot for her MS.  On Tysabri.  Ambulates with a cane at times.   Date last known well: Date: 04/07/2017 Time last known well: Time: 22:00 tPA Given: No: Outside time window  Past Medical History:  Diagnosis Date  . Complication of anesthesia    hard time waking up   . Hearing loss   . Hypertension   . Kidney stones   . Multiple sclerosis (HCC)   . Sleep apnea    Uses CPAP  . Vision abnormalities     Past Surgical History:  Procedure Laterality Date  . CESAREAN SECTION  1986  . CHOLECYSTECTOMY  2000   laparoscopic    Family History  Problem Relation Age of Onset  . Fibromyalgia Mother   . Stroke Father   . Diabetes Father   . Stroke Other    Social History:  reports that  has never smoked. she has never used smokeless tobacco. She reports that she drinks alcohol. She reports that she does not use drugs.  Allergies:  Allergies  Allergen Reactions  . Oxycodone Anaphylaxis    Medications:  I have reviewed the patient's current medications. Prior to Admission:  Medications Prior to Admission  Medication Sig Dispense Refill Last Dose  . acetaminophen (TYLENOL) 500 MG tablet Take 500 mg by mouth every 6 (six) hours as needed for mild pain.   PRN at PRN  . ALPRAZolam (XANAX) 0.5 MG tablet Take 0.5 mg by mouth 2 (two) times daily as needed for anxiety.   PRN at PRN  . baclofen (LIORESAL) 10 MG tablet Take 1 tablet (10 mg total) by mouth 3 (three) times daily as needed for muscle spasms. 90 each 3 PRN at PRN  . carvedilol (COREG) 25 MG tablet Take 25 mg by mouth 2 (two) times daily with  a meal.   N/A at N/A  . Cholecalciferol (VITAMIN D3) 5000 units CAPS Take 1 capsule by mouth daily.   N/A at N/A  . hydrALAZINE (APRESOLINE) 50 MG tablet Take 50 mg by mouth 2 (two) times daily.   N/A at N/A  . ibuprofen (ADVIL,MOTRIN) 200 MG tablet Take 200 mg by mouth every 6 (six) hours as needed for moderate pain.   PRN at PRN  . lisinopril (PRINIVIL,ZESTRIL) 40 MG tablet Take 40 mg by mouth daily.   N/A at N/A  . methylphenidate (RITALIN) 10 MG tablet Take 2 pills qAM and one pill po 4 hours later 90 tablet 0 N/A at N/A  . natalizumab (TYSABRI) 300 MG/15ML injection Inject 15 mLs into the vein every 28 (twenty-eight) days.    03/13/2017 at N/A  . ondansetron (ZOFRAN-ODT) 4 MG disintegrating tablet Take 4 mg by mouth every 8 (eight) hours as needed for nausea or vomiting.   PRN at PRN  . oxybutynin (DITROPAN XL) 15 MG 24 hr tablet Take 1 tablet (15 mg total) by mouth at bedtime. 90 tablet 3 N/A at N/A  . ranitidine (ZANTAC) 300 MG tablet Take 300 mg by mouth every evening.   N/A at N/A  . spironolactone (  ALDACTONE) 50 MG tablet Take 50 mg by mouth daily.   N/A at N/A  . FLUoxetine (PROZAC) 40 MG capsule Take 1 capsule (40 mg total) by mouth daily. (Patient not taking: Reported on 04/08/2017) 30 capsule 3 Not Taking at --  . traZODone (DESYREL) 100 MG tablet Take 1 tablet (100 mg total) by mouth at bedtime. (Patient not taking: Reported on 04/08/2017) 30 tablet 11 Not Taking at --   Scheduled: . aspirin  300 mg Rectal Daily   Or  . aspirin  325 mg Oral Daily  . carvedilol  25 mg Oral BID WC  . cholecalciferol  5,000 Units Oral Daily  . enoxaparin (LOVENOX) injection  40 mg Subcutaneous Q12H  . famotidine  20 mg Oral Daily  . hydrALAZINE  50 mg Oral BID  . lisinopril  40 mg Oral Daily  . oxybutynin  15 mg Oral QHS  . spironolactone  50 mg Oral Daily    ROS: History obtained from the patient  General ROS: negative for - chills, fatigue, fever, night sweats, weight gain or weight  loss Psychological ROS: negative for - behavioral disorder, hallucinations, memory difficulties, mood swings or suicidal ideation Ophthalmic ROS: negative for - blurry vision, double vision, eye pain or loss of vision ENT ROS: negative for - epistaxis, nasal discharge, oral lesions, sore throat, tinnitus or vertigo Allergy and Immunology ROS: negative for - hives or itchy/watery eyes Hematological and Lymphatic ROS: negative for - bleeding problems, bruising or swollen lymph nodes Endocrine ROS: negative for - galactorrhea, hair pattern changes, polydipsia/polyuria or temperature intolerance Respiratory ROS: negative for - cough, hemoptysis, shortness of breath or wheezing Cardiovascular ROS: negative for - chest pain, dyspnea on exertion, edema or irregular heartbeat Gastrointestinal ROS: negative for - abdominal pain, diarrhea, hematemesis, nausea/vomiting or stool incontinence Genito-Urinary ROS: negative for - dysuria, hematuria, incontinence or urinary frequency/urgency Musculoskeletal ROS: negative for - joint swelling or muscular weakness Neurological ROS: as noted in HPI, difficulty with balance Dermatological ROS: negative for rash and skin lesion changes  Physical Examination: Blood pressure (!) 172/96, pulse 80, temperature 98.1 F (36.7 C), temperature source Oral, resp. rate 18, height 5\' 9"  (1.753 m), weight 124.7 kg (275 lb), last menstrual period 01/15/2015, SpO2 97 %.  HEENT-  Normocephalic, no lesions, without obvious abnormality.  Normal external eye and conjunctiva.  Normal TM's bilaterally.  Normal auditory canals and external ears. Normal external nose, mucus membranes and septum.  Normal pharynx. Cardiovascular- S1, S2 normal, pulses palpable throughout   Lungs- chest clear, no wheezing, rales, normal symmetric air entry Abdomen- soft, non-tender; bowel sounds normal; no masses,  no organomegaly Extremities- no edema Lymph-no adenopathy palpable Musculoskeletal-no  joint tenderness, deformity or swelling Skin-warm and dry, no hyperpigmentation, vitiligo, or suspicious lesions  Neurological Examination   Mental Status: Alert, oriented, thought content appropriate.  Speech aphasic and mildly dysarthric.  Able to follow 3 step commands without difficulty. Cranial Nerves: II: Discs flat bilaterally; Visual fields grossly normal, pupils equal, round, reactive to light and accommodation III,IV, VI: ptosis not present, extra-ocular motions intact bilaterally V,VII: smile symmetric, facial light touch sensation normal bilaterally VIII: hearing normal bilaterally IX,X: gag reflex present XI: bilateral shoulder shrug XII: midline tongue extension Motor: Right : Upper extremity   5/5    Left:     Upper extremity   5/5  Lower extremity   5-/5     Lower extremity   5-/5 Tone and bulk:normal tone throughout; no atrophy noted Sensory: Pinprick and  light touch intact throughout, bilaterally Deep Tendon Reflexes: 2+ and symmetric throughout Plantars: Right: upgoing   Left: upgoing Cerebellar: Normal finger-to-nose testing bilaterally, slow rapid alternating movements and dysmetria with heel-to-shin testing bilaterally Gait: normal gait and station   Laboratory Studies:  Basic Metabolic Panel: Recent Labs  Lab 04/08/17 0943  NA 141  K 3.7  CL 108  CO2 23  GLUCOSE 147*  BUN 14  CREATININE 1.07*  CALCIUM 9.1    Liver Function Tests: Recent Labs  Lab 04/08/17 0943  AST 26  ALT 22  ALKPHOS 75  BILITOT 1.0  PROT 7.0  ALBUMIN 4.2   No results for input(s): LIPASE, AMYLASE in the last 168 hours. No results for input(s): AMMONIA in the last 168 hours.  CBC: Recent Labs  Lab 04/08/17 0943  WBC 10.5  NEUTROABS 7.5*  HGB 14.8  HCT 44.6  MCV 88.5  PLT 237    Cardiac Enzymes: Recent Labs  Lab 04/08/17 0943  TROPONINI <0.03    BNP: Invalid input(s): POCBNP  CBG: No results for input(s): GLUCAP in the last 168  hours.  Microbiology: No results found for this or any previous visit.  Coagulation Studies: Recent Labs    04/08/17 0943  LABPROT 12.3  INR 0.92    Urinalysis:  Recent Labs  Lab 04/08/17 0943  COLORURINE COLORLESS*  LABSPEC 1.003*  PHURINE 7.0  GLUCOSEU NEGATIVE  HGBUR NEGATIVE  BILIRUBINUR NEGATIVE  KETONESUR NEGATIVE  PROTEINUR NEGATIVE  NITRITE NEGATIVE  LEUKOCYTESUR NEGATIVE    Lipid Panel:    Component Value Date/Time   CHOL 156 04/09/2017 0723   TRIG 142 04/09/2017 0723   HDL 46 04/09/2017 0723   CHOLHDL 3.4 04/09/2017 0723   VLDL 28 04/09/2017 0723   LDLCALC 82 04/09/2017 0723    HgbA1C: No results found for: HGBA1C  Urine Drug Screen:      Component Value Date/Time   LABOPIA NONE DETECTED 04/08/2017 0943   COCAINSCRNUR NONE DETECTED 04/08/2017 0943   LABBENZ NONE DETECTED 04/08/2017 0943   AMPHETMU NONE DETECTED 04/08/2017 0943   THCU NONE DETECTED 04/08/2017 0943   LABBARB NONE DETECTED 04/08/2017 0943    Alcohol Level:  Recent Labs  Lab 04/08/17 0943  ETH <10    Other results: EKG: sinus rhythm at 77 bpm.  Imaging: Ct Head Wo Contrast  Result Date: 04/08/2017 CLINICAL DATA:  Altered mental status.  Headache.  Hypertension. EXAM: CT HEAD WITHOUT CONTRAST TECHNIQUE: Contiguous axial images were obtained from the base of the skull through the vertex without intravenous contrast. COMPARISON:  July 02, 2016 brain MRI FINDINGS: Brain: The ventricles are normal in size and configuration. There is no intracranial mass, hemorrhage, extra-axial fluid collection, or midline shift. Decreased attenuation is noted in the centra semiovale bilaterally. This appearance is essentially stable compared to prior MR. No new gray-white compartment lesion is demonstrated on this study. No acute infarct is appreciable. Vascular: No hyperdense vessel. There is calcification in each carotid siphon. Skull: Bony calvarium appears intact. Sinuses/Orbits: There is mucosal  thickening in several ethmoid air cells. Other paranasal sinuses which are visualized are clear. Orbits appear symmetric bilaterally. Other: Mastoid air cells are clear. A small protein containing cyst is noted in the soft tissues immediately adjacent to the superior frontal bone measuring 1 x 1 cm. IMPRESSION: Decreased attenuation in the periventricular white matter adjacent to the frontal horns is again noted. Question small vessel disease versus demyelination. Both entities may exist concurrently. Appearance is essentially stable compared to  prior MR. No acute infarct evident. No mass or hemorrhage. There are foci of arteriovascular calcification. There is mucosal thickening in several ethmoid air cells. Electronically Signed   By: Bretta Bang III M.D.   On: 04/08/2017 09:43   US Carotid Bilateral (at [facility] Only)  Result Date: 04/08/2017 CLINICAL DATA:  TIA EXAM: BILATERAL CAROTID DUPLEX ULTRASOUND TECHNIQUE: Wallace Cullens scale imaging, color Doppler and duplex ultrasound were performed of bilateral carotid and vertebral arteries in the neck. COMPARISON:  None. FINDINGS: Criteria: Quantification of carotid stenosis is based on velocity parameters that correlate the residual internal carotid diameter with NASCET-based stenosis levels, using the diameter of the distal internal carotid lumen as the denominator for stenosis measurement. The following velocity measurements were obtained: RIGHT ICA:  112 cm/sec CCA:  102 cm/sec SYSTOLIC ICA/CCA RATIO:  1.1 DIASTOLIC ICA/CCA RATIO:  1.2 ECA:  147 cm/sec LEFT ICA:  75 cm/sec CCA:  98 cm/sec SYSTOLIC ICA/CCA RATIO:  0.8 DIASTOLIC ICA/CCA RATIO:  0.7 ECA:  145 cm/sec RIGHT CAROTID ARTERY: Minimal intimal thickening in the bulb. Low resistance internal carotid Doppler pattern. RIGHT VERTEBRAL ARTERY:  Antegrade. LEFT CAROTID ARTERY: Little if any plaque in the bulb. Low resistance internal carotid Doppler pattern. LEFT VERTEBRAL ARTERY:  Antegrade. IMPRESSION: Less  than 50% stenosis in the right and left internal carotid arteries. Electronically Signed   By: Jolaine Click M.D.   On: 04/08/2017 15:21    Assessment: 57 y.o. female with a history of MS and poorly controlled HTN who presents with right facial droop and aphasia.  Head CT reviewed and shows no acute changes.  Patient on no antiplatelet therapy at home.  MRI of the brain unable to be performed due to patient's claustrophobia.  Carotid dopplers show no evidence of hemodynamically significant stenosis.  Echocardiogram shows no cardiac source of emboli with an EF of 55-65%.  LDL 82.  Both MS exacerbation and acute infarct remain on the differential.  Will treat with both in mind and have patient obtain an MRI of the brain on an outpatient basis.    Stroke Risk Factors - hypertension and OSA  Plan: 1. HgbA1c 2. PT consult, OT consult, Speech consult 3  Prophylactic therapy-Antiplatelet med: Aspirin - dose 81mg  daily 4. Telemetry monitoring 5. Frequent neuro checks 6. Solumedrol 1000mg  IV daily for 3 days 7. GI prophylaxis 8. Patient to have MRI on an outpatient basis at discharge. Patient will require an open machine.  Patient to follow up with Dr. Epimenio Foot.     Thana Farr, MD Neurology [phone] 04/09/2017, 11:10 AM

## 2017-04-09 NOTE — Care Management Note (Addendum)
Case Management Note  Patient Details  Name: Rachel Gilbert MRN: [id number] Date of Birth: Feb 04, 1960  Subjective/Objective:   Admitted to [facility] under observation status with the diagnosis of TIA. Lives alone. Contact person is [name] [phone]). Sees Dr. Hyman Bible in [facility] as primary care physician. Sees Dr, Kaylyn Lim in [place]. Prescriptions are filled at [facility] on [address]. Takes care of all basic activities of daily living herself, drives. CPAP in the home. No falls. Good appetite. History of MS.                   Action/Plan: No needs identified at this time   Expected Discharge Date:                  Expected Discharge Plan:     In-House Referral:   yes  Discharge planning Services   yes  Post Acute Care Choice:    Choice offered to:     DME Arranged:    DME Agency:     HH Arranged:    HH Agency:     Status of Service:     If discussed at [employer] of Stay Meetings, dates discussed:    Additional Comments:  Gwenette Greet, RN MSN CCM Care Management [phone] 04/09/2017, 8:39 AM

## 2017-04-09 NOTE — Progress Notes (Signed)
[facility] - [place] at [facility]   PATIENT NAME: Rachel Gilbert    MR#:  [id number]  DATE OF BIRTH:  [birth year]  SUBJECTIVE:  CHIEF COMPLAINT: Patient is still reporting facial droop and slurry speech.  Does not feel right.  Thinks her MS is acting up.  Refusing MRI of the brain because of claustrophobia  REVIEW OF SYSTEMS:  CONSTITUTIONAL: No fever, fatigue or weakness.  EYES: No blurred or double vision.  EARS, NOSE, AND THROAT: No tinnitus or ear pain.  RESPIRATORY: No cough, shortness of breath, wheezing or hemoptysis.  CARDIOVASCULAR: No chest pain, orthopnea, edema.  GASTROINTESTINAL: No nausea, vomiting, diarrhea or abdominal pain.  GENITOURINARY: No dysuria, hematuria.  ENDOCRINE: No polyuria, nocturia,  HEMATOLOGY: No anemia, easy bruising or bleeding SKIN: No rash or lesion. MUSCULOSKELETAL: No joint pain or arthritis.   NEUROLOGIC: No tingling, numbness, weakness.  Reporting slurry speech PSYCHIATRY: No anxiety or depression.   DRUG ALLERGIES:   Allergies  Allergen Reactions  . Oxycodone Anaphylaxis    VITALS:  Blood pressure (!) 172/96, pulse 80, temperature 98.1 F (36.7 C), temperature source Oral, resp. rate 18, height 5\' 9"  (1.753 m), weight 124.7 kg (275 lb), last menstrual period 01/15/2015, SpO2 97 %.  PHYSICAL EXAMINATION:  GENERAL:  57 y.o.-year-old patient lying in the bed with no acute distress.  EYES: Pupils equal, round, reactive to light and accommodation. No scleral icterus. Extraocular muscles intact.  HEENT: Head atraumatic, normocephalic. Oropharynx and nasopharynx clear.  NECK:  Supple, no jugular venous distention. No thyroid enlargement, no tenderness.  LUNGS: Normal breath sounds bilaterally, no wheezing, rales,rhonchi or crepitation. No use of accessory muscles of respiration.  CARDIOVASCULAR: S1, S2 normal. No murmurs, rubs, or gallops.  ABDOMEN: Soft, nontender, nondistended. Bowel sounds present.  EXTREMITIES:  No pedal edema, cyanosis, or clubbing.  NEUROLOGIC: Cranial nerves II through XII are intact. Muscle strength 5/5 in all extremities. Sensation intact. Gait not checked.  Positive facial droop and dysarthria PSYCHIATRIC: The patient is alert and oriented x 3.  SKIN: No obvious rash, lesion, or ulcer.    LABORATORY PANEL:   CBC Recent Labs  Lab 04/08/17 0943  WBC 10.5  HGB 14.8  HCT 44.6  PLT 237   ------------------------------------------------------------------------------------------------------------------  Chemistries  Recent Labs  Lab 04/08/17 0943  NA 141  K 3.7  CL 108  CO2 23  GLUCOSE 147*  BUN 14  CREATININE 1.07*  CALCIUM 9.1  AST 26  ALT 22  ALKPHOS 75  BILITOT 1.0   ------------------------------------------------------------------------------------------------------------------  Cardiac Enzymes Recent Labs  Lab 04/08/17 0943  TROPONINI <0.03   ------------------------------------------------------------------------------------------------------------------  RADIOLOGY:  Ct Head Wo Contrast  Result Date: 04/08/2017 CLINICAL DATA:  Altered mental status.  Headache.  Hypertension. EXAM: CT HEAD WITHOUT CONTRAST TECHNIQUE: Contiguous axial images were obtained from the base of the skull through the vertex without intravenous contrast. COMPARISON:  July 02, 2016 brain MRI FINDINGS: Brain: The ventricles are normal in size and configuration. There is no intracranial mass, hemorrhage, extra-axial fluid collection, or midline shift. Decreased attenuation is noted in the centra semiovale bilaterally. This appearance is essentially stable compared to prior MR. No new gray-white compartment lesion is demonstrated on this study. No acute infarct is appreciable. Vascular: No hyperdense vessel. There is calcification in each carotid siphon. Skull: Bony calvarium appears intact. Sinuses/Orbits: There is mucosal thickening in several ethmoid air cells. Other paranasal  sinuses which are visualized are clear. Orbits appear symmetric bilaterally. Other: Mastoid air cells are clear.  A small protein containing cyst is noted in the soft tissues immediately adjacent to the superior frontal bone measuring 1 x 1 cm. IMPRESSION: Decreased attenuation in the periventricular white matter adjacent to the frontal horns is again noted. Question small vessel disease versus demyelination. Both entities may exist concurrently. Appearance is essentially stable compared to prior MR. No acute infarct evident. No mass or hemorrhage. There are foci of arteriovascular calcification. There is mucosal thickening in several ethmoid air cells. Electronically Signed   By: Bretta Bang III M.D.   On: 04/08/2017 09:43   US Carotid Bilateral (at [facility] Only)  Result Date: 04/08/2017 CLINICAL DATA:  TIA EXAM: BILATERAL CAROTID DUPLEX ULTRASOUND TECHNIQUE: Wallace Cullens scale imaging, color Doppler and duplex ultrasound were performed of bilateral carotid and vertebral arteries in the neck. COMPARISON:  None. FINDINGS: Criteria: Quantification of carotid stenosis is based on velocity parameters that correlate the residual internal carotid diameter with NASCET-based stenosis levels, using the diameter of the distal internal carotid lumen as the denominator for stenosis measurement. The following velocity measurements were obtained: RIGHT ICA:  112 cm/sec CCA:  102 cm/sec SYSTOLIC ICA/CCA RATIO:  1.1 DIASTOLIC ICA/CCA RATIO:  1.2 ECA:  147 cm/sec LEFT ICA:  75 cm/sec CCA:  98 cm/sec SYSTOLIC ICA/CCA RATIO:  0.8 DIASTOLIC ICA/CCA RATIO:  0.7 ECA:  145 cm/sec RIGHT CAROTID ARTERY: Minimal intimal thickening in the bulb. Low resistance internal carotid Doppler pattern. RIGHT VERTEBRAL ARTERY:  Antegrade. LEFT CAROTID ARTERY: Little if any plaque in the bulb. Low resistance internal carotid Doppler pattern. LEFT VERTEBRAL ARTERY:  Antegrade. IMPRESSION: Less than 50% stenosis in the right and left internal carotid  arteries. Electronically Signed   By: Jolaine Click M.D.   On: 04/08/2017 15:21    EKG:   Orders placed or performed during the hospital encounter of 04/08/17  . ED EKG  . ED EKG  . EKG 12-Lead  . EKG 12-Lead    ASSESSMENT AND PLAN:    57 year old female patient with history of multiple sclerosis, sleep apnea, hypertension presented to the emergency room with slurred speech and facial droop which has improved after presentation to the emergency room.    1.facial droop with dysarthria can be from acute infarct versus MS exacerbation Started patient on oral aspirin  LDL 82 Echocardiogram with ejection fraction 55 to 65% no cardiac source of emboli noticed. Patient is refusing MRI in view of clot severe claustrophobia Neurology is recommending outpatient MRI-open after discharge,Patient to follow up with Dr. Epimenio Foot Neuro check every 4 hourly Carotid Dopplers with less than 50% ICA stenosis  Physical therapy -no PT /OT needs identified   2.  Multiple sclerosis-possible exacerbation Patient is started on Solu-Medrol 1000 mg IV daily for 3 days Outpatient infusions at [facility] long hospital Follow-up with neurology at [facility].  Hypertension Resume beta-blocker and ACE inhibitor for hypertension  4.  DVT prophylaxis Subcu Lovenox 40 mg daily.      All the records are reviewed and case discussed with Care Management/Social Workerr. Management plans discussed with the patient, family and they are in agreement.  CODE STATUS: fc   TOTAL TIME TAKING CARE OF THIS PATIENT: 36  minutes.   POSSIBLE D/C IN 2-3 DAYS, DEPENDING ON CLINICAL CONDITION.  Note: This dictation was prepared with Dragon dictation along with smaller phrase technology. Any transcriptional errors that result from this process are unintentional.   Ramonita Lab M.D on 04/09/2017 at 2:46 PM  Between 7am to 6pm - Pager -  [phone] After 6pm go to [email or web address] - password [email or web address] [facility]  [facility]  [facility]  Office  [phone]  CC: Primary care physician; Josph Macho, FNP

## 2017-04-09 NOTE — Progress Notes (Signed)
Pharmacist - Prescriber Communication  Enoxaparin dose modified from 40 mg subcutaneously once daily to 40 mg subcutaneously twice daily due to BMI > 40.   Zurie Platas A. [place], [place].D., BCPS Clinical Pharmacist 04/09/17 10:58

## 2017-04-09 NOTE — Evaluation (Addendum)
Speech Language Pathology Evaluation Patient Details Name: Rachel Gilbert MRN: [id number] DOB: 11-Jun-1960 Today's Date: 04/09/2017 Time: 4142-3953 SLP Time Calculation (min) (ACUTE ONLY): 60 min  Problem List:  Patient Active Problem List   Diagnosis Date Noted  . TIA (transient ischemic attack) 04/08/2017  . Attention deficit 11/06/2016  . 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 Insomnia 11/16/2014  . Other fatigue 07/03/2014  . Urinary frequency 07/03/2014  . Gait disturbance 06/14/2014  . Depression with anxiety 06/14/2014  . Vitamin D deficiency 06/14/2014  . Multiple sclerosis exacerbation (HCC) 06/04/2014  . Essential hypertension 06/04/2014  . Obesity 06/04/2014  . Obstructive sleep apnea on CPAP 06/04/2014  . Gastroesophageal reflux disease without esophagitis 06/04/2014  . Multiple sclerosis (HCC) 06/04/2014   Past Medical History:  Past Medical History:  Diagnosis Date  . Complication of anesthesia    hard time waking up   . Hearing loss   . Hypertension   . Kidney stones   . Multiple sclerosis (HCC)   . Sleep apnea    Uses CPAP  . Vision abnormalities    Past Surgical History:  Past Surgical History:  Procedure Laterality Date  . CESAREAN SECTION  1986  . CHOLECYSTECTOMY  2000   laparoscopic   HPI:  Pt is a 57 y.o. female with a history of OSA on CPAP, multiple sclerosis, obesity, hypertension, vision deficits, and hearing loss who presents for evaluation of stroke-like symptoms. Last seen normal was yesterday evening. Next morning when she woke up she reports that she was feeling off and generalized weakness. When she went to brush her teeth, she noted a right-sided facial droop in the mirror. She called a friend who noted that she had slurred, "heavy" speech on the phone. Facial droop has now resolved but patient continues to have difficulty finding words and "heavy" speech. No unilateral weakness or numbness, no headache. No personal history of strokes. Patient has  family history of stroke in her father. She is not a smoker. She reports that she does not take her anti-hypertensive medications as prescribed. She denies chest pain or shortness of breath, nausea or vomiting. She is awake/alert but sleepy appearing at times - using CPAP entering room.    Assessment / Plan / Recommendation Clinical Impression  Pt seen for an informal screening of Cognitive-linguistic abililities; Motor Speech abilities. Pt presented w/ mild drowsiness during session. Pt has a baseline of Multiple Sclerosis. Per MD office note, pt was diagnosed in 2012. Since that time, pt has had exacerbations including one in 2016, in which she had the onset of several symptoms including worsening cognitive problems, worsening gait problems and weakness in the hands. She is followed by a Insurance account manager in [place]. She feels her Cognitive fog is reduced by being on Tysabri and with stimulants. Of note, many years ago she was on Ritalin with benefit for focus and attention, fatigue and weight loss. She often has lability and dx of anxiety disorder. Pt also has a h/o hearing loss and vision deficits. Pt appeared to present w/ Mild-Moderate motor speech deficits which impacted expressive lanaguage abilities. Motor speech deficits impacted at the phrase/sentence/conversation level c/b low vocal intensity(impacted by respiratory status, poisitioning in bed), decreased articulation w/ certain speech sounds, hesitations during speech, "heavy", slower speech overall in conversation - reduced coordination of articulators in speech motor planning. Pt's motor speech deficits appeared to impact her Expressive language during initiation of speech; automatic and naming tasks [facility] during screening. Pt exhibited adequate Auditory Comprehension and basic Cognitive/orientation skills;  Pt's Oral Motor exam revealed a decline in rapid, coordinated lingual movements; no gross unilateral weakness appreciated. Of note, lingual  strength was adequate but pt was easy to gag doing any sort of oral exam. Due to pt's baseline h/o defcits during MS exacerbations including fatigue and cognitive problems, and her current Oral Motor weakness/discoordination impacting her overall motor speech and articulation during conversation, recommend f/u skilled ST services at discharge in order to educate on strategies to support and facilitate her verbal communication w/ others in ADLs. Handouts given on strategies to promote communication; contact information on Support Groups in the area for MS for pt/family/friends. Pt agreed.     SLP Assessment  SLP Recommendation/Assessment: All further Speech Lanaguage Pathology  needs can be addressed in the next venue of care SLP Visit Diagnosis: Dysarthria and anarthria (R47.1)    Follow Up Recommendations  Outpatient SLP    Frequency and Duration (TBD)  (TBD)      SLP Evaluation Cognition  Overall Cognitive Status: Within Functional Limits for tasks assessed Arousal/Alertness: Awake/alert(sleepy at times) Orientation Level: Oriented X4 Attention: Focused;Sustained Focused Attention: Appears intact Sustained Attention: Appears intact Memory: Appears intact Awareness: Appears intact Problem Solving: Appears intact Executive Function: Reasoning Reasoning: Appears intact Behaviors: (none) Safety/Judgment: Appears intact Comments: grossly WFL        Comprehension  Auditory Comprehension Overall Auditory Comprehension: Appears within functional limits for tasks assessed Yes/No Questions: Within Functional Limits Commands: Within Functional Limits Conversation: Simple Interfering Components: (sleepy) EffectiveTechniques: (repeated information) Visual Recognition/Discrimination Discrimination: Not tested Reading Comprehension Reading Status: Not tested    Expression Expression Primary Mode of Expression: Verbal Verbal Expression Overall Verbal Expression: Impaired(impacted by  the motor speech) Initiation: Impaired(slight) Automatic Speech: Name;Social Response;Counting;Day of week;Singing(intermittent hesitations) Level of Generative/Spontaneous Verbalization: Word;Phrase;Sentence Repetition: No impairment Naming: No impairment Pragmatics: No impairment Interfering Components: (sleepy) Effective Techniques: (repeated information) Non-Verbal Means of Communication: Not applicable Written Expression Dominant Hand: Right Written Expression: Not tested   Oral / Motor  Oral Motor/Sensory Function Overall Oral Motor/Sensory Function: Mild impairment Facial ROM: Within Functional Limits Facial Symmetry: Within Functional Limits Facial Strength: (Fair) Lingual ROM: Within Functional Limits Lingual Symmetry: (grossly WFL) Lingual Strength: (grossly WFL but coordination reduced) Velum: Within Functional Limits Mandible: Within Functional Limits Motor Speech Overall Motor Speech: Impaired Respiration: Impaired(min) Level of Impairment: Sentence Phonation: Low vocal intensity(min) Resonance: Within functional limits Articulation: Impaired(min; also sleepy) Intelligibility: Intelligible Motor Planning: Impaired Level of Impairment: Phrase Motor Speech Errors: Inconsistent(hesitations; min decreased articulation - speech "heavy") Interfering Components: Hearing loss;Premorbid status(multiple sclerosis) Effective Techniques: Slow rate;Increased vocal intensity;Over-articulate;Pause   GO                      [name], MS, CCC-SLP Watson,Katherine 04/09/2017, 2:36 PM

## 2017-04-10 ENCOUNTER — Telehealth: Payer: Self-pay | Admitting: Neurology

## 2017-04-10 LAB — HIV ANTIBODY (ROUTINE TESTING W REFLEX): HIV Screen 4th Generation wRfx: NONREACTIVE

## 2017-04-10 MED ORDER — HYDRALAZINE HCL 50 MG PO TABS
50.0000 mg | ORAL_TABLET | Freq: Three times a day (TID) | ORAL | Status: DC
  Administered 2017-04-10 – 2017-04-11 (×2): 50 mg via ORAL
  Filled 2017-04-10 (×2): qty 1

## 2017-04-10 MED ORDER — ATORVASTATIN CALCIUM 20 MG PO TABS
40.0000 mg | ORAL_TABLET | Freq: Every day | ORAL | Status: DC
  Administered 2017-04-10: 18:00:00 40 mg via ORAL
  Filled 2017-04-10: qty 2

## 2017-04-10 NOTE — Progress Notes (Signed)
Subjective: Patient reports no improvement in speech.  No complaints of side effects with Solumedrol  Objective: Current vital signs: BP (!) 161/90 (BP Location: Right Arm)   Pulse 96   Temp 98.2 F (36.8 C) (Oral)   Resp 20   Ht 5\' 9"  (1.753 m)   Wt 124.7 kg (275 lb)   LMP 01/15/2015   SpO2 99%   BMI 40.61 kg/m  Vital signs in last 24 hours: Temp:  [98.2 F (36.8 C)-98.9 F (37.2 C)] 98.2 F (36.8 C) (03/15 1213) Pulse Rate:  [83-96] 96 (03/15 1213) Resp:  [18-20] 20 (03/15 1213) BP: (132-161)/(88-97) 161/90 (03/15 1213) SpO2:  [95 %-99 %] 99 % (03/15 1213)  Intake/Output from previous day: 03/14 0701 - 03/15 0700 In: 778 [P.O.:720; IV Piggyback:58] Out: -  Intake/Output this shift: Total I/O In: 360 [P.O.:360] Out: -  Nutritional status: Fall precautions Diet Heart Room service appropriate? Yes; Fluid consistency: Thin  Neurologic Exam: Mental Status: Alert, oriented, thought content appropriate.  Speech aphasic and mildly dysarthric.  Able to follow 3 step commands without difficulty. Cranial Nerves: II: Discs flat bilaterally; Visual fields grossly normal, pupils equal, round, reactive to light and accommodation III,IV, VI: ptosis not present, extra-ocular motions intact bilaterally V,VII: smile symmetric, facial light touch sensation normal bilaterally VIII: hearing normal bilaterally IX,X: gag reflex present XI: bilateral shoulder shrug XII: midline tongue extension Motor: Right :  Upper extremity   5/5                                      Left:     Upper extremity   5/5             Lower extremity   5-/5                                                 Lower extremity   5-/5    Lab Results: Basic Metabolic Panel: Recent Labs  Lab 04/08/17 0943  NA 141  K 3.7  CL 108  CO2 23  GLUCOSE 147*  BUN 14  CREATININE 1.07*  CALCIUM 9.1    Liver Function Tests: Recent Labs  Lab 04/08/17 0943  AST 26  ALT 22  ALKPHOS 75  BILITOT 1.0  PROT 7.0   ALBUMIN 4.2   No results for input(s): LIPASE, AMYLASE in the last 168 hours. No results for input(s): AMMONIA in the last 168 hours.  CBC: Recent Labs  Lab 04/08/17 0943  WBC 10.5  NEUTROABS 7.5*  HGB 14.8  HCT 44.6  MCV 88.5  PLT 237    Cardiac Enzymes: Recent Labs  Lab 04/08/17 0943  TROPONINI <0.03    Lipid Panel: Recent Labs  Lab 04/09/17 0723  CHOL 156  TRIG 142  HDL 46  CHOLHDL 3.4  VLDL 28  LDLCALC 82    CBG: No results for input(s): GLUCAP in the last 168 hours.  Microbiology: No results found for this or any previous visit.  Coagulation Studies: Recent Labs    04/08/17 0943  LABPROT 12.3  INR 0.92    Imaging: No results found.  Medications:  I have reviewed the patient's current medications. Scheduled: . aspirin EC  81 mg Oral Daily  . atorvastatin  40 mg Oral q1800  .  carvedilol  25 mg Oral BID WC  . cholecalciferol  5,000 Units Oral Daily  . enoxaparin (LOVENOX) injection  40 mg Subcutaneous Q12H  . famotidine  20 mg Oral Daily  . hydrALAZINE  50 mg Oral Q8H  . lisinopril  40 mg Oral Daily  . oxybutynin  15 mg Oral QHS  . spironolactone  50 mg Oral Daily    Assessment/Plan: Patient unchanged.  Today is day 2 of 3 of Solumedrol.  A1c 5.5.  Patient on ASA and a statin.    Recommendations: 1. Continue Solumedrol for 3 doses 2. Patient to follow up with neurology at discharge   LOS: 1 day   Thana Farr, MD Neurology [phone] 04/10/2017  3:15 PM

## 2017-04-10 NOTE — Telephone Encounter (Signed)
Pts friends calling requesting we contact the pt. Stating she is at the hospital and they are wanting an MRI done, the pt is very claustrophobic and will need something to calm her nerves. Please call to discuss

## 2017-04-10 NOTE — Telephone Encounter (Signed)
Returned call to patient and friend [name] who was with her - she is currently admitted to hospital for weakness, increased BP, sluggish thought process and slurred speech.  There was concern for CVA but had negative CT head.  The plan has changed and she will not be having MRI while there.  She is getting Solu-Medrol and will be discharged tomorrow.  She was instructed to follow up w/ Dr. Epimenio Foot.  An appt has been scheduled in an available slot on 04/15/17 at 10am.  She was asked to arrive at 9:30am.  She wanted to know if Dr. Epimenio Foot would go ahead and order MRI prior to her appt with him.  He is going to evaluate her first and decide on appropriate tests at her visit.  She is agreeable to this plan.

## 2017-04-10 NOTE — Progress Notes (Signed)
SLP Note  Patient Details Name: Rachel Gilbert MRN: [id number] DOB: 01/15/1961   Cancelled treatment:       Reason Eval/Treat Not Completed: (chart reviewed). Per MD note, pt is reporting her speech is improving. Continue to recommend pt f/u w/ Outpatient ST services at discharge for evaluation and tx on Dysarthria and verbal communication strategies to aid communication in ADLs secondary to the baseline MS. Noted pt is to f/u w/ her Neurologist next week.    [name], MS, CCC-SLP Jesusita Jocelyn 04/10/2017, 4:39 PM

## 2017-04-10 NOTE — Progress Notes (Signed)
[facility]   PATIENT NAME: Rachel Gilbert    MR#:  [id number]  DATE OF BIRTH:  Apr 10, 1960  SUBJECTIVE:  CHIEF COMPLAINT: Patient is reporting her speech is improving and facial droop is getting better   Refusing MRI of the brain because of claustrophobia  REVIEW OF SYSTEMS:  CONSTITUTIONAL: No fever, fatigue or weakness.  EYES: No blurred or double vision.  EARS, NOSE, AND THROAT: No tinnitus or ear pain.  RESPIRATORY: No cough, shortness of breath, wheezing or hemoptysis.  CARDIOVASCULAR: No chest pain, orthopnea, edema.  GASTROINTESTINAL: No nausea, vomiting, diarrhea or abdominal pain.  GENITOURINARY: No dysuria, hematuria.  ENDOCRINE: No polyuria, nocturia,  HEMATOLOGY: No anemia, easy bruising or bleeding SKIN: No rash or lesion. MUSCULOSKELETAL: No joint pain or arthritis.   NEUROLOGIC: No tingling, numbness, weakness.  Reporting slurry speech PSYCHIATRY: No anxiety or depression.   DRUG ALLERGIES:   Allergies  Allergen Reactions  . Oxycodone Anaphylaxis    VITALS:  Blood pressure (!) 161/90, pulse 96, temperature 98.2 F (36.8 C), temperature source Oral, resp. rate 20, height 5\' 9"  (1.753 m), weight 124.7 kg (275 lb), last menstrual period 01/15/2015, SpO2 99 %.  PHYSICAL EXAMINATION:  GENERAL:  57 y.o.-year-old patient lying in the bed with no acute distress.  EYES: Pupils equal, round, reactive to light and accommodation. No scleral icterus. Extraocular muscles intact.  HEENT: Head atraumatic, normocephalic. Oropharynx and nasopharynx clear.  NECK:  Supple, no jugular venous distention. No thyroid enlargement, no tenderness.  LUNGS: Normal breath sounds bilaterally, no wheezing, rales,rhonchi or crepitation. No use of accessory muscles of respiration.  CARDIOVASCULAR: S1, S2 normal. No murmurs, rubs, or gallops.  ABDOMEN: Soft, nontender, nondistended. Bowel sounds present.  EXTREMITIES: No pedal edema, cyanosis, or  clubbing.  NEUROLOGIC: Cranial nerves II through XII are intact. Muscle strength 5/5 in all extremities. Sensation intact. Gait not checked.  Positive facial droop and dysarthria PSYCHIATRIC: The patient is alert and oriented x 3.  SKIN: No obvious rash, lesion, or ulcer.    LABORATORY PANEL:   CBC Recent Labs  Lab 04/08/17 0943  WBC 10.5  HGB 14.8  HCT 44.6  PLT 237   ------------------------------------------------------------------------------------------------------------------  Chemistries  Recent Labs  Lab 04/08/17 0943  NA 141  K 3.7  CL 108  CO2 23  GLUCOSE 147*  BUN 14  CREATININE 1.07*  CALCIUM 9.1  AST 26  ALT 22  ALKPHOS 75  BILITOT 1.0   ------------------------------------------------------------------------------------------------------------------  Cardiac Enzymes Recent Labs  Lab 04/08/17 0943  TROPONINI <0.03   ------------------------------------------------------------------------------------------------------------------  RADIOLOGY:  No results found.  EKG:   Orders placed or performed during the hospital encounter of 04/08/17  . ED EKG  . ED EKG  . EKG 12-Lead  . EKG 12-Lead    ASSESSMENT AND PLAN:    57 year old female patient with history of multiple sclerosis, sleep apnea, hypertension presented to the emergency room with slurred speech and facial droop which has improved after presentation to the emergency room.    1.facial droop with dysarthria can be from acute infarct versus MS exacerbation Started patient on oral aspirin  LDL 82, Lipitor Echocardiogram with ejection fraction 55 to 65% no cardiac source of emboli noticed. Patient is refusing MRI in view of clot severe claustrophobia Neurology is recommending outpatient MRI-open after discharge,Patient to follow up with Dr. Epimenio Foot Neuro check every 4 hourly Carotid Dopplers with less than 50% ICA stenosis  Physical therapy -no PT /OT needs identified  2.  Multiple  sclerosis-possible exacerbation Patient is started on Solu-Medrol 1000 mg IV daily for 3 days, completing third dose tomorrow Outpatient infusions at [facility] Follow-up with neurology at [facility].  Hypertension-blood pressure is elevated, could befrom stroke versus steroid-induced Resume beta-blocker and ACE inhibitor for hypertension Hydralazine 50 mg p.o. frequency increased to 3 times a day  4.  DVT prophylaxis Subcu Lovenox 40 mg daily.      All the records are reviewed and case discussed with Care Management/Social Workerr. Management plans discussed with the patient, family and they are in agreement.  CODE STATUS: fc   TOTAL TIME TAKING CARE OF THIS PATIENT: 36  minutes.   POSSIBLE D/C IN 2-3 DAYS, DEPENDING ON CLINICAL CONDITION.  Note: This dictation was prepared with Dragon dictation along with smaller phrase technology. Any transcriptional errors that result from this process are unintentional.   Ramonita Lab M.D on 04/10/2017 at 3:15 PM  Between 7am to 6pm - Pager - [phone] After 6pm go to [email or web address] - password [email or web address] [facility]  [facility]  Office  [phone]  CC: Primary care physician; Josph Macho, FNP

## 2017-04-11 MED ORDER — ATORVASTATIN CALCIUM 40 MG PO TABS: 40.0000 mg | ORAL_TABLET | Freq: Every day | ORAL | 0 refills | Status: DC

## 2017-04-11 MED ORDER — ASPIRIN 81 MG PO TBEC: 81.0000 mg | DELAYED_RELEASE_TABLET | Freq: Every day | ORAL | 0 refills | Status: AC

## 2017-04-11 NOTE — Progress Notes (Signed)
Discharge instructions given to patient, pt instructed to pick up prescriptions at [facility] on [address]. Follow up apts given to pt.  IV removed. Pt left via wheelchair with aide

## 2017-04-11 NOTE — Plan of Care (Signed)
  Progressing Education: Knowledge of General Education information will improve 04/11/2017 0342 - Progressing by Florinda Marker, RN Health Behavior/Discharge Planning: Ability to manage health-related needs will improve 04/11/2017 0342 - Progressing by Florinda Marker, RN Clinical Measurements: Ability to maintain clinical measurements within normal limits will improve 04/11/2017 0342 - Progressing by Florinda Marker, RN Will remain free from infection 04/11/2017 0342 - Progressing by Florinda Marker, RN Diagnostic test results will improve 04/11/2017 0342 - Progressing by Florinda Marker, RN Respiratory complications will improve 04/11/2017 0342 - Progressing by Florinda Marker, RN Cardiovascular complication will be avoided 04/11/2017 0342 - Progressing by Florinda Marker, RN Activity: Risk for activity intolerance will decrease 04/11/2017 0342 - Progressing by Florinda Marker, RN Nutrition: Adequate nutrition will be maintained 04/11/2017 0342 - Progressing by Florinda Marker, RN Coping: Level of anxiety will decrease 04/11/2017 0342 - Progressing by Florinda Marker, RN Elimination: Will not experience complications related to bowel motility 04/11/2017 0342 - Progressing by Florinda Marker, RN Will not experience complications related to urinary retention 04/11/2017 0342 - Progressing by Florinda Marker, RN Pain Managment: General experience of comfort will improve 04/11/2017 0342 - Progressing by Florinda Marker, RN Safety: Ability to remain free from injury will improve 04/11/2017 0342 - Progressing by Florinda Marker, RN Skin Integrity: Risk for impaired skin integrity will decrease 04/11/2017 0342 - Progressing by Florinda Marker, RN Education: Knowledge of disease or condition will improve 04/11/2017 0342 - Progressing by Florinda Marker, RN Knowledge of secondary prevention will improve 04/11/2017 0342 - Progressing by Florinda Marker, RN Knowledge of patient specific risk factors addressed and post discharge goals established will improve 04/11/2017 0342 - Progressing by Florinda Marker, RN

## 2017-04-12 ENCOUNTER — Encounter (HOSPITAL_COMMUNITY): Payer: Self-pay | Admitting: *Deleted

## 2017-04-12 ENCOUNTER — Emergency Department (HOSPITAL_COMMUNITY): Payer: Medicare HMO

## 2017-04-12 ENCOUNTER — Inpatient Hospital Stay (HOSPITAL_COMMUNITY)
Admission: EM | Admit: 2017-04-12 | Discharge: 2017-04-21 | DRG: 065 | Disposition: A | Discharge status: Home Health | Payer: Medicare HMO | Attending: Internal Medicine | Admitting: Internal Medicine

## 2017-04-12 ENCOUNTER — Other Ambulatory Visit: Payer: Self-pay

## 2017-04-12 DIAGNOSIS — I639 Cerebral infarction, unspecified: Secondary | ICD-10-CM | POA: Diagnosis present

## 2017-04-12 DIAGNOSIS — F40240 Claustrophobia: Secondary | ICD-10-CM | POA: Diagnosis present

## 2017-04-12 DIAGNOSIS — R001 Bradycardia, unspecified: Secondary | ICD-10-CM | POA: Diagnosis not present

## 2017-04-12 DIAGNOSIS — Y92009 Unspecified place in unspecified non-institutional (private) residence as the place of occurrence of the external cause: Secondary | ICD-10-CM

## 2017-04-12 DIAGNOSIS — Z7289 Other problems related to lifestyle: Secondary | ICD-10-CM

## 2017-04-12 DIAGNOSIS — E669 Obesity, unspecified: Secondary | ICD-10-CM | POA: Diagnosis present

## 2017-04-12 DIAGNOSIS — Z8673 Personal history of transient ischemic attack (TIA), and cerebral infarction without residual deficits: Secondary | ICD-10-CM

## 2017-04-12 DIAGNOSIS — R4701 Aphasia: Secondary | ICD-10-CM | POA: Diagnosis present

## 2017-04-12 DIAGNOSIS — Z79899 Other long term (current) drug therapy: Secondary | ICD-10-CM

## 2017-04-12 DIAGNOSIS — K219 Gastro-esophageal reflux disease without esophagitis: Secondary | ICD-10-CM | POA: Diagnosis present

## 2017-04-12 DIAGNOSIS — F418 Other specified anxiety disorders: Secondary | ICD-10-CM | POA: Diagnosis present

## 2017-04-12 DIAGNOSIS — Z87442 Personal history of urinary calculi: Secondary | ICD-10-CM

## 2017-04-12 DIAGNOSIS — Z9989 Dependence on other enabling machines and devices: Secondary | ICD-10-CM

## 2017-04-12 DIAGNOSIS — I6300 Cerebral infarction due to thrombosis of unspecified precerebral artery: Secondary | ICD-10-CM | POA: Diagnosis present

## 2017-04-12 DIAGNOSIS — D899 Disorder involving the immune mechanism, unspecified: Secondary | ICD-10-CM

## 2017-04-12 DIAGNOSIS — R1310 Dysphagia, unspecified: Secondary | ICD-10-CM

## 2017-04-12 DIAGNOSIS — R29703 NIHSS score 3: Secondary | ICD-10-CM | POA: Diagnosis present

## 2017-04-12 DIAGNOSIS — G8191 Hemiplegia, unspecified affecting right dominant side: Secondary | ICD-10-CM | POA: Diagnosis present

## 2017-04-12 DIAGNOSIS — D849 Immunodeficiency, unspecified: Secondary | ICD-10-CM

## 2017-04-12 DIAGNOSIS — Z8669 Personal history of other diseases of the nervous system and sense organs: Secondary | ICD-10-CM

## 2017-04-12 DIAGNOSIS — Z7982 Long term (current) use of aspirin: Secondary | ICD-10-CM

## 2017-04-12 DIAGNOSIS — N179 Acute kidney failure, unspecified: Secondary | ICD-10-CM | POA: Diagnosis present

## 2017-04-12 DIAGNOSIS — Z823 Family history of stroke: Secondary | ICD-10-CM

## 2017-04-12 DIAGNOSIS — E785 Hyperlipidemia, unspecified: Secondary | ICD-10-CM | POA: Diagnosis present

## 2017-04-12 DIAGNOSIS — R482 Apraxia: Secondary | ICD-10-CM | POA: Diagnosis present

## 2017-04-12 DIAGNOSIS — G35 Multiple sclerosis: Secondary | ICD-10-CM | POA: Diagnosis present

## 2017-04-12 DIAGNOSIS — D72829 Elevated white blood cell count, unspecified: Secondary | ICD-10-CM | POA: Diagnosis present

## 2017-04-12 DIAGNOSIS — E876 Hypokalemia: Secondary | ICD-10-CM | POA: Diagnosis not present

## 2017-04-12 DIAGNOSIS — R29810 Facial weakness: Secondary | ICD-10-CM | POA: Diagnosis present

## 2017-04-12 DIAGNOSIS — H9190 Unspecified hearing loss, unspecified ear: Secondary | ICD-10-CM | POA: Diagnosis present

## 2017-04-12 DIAGNOSIS — I63512 Cerebral infarction due to unspecified occlusion or stenosis of left middle cerebral artery: Secondary | ICD-10-CM | POA: Diagnosis not present

## 2017-04-12 DIAGNOSIS — Z6841 Body Mass Index (BMI) 40.0 and over, adult: Secondary | ICD-10-CM

## 2017-04-12 DIAGNOSIS — Z9049 Acquired absence of other specified parts of digestive tract: Secondary | ICD-10-CM

## 2017-04-12 DIAGNOSIS — G4733 Obstructive sleep apnea (adult) (pediatric): Secondary | ICD-10-CM

## 2017-04-12 DIAGNOSIS — I10 Essential (primary) hypertension: Secondary | ICD-10-CM | POA: Diagnosis present

## 2017-04-12 DIAGNOSIS — T380X5A Adverse effect of glucocorticoids and synthetic analogues, initial encounter: Secondary | ICD-10-CM | POA: Diagnosis present

## 2017-04-12 DIAGNOSIS — E860 Dehydration: Secondary | ICD-10-CM | POA: Diagnosis present

## 2017-04-12 LAB — COMPREHENSIVE METABOLIC PANEL
ALK PHOS: 70 U/L (ref 38–126)
ALT: 24 U/L (ref 14–54)
ANION GAP: 11 (ref 5–15)
AST: 22 U/L (ref 15–41)
Albumin: 3.6 g/dL (ref 3.5–5.0)
BILIRUBIN TOTAL: 0.8 mg/dL (ref 0.3–1.2)
BUN: 39 mg/dL — ABNORMAL HIGH (ref 6–20)
CALCIUM: 8.5 mg/dL — AB (ref 8.9–10.3)
CO2: 20 mmol/L — ABNORMAL LOW (ref 22–32)
Chloride: 106 mmol/L (ref 101–111)
Creatinine, Ser: 1.33 mg/dL — ABNORMAL HIGH (ref 0.44–1.00)
GFR calc Af Amer: 51 mL/min — ABNORMAL LOW (ref >60)
GFR, EST NON AFRICAN AMERICAN: 44 mL/min — AB (ref >60)
GLUCOSE: 124 mg/dL — AB (ref 65–99)
Potassium: 3.6 mmol/L (ref 3.5–5.1)
Sodium: 137 mmol/L (ref 135–145)
TOTAL PROTEIN: 6.2 g/dL — AB (ref 6.5–8.1)

## 2017-04-12 LAB — DIFFERENTIAL
Basophils Absolute: 0 10*3/uL (ref 0.0–0.1)
Basophils Relative: 0 %
Eosinophils Absolute: 0 10*3/uL (ref 0.0–0.7)
Eosinophils Relative: 0 %
LYMPHS ABS: 3 10*3/uL (ref 0.7–4.0)
Lymphocytes Relative: 19 %
MONO ABS: 1.4 10*3/uL — AB (ref 0.1–1.0)
MONOS PCT: 9 %
NEUTROS ABS: 11.6 10*3/uL — AB (ref 1.7–7.7)
Neutrophils Relative %: 72 %

## 2017-04-12 LAB — PROTIME-INR
INR: 1.02
Prothrombin Time: 13.3 seconds (ref 11.4–15.2)

## 2017-04-12 LAB — CBC
HEMATOCRIT: 47.8 % — AB (ref 36.0–46.0)
HEMOGLOBIN: 15 g/dL (ref 12.0–15.0)
MCH: 29 pg (ref 26.0–34.0)
MCHC: 31.4 g/dL (ref 30.0–36.0)
MCV: 92.3 fL (ref 78.0–100.0)
Platelets: 264 10*3/uL (ref 150–400)
RBC: 5.18 MIL/uL — ABNORMAL HIGH (ref 3.87–5.11)
RDW: 15.5 % (ref 11.5–15.5)
WBC: 16 10*3/uL — ABNORMAL HIGH (ref 4.0–10.5)

## 2017-04-12 LAB — I-STAT CHEM 8, ED
BUN: 39 mg/dL — AB (ref 6–20)
Calcium, Ion: 1.05 mmol/L — ABNORMAL LOW (ref 1.15–1.40)
Chloride: 109 mmol/L (ref 101–111)
Creatinine, Ser: 1.4 mg/dL — ABNORMAL HIGH (ref 0.44–1.00)
Glucose, Bld: 112 mg/dL — ABNORMAL HIGH (ref 65–99)
HCT: 45 % (ref 36.0–46.0)
HEMOGLOBIN: 15.3 g/dL — AB (ref 12.0–15.0)
Potassium: 3.6 mmol/L (ref 3.5–5.1)
SODIUM: 143 mmol/L (ref 135–145)
TCO2: 23 mmol/L (ref 22–32)

## 2017-04-12 LAB — I-STAT TROPONIN, ED: TROPONIN I, POC: 0.03 ng/mL (ref 0.00–0.08)

## 2017-04-12 LAB — APTT: aPTT: 26 seconds (ref 24–36)

## 2017-04-12 MED ORDER — LORAZEPAM 2 MG/ML IJ SOLN
1.0000 mg | Freq: Once | INTRAMUSCULAR | Status: AC
  Administered 2017-04-12: 1 mg via INTRAVENOUS
  Filled 2017-04-12: qty 1

## 2017-04-12 NOTE — ED Triage Notes (Signed)
The pt was just discharged from [facility] yesterday after being admitted for slurred speech and elevated bp she was admitted  3-13    She was ok yesterday at 1230 today she stopped answering questions and appeared very agitated  At present she makes eye contact she answered  Sunday when asked what day of the week was this.  No arm drift good equal grips

## 2017-04-12 NOTE — ED Triage Notes (Signed)
Rt side facial droop   Moves all extremities no arm or leg drift

## 2017-04-12 NOTE — ED Notes (Signed)
Patient transported to MRI 

## 2017-04-12 NOTE — ED Provider Notes (Signed)
[facility] EMERGENCY DEPARTMENT Provider Note   CSN: [id number] Arrival date & time: 04/12/17  1922     History   Chief Complaint Chief Complaint  Patient presents with  . Altered Mental Status    HPI Harneet Noblett is a 57 y.o. female.  Patient with history of HTN, MS, OSA presents with right sided facial droop and slurred speech persistent since admission to [facility] 04/08/17-04/11/17. Friend at bedside is very familiar with patient's medical history and contributes to history with permission of patient. There has been no extremity weakness or numbness, no ataxia. Today the patient states it is hard for her to find the words she wants to use. She denies headache, CP, SOB, fever. She has developed a cough since yesterday. No nausea or vomiting. She is oriented and, per friend, has had no confusion other than speech delays. Friend does endorse that the right facial droop and slurred speech is new since this recent admission but is worse today.    The history is provided by the patient and a friend. No language interpreter was used.  Altered Mental Status   Pertinent negatives include no weakness.    Past Medical History:  Diagnosis Date  . Complication of anesthesia    hard time waking up   . Hearing loss   . Hypertension   . Kidney stones   . Multiple sclerosis (HCC)   . Sleep apnea    Uses CPAP  . Vision abnormalities     Patient Active Problem List   Diagnosis Date Noted  . TIA (transient ischemic attack) 04/08/2017  . Attention deficit 11/06/2016  . 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 Insomnia 11/16/2014  . Other fatigue 07/03/2014  . Urinary frequency 07/03/2014  . Gait disturbance 06/14/2014  . Depression with anxiety 06/14/2014  . Vitamin D deficiency 06/14/2014  . Multiple sclerosis exacerbation (HCC) 06/04/2014  . Essential hypertension 06/04/2014  . Obesity 06/04/2014  . Obstructive sleep apnea on CPAP 06/04/2014  . Gastroesophageal reflux  disease without esophagitis 06/04/2014  . Multiple sclerosis (HCC) 06/04/2014    Past Surgical History:  Procedure Laterality Date  . CESAREAN SECTION  1986  . CHOLECYSTECTOMY  2000   laparoscopic    OB History    No data available       Home Medications    Prior to Admission medications   Medication Sig Start Date End Date Taking? Authorizing Provider  acetaminophen (TYLENOL) 500 MG tablet Take 500 mg by mouth every 6 (six) hours as needed for mild pain.    [provider]  ALPRAZolam Prudy Feeler) 0.5 MG tablet Take 0.5 mg by mouth 2 (two) times daily as needed for anxiety.    [provider]  aspirin EC 81 MG EC tablet Take 1 tablet (81 mg total) by mouth daily. 04/12/17   Milagros Loll, MD  atorvastatin (LIPITOR) 40 MG tablet Take 1 tablet (40 mg total) by mouth daily at 6 PM. 04/11/17   Milagros Loll, MD  baclofen (LIORESAL) 10 MG tablet Take 1 tablet (10 mg total) by mouth 3 (three) times daily as needed for muscle spasms. 09/19/14   Sater, Pearletha Furl, MD  carvedilol (COREG) 25 MG tablet Take 25 mg by mouth 2 (two) times daily with a meal.    [provider]  Cholecalciferol (VITAMIN D3) 5000 units CAPS Take 1 capsule by mouth daily.    [provider]  FLUoxetine (PROZAC) 40 MG capsule Take 1 capsule (40 mg total) by mouth daily.  Patient not taking: Reported on 04/08/2017 06/07/14   Gale Journey, MD  hydrALAZINE (APRESOLINE) 50 MG tablet Take 50 mg by mouth 2 (two) times daily.    [provider]  ibuprofen (ADVIL,MOTRIN) 200 MG tablet Take 200 mg by mouth every 6 (six) hours as needed for moderate pain.    [provider]  lisinopril (PRINIVIL,ZESTRIL) 40 MG tablet Take 40 mg by mouth daily.    [provider]  methylphenidate (RITALIN) 10 MG tablet Take 2 pills qAM and one pill po 4 hours later 02/10/17   Sater, Pearletha Furl, MD  natalizumab (TYSABRI) 300 MG/15ML injection Inject 15 mLs into the vein every 28  (twenty-eight) days.     [provider]  ondansetron (ZOFRAN-ODT) 4 MG disintegrating tablet Take 4 mg by mouth every 8 (eight) hours as needed for nausea or vomiting.    [provider]  oxybutynin (DITROPAN XL) 15 MG 24 hr tablet Take 1 tablet (15 mg total) by mouth at bedtime. 05/14/16   Sater, Pearletha Furl, MD  ranitidine (ZANTAC) 300 MG tablet Take 300 mg by mouth every evening.    [provider]  spironolactone (ALDACTONE) 50 MG tablet Take 50 mg by mouth daily.    [provider]  traZODone (DESYREL) 100 MG tablet Take 1 tablet (100 mg total) by mouth at bedtime. Patient not taking: Reported on 04/08/2017 11/16/14   Asa Lente, MD    Family History Family History  Problem Relation Age of Onset  . Fibromyalgia Mother   . Stroke Father   . Diabetes Father   . Stroke Other     Social History Social History   Tobacco Use  . Smoking status: Never Smoker  . Smokeless tobacco: Never Used  Substance Use Topics  . Alcohol use: Yes    Comment: Occasional  . Drug use: No     Allergies   Oxycodone   Review of Systems Review of Systems  Constitutional: Negative for chills and fever.  HENT: Negative.   Eyes: Negative for visual disturbance.  Respiratory: Negative.   Cardiovascular: Negative.   Gastrointestinal: Negative.   Genitourinary: Negative.   Musculoskeletal: Negative.   Skin: Negative.   Neurological: Positive for facial asymmetry and speech difficulty. Negative for syncope, weakness and headaches.     Physical Exam Updated Vital Signs BP (!) 188/114 (BP Location: Right Arm)   Pulse 72   Temp 98.9 F (37.2 C)   Resp 15   Ht 5\' 9"  (1.753 m)   Wt 122.5 kg (270 lb)   LMP 01/15/2015   SpO2 97%   BMI 39.87 kg/m   Physical Exam  Constitutional: She is oriented to person, place, and time. She appears well-developed and well-nourished.  HENT:  Head: Normocephalic.  Neck: Normal range of motion. Neck supple.    Cardiovascular: Normal rate and regular rhythm.  Pulmonary/Chest: Effort normal and breath sounds normal.  Abdominal: Soft. Bowel sounds are normal. There is no tenderness. There is no rebound and no guarding.  Musculoskeletal: Normal range of motion. She exhibits no edema.  Neurological: She is alert and oriented to person, place, and time. She exhibits normal muscle tone.  CN's 3-12 grossly intact. Speech is delayed and slurred. No facial asymmetry. No lateralizing weakness. Reflexes are hyperreflexic and symmetric. No deficits of coordination however movement is slowed.  Skin: Skin is warm and dry. No rash noted.  Psychiatric: She has a normal mood and affect.     ED Treatments /  Results  Labs (all labs ordered are listed, but only abnormal results are displayed) Labs Reviewed  CBC - Abnormal; Notable for the following components:      Result Value   WBC 16.0 (*)    RBC 5.18 (*)    HCT 47.8 (*)    All other components within normal limits  DIFFERENTIAL - Abnormal; Notable for the following components:   Neutro Abs 11.6 (*)    Monocytes Absolute 1.4 (*)    All other components within normal limits  COMPREHENSIVE METABOLIC PANEL - Abnormal; Notable for the following components:   CO2 20 (*)    Glucose, Bld 124 (*)    BUN 39 (*)    Creatinine, Ser 1.33 (*)    Calcium 8.5 (*)    Total Protein 6.2 (*)    GFR calc non Af Amer 44 (*)    GFR calc Af Amer 51 (*)    All other components within normal limits  I-STAT CHEM 8, ED - Abnormal; Notable for the following components:   BUN 39 (*)    Creatinine, Ser 1.40 (*)    Glucose, Bld 112 (*)    Calcium, Ion 1.05 (*)    Hemoglobin 15.3 (*)    All other components within normal limits  PROTIME-INR  APTT  I-STAT TROPONIN, ED  CBG MONITORING, ED    EKG  EKG Interpretation  Date/Time:  Sunday April 12 2017 19:27:21 EDT Ventricular Rate:  74 PR Interval:  184 QRS Duration: 74 QT Interval:  406 QTC Calculation: 450 R  Axis:   -14 Text Interpretation:  Normal sinus rhythm Minimal voltage criteria for LVH, may be normal variant Septal infarct , age undetermined T wave abnormality, consider lateral ischemia Abnormal ECG Confirmed by Raeford Razor [id number]) on 04/12/2017 9:13:31 PM       Radiology Ct Head Wo Contrast  Result Date: 04/12/2017 CLINICAL DATA:  57 year old female with slurred speech and elevated blood pressure. EXAM: CT HEAD WITHOUT CONTRAST TECHNIQUE: Contiguous axial images were obtained from the base of the skull through the vertex without intravenous contrast. COMPARISON:  Head CT 04/08/2017. FINDINGS: Brain: Patchy and confluent areas of decreased attenuation are noted throughout the deep and periventricular white matter of the cerebral hemispheres bilaterally, compatible with chronic microvascular ischemic disease. In addition, in the left frontal lobe white matter there is a more well-defined area of low attenuation (axial image 20 of series 3 and coronal image 33 of series 5) which is new compared to the prior examination from 04/08/2017, concerning for an area of subacute ischemia. No evidence of acute hemorrhage, hydrocephalus, extra-axial collection or mass lesion/mass effect. Vascular: No hyperdense vessel or unexpected calcification. Skull: Normal. Negative for fracture or focal lesion. Sinuses/Orbits: No acute finding. Other: None. IMPRESSION: 1. Area of increasing low-attenuation in the left frontal lobe white matter concerning for evolving subacute ischemia (axial image 20 of series 3 and coronal image 33 of series 5) in the left MCA distribution. This could be confirmed with brain MRI without contrast if clinically appropriate. 2. Chronic microvascular ischemic changes in cerebral white matter. These results were called by telephone at the time of interpretation on 04/12/2017 at 9:11 pm to Dr. Raeford Razor, who verbally acknowledged these results. Electronically Signed   By: Trudie Reed M.D.    On: 04/12/2017 21:14    Procedures Procedures (including critical care time)  Medications Ordered in ED Medications  LORazepam (ATIVAN) injection 1 mg (not administered)     Initial Impression /  Assessment and Plan / ED Course  I have reviewed the triage vital signs and the nursing notes.  Pertinent labs & imaging results that were available during my care of the patient were reviewed by me and considered in my medical decision making (see chart for details).     Patient presents with worsening slurred speech, expressive aphasia, facial droop (per friend, none seen on exam). REcent admission at [facility], d/ch yesterday, sxs worse today. History of MS.  CT done at [facility] reported at stable since last MR in 2018. Head CT repeated here and shows change concerning for evolving subacute ischemia. MR recommended.  Discussed with Dr. Laurence Slate who recommends MR study with and w/o CM. Study pending.  Patient returns from MRI without completed study due to patient's size - too large for MRI scanner. Radiology staff reports the scanner for larger patient's is currently not functioning.   Dr. Laurence Slate repaged. He will see the patient in consultation for what he believes is a stroke. Hospitalist paged for admission. Dr. Katrinka Blazing accepts the patient onto his service.   All questions of patient and friend are addressed.   Final Clinical Impressions(s) / ED Diagnoses   Final diagnoses:  None   1. Stroke 2. History of MS  ED Discharge Orders    None       Danne Harbor 04/13/17 0127    Blane Ohara, MD 04/14/17 [phone]

## 2017-04-13 ENCOUNTER — Inpatient Hospital Stay (HOSPITAL_COMMUNITY): Payer: Medicare HMO

## 2017-04-13 ENCOUNTER — Emergency Department (HOSPITAL_COMMUNITY): Payer: Medicare HMO

## 2017-04-13 ENCOUNTER — Encounter (HOSPITAL_COMMUNITY): Payer: Self-pay

## 2017-04-13 DIAGNOSIS — R482 Apraxia: Secondary | ICD-10-CM | POA: Diagnosis present

## 2017-04-13 DIAGNOSIS — F40240 Claustrophobia: Secondary | ICD-10-CM | POA: Diagnosis present

## 2017-04-13 DIAGNOSIS — E785 Hyperlipidemia, unspecified: Secondary | ICD-10-CM | POA: Diagnosis present

## 2017-04-13 DIAGNOSIS — Z8669 Personal history of other diseases of the nervous system and sense organs: Secondary | ICD-10-CM

## 2017-04-13 DIAGNOSIS — Z6841 Body Mass Index (BMI) 40.0 and over, adult: Secondary | ICD-10-CM

## 2017-04-13 DIAGNOSIS — K219 Gastro-esophageal reflux disease without esophagitis: Secondary | ICD-10-CM | POA: Diagnosis present

## 2017-04-13 DIAGNOSIS — R4701 Aphasia: Secondary | ICD-10-CM | POA: Diagnosis present

## 2017-04-13 DIAGNOSIS — R1310 Dysphagia, unspecified: Secondary | ICD-10-CM | POA: Diagnosis present

## 2017-04-13 DIAGNOSIS — Y92009 Unspecified place in unspecified non-institutional (private) residence as the place of occurrence of the external cause: Secondary | ICD-10-CM | POA: Diagnosis not present

## 2017-04-13 DIAGNOSIS — E876 Hypokalemia: Secondary | ICD-10-CM | POA: Diagnosis not present

## 2017-04-13 DIAGNOSIS — I639 Cerebral infarction, unspecified: Secondary | ICD-10-CM | POA: Diagnosis present

## 2017-04-13 DIAGNOSIS — I6300 Cerebral infarction due to thrombosis of unspecified precerebral artery: Secondary | ICD-10-CM | POA: Diagnosis not present

## 2017-04-13 DIAGNOSIS — G4733 Obstructive sleep apnea (adult) (pediatric): Secondary | ICD-10-CM | POA: Diagnosis present

## 2017-04-13 DIAGNOSIS — I10 Essential (primary) hypertension: Secondary | ICD-10-CM | POA: Diagnosis present

## 2017-04-13 DIAGNOSIS — Z9989 Dependence on other enabling machines and devices: Secondary | ICD-10-CM

## 2017-04-13 DIAGNOSIS — I63412 Cerebral infarction due to embolism of left middle cerebral artery: Secondary | ICD-10-CM | POA: Diagnosis not present

## 2017-04-13 DIAGNOSIS — Z7289 Other problems related to lifestyle: Secondary | ICD-10-CM | POA: Diagnosis not present

## 2017-04-13 DIAGNOSIS — T380X5A Adverse effect of glucocorticoids and synthetic analogues, initial encounter: Secondary | ICD-10-CM | POA: Diagnosis present

## 2017-04-13 DIAGNOSIS — E860 Dehydration: Secondary | ICD-10-CM | POA: Diagnosis present

## 2017-04-13 DIAGNOSIS — H9190 Unspecified hearing loss, unspecified ear: Secondary | ICD-10-CM | POA: Diagnosis present

## 2017-04-13 DIAGNOSIS — G8191 Hemiplegia, unspecified affecting right dominant side: Secondary | ICD-10-CM | POA: Diagnosis present

## 2017-04-13 DIAGNOSIS — I63512 Cerebral infarction due to unspecified occlusion or stenosis of left middle cerebral artery: Secondary | ICD-10-CM | POA: Diagnosis present

## 2017-04-13 DIAGNOSIS — D849 Immunodeficiency, unspecified: Secondary | ICD-10-CM | POA: Diagnosis not present

## 2017-04-13 DIAGNOSIS — N179 Acute kidney failure, unspecified: Secondary | ICD-10-CM | POA: Diagnosis present

## 2017-04-13 DIAGNOSIS — F418 Other specified anxiety disorders: Secondary | ICD-10-CM

## 2017-04-13 DIAGNOSIS — D72829 Elevated white blood cell count, unspecified: Secondary | ICD-10-CM | POA: Diagnosis present

## 2017-04-13 DIAGNOSIS — R29810 Facial weakness: Secondary | ICD-10-CM | POA: Diagnosis present

## 2017-04-13 DIAGNOSIS — Z87442 Personal history of urinary calculi: Secondary | ICD-10-CM | POA: Diagnosis not present

## 2017-04-13 DIAGNOSIS — G35 Multiple sclerosis: Secondary | ICD-10-CM | POA: Diagnosis present

## 2017-04-13 DIAGNOSIS — I6389 Other cerebral infarction: Secondary | ICD-10-CM | POA: Diagnosis not present

## 2017-04-13 DIAGNOSIS — R001 Bradycardia, unspecified: Secondary | ICD-10-CM | POA: Diagnosis not present

## 2017-04-13 LAB — CBC
HEMATOCRIT: 44.2 % (ref 36.0–46.0)
Hemoglobin: 13.7 g/dL (ref 12.0–15.0)
MCH: 28.9 pg (ref 26.0–34.0)
MCHC: 31 g/dL (ref 30.0–36.0)
MCV: 93.2 fL (ref 78.0–100.0)
Platelets: 201 10*3/uL (ref 150–400)
RBC: 4.74 MIL/uL (ref 3.87–5.11)
RDW: 15.5 % (ref 11.5–15.5)
WBC: 10.5 10*3/uL (ref 4.0–10.5)

## 2017-04-13 LAB — RAPID URINE DRUG SCREEN, HOSP PERFORMED
AMPHETAMINES: NOT DETECTED
BARBITURATES: NOT DETECTED
Benzodiazepines: POSITIVE — AB
Cocaine: NOT DETECTED
OPIATES: NOT DETECTED
TETRAHYDROCANNABINOL: NOT DETECTED

## 2017-04-13 LAB — URINALYSIS, ROUTINE W REFLEX MICROSCOPIC
BILIRUBIN URINE: NEGATIVE
Glucose, UA: 50 mg/dL — AB
Hgb urine dipstick: NEGATIVE
KETONES UR: NEGATIVE mg/dL
LEUKOCYTES UA: NEGATIVE
Nitrite: NEGATIVE
PROTEIN: NEGATIVE mg/dL
Specific Gravity, Urine: 1.015 (ref 1.005–1.030)
pH: 5 (ref 5.0–8.0)

## 2017-04-13 LAB — BASIC METABOLIC PANEL
ANION GAP: 9 (ref 5–15)
BUN: 34 mg/dL — ABNORMAL HIGH (ref 6–20)
CALCIUM: 7.9 mg/dL — AB (ref 8.9–10.3)
CHLORIDE: 110 mmol/L (ref 101–111)
CO2: 22 mmol/L (ref 22–32)
Creatinine, Ser: 1.14 mg/dL — ABNORMAL HIGH (ref 0.44–1.00)
GFR calc Af Amer: >60 mL/min (ref >60)
GFR calc non Af Amer: 53 mL/min — ABNORMAL LOW (ref >60)
Glucose, Bld: 117 mg/dL — ABNORMAL HIGH (ref 65–99)
Potassium: 3.2 mmol/L — ABNORMAL LOW (ref 3.5–5.1)
Sodium: 141 mmol/L (ref 135–145)

## 2017-04-13 LAB — ANTITHROMBIN III: ANTITHROMB III FUNC: 103 % (ref 75–120)

## 2017-04-13 LAB — ETHANOL: Alcohol, Ethyl (B): <10 mg/dL (ref <10)

## 2017-04-13 LAB — APTT: aPTT: 25 seconds (ref 24–36)

## 2017-04-13 MED ORDER — ACETAMINOPHEN 325 MG PO TABS: 650.0000 mg | ORAL_TABLET | ORAL | Status: DC | PRN

## 2017-04-13 MED ORDER — HYDRALAZINE HCL 50 MG PO TABS
50.0000 mg | ORAL_TABLET | Freq: Two times a day (BID) | ORAL | Status: DC
  Administered 2017-04-13 – 2017-04-21 (×17): 50 mg via ORAL
  Filled 2017-04-13: qty 2
  Filled 2017-04-13: qty 1
  Filled 2017-04-13: qty 2
  Filled 2017-04-13 (×2): qty 1
  Filled 2017-04-13: qty 2
  Filled 2017-04-13 (×5): qty 1
  Filled 2017-04-13: qty 2
  Filled 2017-04-13 (×5): qty 1

## 2017-04-13 MED ORDER — ATORVASTATIN CALCIUM 80 MG PO TABS
80.0000 mg | ORAL_TABLET | Freq: Every day | ORAL | Status: DC
  Administered 2017-04-13 – 2017-04-20 (×8): 80 mg via ORAL
  Filled 2017-04-13 (×8): qty 1

## 2017-04-13 MED ORDER — BACLOFEN 10 MG PO TABS: 10.0000 mg | ORAL_TABLET | Freq: Three times a day (TID) | ORAL | Status: DC | PRN

## 2017-04-13 MED ORDER — IOPAMIDOL (ISOVUE-370) INJECTION 76%
INTRAVENOUS | Status: AC
  Administered 2017-04-13: 50 mL
  Filled 2017-04-13: qty 50

## 2017-04-13 MED ORDER — ATORVASTATIN CALCIUM 40 MG PO TABS: 40.0000 mg | ORAL_TABLET | Freq: Every day | ORAL | Status: DC

## 2017-04-13 MED ORDER — SODIUM CHLORIDE 0.9 % IV SOLN
INTRAVENOUS | Status: DC
  Administered 2017-04-13: 06:00:00 via INTRAVENOUS

## 2017-04-13 MED ORDER — STROKE: EARLY STAGES OF RECOVERY BOOK
Freq: Once | Status: AC
  Administered 2017-04-13: 03:00:00
  Filled 2017-04-13: qty 1

## 2017-04-13 MED ORDER — GADOBENATE DIMEGLUMINE 529 MG/ML IV SOLN
20.0000 mL | Freq: Once | INTRAVENOUS | Status: AC | PRN
  Administered 2017-04-15: 20 mL via INTRAVENOUS

## 2017-04-13 MED ORDER — SODIUM CHLORIDE 0.9 % IV BOLUS (SEPSIS)
2000.0000 mL | INTRAVENOUS | Status: AC
  Administered 2017-04-13: 2000 mL via INTRAVENOUS

## 2017-04-13 MED ORDER — ALBUTEROL SULFATE (2.5 MG/3ML) 0.083% IN NEBU: 2.5000 mg | INHALATION_SOLUTION | RESPIRATORY_TRACT | Status: DC | PRN

## 2017-04-13 MED ORDER — ENOXAPARIN SODIUM 40 MG/0.4ML ~~LOC~~ SOLN
40.0000 mg | Freq: Every day | SUBCUTANEOUS | Status: DC
  Administered 2017-04-13: 40 mg via SUBCUTANEOUS
  Filled 2017-04-13: qty 0.4

## 2017-04-13 MED ORDER — SENNOSIDES-DOCUSATE SODIUM 8.6-50 MG PO TABS: 1.0000 | ORAL_TABLET | Freq: Every evening | ORAL | Status: DC | PRN

## 2017-04-13 MED ORDER — SODIUM CHLORIDE 0.9 % IV SOLN
INTRAVENOUS | Status: AC
  Administered 2017-04-13 – 2017-04-15 (×3): via INTRAVENOUS

## 2017-04-13 MED ORDER — CARVEDILOL 12.5 MG PO TABS
25.0000 mg | ORAL_TABLET | Freq: Two times a day (BID) | ORAL | Status: DC
  Administered 2017-04-13 – 2017-04-21 (×17): 25 mg via ORAL
  Filled 2017-04-13 (×17): qty 2

## 2017-04-13 MED ORDER — OXYBUTYNIN CHLORIDE ER 15 MG PO TB24
15.0000 mg | ORAL_TABLET | Freq: Every day | ORAL | Status: DC
  Administered 2017-04-13 – 2017-04-20 (×8): 15 mg via ORAL
  Filled 2017-04-13 (×8): qty 1

## 2017-04-13 MED ORDER — ACETAMINOPHEN 160 MG/5ML PO SOLN: 650.0000 mg | ORAL | Status: DC | PRN

## 2017-04-13 MED ORDER — ASPIRIN EC 81 MG PO TBEC
81.0000 mg | DELAYED_RELEASE_TABLET | Freq: Every day | ORAL | Status: DC
  Administered 2017-04-13 – 2017-04-21 (×8): 81 mg via ORAL
  Filled 2017-04-13 (×9): qty 1

## 2017-04-13 MED ORDER — FAMOTIDINE 20 MG PO TABS
40.0000 mg | ORAL_TABLET | Freq: Every day | ORAL | Status: DC
  Administered 2017-04-13 – 2017-04-20 (×9): 40 mg via ORAL
  Filled 2017-04-13 (×9): qty 2

## 2017-04-13 MED ORDER — ALPRAZOLAM 0.5 MG PO TABS
0.5000 mg | ORAL_TABLET | Freq: Two times a day (BID) | ORAL | Status: DC | PRN
  Administered 2017-04-14 – 2017-04-21 (×4): 0.5 mg via ORAL
  Filled 2017-04-13 (×4): qty 1

## 2017-04-13 MED ORDER — ACETAMINOPHEN 650 MG RE SUPP: 650.0000 mg | RECTAL | Status: DC | PRN

## 2017-04-13 NOTE — Progress Notes (Signed)
I met with pt at bedside. Pt unable to verbalize. I will begin insurance approval for a possible inpt rehab admit. [phone]

## 2017-04-13 NOTE — ED Notes (Signed)
X-ray at bedside

## 2017-04-13 NOTE — Progress Notes (Signed)
Inpatient Rehabilitation  Per OT request, patient was screened by [facility] for appropriateness for an Inpatient Acute Rehab consult.  At this time we are recommending an Inpatient Rehab consult.  Please order if you are agreeable.    Bryannah Boston, M.A., CCC/SLP Admission Coordinator  [facility]  Cell [phone]  

## 2017-04-13 NOTE — ED Notes (Signed)
Report attempted x2

## 2017-04-13 NOTE — Progress Notes (Signed)
Pt arrived. Linens changed because they were soiled. Purewick but in place. Pt wheezing, x-ray was clear. Oriented to rm. Called RespT for CPAP machine. Alarm set.

## 2017-04-13 NOTE — Evaluation (Addendum)
Physical Therapy Evaluation Patient Details Name: Rachel Gilbert MRN: [id number] DOB: 1960-10-30 Today's Date: 04/13/2017   History of Present Illness   Rachel Gilbert is a 57 y.o. female with medical history significant of HTN, MS, HOH, OSA on CPAP, and TIA; who presents with altered mental status, slurred speech, wording finding. Brain MRI-Multiple areas of both focal and confluent restricted diffusion in the left MCA and left frontal lobe representing acute infarct.   Clinical Impression  Patient presents with mild right sided weakness, expressive aphasia, impaired balance and impaired mobility s/p above. Tolerated gait training with Min A for balance/safety due to RLE weakness and instability. Pt with 2/4 DOE and wheezing which is new. Encouraged pt to start talking. Verbalizes somewhat during session with encouragement. Has support from friends and mother at d/c. 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 Would benefit from CIR to maximize independence and mobility prior to return home. Will follow acutely.    Follow Up Recommendations CIR;Supervision - Intermittent    Equipment Recommendations  None recommended by PT    Recommendations for Other Services Rehab consult     Precautions / Restrictions Precautions Precautions: Fall Restrictions Weight Bearing Restrictions: No      Mobility  Bed Mobility Overal bed mobility: Modified Independent             General bed mobility comments: No assist needed.   Transfers Overall transfer level: Needs assistance Equipment used: None Transfers: Sit to/from Stand Sit to Stand: Min guard         General transfer comment: Min guard for safety. Guarded when standing. Stood from [employer], from toilet x1.  Ambulation/Gait Ambulation/Gait assistance: Min guard Ambulation Distance (Feet): 100 Feet Assistive device: None(rail in hallway) Gait Pattern/deviations: Step-through pattern;Decreased stride length;Decreased stance time -  right;Wide base of support Gait velocity: mildly decreased Gait velocity interpretation: Below normal speed for age/gender General Gait Details: Slow, mildly unsteady gait with bil lateral sway and wide BoS. Right knee instability noted. Holding onto rail for support. Guarded. 2/4 DOE with wheezing which pt reports as new.  Stairs            Wheelchair Mobility    Modified Rankin (Stroke Patients Only) Modified Rankin (Stroke Patients Only) Pre-Morbid Rankin Score: No symptoms Modified Rankin: Moderately severe disability     Balance Overall balance assessment: Needs assistance Sitting-balance support: Feet supported;No upper extremity supported Sitting balance-Leahy Scale: Good     Standing balance support: During functional activity Standing balance-Leahy Scale: Fair Standing balance comment: Able to perform pericare wihtout assist holding onto rail for support.                             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     Hand Dominance   Dominant Hand: Right    Extremity/Trunk Assessment   Upper Extremity Assessment Upper Extremity Assessment: Defer to OT evaluation    Lower Extremity  Assessment Lower Extremity Assessment: RLE deficits/detail RLE Deficits / Details: Grossly ~4/5 throughout. RLE Sensation: WNL    Cervical / Trunk Assessment Cervical / Trunk Assessment: Normal  Communication    Communication: Expressive difficulties  Cognition Arousal/Alertness: Awake/alert Behavior During Therapy: WFL for tasks assessed/performed Overall Cognitive Status: Difficult to assess                         Following Commands: Follows multi-step commands consistently       General Comments: Pt with expressive difficulties. Encouraged to talk. A&Ox4. Able to follow simple 1-2 step commands. HOH so this could be why pt has difficulty following commands consistently.       General Comments General comments (skin integrity, edema, etc.): Friend left prior to session.    Exercises     Assessment/Plan    PT Assessment Patient needs continued PT services  PT Problem List Decreased balance;Decreased mobility;Decreased strength;Decreased cognition       PT Treatment Interventions Gait training;Functional mobility training;Therapeutic activities;Therapeutic exercise;Balance training;Patient/family education;DME instruction    PT Goals (Current goals can be found in the Care Plan section)  Acute Rehab PT Goals Patient Stated Goal: to be able to talk and return to PLOF PT Goal Formulation: With patient Time For Goal Achievement: 04/27/17 Potential to Achieve Goals: Good    Frequency Min 4X/week   Barriers to discharge        Co-evaluation               AM-PAC PT "6 Clicks" Daily Activity  Outcome Measure Difficulty turning over in bed (including adjusting bedclothes, sheets and blankets)?: None Difficulty moving from lying on back to sitting on the side of the bed? : None Difficulty sitting down on and standing up from a chair with arms (e.g., wheelchair, bedside commode, etc,.)?: None Help needed moving to and from a bed to chair (including a wheelchair)?: None Help needed walking in hospital room?: A Little Help needed climbing 3-5 steps with a railing? : A Little 6 Click Score: 22    End of Session Equipment Utilized During Treatment: Gait belt Activity  Tolerance: Patient tolerated treatment well Patient left: in bed;with call bell/phone within reach;with bed alarm set Nurse Communication: Mobility status;Other (comment)(want to have CPAP placed) PT Visit Diagnosis: Other abnormalities of gait and mobility (R26.89);Muscle weakness (generalized) (M62.81);Hemiplegia and hemiparesis;Difficulty in walking, not elsewhere classified (R26.2) Hemiplegia - Right/Left: Right Hemiplegia - dominant/non-dominant: Dominant Hemiplegia - caused by: Cerebral infarction    Time: 4098-1191 PT Time Calculation (min) (ACUTE ONLY): 22 min   Charges:   PT Evaluation $PT Eval Moderate Complexity: 1 Mod     PT G CodesMylo Red, PT, DPT [phone]    Rachel Gilbert 04/13/2017, 2:08 PM

## 2017-04-13 NOTE — Consult Note (Signed)
Requesting Physician: Elpidio Anis [facility]    Chief Complaint: Aphasia, slurred speech  History obtained from: Patient and Chart     HPI:                                                                                                                                       Rachel Gilbert is an 57 y.o. female with PMH of MS, HTN, sleep apnea recently admitted at [facility]  presents to [facility] emergency room for worsening speech since being discharged from the hospital. She presented on 3/13 with aphasia and slurred speech, right-sided weakness. She was seen by Dr. Reynolds/neurologist who felt the patient had either a stroke or MS flare. An MRI was attempted but did not tolerate it due to claustrophobia. She underwent carotid Doppler and echocardiogram. Received 3 days of solumedrol  and was discharged from outpatient follow-up.  CT scan was performed here at  [facility], showing a left frontal hypodensity. Family felt her symptoms were worse and therefore she was admitted to hospitalist team and neurology was consulted.   Past Medical History:  Diagnosis Date  . Complication of anesthesia    hard time waking up   . Hearing loss   . Hypertension   . Kidney stones   . Multiple sclerosis (HCC)   . Sleep apnea    Uses CPAP  . Vision abnormalities     Past Surgical History:  Procedure Laterality Date  . CESAREAN SECTION  1986  . CHOLECYSTECTOMY  2000   laparoscopic    Family History  Problem Relation Age of Onset  . Fibromyalgia Mother   . Stroke Father   . Diabetes Father   . Stroke Other    Social History:  reports that  has never smoked. she has never used smokeless tobacco. She reports that she drinks alcohol. She reports that she does not use drugs.  Allergies:  Allergies  Allergen Reactions  . Oxycodone Anaphylaxis    Medications:                                                                                                                        I reviewed home  medications   ROS:  14 systems reviewed and negative except above    Examination:                                                                                                      General: Appears well-developed and well-nourished.  Psych: Affect appropriate to situation Eyes: No scleral injection HENT: No OP obstrucion Head: Normocephalic.  Cardiovascular: Normal rate and regular rhythm.  Respiratory: Effort normal and breath sounds normal to anterior ascultation GI: Soft.  No distension. There is no tenderness.  Skin: WDI   Neurological Examination Mental Status: Alert, oriented, thought content appropriate. Moderate aphasia. Able to follow follow commands without difficulty. Cranial Nerves: II: Visual fields : reduced blink to threat over left eye III,IV, VI: ptosis not present, extra-ocular motions intact bilaterally, pupils equal, round, reactive to light and accommodation V,VII: smile symmetric, facial light touch sensation normal bilaterally VIII: hearing normal bilaterally IX,X: uvula rises symmetrically XI: bilateral shoulder shrug XII: midline tongue extension Motor: Right : Upper extremity   4/5    Left:     Upper extremity   5/5  Lower extremity   4+/5     Lower extremity   5/5 Tone and bulk:normal tone throughout; no atrophy noted Sensory: Pinprick and light touch intact throughout, bilaterally Deep Tendon Reflexes: 2+ and symmetric throughout Plantars: Right: downgoing   Left: downgoing Cerebellar: normal finger-to-nose, normal rapid alternating movements and normal heel-to-shin test Gait: normal gait and station     Lab Results: Basic Metabolic Panel: Recent Labs  Lab 04/08/17 0943 04/12/17 1954 04/12/17 1959  NA 141 137 143  K 3.7 3.6 3.6  CL 108 106 109  CO2 23 20*  --   GLUCOSE 147* 124* 112*  BUN 14 39* 39*   CREATININE 1.07* 1.33* 1.40*  CALCIUM 9.1 8.5*  --     CBC: Recent Labs  Lab 04/08/17 0943 04/12/17 1954 04/12/17 1959  WBC 10.5 16.0*  --   NEUTROABS 7.5* 11.6*  --   HGB 14.8 15.0 15.3*  HCT 44.6 47.8* 45.0  MCV 88.5 92.3  --   PLT 237 264  --     Coagulation Studies: Recent Labs    04/12/17 1954  LABPROT 13.3  INR 1.02    Imaging: Ct Head Wo Contrast  Result Date: 04/12/2017 CLINICAL DATA:  57 year old female with slurred speech and elevated blood pressure. EXAM: CT HEAD WITHOUT CONTRAST TECHNIQUE: Contiguous axial images were obtained from the base of the skull through the vertex without intravenous contrast. COMPARISON:  Head CT 04/08/2017. FINDINGS: Brain: Patchy and confluent areas of decreased attenuation are noted throughout the deep and periventricular white matter of the cerebral hemispheres bilaterally, compatible with chronic microvascular ischemic disease. In addition, in the left frontal lobe white matter there is a more well-defined area of low attenuation (axial image 20 of series 3 and coronal image 33 of series 5) which is new compared to the prior examination from 04/08/2017, concerning for an area of subacute ischemia. No evidence of acute hemorrhage, hydrocephalus, extra-axial collection or mass lesion/mass effect. Vascular: No hyperdense vessel or  unexpected calcification. Skull: Normal. Negative for fracture or focal lesion. Sinuses/Orbits: No acute finding. Other: None. IMPRESSION: 1. Area of increasing low-attenuation in the left frontal lobe white matter concerning for evolving subacute ischemia (axial image 20 of series 3 and coronal image 33 of series 5) in the left MCA distribution. This could be confirmed with brain MRI without contrast if clinically appropriate. 2. Chronic microvascular ischemic changes in cerebral white matter. These results were called by telephone at the time of interpretation on 04/12/2017 at 9:11 pm to Dr. Raeford Razor, who verbally  acknowledged these results. Electronically Signed   By: Trudie Reed M.D.   On: 04/12/2017 21:14   Dg Chest Portable 1 View  Result Date: 04/13/2017 CLINICAL DATA:  Cough.  Possible aspiration. EXAM: PORTABLE CHEST 1 VIEW COMPARISON:  Frontal and lateral views 06/04/2014 FINDINGS: Unchanged prominent cardiac silhouette. Minimal bibasilar atelectasis. No pulmonary edema, confluent airspace disease, large pleural effusion or pneumothorax. Body habitus and low lung volumes limit assessment. IMPRESSION: Hypoventilatory chest with mild bibasilar atelectasis. Similar enlarged cardiac silhouette. Electronically Signed   By: Rubye Oaks M.D.   On: 04/13/2017 01:41     ASSESSMENT AND PLAN   57 y.o. female with PMH of MS, HTN, sleep apnea with left frontal of acute ischemic stroke. Will admit for PT/OT evaluation and will likely need vascular imaging of the head, MRI brain and given her young age, TEE and hypercoagulable workup.   Acute Ischemic Stroke   Risk factors Etiology:   Recommend # MRI of the brain with/without contrast # MRA head  # Continue  patient on ASA 325mg  daily #Start or continue Atorvastatin 40 mg/other high intensity statin # BP goal: 160- 180 SBP # HBAIC and Lipid profile # hypercoagulable workup # Telemetry monitoring # Frequent neuro checks # NPO until passes stroke swallow screen  Please page stroke NP  Or  PA  Or MD from 8am -4 pm  as this patient from this time will be  followed by the stroke.   You can look them up on [email or web address]  Password [facility]   Baneen Wieseler [facility] Pager Number [phone]

## 2017-04-13 NOTE — ED Notes (Signed)
Admitting MD at bedside.

## 2017-04-13 NOTE — Progress Notes (Signed)
Report called on patient who is transferring to 3W. Report passed along to receiving RT for CPAP.

## 2017-04-13 NOTE — Progress Notes (Signed)
Patient admitted after midnight, please see H&P.  Recently admitted to [place] with MS flare-- was given several days of IV steroids.  RE-presented to the ER with AMS/slurred speech and word finding difficulties.  MRI pending  Marlin Canary DO

## 2017-04-13 NOTE — Progress Notes (Signed)
Continuing the 2,000L bolus that was started in the ED. See MAR. Called to confirm.

## 2017-04-13 NOTE — Progress Notes (Signed)
Pt on cpap when RT entered.  Rt will continue to monitor.

## 2017-04-13 NOTE — Progress Notes (Signed)
PT Cancellation Note  Patient Details Name: Rachel Gilbert MRN: [id number] DOB: [birth year]   Cancelled Treatment:    Reason Eval/Treat Not Completed: Patient at procedure or test/unavailable pt off floor at MRI. Will follow.   Blake Divine A Chioma Mukherjee 04/13/2017, 11:49 AM [name], PT, DPT [phone]

## 2017-04-13 NOTE — Consult Note (Signed)
Physical Medicine and Rehabilitation Consult Reason for Consult:Altered mental status with slurred speech Referring Physician: [facility]   HPI: Rachel Gilbert is a 57 y.o.right handed female with history of hypertension, multiple sclerosis diagnosed in 2012 followed by neurologist Dr. Theadore Nan, obstructive sleep apnea with CPAP.Patient lives alone and was independent prior to 04/08/2017. She still drives and was assisting an elderly lady with simple housecleaning. Presented 04/13/2017  with slurred speech, mild right facial droop and word finding difficulty. Patient with recent admission 04/08/2017 to [facility] for same stated symptoms MRI attempted but did not tolerate due to claustrophobia.  Echocardiogram was completed with ejection fraction of 65%. Systolic function normal.Carotid Doppler no ICA stenosis. There is some question of possible MS exacerbation received Solu-Medrol 3 doses. She was discharged home 04/11/2017 maintained on aspirin. A follow-up CT of the head 04/12/2017 showed an area of increasing low attenuation in the left frontal lobe white matter concerning for evolving subacute ischemia in the left MCA distribution.MRI with limited study as patient had refused to continue exam showing multiple areas of both focal and confluent restricted diffusion within the left MCA territory, left frontal predominant representing nonhemorrhagic acute infarction. Multiple white matter lesions, periventricular predominant consistent with chronic multiple sclerosis.CT angiogram of headare pending. Patient did not receive TPA.Subcutaneous Lovenox for DVT prophylaxis.[name] Neurology consulted with workup ongoing. Occupational therapy evaluation completed with recommendations of physical medicine rehabilitation consult.   Review of Systems  Constitutional: Negative for chills and fever.  HENT: Positive for hearing loss.   Eyes: Negative for blurred vision and double vision.    Respiratory: Negative for cough and shortness of breath.   Cardiovascular: Positive for leg swelling. Negative for chest pain and palpitations.  Gastrointestinal: Positive for constipation. Negative for nausea and vomiting.  Genitourinary: Negative for dysuria and flank pain.  Musculoskeletal: Positive for myalgias.  Skin: Negative for rash.  Neurological: Positive for speech change and focal weakness. Negative for seizures.  All other systems reviewed and are negative.  Past Medical History:  Diagnosis Date  . Complication of anesthesia    hard time waking up   . Hearing loss   . Hypertension   . Kidney stones   . Multiple sclerosis (HCC)   . Sleep apnea    Uses CPAP  . Vision abnormalities    Past Surgical History:  Procedure Laterality Date  . CESAREAN SECTION  1986  . CHOLECYSTECTOMY  2000   laparoscopic   Family History  Problem Relation Age of Onset  . Fibromyalgia Mother   . Stroke Father   . Diabetes Father   . Stroke Other    Social History:  reports that  has never smoked. she has never used smokeless tobacco. She reports that she drinks alcohol. She reports that she does not use drugs. Allergies:  Allergies  Allergen Reactions  . Oxycodone Anaphylaxis   Medications Prior to Admission  Medication Sig Dispense Refill  . acetaminophen (TYLENOL) 500 MG tablet Take 500 mg by mouth every 6 (six) hours as needed for mild pain.    [name] ALPRAZolam (XANAX) 0.5 MG tablet Take 0.5 mg by mouth 2 (two) times daily as needed for anxiety.    . Cholecalciferol (VITAMIN D3) 5000 units CAPS Take 5,000 Units by mouth daily.     [name] aspirin EC 81 MG EC tablet Take 1 tablet (81 mg total) by mouth daily. 30 tablet 0  . atorvastatin (LIPITOR) 40 MG tablet Take 1 tablet (40 mg total)  by mouth daily at 6 PM. 30 tablet 0  . baclofen (LIORESAL) 10 MG tablet Take 1 tablet (10 mg total) by mouth 3 (three) times daily as needed for muscle spasms. 90 each 3  . carvedilol (COREG) 25 MG tablet  Take 25 mg by mouth 2 (two) times daily with a meal.    . FLUoxetine (PROZAC) 40 MG capsule Take 1 capsule (40 mg total) by mouth daily. 30 capsule 3  . hydrALAZINE (APRESOLINE) 50 MG tablet Take 50 mg by mouth 2 (two) times daily.    [name] ibuprofen (ADVIL,MOTRIN) 200 MG tablet Take 200 mg by mouth every 6 (six) hours as needed for moderate pain.    [name] lisinopril (PRINIVIL,ZESTRIL) 40 MG tablet Take 40 mg by mouth daily.    . methylphenidate (RITALIN) 10 MG tablet Take 2 pills qAM and one pill po 4 hours later 90 tablet 0  . natalizumab (TYSABRI) 300 MG/15ML injection Inject 15 mLs into the vein every 28 (twenty-eight) days.     . ondansetron (ZOFRAN-ODT) 4 MG disintegrating tablet Take 4 mg by mouth every 8 (eight) hours as needed for nausea or vomiting.    [name] oxybutynin (DITROPAN XL) 15 MG 24 hr tablet Take 1 tablet (15 mg total) by mouth at bedtime. 90 tablet 3  . ranitidine (ZANTAC) 300 MG tablet Take 300 mg by mouth every evening.    [name] spironolactone (ALDACTONE) 50 MG tablet Take 50 mg by mouth daily.    . traZODone (DESYREL) 100 MG tablet Take 1 tablet (100 mg total) by mouth at bedtime. 30 tablet 11    Home: Home Living Family/patient expects to be discharged to:: Private residence Living Arrangements: Alone(sister for heavy cleaning; per [name] (her friend, pt's mother can stay with pt as long pt is S level or better)) Type of Home: House Home Access: Ramped entrance Home Layout: One level Bathroom Shower/Tub: Tub/shower unit Home Equipment: Medical laboratory scientific officer - single point, Grab bars - tub/shower, Information systems manager  Lives With: Alone  Functional History: Prior Function Level of Independence: Independent Comments: As of recent has been staying with a lady from church who had surgery and helping her out Functional Status:  Mobility: Bed Mobility Overal bed mobility: Independent Transfers Overall transfer level: Needs assistance Equipment used: None Transfers: Sit to/from Stand Sit to Stand: Min  guard General transfer comment: Needed min A for ambulation from bed to bathroom as well as she steadied herself on sink      ADL: ADL Overall ADL's : Needs assistance/impaired Eating/Feeding: Modified independent Eating/Feeding Details (indicate cue type and reason): increased time Grooming: Min guard, Standing Upper Body Bathing: Supervision/ safety, Sitting, Set up Lower Body Bathing: Moderate assistance Lower Body Bathing Details (indicate cue type and reason): min guard A sit<>stand Upper Body Dressing : Set up, Supervision/safety, Sitting Upper Body Dressing Details (indicate cue type and reason): increased time due to do decreased coordination of RUE Lower Body Dressing: Moderate assistance Lower Body Dressing Details (indicate cue type and reason): min guard A sit<>stand Toilet Transfer: Minimal assistance, Ambulation, Regular Toilet, Grab bars Toileting- Clothing Manipulation and Hygiene: Min guard, Sit to/from stand  Cognition: Cognition Overall Cognitive Status: Impaired/Different from baseline Orientation Level: Oriented to person, Oriented to place, Oriented to situation, Disoriented to time Cognition Arousal/Alertness: Awake/alert Behavior During Therapy: WFL for tasks assessed/performed Overall Cognitive Status: Impaired/Different from baseline Area of Impairment: Following commands General Comments: At times needed gestural and demonstration cues for following commands  Blood pressure (!) 142/66, pulse 72,  temperature (!) 97.5 F (36.4 C), temperature source Oral, resp. rate 20, height 5\' 9"  (1.753 m), weight 123.6 kg (272 lb 7.8 oz), last menstrual period 01/15/2015, SpO2 98 %. Physical Exam  Constitutional:  57 year old right-handed obese female  HENT:  Head: Normocephalic.  Eyes:  Pupils round and reactive to light  Neck: Normal range of motion. Neck supple. No thyromegaly present.  Cardiovascular: Normal rate, regular rhythm and normal heart sounds.    Respiratory: Effort normal and breath sounds normal.  GI: Soft. Bowel sounds are normal. She exhibits no distension. There is no tenderness.  Neurological: She is alert.  Make good eye contact with examiner.She is aphasic but will provide her name as well as follow simple commands. Mild right hemiparesis  Skin: Skin is warm and dry.    Results for orders placed or performed during the hospital encounter of 04/12/17 (from the past 24 hour(s))  Protime-INR     Status: None   Collection Time: 04/12/17  7:54 PM  Result Value Ref Range   Prothrombin Time 13.3 11.4 - 15.2 seconds   INR 1.02   APTT     Status: None   Collection Time: 04/12/17  7:54 PM  Result Value Ref Range   aPTT 26 24 - 36 seconds  CBC     Status: Abnormal   Collection Time: 04/12/17  7:54 PM  Result Value Ref Range   WBC 16.0 (H) 4.0 - 10.5 K/uL   RBC 5.18 (H) 3.87 - 5.11 MIL/uL   Hemoglobin 15.0 12.0 - 15.0 g/dL   HCT 16.1 (H) 09.6 - 04.5 %   MCV 92.3 78.0 - 100.0 fL   MCH 29.0 26.0 - 34.0 pg   MCHC 31.4 30.0 - 36.0 g/dL   RDW 40.9 81.1 - 91.4 %   Platelets 264 150 - 400 K/uL  Differential     Status: Abnormal   Collection Time: 04/12/17  7:54 PM  Result Value Ref Range   Neutrophils Relative % 72 %   Lymphocytes Relative 19 %   Monocytes Relative 9 %   Eosinophils Relative 0 %   Basophils Relative 0 %   Neutro Abs 11.6 (H) 1.7 - 7.7 K/uL   Lymphs Abs 3.0 0.7 - 4.0 K/uL   Monocytes Absolute 1.4 (H) 0.1 - 1.0 K/uL   Eosinophils Absolute 0.0 0.0 - 0.7 K/uL   Basophils Absolute 0.0 0.0 - 0.1 K/uL   WBC Morphology ATYPICAL LYMPHOCYTES   Comprehensive metabolic panel     Status: Abnormal   Collection Time: 04/12/17  7:54 PM  Result Value Ref Range   Sodium 137 135 - 145 mmol/L   Potassium 3.6 3.5 - 5.1 mmol/L   Chloride 106 101 - 111 mmol/L   CO2 20 (L) 22 - 32 mmol/L   Glucose, Bld 124 (H) 65 - 99 mg/dL   BUN 39 (H) 6 - 20 mg/dL   Creatinine, Ser 7.82 (H) 0.44 - 1.00 mg/dL   Calcium 8.5 (L) 8.9 - 10.3  mg/dL   Total Protein 6.2 (L) 6.5 - 8.1 g/dL   Albumin 3.6 3.5 - 5.0 g/dL   AST 22 15 - 41 U/L   ALT 24 14 - 54 U/L   Alkaline Phosphatase 70 38 - 126 U/L   Total Bilirubin 0.8 0.3 - 1.2 mg/dL   GFR calc non Af Amer 44 (L) >60 mL/min   GFR calc Af Amer 51 (L) >60 mL/min   Anion gap 11 5 - 15  I-stat troponin,  ED     Status: None   Collection Time: 04/12/17  7:57 PM  Result Value Ref Range   Troponin i, poc 0.03 0.00 - 0.08 ng/mL   Comment 3          I-Stat Chem 8, ED     Status: Abnormal   Collection Time: 04/12/17  7:59 PM  Result Value Ref Range   Sodium 143 135 - 145 mmol/L   Potassium 3.6 3.5 - 5.1 mmol/L   Chloride 109 101 - 111 mmol/L   BUN 39 (H) 6 - 20 mg/dL   Creatinine, Ser 5.40 (H) 0.44 - 1.00 mg/dL   Glucose, Bld 981 (H) 65 - 99 mg/dL   Calcium, Ion 1.91 (L) 1.15 - 1.40 mmol/L   TCO2 23 22 - 32 mmol/L   Hemoglobin 15.3 (H) 12.0 - 15.0 g/dL   HCT 47.8 29.5 - 62.1 %  Ethanol     Status: None   Collection Time: 04/13/17  1:11 AM  Result Value Ref Range   Alcohol, Ethyl (B) <10 <10 mg/dL  APTT     Status: None   Collection Time: 04/13/17  4:48 AM  Result Value Ref Range   aPTT 25 24 - 36 seconds  Antithrombin III     Status: None   Collection Time: 04/13/17  6:09 AM  Result Value Ref Range   AntiThromb III Func 103 75 - 120 %  CBC     Status: None   Collection Time: 04/13/17 11:04 AM  Result Value Ref Range   WBC 10.5 4.0 - 10.5 K/uL   RBC 4.74 3.87 - 5.11 MIL/uL   Hemoglobin 13.7 12.0 - 15.0 g/dL   HCT 30.8 65.7 - 84.6 %   MCV 93.2 78.0 - 100.0 fL   MCH 28.9 26.0 - 34.0 pg   MCHC 31.0 30.0 - 36.0 g/dL   RDW 96.2 95.2 - 84.1 %   Platelets 201 150 - 400 K/uL  Basic metabolic panel     Status: Abnormal   Collection Time: 04/13/17 11:04 AM  Result Value Ref Range   Sodium 141 135 - 145 mmol/L   Potassium 3.2 (L) 3.5 - 5.1 mmol/L   Chloride 110 101 - 111 mmol/L   CO2 22 22 - 32 mmol/L   Glucose, Bld 117 (H) 65 - 99 mg/dL   BUN 34 (H) 6 - 20 mg/dL    Creatinine, Ser 3.24 (H) 0.44 - 1.00 mg/dL   Calcium 7.9 (L) 8.9 - 10.3 mg/dL   GFR calc non Af Amer 53 (L) >60 mL/min   GFR calc Af Amer >60 >60 mL/min   Anion gap 9 5 - 15   Ct Head Wo Contrast  Result Date: 04/12/2017 CLINICAL DATA:  57 year old female with slurred speech and elevated blood pressure. EXAM: CT HEAD WITHOUT CONTRAST TECHNIQUE: Contiguous axial images were obtained from the base of the skull through the vertex without intravenous contrast. COMPARISON:  Head CT 04/08/2017. FINDINGS: Brain: Patchy and confluent areas of decreased attenuation are noted throughout the deep and periventricular white matter of the cerebral hemispheres bilaterally, compatible with chronic microvascular ischemic disease. In addition, in the left frontal lobe white matter there is a more well-defined area of low attenuation (axial image 20 of series 3 and coronal image 33 of series 5) which is new compared to the prior examination from 04/08/2017, concerning for an area of subacute ischemia. No evidence of acute hemorrhage, hydrocephalus, extra-axial collection or mass lesion/mass effect. Vascular: No hyperdense  vessel or unexpected calcification. Skull: Normal. Negative for fracture or focal lesion. Sinuses/Orbits: No acute finding. Other: None. IMPRESSION: 1. Area of increasing low-attenuation in the left frontal lobe white matter concerning for evolving subacute ischemia (axial image 20 of series 3 and coronal image 33 of series 5) in the left MCA distribution. This could be confirmed with brain MRI without contrast if clinically appropriate. 2. Chronic microvascular ischemic changes in cerebral white matter. These results were called by telephone at the time of interpretation on 04/12/2017 at 9:11 pm to Dr. Raeford Razor, who verbally acknowledged these results. Electronically Signed   By: Trudie Reed M.D.   On: 04/12/2017 21:14   Mr Brain Wo Contrast  Result Date: 04/13/2017 CLINICAL DATA:  New onset of  slurred speech, without definite weakness. History of hypertension. History of multiple sclerosis. EXAM: MRI HEAD WITHOUT CONTRAST TECHNIQUE: Multiplanar, multiecho pulse sequences of the brain and surrounding structures were obtained without intravenous contrast. COMPARISON:  CT head 04/08/2017. CT head 04/12/2017. MRI 07/02/2016 and 11/29/2014. FINDINGS: The patient prematurely truncated the exam after diffusion, sagittal T1, and axial T2 images were obtained. Sensitivity and specificity are reduced. Brain: Multiple areas of both focal and confluent restricted diffusion affect the LEFT frontal cortex and subcortical white matter, with slight involvement of the superior insula, possibly the superior lentiform nucleus, consistent with acute infarction. These correspond to LEFT MCA territory ischemia. No hemorrhage, mass lesion, hydrocephalus, or extra-axial fluid. Mild premature for age cerebral volume loss. Numerous periventricular white matter lesions are observed, likely greater than 10, with corresponding volume loss of the corpus callosum. None definitely demonstrate restriction. Medial LEFT thalamic T2 hyperintense lesion could be ischemic or related to chronic demyelination. Similarly, LEFT external capsule lesions could be ischemic or demyelinating in origin. These are incompletely evaluated given the premature termination of the study. Vascular: Normal flow voids. Skull and upper cervical spine: Normal marrow signal Sinuses/Orbits: Chronic sinus disease. Symmetric and negative orbits except for proptosis. Other: None. Compared with prior MRs, similar appearance to the suspected demyelinating lesions. IMPRESSION: The patient refused to continue the exam, resulting in an incomplete study. Sensitivity and specificity are reduced. Multiple areas of both focal and confluent restricted diffusion within the LEFT MCA territory, LEFT frontal predominant, representing nonhemorrhagic acute infarction, corresponding  with the recent CT. Multiple white matter lesions, periventricular predominant, consistent with chronic multiple sclerosis. Electronically Signed   By: Elsie Stain M.D.   On: 04/13/2017 10:21   Dg Chest Portable 1 View  Result Date: 04/13/2017 CLINICAL DATA:  Cough.  Possible aspiration. EXAM: PORTABLE CHEST 1 VIEW COMPARISON:  Frontal and lateral views 06/04/2014 FINDINGS: Unchanged prominent cardiac silhouette. Minimal bibasilar atelectasis. No pulmonary edema, confluent airspace disease, large pleural effusion or pneumothorax. Body habitus and low lung volumes limit assessment. IMPRESSION: Hypoventilatory chest with mild bibasilar atelectasis. Similar enlarged cardiac silhouette. Electronically Signed   By: Rubye Oaks M.D.   On: 04/13/2017 01:41    Assessment/Plan: Diagnosis: 57 yo female with MS who suffered an acute left MCA infarct 1. Does the need for close, 24 hr/day medical supervision in concert with the patient's rehab needs make it unreasonable for this patient to be served in a less intensive setting? Yes 2. Co-Morbidities requiring supervision/potential complications: htn, OSA/CPAP, morbid obesity 3. Due to bladder management, bowel management, safety, skin/wound care, disease management, medication administration, pain management and patient education, does the patient require 24 hr/day rehab nursing? Yes 4. Does the patient require coordinated care of a physician, rehab  nurse, PT (1-2 hrs/day, 5 days/week), OT (1-2 hrs/day, 5 days/week) and SLP (1-2 hrs/day, 5 days/week) to address physical and functional deficits in the context of the above medical diagnosis(es)? Yes Addressing deficits in the following areas: balance, endurance, locomotion, strength, transferring, bowel/bladder control, bathing, dressing, feeding, grooming, toileting, cognition, language and psychosocial support 5. Can the patient actively participate in an intensive therapy program of at least 3 hrs of  therapy per day at least 5 days per week? Yes 6. The potential for patient to make measurable gains while on inpatient rehab is excellent 7. Anticipated functional outcomes upon discharge from inpatient rehab are modified independent and supervision  with PT, modified independent and supervision with OT, modified independent, supervision and min assist with SLP. 8. Estimated rehab length of stay to reach the above functional goals is: 7-11 days 9. Anticipated D/C setting: Home 10. Anticipated post D/C treatments: HH therapy 11. Overall Rehab/Functional Prognosis: excellent  RECOMMENDATIONS: This patient's condition is appropriate for continued rehabilitative care in the following setting: CIR Patient has agreed to participate in recommended program. Potentially Note that insurance prior authorization may be required for reimbursement for recommended care.  Comment: Rehab Admissions Coordinator to follow up.  Thanks,  Ranelle Oyster, MD, [place] [facility]    Mcarthur Rossetti Angiulli, PA-C 04/13/2017

## 2017-04-13 NOTE — Progress Notes (Addendum)
STROKE TEAM PROGRESS NOTE   HISTORY OF PRESENT ILLNESS (per record) Rachel Gilbert is an 57 y.o. female with PMH of MS, HTN, sleep apnea recently admitted at [facility] hospital  presents to [facility] emergency room for worsening speech since being discharged from the hospital. She presented on 3/13 with aphasia and slurred speech, right-sided weakness. She was seen by Dr. Reynolds/neurologist who felt the patient had either a stroke or MS flare. An MRI was attempted but did not tolerate it due to claustrophobia. She underwent carotid Doppler and echocardiogram. Received 3 days of solumedrol  and was discharged from outpatient follow-up. CT scan was performed here at  [facility], showing a left frontal hypodensity. Family felt her symptoms were worse and therefore she was admitted to hospitalist team and neurology was consulted.   SUBJECTIVE (INTERVAL HISTORY) No family members present.  The patient continues to have some problems with speech.  Mainly expressive a aphasia.  An MRI of the brain was performed today; however, the patient was not able to tolerate the entire study.  Dr. Pearlean Brownie feels the patient should have a CT angiogram to further determine if her symptoms are from an MS exacerbation or a stroke.  Her renal function is not ideal -therefore we will hydrate the patient overnight and perform a CT angiogram tomorrow.   OBJECTIVE Temp:  [97.9 F (36.6 C)-98.9 F (37.2 C)] 97.9 F (36.6 C) (03/18 0258) Pulse Rate:  [56-72] 61 (03/18 0325) Cardiac Rhythm: Sinus bradycardia (03/18 0700) Resp:  [13-22] 18 (03/18 0325) BP: (127-188)/(68-114) 149/68 (03/18 0258) SpO2:  [95 %-98 %] 96 % (03/18 0325) Weight:  [270 lb (122.5 kg)-272 lb 7.8 oz (123.6 kg)] 272 lb 7.8 oz (123.6 kg) (03/18 0258)  CBC:  Recent Labs  Lab 04/08/17 0943 04/12/17 1954 04/12/17 1959  WBC 10.5 16.0*  --   NEUTROABS 7.5* 11.6*  --   HGB 14.8 15.0 15.3*  HCT 44.6 47.8* 45.0  MCV 88.5 92.3  --   PLT 237 264  --      Basic Metabolic Panel:  Recent Labs  Lab 04/08/17 0943 04/12/17 1954 04/12/17 1959  NA 141 137 143  K 3.7 3.6 3.6  CL 108 106 109  CO2 23 20*  --   GLUCOSE 147* 124* 112*  BUN 14 39* 39*  CREATININE 1.07* 1.33* 1.40*  CALCIUM 9.1 8.5*  --     Lipid Panel:     Component Value Date/Time   CHOL 156 04/09/2017 0723   TRIG 142 04/09/2017 0723   HDL 46 04/09/2017 0723   CHOLHDL 3.4 04/09/2017 0723   VLDL 28 04/09/2017 0723   LDLCALC 82 04/09/2017 0723   HgbA1c:  Lab Results  Component Value Date   HGBA1C 5.5 04/09/2017   Urine Drug Screen:     Component Value Date/Time   LABOPIA NONE DETECTED 04/08/2017 0943   COCAINSCRNUR NONE DETECTED 04/08/2017 0943   LABBENZ NONE DETECTED 04/08/2017 0943   AMPHETMU NONE DETECTED 04/08/2017 0943   THCU NONE DETECTED 04/08/2017 0943   LABBARB NONE DETECTED 04/08/2017 0943    Alcohol Level     Component Value Date/Time   ETH <10 04/13/2017 0111    IMAGING   Ct Head Wo Contrast 04/12/2017 IMPRESSION:  1. Area of increasing low-attenuation in the left frontal lobe white matter concerning for evolving subacute ischemia (axial image 20 of series 3 and coronal image 33 of series 5) in the left MCA distribution. This could be confirmed with brain MRI without  contrast if clinically appropriate.  2. Chronic microvascular ischemic changes in cerebral white matter.    Dg Chest Portable 1 View 04/13/2017 IMPRESSION:  Hypoventilatory chest with mild bibasilar atelectasis. Similar enlarged cardiac silhouette.    MR Brain W Wo Contrast  04/13/2017 IMPRESSION: The patient refused to continue the exam, resulting in an incomplete study. Sensitivity and specificity are reduced. Multiple areas of both focal and confluent restricted diffusion within the LEFT MCA territory, LEFT frontal predominant, representing nonhemorrhagic acute infarction, corresponding with the recent CT. Multiple white matter lesions, periventricular predominant,  consistent with chronic multiple sclerosis.   Transthoracic Echocardiogram  04/08/2017 Study Conclusions - Procedure narrative: Transthoracic echocardiography. The study   was technically difficult. - Left ventricle: The cavity size was normal. Wall thickness was   increased in a pattern of moderate LVH. Systolic function was   normal. The estimated ejection fraction was in the range of 55% to 65%. - Aortic valve: Valve area (Vmax): 2.37 cm^2.   Bilateral Carotid Dopplers  04/08/2017 RIGHT CAROTID ARTERY: Minimal intimal thickening in the bulb. Low resistance internal carotid Doppler pattern. RIGHT VERTEBRAL ARTERY:  Antegrade. LEFT CAROTID ARTERY: Little if any plaque in the bulb. Low resistance internal carotid Doppler pattern. LEFT VERTEBRAL ARTERY:  Antegrade. IMPRESSION: Less than 50% stenosis in the right and left internal carotid arteries.    PHYSICAL EXAM Vitals:   04/13/17 0145 04/13/17 0200 04/13/17 0258 04/13/17 0325  BP: (!) 144/72 (!) 152/77 (!) 149/68   Pulse: (!) 59 66 67 61  Resp:   18 18  Temp:   97.9 F (36.6 C)   TempSrc:   Oral   SpO2: 97% 96% 95% 96%  Weight:   272 lb 7.8 oz (123.6 kg)   Height:   5\' 9"  (1.753 m)    Pleasant middle-aged obese Caucasian lady currently not in distress. . Afebrile. Head is nontraumatic. Neck is supple without bruit.    Cardiac exam no murmur or gallop. Lungs are clear to auscultation. Distal pulses are well felt. Neurological Exam ;  Awake and alert nonfluent speech with child-quality with some word hesitancy. Able to name and repeat well. Good comprehension. No paraphasias.extraocular moments are full range without nystagmus. Blinks to threat bilaterally. Mild right lower facial weakness. Tongue midline. Motor system exam mild right hemiparesis with weakness of right grip and intrinsic hand muscles. Orbits left over right upper extremity. Mild weakness of right hip flexors and ankle dorsiflexors.ensation is intact  bilaterally. She is able  to ambulate with slight favoring of the right leg  ASSESSMENT/PLAN [name] is a 57 y.o. female with history of hypertension, hyperlipidemia, claustrophobia, obstructive sleep apnea, and multiple sclerosis presenting with speech difficulties. She did not receive IV t-PA due to late presentation.  Stroke:  left MCA distribution large subcorticalinfarct -  source unknown. Versus multiple sclerosis lesion ? PML given periventricular location and patient is on long-term Tysabri for her MS  Resultant - expressive aphasia  CT head - Area of increasing low-attenuation in the left frontal lobe white matter.  MRI head - incomplete study - Lt MCA subcorticalacute infarct and findings c/w MS.  MRA head - not performed  CTA Head - pending  Carotid Doppler - Less than 50% stenosis in the right and left internal carotid arteries.  2D Echo - EF 55-60%.  No cardiac source of emboli identified.  Transcranial Dopplers with bubble study - pending  LDL - 82  HgbA1c - 5.5  VTE prophylaxis - Lovenox Diet NPO  time specified Fall precautions Aspiration precautions  aspirin 81 mg daily prior to admission, now on aspirin 81 mg daily  Patient counseled to be compliant with her antithrombotic medications  Ongoing aggressive stroke risk factor management  Therapy recommendations:  CIR recommended  Disposition:  Pending  Hypertension  Stable  Permissive hypertension (OK if < 220/120) but gradually normalize in 5-7 days  Long-term BP goal normotensive  Hyperlipidemia  Lipid lowering medication PTA: Lipitor 40 mg daily  LDL 82, goal < 70  Increase Lipitor to 80 mg daily  Continue statin at discharge   Other Stroke Risk Factors  ETOH use, advised to drink no more than 1 drink per day.  Obesity, Body mass index is 40.24 kg/m., recommend weight loss, diet and exercise as appropriate   Family hx stroke (Father)  Obstructive sleep apnea, on CPAP at  home  Other Active Problems  MS -followed by Dr. Epimenio Foot  Acute kidney injury - BUN -39 ; creatinine -1.40  Claustrophobia  Leukocytosis - monitor - afebrile (recent steroids)  Foul-smelling urine -> UA pending   Plan / Recommendations   Stroke workup  CTA head tomorrow - hydrate with IV fluids for 48 hours -> bmet in a.m.  Transcranial Doppler study with bubbles rule out PFO  Coagulopathy labs pending   Hospital day # 0  Delton See PA-C [facility] Pager [phone] 04/13/2017, 11:36 AM\ I have personally examined this patient, reviewed notes, independently viewed imaging studies, participated in medical decision making and plan of care.ROS completed by me personally and pertinent positives fully documented  I have made any additions or clarifications directly to the above note. Agree with note above.  She has presented with stuttering speech difficulties and right-sided weakness due to left brain subcortical lesion which is diffusion positive this could represent large subcortical left MCA territory infarct though given the fact that she has multiple sclerosis and has been on long-term Tysabri progressive multifocal leukoencephalopathy is also a consideration. Unfortunately patient has not completed the complete MRI to help this differentiation and is refusing to go back and have a postcontrast study. Recommend checking CT angiogram to look at her intracranial vasculature and if no significant MCA or carotid disease is found in the left side may consider doing MRI scan under general anesthesia and checking spinal fluid for GC virus antibodies. Case discussed with Dr. Epimenio Foot patient's neurologist and Dr. Benjamine Mola. Greater than 50% time during the study foramina to visit was spent on counseling and coordination of care about her neurological presentation and plan for evaluation and treatment discussion. ADDENDUM :  Patients MRI images reviewed with  neuroradiologist Dr  Benard Rink as well as with Dr. Epimenio Foot patient's neurologist. Possibility of PML exists given patient's immunocompromised state and being on Tysabri for so long. Recommend strong need to complete the MRI with contrast and patient now agreeable to do so under anesthesia. She will also need spinal tap to look for JC virus for PML.Discussed with Dr. Benjamine Mola who will arrange this. Delia Heady, MD Medical Director [facility] Pager: [phone] 04/13/2017 4:20 PM  To contact Stroke Continuity provider, please refer to [email or web address]. After hours, contact General Neurology

## 2017-04-13 NOTE — ED Notes (Signed)
Report attempted x 1

## 2017-04-13 NOTE — ED Notes (Signed)
Pt back in room from MRI.

## 2017-04-13 NOTE — H&P (Signed)
History and Physical    Rachel Gilbert ZOX:[id number] DOB: 1960-03-01 DOA: 04/12/2017  Referring MD/NP/PA: Elpidio Anis, PA-C PCP: Josph Macho, FNP  Patient coming from: Home  Chief Complaint: Altered mental status  I have personally briefly reviewed patient's old medical records in [facility]   HPI: Rachel Gilbert is a 57 y.o. female with medical history significant of HTN, MS, OSA on CPAP, and TIA; who presents with altered mental status.  The patient's friend helps provide additional history.  Around 8:10 a.m. 5 days ago(3/13), the patient had called her best friend and was noted to have significantly slurred speech at that time over the phone.  She was also noted to have a mild right-sided facial droop.  She was taken to [facility] regional and evaluated for stroke versus MS flare.  CT scan showed decreased attenuation in the periventricular white matter adjacent to the frontal horns or demyelination versus small vessel disease could not be delineated.  Neurology was consulted and it was decided to give the patient high-dose steroids which she received for 3 days. MRI was recommended but cannot be obtained.  Patient seem to be slightly improved, but speech was still somewhat slurred when she was discharged home on 3/16.  Yesterday patient was found to have worsening slurred speech with inability to work on and very emotional which was unusual for her.  No upper or lower extremity limb weakness noted and at baseline patient is ambulatory.  Patient has not been keeping herself well-hydrated.  She was scheduled to see her neurologist Dr. Harlan Stains later this week and was set to have her infusion of natalizumab on 3/22.  ED Course: On admission to the emergency department patient was seen to be afebrile, pulse 56-72, respirations 13-22, blood pressures 127/71-188/114, O2 saturations maintained on room air.  Code stroke was not initially called due to duration and symptoms.  Labs revealed WBC 16,  hemoglobin 15.3, CO2 20, BUN 39, creatinine 1.4, glucose 112, and troponin 0.  CT showed increasing low-attenuation area in the left frontal white matter concerning for evolving subacute stroke in left MCA distribution.  Patient was given 1 mg of Ativan for MRI procedure.  Follow-up MRI with and without contrast was ordered, but could not be obtained at this time as MRI to handle patient's weight was not available.  Dr. Christell Constant was consulted and will evaluate the patient.   Review of Systems  Constitutional: Negative for chills and fever.  HENT: Negative for congestion and ear discharge.   Eyes: Negative for blurred vision and double vision.  Respiratory: Negative for cough and sputum production.   Cardiovascular: Negative for chest pain and leg swelling.  Gastrointestinal: Negative for abdominal pain, nausea and vomiting.  Genitourinary: Negative for dysuria and frequency.  Musculoskeletal: Negative for falls, joint pain and myalgias.  Skin: Negative for rash.  Neurological: Positive for speech change. Negative for seizures.  Psychiatric/Behavioral: Negative for hallucinations and substance abuse.    Past Medical History:  Diagnosis Date  . Complication of anesthesia    hard time waking up   . Hearing loss   . Hypertension   . Kidney stones   . Multiple sclerosis (HCC)   . Sleep apnea    Uses CPAP  . Vision abnormalities     Past Surgical History:  Procedure Laterality Date  . CESAREAN SECTION  1986  . CHOLECYSTECTOMY  2000   laparoscopic     reports that  has never smoked. she has never used smokeless tobacco.  She reports that she drinks alcohol. She reports that she does not use drugs.  Allergies  Allergen Reactions  . Oxycodone Anaphylaxis    Family History  Problem Relation Age of Onset  . Fibromyalgia Mother   . Stroke Father   . Diabetes Father   . Stroke Other     Prior to Admission medications   Medication Sig Start Date End Date Taking? Authorizing  Provider  acetaminophen (TYLENOL) 500 MG tablet Take 500 mg by mouth every 6 (six) hours as needed for mild pain.    [provider]  ALPRAZolam Prudy Feeler) 0.5 MG tablet Take 0.5 mg by mouth 2 (two) times daily as needed for anxiety.    [provider]  aspirin EC 81 MG EC tablet Take 1 tablet (81 mg total) by mouth daily. 04/12/17   Milagros Loll, MD  atorvastatin (LIPITOR) 40 MG tablet Take 1 tablet (40 mg total) by mouth daily at 6 PM. 04/11/17   Milagros Loll, MD  baclofen (LIORESAL) 10 MG tablet Take 1 tablet (10 mg total) by mouth 3 (three) times daily as needed for muscle spasms. 09/19/14   Sater, Pearletha Furl, MD  carvedilol (COREG) 25 MG tablet Take 25 mg by mouth 2 (two) times daily with a meal.    [provider]  Cholecalciferol (VITAMIN D3) 5000 units CAPS Take 1 capsule by mouth daily.    [provider]  FLUoxetine (PROZAC) 40 MG capsule Take 1 capsule (40 mg total) by mouth daily. 06/07/14   Gale Journey, MD  hydrALAZINE (APRESOLINE) 50 MG tablet Take 50 mg by mouth 2 (two) times daily.    [provider]  ibuprofen (ADVIL,MOTRIN) 200 MG tablet Take 200 mg by mouth every 6 (six) hours as needed for moderate pain.    [provider]  lisinopril (PRINIVIL,ZESTRIL) 40 MG tablet Take 40 mg by mouth daily.    [provider]  methylphenidate (RITALIN) 10 MG tablet Take 2 pills qAM and one pill po 4 hours later 02/10/17   Sater, Pearletha Furl, MD  natalizumab (TYSABRI) 300 MG/15ML injection Inject 15 mLs into the vein every 28 (twenty-eight) days.     [provider]  ondansetron (ZOFRAN-ODT) 4 MG disintegrating tablet Take 4 mg by mouth every 8 (eight) hours as needed for nausea or vomiting.    [provider]  oxybutynin (DITROPAN XL) 15 MG 24 hr tablet Take 1 tablet (15 mg total) by mouth at bedtime. 05/14/16   Sater, Pearletha Furl, MD  ranitidine (ZANTAC) 300 MG tablet Take 300 mg by mouth every evening.    [provider]  spironolactone (ALDACTONE) 50 MG tablet Take 50 mg by mouth daily.    [provider]  traZODone (DESYREL) 100 MG tablet Take 1 tablet (100 mg total) by mouth at bedtime. 11/16/14   Sater, Pearletha Furl, MD    Physical Exam:  Constitutional: female with central obesity  in no acute distress lethargic. Vitals:   04/12/17 2230 04/12/17 2315 04/12/17 2330 04/12/17 2345  BP: (!) 159/88 127/71 (!) 161/79 (!) 142/73  Pulse: 61 (!) 56 60   Resp: 18 19 20    Temp:      SpO2: 97% 95% 97%   Weight:      Height:       Eyes: PERRL, lids and conjunctivae normal ENMT: Mucous membranes are dry. Posterior pharynx clear of any exudate or lesions. Neck: normal, supple, no masses, no thyromegaly Respiratory: clear to auscultation bilaterally, no  wheezing, no crackles. Normal respiratory effort. No accessory muscle use.  Cardiovascular: Regular rate and rhythm, no murmurs / rubs / gallops. No extremity edema. 2+ pedal pulses. No carotid bruits.  Abdomen: no tenderness, no masses palpated. No hepatosplenomegaly. Bowel sounds positive.  Musculoskeletal: no clubbing / cyanosis. No joint deformity upper and lower extremities. Good ROM, no contractures. Normal muscle tone.  Skin: no rashes, lesions, ulcers. No induration Neurologic: CN 2-12 grossly intact.  Mild right-sided facial droop with slurred speech. strength 5/5 in all 4.  Psychiatric: Normal judgment and insight.  Lethargic and oriented x 3. Normal mood.     Labs on Admission: I have personally reviewed following labs and imaging studies  CBC: Recent Labs  Lab 04/08/17 0943 04/12/17 1954 04/12/17 1959  WBC 10.5 16.0*  --   NEUTROABS 7.5* 11.6*  --   HGB 14.8 15.0 15.3*  HCT 44.6 47.8* 45.0  MCV 88.5 92.3  --   PLT 237 264  --    Basic Metabolic Panel: Recent Labs  Lab 04/08/17 0943 04/12/17 1954 04/12/17 1959  NA 141 137 143  K 3.7 3.6 3.6  CL 108 106 109  CO2 23 20*  --   GLUCOSE 147* 124* 112*  BUN 14 39*  39*  CREATININE 1.07* 1.33* 1.40*  CALCIUM 9.1 8.5*  --    GFR: Estimated Creatinine Clearance: 63.6 mL/min (A) (by C-G formula based on SCr of 1.4 mg/dL (H)). Liver Function Tests: Recent Labs  Lab 04/08/17 0943 04/12/17 1954  AST 26 22  ALT 22 24  ALKPHOS 75 70  BILITOT 1.0 0.8  PROT 7.0 6.2*  ALBUMIN 4.2 3.6   No results for input(s): LIPASE, AMYLASE in the last 168 hours. No results for input(s): AMMONIA in the last 168 hours. Coagulation Profile: Recent Labs  Lab 04/08/17 0943 04/12/17 1954  INR 0.92 1.02   Cardiac Enzymes: Recent Labs  Lab 04/08/17 0943  TROPONINI <0.03   BNP (last 3 results) No results for input(s): PROBNP in the last 8760 hours. HbA1C: No results for input(s): HGBA1C in the last 72 hours. CBG: No results for input(s): GLUCAP in the last 168 hours. Lipid Profile: No results for input(s): CHOL, HDL, LDLCALC, TRIG, CHOLHDL, LDLDIRECT in the last 72 hours. Thyroid Function Tests: No results for input(s): TSH, T4TOTAL, FREET4, T3FREE, THYROIDAB in the last 72 hours. Anemia Panel: No results for input(s): VITAMINB12, FOLATE, FERRITIN, TIBC, IRON, RETICCTPCT in the last 72 hours. Urine analysis:    Component Value Date/Time   COLORURINE COLORLESS (A) 04/08/2017 0943   APPEARANCEUR CLEAR (A) 04/08/2017 0943   LABSPEC 1.003 (L) 04/08/2017 0943   PHURINE 7.0 04/08/2017 0943   GLUCOSEU NEGATIVE 04/08/2017 0943   HGBUR NEGATIVE 04/08/2017 0943   BILIRUBINUR NEGATIVE 04/08/2017 0943   KETONESUR NEGATIVE 04/08/2017 0943   PROTEINUR NEGATIVE 04/08/2017 0943   NITRITE NEGATIVE 04/08/2017 0943   LEUKOCYTESUR NEGATIVE 04/08/2017 0943   Sepsis Labs: No results found for this or any previous visit (from the past 240 hour(s)).   Radiological Exams on Admission: Ct Head Wo Contrast  Result Date: 04/12/2017 CLINICAL DATA:  57 year old female with slurred speech and elevated blood pressure. EXAM: CT HEAD WITHOUT CONTRAST TECHNIQUE: Contiguous axial  images were obtained from the base of the skull through the vertex without intravenous contrast. COMPARISON:  Head CT 04/08/2017. FINDINGS: Brain: Patchy and confluent areas of decreased attenuation are noted throughout the deep and periventricular white matter of the cerebral hemispheres bilaterally, compatible with chronic microvascular ischemic  disease. In addition, in the left frontal lobe white matter there is a more well-defined area of low attenuation (axial image 20 of series 3 and coronal image 33 of series 5) which is new compared to the prior examination from 04/08/2017, concerning for an area of subacute ischemia. No evidence of acute hemorrhage, hydrocephalus, extra-axial collection or mass lesion/mass effect. Vascular: No hyperdense vessel or unexpected calcification. Skull: Normal. Negative for fracture or focal lesion. Sinuses/Orbits: No acute finding. Other: None. IMPRESSION: 1. Area of increasing low-attenuation in the left frontal lobe white matter concerning for evolving subacute ischemia (axial image 20 of series 3 and coronal image 33 of series 5) in the left MCA distribution. This could be confirmed with brain MRI without contrast if clinically appropriate. 2. Chronic microvascular ischemic changes in cerebral white matter. These results were called by telephone at the time of interpretation on 04/12/2017 at 9:11 pm to Dr. Raeford Razor, who verbally acknowledged these results. Electronically Signed   By: Trudie Reed M.D.   On: 04/12/2017 21:14    EKG: Independently reviewed.  Normal sinus rhythm at 74 bpm  Assessment/Plan CVA, dysphagia, apraxia: Subacute.  Patient presents with persistent symptoms of dysphagia.  Seen at [facility] on 3/13 for the same.  She underwent echocardiogram showing EF of 55-65%, bilateral carotid ultrasound showing less than 50% stenosis repeat CT scan shows area of concern in the left frontal lobe in the distribution of the left MCA for  subacute stroke.  Patient unable to have MRI.  Neurology was consulted and will see the patient. - Admit to telemetry bed - Neurochecks - Follow-up UDS, ethanol level  - PT/OT/speech consult - Aspirin - Appreciate neurology consultative services, will follow-up for further recommendations  Acute kidney injury secondary to dehydration: Patient's creatinine previously appears to have been around 1.07 on 04/08/17 but noted to be 1.1 admission with BUN 39.  Suspect prerenal cause of symptoms. - Bolused 2 L of normal saline IV fluids, then placed at a rate of 100 mL/h as tolerated - Recheck BMP  Leukocytosis: Acute.  Initial WBC 16.  Patient was recently treated with 3 days of high-dose steroids for possible MS. - Follow-up urinalysis  Multiple sclerosis: Patient followed by Dr. Marzetta Merino neurology in the outpatient setting and takes natalizumab. - Continue baclofen - May want to notify Dr. Harlan Stains that the patient is admitted to the hospital in a.m.  Essential hypertension - Continue Coreg and hydralazine - Hold Spironolactone and lisinopril  Hyperlipidemia: Patient just was evaluated with - Continue atorvastatin  OSA on CPAP -  CPAP per RT  Morbid obesity: BMI 40.22  DVT prophylaxis: Lovenox Code Status: Full Family Communication: Discussed plan of care with the patient and family present at bedside Disposition Plan: TBD  Consults called: Neurology  Admission status: Inpatient  Clydie Braun MD [facility] Pager [phone]   If 7PM-7AM, please contact night-coverage [email or web address] Password [email or web address]  04/13/2017, 12:32 AM

## 2017-04-13 NOTE — Evaluation (Addendum)
Occupational Therapy Evaluation Patient Details Name: Rachel Gilbert MRN: [id number] DOB: May 17, 1960 Today's Date: 04/13/2017    History of Present Illness  Rachel Gilbert is a 57 y.o. female with medical history significant of HTN, MS, HOH, OSA on CPAP, and TIA; who presents with altered mental status, slurred speech, wording finding,    Clinical Impression   This 57 yo female admitted with above presents to acute with decreased balance, mild decreased coordination of RUE, and expressive difficulties thus affecting her safety and independence with basic ADLs. She will benefit from acute OT with follow up OT on CIR to get back to her PLOF of being totally independent and helping care for another person. Vital signs stable. Pt wheezing and reports she normally does this, but it is worse now.    Follow Up Recommendations  CIR;Supervision/Assistance - 24 hour    Equipment Recommendations  None recommended by OT       Precautions / Restrictions Precautions Precautions: Fall Restrictions Weight Bearing Restrictions: No      Mobility Bed Mobility Overal bed mobility: Independent                Transfers Overall transfer level: Needs assistance Equipment used: None Transfers: Sit to/from Stand Sit to Stand: Min guard         General transfer comment: Needed min A for ambulation from bed to bathroom as well as she steadied herself on sink    Balance Overall balance assessment: Needs assistance Sitting-balance support: No upper extremity supported;Feet supported Sitting balance-Leahy Scale: Good     Standing balance support: Single extremity supported Standing balance-Leahy Scale: Poor Standing balance comment: held onto grab bar for toileting hygiene and propped against sink for washing hand                           ADL either performed or assessed with clinical judgement   ADL Overall ADL's : Needs assistance/impaired Eating/Feeding: Modified  independent Eating/Feeding Details (indicate cue type and reason): increased time Grooming: Min guard;Standing   Upper Body Bathing: Supervision/ safety;Sitting;Set up   Lower Body Bathing: Moderate assistance Lower Body Bathing Details (indicate cue type and reason): min guard A sit<>stand Upper Body Dressing : Set up;Supervision/safety;Sitting Upper Body Dressing Details (indicate cue type and reason): increased time due to do decreased coordination of RUE Lower Body Dressing: Moderate assistance Lower Body Dressing Details (indicate cue type and reason): min guard A sit<>stand Toilet Transfer: Minimal assistance;Ambulation;Regular Toilet;Grab bars   Toileting- Clothing Manipulation and Hygiene: Min guard;Sit to/from stand               Vision Baseline Vision/History: No visual deficits Patient Visual Report: No change from baseline Vision Assessment?: Yes Eye Alignment: Within Functional Limits Ocular Range of Motion: Within Functional Limits Alignment/Gaze Preference: Within Defined Limits Tracking/Visual Pursuits: Able to track stimulus in all quads without difficulty Convergence: Within functional limits Visual Fields: No apparent deficits            Pertinent Vitals/Pain Pain Assessment: No/denies pain     Hand Dominance Right   Extremity/Trunk Assessment Upper Extremity Assessment Upper Extremity Assessment: RUE deficits/detail RUE Deficits / Details: Noted mild decreased coordination and speed compared to LUE for opposition and finger to nose           Communication Communication Communication: Expressive difficulties   Cognition Arousal/Alertness: Awake/alert Behavior During Therapy: WFL for tasks assessed/performed Overall Cognitive Status: Impaired/Different from baseline Area of Impairment: Following  commands                               General Comments: At times needed gestural and demonstration cues for following commands               Home Living Family/patient expects to be discharged to:: Private residence Living Arrangements: Alone(sister for heavy cleaning; per [name] (her friend, pt's mother can stay with pt as long pt is S level or better))   Type of Home: House Home Access: Ramped entrance     Home Layout: One level     Bathroom Shower/Tub: Tub/shower unit         Home Equipment: Cane - single point;Grab bars - tub/shower;Shower seat      Lives With: Alone    Prior Functioning/Environment Level of Independence: Independent        Comments: As of recent has been staying with a lady from church who had surgery and helping her out        OT Problem List: Decreased coordination;Impaired balance (sitting and/or standing);Impaired UE functional use;Obesity         OT Goals(Current goals can be found in the care plan section) Acute Rehab OT Goals Patient Stated Goal: pt unable to state, but did shake head to wanting to be able to talk OT Goal Formulation: With patient Time For Goal Achievement: 04/27/17 Potential to Achieve Goals: Good  OT Frequency: Min 3X/week              AM-PAC PT "6 Clicks" Daily Activity     Outcome Measure Help from another person eating meals?: None Help from another person taking care of personal grooming?: A Little Help from another person toileting, which includes using toliet, bedpan, or urinal?: A Little Help from another person bathing (including washing, rinsing, drying)?: A Little Help from another person to put on and taking off regular upper body clothing?: A Little Help from another person to put on and taking off regular lower body clothing?: A Lot 6 Click Score: 18   End of Session Equipment Utilized During Treatment: Gait belt Nurse Communication: (pt needs purewick replaced, Cpap back on, +1 min A for mobility, had a bowel movement )  Activity Tolerance: Patient tolerated treatment well Patient left: in bed;with call bell/phone within  reach;with bed alarm set  OT Visit Diagnosis: Unsteadiness on feet (R26.81);Cognitive communication deficit (R41.841)                Time: 6967-8938 OT Time Calculation (min): 42 min Charges:  OT General Charges $OT Visit: 1 Visit OT Evaluation $OT Eval Moderate Complexity: 1 Mod OT Treatments $Self Care/Home Management : 23-37 mins Ignacia Palma, OTR/L [phone] 04/13/2017

## 2017-04-13 NOTE — Progress Notes (Signed)
Preliminary results by tech - TCD bubble study completed. Dr. Pearlean Brownie performed. No HITS heard at rest or during Valsalva. Negative for PFO. Marilynne Halsted, BS, RDMS, RVT

## 2017-04-14 ENCOUNTER — Inpatient Hospital Stay (HOSPITAL_COMMUNITY): Payer: Medicare HMO

## 2017-04-14 DIAGNOSIS — G35 Multiple sclerosis: Secondary | ICD-10-CM

## 2017-04-14 DIAGNOSIS — D849 Immunodeficiency, unspecified: Secondary | ICD-10-CM

## 2017-04-14 LAB — HOMOCYSTEINE: HOMOCYSTEINE-NORM: 16 umol/L — AB (ref 0.0–15.0)

## 2017-04-14 LAB — CBC
HEMATOCRIT: 42.4 % (ref 36.0–46.0)
HEMOGLOBIN: 13.4 g/dL (ref 12.0–15.0)
MCH: 29.2 pg (ref 26.0–34.0)
MCHC: 31.6 g/dL (ref 30.0–36.0)
MCV: 92.4 fL (ref 78.0–100.0)
Platelets: 196 10*3/uL (ref 150–400)
RBC: 4.59 MIL/uL (ref 3.87–5.11)
RDW: 15.3 % (ref 11.5–15.5)
WBC: 9.3 10*3/uL (ref 4.0–10.5)

## 2017-04-14 LAB — BETA-2-GLYCOPROTEIN I ABS, IGG/M/A: Beta-2-Glycoprotein I IgM: <9 GPI IgM units (ref 0–32)

## 2017-04-14 LAB — CARDIOLIPIN ANTIBODIES, IGG, IGM, IGA
Anticardiolipin IgA: <9 APL U/mL (ref 0–11)
Anticardiolipin IgG: <9 GPL U/mL (ref 0–14)
Anticardiolipin IgM: <9 MPL U/mL (ref 0–12)

## 2017-04-14 LAB — BASIC METABOLIC PANEL
ANION GAP: 10 (ref 5–15)
BUN: 26 mg/dL — ABNORMAL HIGH (ref 6–20)
CO2: 24 mmol/L (ref 22–32)
Calcium: 8.3 mg/dL — ABNORMAL LOW (ref 8.9–10.3)
Chloride: 107 mmol/L (ref 101–111)
Creatinine, Ser: 1.01 mg/dL — ABNORMAL HIGH (ref 0.44–1.00)
GFR calc Af Amer: >60 mL/min (ref >60)
GLUCOSE: 100 mg/dL — AB (ref 65–99)
POTASSIUM: 3.3 mmol/L — AB (ref 3.5–5.1)
Sodium: 141 mmol/L (ref 135–145)

## 2017-04-14 LAB — PROTEIN C, TOTAL: PROTEIN C, TOTAL: 90 % (ref 60–150)

## 2017-04-14 NOTE — Consult Note (Signed)
Chief Complaint: Patient was seen in consultation today for multiple sclerosis  Referring Physician(s): Dr. Pearlean Brownie  Supervising Physician: Oley Balm  Patient Status: [facility] - In-pt  History of Present Illness: Rachel Gilbert is a 57 y.o. female with past medical history of HTN, sleep apnea, MS who presents with speech difficulties and labile emotions. Patient initially presented 04/08/17 with acute symptoms.  CT scan at the time showed decreased attenuation in the periventricular white matter adjacent to the frontal horns or demyelination versus small vessel disease could not be delineated. Neurology was consulted and it was decided to givethe patient high-dose steroids which she received for 3 days.  She was discharged home but returned with worsening symptoms. A repeat CT Head showed area of increasing low-attenuation in the left frontal lobe white matter concerning for evolving subacute ischemia (axial image 20 of series 3 and coronal image 33 of series 5) in the left MCA distribution.  She has undergone further evaluation with Neurology who now requests a lumbar puncture for possible JC virus.  Patient requests MRI and LP be performed under general anesthesia.   Past Medical History:  Diagnosis Date  . Complication of anesthesia    hard time waking up   . Hearing loss   . Hypertension   . Kidney stones   . Multiple sclerosis (HCC)   . Sleep apnea    Uses CPAP  . Vision abnormalities     Past Surgical History:  Procedure Laterality Date  . CESAREAN SECTION  1986  . CHOLECYSTECTOMY  2000   laparoscopic    Allergies: Oxycodone  Medications: Prior to Admission medications   Medication Sig Start Date End Date Taking? Authorizing Provider  acetaminophen (TYLENOL) 500 MG tablet Take 500 mg by mouth every 6 (six) hours as needed for mild pain.   Yes [provider]  ALPRAZolam Prudy Feeler) 0.5 MG tablet Take 0.5 mg by mouth 2 (two) times daily as needed for anxiety.    Yes [provider]  atorvastatin (LIPITOR) 40 MG tablet Take 1 tablet (40 mg total) by mouth daily at 6 PM. 04/11/17  Yes Sudini, Forensic scientist, MD  carvedilol (COREG) 25 MG tablet Take 25 mg by mouth 2 (two) times daily with a meal.   Yes [provider]  hydrALAZINE (APRESOLINE) 50 MG tablet Take 50 mg by mouth 2 (two) times daily.   Yes [provider]  ibuprofen (ADVIL,MOTRIN) 200 MG tablet Take 200 mg by mouth every 6 (six) hours as needed for moderate pain.   Yes [provider]  lisinopril (PRINIVIL,ZESTRIL) 40 MG tablet Take 40 mg by mouth daily.   Yes [provider]  ranitidine (ZANTAC) 300 MG tablet Take 300 mg by mouth every evening.   Yes [provider]  spironolactone (ALDACTONE) 50 MG tablet Take 50 mg by mouth daily.   Yes [provider]  aspirin EC 81 MG EC tablet Take 1 tablet (81 mg total) by mouth daily. 04/12/17   Milagros Loll, MD  baclofen (LIORESAL) 10 MG tablet Take 1 tablet (10 mg total) by mouth 3 (three) times daily as needed for muscle spasms. Patient not taking: Reported on 04/13/2017 09/19/14   Asa Lente, MD  Cholecalciferol (VITAMIN D3) 5000 units CAPS Take 5,000 Units by mouth daily.     [provider]  FLUoxetine (PROZAC) 40 MG capsule Take 1 capsule (40 mg total) by mouth daily. Patient not taking: Reported on 04/13/2017 06/07/14   Gale Journey, MD  methylphenidate (  RITALIN) 10 MG tablet Take 2 pills qAM and one pill po 4 hours later 02/10/17   Sater, Pearletha Furl, MD  natalizumab (TYSABRI) 300 MG/15ML injection Inject 15 mLs into the vein every 28 (twenty-eight) days.     [provider]  oxybutynin (DITROPAN XL) 15 MG 24 hr tablet Take 1 tablet (15 mg total) by mouth at bedtime. Patient not taking: Reported on 04/13/2017 05/14/16   Asa Lente, MD     Family History  Problem Relation Age of Onset  . Fibromyalgia Mother   . Stroke Father   . Diabetes Father   . Stroke  Other     Social History   Socioeconomic History  . Marital status: Single    Spouse name: None  . Number of children: None  . Years of education: None  . Highest education level: None  Social Needs  . Financial resource strain: None  . Food insecurity - worry: None  . Food insecurity - inability: None  . Transportation needs - medical: None  . Transportation needs - non-medical: None  Occupational History  . Occupation: disabled  Tobacco Use  . Smoking status: Never Smoker  . Smokeless tobacco: Never Used  Substance and Sexual Activity  . Alcohol use: Yes    Comment: Occasional  . Drug use: No  . Sexual activity: None  Other Topics Concern  . None  Social History Narrative  . None     Review of Systems: A 12 point ROS discussed and pertinent positives are indicated in the HPI above.  All other systems are negative.  Review of Systems  Constitutional: Negative for fatigue and fever.  Respiratory: Negative for cough and shortness of breath.   Cardiovascular: Negative for chest pain.  Gastrointestinal: Negative for abdominal pain.  Neurological: Positive for speech difficulty.  Psychiatric/Behavioral: Negative for behavioral problems and confusion.    Vital Signs: BP (!) 154/84 (BP Location: Right Arm)   Pulse 72   Temp 97.9 F (36.6 C) (Oral)   Resp 16   Ht 5\' 9"  (1.753 m)   Wt 272 lb 7.8 oz (123.6 kg)   LMP 01/15/2015   SpO2 97%   BMI 40.24 kg/m   Physical Exam  Constitutional: She is oriented to person, place, and time. She appears well-developed.  Cardiovascular: Normal rate, regular rhythm and normal heart sounds.  Pulmonary/Chest: Effort normal and breath sounds normal. No respiratory distress.  Abdominal: Soft.  Neurological: She is alert and oriented to person, place, and time.  Facial droop present.  Word-finding difficulties.  Cries during conversation.   Skin: Skin is warm and dry.  Psychiatric: She has a normal mood and affect. Her behavior  is normal. Judgment and thought content normal.  Nursing note and vitals reviewed.      Imaging: Ct Angio Head W Or Wo Contrast  Addendum Date: 04/14/2017   ADDENDUM REPORT: 04/14/2017 13:57 ADDENDUM: On further review of this CTA in the subsequently performed MRI, the following finding is noted. There is severe stenosis of 1 of the left opercular (M3 segment) MCA branches, best demonstrated on series 13, image 27 and series 12, image 21. This location is compatible with the area of infarction demonstrated on the MRI performed later the same day. Electronically Signed   By: Deatra Robinson M.D.   On: 04/14/2017 13:57   Result Date: 04/14/2017 CLINICAL DATA:  Stroke follow-up EXAM: CT ANGIOGRAPHY HEAD TECHNIQUE: Multidetector CT imaging of the head was performed using the standard protocol during  bolus administration of intravenous contrast. Multiplanar CT image reconstructions and MIPs were obtained to evaluate the vascular anatomy. CONTRAST:  50 mL Isovue 370 COMPARISON:  Head CT 04/12/2017 FINDINGS: Anterior circulation: --Intracranial internal carotid arteries: Normal. --Anterior cerebral arteries: Normal. Both A1 segments are present. --Middle cerebral arteries: Normal. --Posterior communicating arteries: Present on the left, absent on the right. Posterior circulation: --Basilar artery: Normal. --Posterior cerebral arteries: Moderate stenosis of the right P1 and proximal P2 segments. Fetal origin of the left PCA. --Superior cerebellar arteries: Normal. --Inferior cerebellar arteries: Normal anterior and posterior inferior cerebellar arteries. Venous sinuses: As permitted by contrast timing, patent. Anatomic variants: None Delayed phase: There are areas of faint hypoattenuation in the left frontal white matter corresponding to the ischemia demonstrated on the prior MRI. Review of the MIP images confirms the above findings. IMPRESSION: 1. No emergent large vessel occlusion. 2. Moderate stenosis of the  right posterior cerebral artery P1 and proximal P2 segments. 3. Hypoattenuation in the left frontal white matter consistent with known recent infarct. Electronically Signed: By: Deatra Robinson M.D. On: 04/13/2017 18:06   Ct Head Wo Contrast  Result Date: 04/12/2017 CLINICAL DATA:  57 year old female with slurred speech and elevated blood pressure. EXAM: CT HEAD WITHOUT CONTRAST TECHNIQUE: Contiguous axial images were obtained from the base of the skull through the vertex without intravenous contrast. COMPARISON:  Head CT 04/08/2017. FINDINGS: Brain: Patchy and confluent areas of decreased attenuation are noted throughout the deep and periventricular white matter of the cerebral hemispheres bilaterally, compatible with chronic microvascular ischemic disease. In addition, in the left frontal lobe white matter there is a more well-defined area of low attenuation (axial image 20 of series 3 and coronal image 33 of series 5) which is new compared to the prior examination from 04/08/2017, concerning for an area of subacute ischemia. No evidence of acute hemorrhage, hydrocephalus, extra-axial collection or mass lesion/mass effect. Vascular: No hyperdense vessel or unexpected calcification. Skull: Normal. Negative for fracture or focal lesion. Sinuses/Orbits: No acute finding. Other: None. IMPRESSION: 1. Area of increasing low-attenuation in the left frontal lobe white matter concerning for evolving subacute ischemia (axial image 20 of series 3 and coronal image 33 of series 5) in the left MCA distribution. This could be confirmed with brain MRI without contrast if clinically appropriate. 2. Chronic microvascular ischemic changes in cerebral white matter. These results were called by telephone at the time of interpretation on 04/12/2017 at 9:11 pm to Dr. Raeford Razor, who verbally acknowledged these results. Electronically Signed   By: Trudie Reed M.D.   On: 04/12/2017 21:14   Ct Head Wo Contrast  Result Date:  04/08/2017 CLINICAL DATA:  Altered mental status.  Headache.  Hypertension. EXAM: CT HEAD WITHOUT CONTRAST TECHNIQUE: Contiguous axial images were obtained from the base of the skull through the vertex without intravenous contrast. COMPARISON:  July 02, 2016 brain MRI FINDINGS: Brain: The ventricles are normal in size and configuration. There is no intracranial mass, hemorrhage, extra-axial fluid collection, or midline shift. Decreased attenuation is noted in the centra semiovale bilaterally. This appearance is essentially stable compared to prior MR. No new gray-white compartment lesion is demonstrated on this study. No acute infarct is appreciable. Vascular: No hyperdense vessel. There is calcification in each carotid siphon. Skull: Bony calvarium appears intact. Sinuses/Orbits: There is mucosal thickening in several ethmoid air cells. Other paranasal sinuses which are visualized are clear. Orbits appear symmetric bilaterally. Other: Mastoid air cells are clear. A small protein containing cyst is noted  in the soft tissues immediately adjacent to the superior frontal bone measuring 1 x 1 cm. IMPRESSION: Decreased attenuation in the periventricular white matter adjacent to the frontal horns is again noted. Question small vessel disease versus demyelination. Both entities may exist concurrently. Appearance is essentially stable compared to prior MR. No acute infarct evident. No mass or hemorrhage. There are foci of arteriovascular calcification. There is mucosal thickening in several ethmoid air cells. Electronically Signed   By: Bretta Bang III M.D.   On: 04/08/2017 09:43   Mr Brain Wo Contrast  Result Date: 04/13/2017 CLINICAL DATA:  New onset of slurred speech, without definite weakness. History of hypertension. History of multiple sclerosis. EXAM: MRI HEAD WITHOUT CONTRAST TECHNIQUE: Multiplanar, multiecho pulse sequences of the brain and surrounding structures were obtained without intravenous contrast.  COMPARISON:  CT head 04/08/2017. CT head 04/12/2017. MRI 07/02/2016 and 11/29/2014. FINDINGS: The patient prematurely truncated the exam after diffusion, sagittal T1, and axial T2 images were obtained. Sensitivity and specificity are reduced. Brain: Multiple areas of both focal and confluent restricted diffusion affect the LEFT frontal cortex and subcortical white matter, with slight involvement of the superior insula, possibly the superior lentiform nucleus, consistent with acute infarction. These correspond to LEFT MCA territory ischemia. No hemorrhage, mass lesion, hydrocephalus, or extra-axial fluid. Mild premature for age cerebral volume loss. Numerous periventricular white matter lesions are observed, likely greater than 10, with corresponding volume loss of the corpus callosum. None definitely demonstrate restriction. Medial LEFT thalamic T2 hyperintense lesion could be ischemic or related to chronic demyelination. Similarly, LEFT external capsule lesions could be ischemic or demyelinating in origin. These are incompletely evaluated given the premature termination of the study. Vascular: Normal flow voids. Skull and upper cervical spine: Normal marrow signal Sinuses/Orbits: Chronic sinus disease. Symmetric and negative orbits except for proptosis. Other: None. Compared with prior MRs, similar appearance to the suspected demyelinating lesions. IMPRESSION: The patient refused to continue the exam, resulting in an incomplete study. Sensitivity and specificity are reduced. Multiple areas of both focal and confluent restricted diffusion within the LEFT MCA territory, LEFT frontal predominant, representing nonhemorrhagic acute infarction, corresponding with the recent CT. Multiple white matter lesions, periventricular predominant, consistent with chronic multiple sclerosis. Electronically Signed   By: Elsie Stain M.D.   On: 04/13/2017 10:21   US Carotid Bilateral (at [facility] Only)  Result Date:  04/08/2017 CLINICAL DATA:  TIA EXAM: BILATERAL CAROTID DUPLEX ULTRASOUND TECHNIQUE: Wallace Cullens scale imaging, color Doppler and duplex ultrasound were performed of bilateral carotid and vertebral arteries in the neck. COMPARISON:  None. FINDINGS: Criteria: Quantification of carotid stenosis is based on velocity parameters that correlate the residual internal carotid diameter with NASCET-based stenosis levels, using the diameter of the distal internal carotid lumen as the denominator for stenosis measurement. The following velocity measurements were obtained: RIGHT ICA:  112 cm/sec CCA:  102 cm/sec SYSTOLIC ICA/CCA RATIO:  1.1 DIASTOLIC ICA/CCA RATIO:  1.2 ECA:  147 cm/sec LEFT ICA:  75 cm/sec CCA:  98 cm/sec SYSTOLIC ICA/CCA RATIO:  0.8 DIASTOLIC ICA/CCA RATIO:  0.7 ECA:  145 cm/sec RIGHT CAROTID ARTERY: Minimal intimal thickening in the bulb. Low resistance internal carotid Doppler pattern. RIGHT VERTEBRAL ARTERY:  Antegrade. LEFT CAROTID ARTERY: Little if any plaque in the bulb. Low resistance internal carotid Doppler pattern. LEFT VERTEBRAL ARTERY:  Antegrade. IMPRESSION: Less than 50% stenosis in the right and left internal carotid arteries. Electronically Signed   By: Jolaine Click M.D.   On: 04/08/2017 15:21  Dg Chest Portable 1 View  Result Date: 04/13/2017 CLINICAL DATA:  Cough.  Possible aspiration. EXAM: PORTABLE CHEST 1 VIEW COMPARISON:  Frontal and lateral views 06/04/2014 FINDINGS: Unchanged prominent cardiac silhouette. Minimal bibasilar atelectasis. No pulmonary edema, confluent airspace disease, large pleural effusion or pneumothorax. Body habitus and low lung volumes limit assessment. IMPRESSION: Hypoventilatory chest with mild bibasilar atelectasis. Similar enlarged cardiac silhouette. Electronically Signed   By: Rubye Oaks M.D.   On: 04/13/2017 01:41    Labs:  CBC: Recent Labs    04/08/17 0943 04/12/17 1954 04/12/17 1959 04/13/17 1104 04/14/17 0634  WBC 10.5 16.0*  --  10.5 9.3   HGB 14.8 15.0 15.3* 13.7 13.4  HCT 44.6 47.8* 45.0 44.2 42.4  PLT 237 264  --  201 196    COAGS: Recent Labs    04/08/17 0943 04/12/17 1954 04/13/17 0448  INR 0.92 1.02  --   APTT 28 26 25     BMP: Recent Labs    04/08/17 0943 04/12/17 1954 04/12/17 1959 04/13/17 1104 04/14/17 0634  NA 141 137 143 141 141  K 3.7 3.6 3.6 3.2* 3.3*  CL 108 106 109 110 107  CO2 23 20*  --  22 24  GLUCOSE 147* 124* 112* 117* 100*  BUN 14 39* 39* 34* 26*  CALCIUM 9.1 8.5*  --  7.9* 8.3*  CREATININE 1.07* 1.33* 1.40* 1.14* 1.01*  GFRNONAA 57* 44*  --  53* >60  GFRAA >60 51*  --  >60 >60    LIVER FUNCTION TESTS: Recent Labs    04/08/17 0943 04/12/17 1954  BILITOT 1.0 0.8  AST 26 22  ALT 22 24  ALKPHOS 75 70  PROT 7.0 6.2*  ALBUMIN 4.2 3.6    TUMOR MARKERS: No results for input(s): AFPTM, CEA, CA199, CHROMGRNA in the last 8760 hours.  Assessment and Plan: Multiple sclerosis vs. CVA Patient admitted with slurred speech and emotional imbalance for work up of MS flare vs CVA.  Patient also with immunocompromise.  Neuro recommends an MRI and LP.  Patient requests this be done under general anesthesia.  IR has arranged for procedure and imaging with anesthesia tomorrow morning.  Will hold lovenox.  NPO after midnight.   Consent obtained by patient who is alert, oriented, and able to express herself and demonstrate understanding even with word finding difficulties.   Thank you for this interesting consult.  I greatly enjoyed meeting Leylani Gargis and look forward to participating in their care.  A copy of this report was sent to the requesting provider on this date.  Electronically Signed: Hoyt Koch, PA 04/14/2017, 3:53 PM   I spent a total of 40 Minutes    in face to face in clinical consultation, greater than 50% of which was counseling/coordinating care for multiple sclerosis.

## 2017-04-14 NOTE — Evaluation (Signed)
Speech Language Pathology Evaluation Patient Details Name: Rachel Gilbert MRN: [id number] DOB: [birth year] Today's Date: 04/14/2017 Time: 4098-1191 SLP Time Calculation (min) (ACUTE ONLY): 15 min  Problem List:  Patient Active Problem List   Diagnosis Date Noted  . CVA (cerebral vascular accident) (HCC) 04/13/2017  . TIA (transient ischemic attack) 04/08/2017  . Attention deficit 11/06/2016  . 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 Insomnia 11/16/2014  . Other fatigue 07/03/2014  . Urinary frequency 07/03/2014  . Gait disturbance 06/14/2014  . Depression with anxiety 06/14/2014  . Vitamin D deficiency 06/14/2014  . Multiple sclerosis exacerbation (HCC) 06/04/2014  . Essential hypertension 06/04/2014  . Obesity 06/04/2014  . Obstructive sleep apnea on CPAP 06/04/2014  . Gastroesophageal reflux disease without esophagitis 06/04/2014  . Multiple sclerosis (HCC) 06/04/2014   Past Medical History:  Past Medical History:  Diagnosis Date  . Complication of anesthesia    hard time waking up   . Hearing loss   . Hypertension   . Kidney stones   . Multiple sclerosis (HCC)   . Sleep apnea    Uses CPAP  . Vision abnormalities    Past Surgical History:  Past Surgical History:  Procedure Laterality Date  . CESAREAN SECTION  1986  . CHOLECYSTECTOMY  2000   laparoscopic   HPI:  Pt is a 57 y.o. female with a history of OSA on CPAP, multiple sclerosis, obesity, hypertension, vision deficits, and hearing loss who presents for evaluation of stroke-like symptoms. Last seen normal was yesterday evening. Next morning when she woke up she reports that she was feeling off and generalized weakness. When she went to brush her teeth, she noted a right-sided facial droop in the mirror. She called a friend who noted that she had slurred, "heavy" speech on the phone. Facial droop has now resolved but patient continues to have difficulty finding words and "heavy" speech. No unilateral weakness or numbness, no  headache. No personal history of strokes. Patient has family history of stroke in her father. She is not a smoker. She reports that she does not take her anti-hypertensive medications as prescribed. She denies chest pain or shortness of breath, nausea or vomiting. She is awake/alert but sleepy appearing at times - using CPAP entering room.    Assessment / Plan / Recommendation Clinical Impression  Pt currently has diagnosis of MS and was admited for stroke work up d/t altered mental status. Pt's most recent CT at [facility] on 3/18 revealed area of increasing low-attenuation in the left frontal lobe white matter concerning for evolving subacute ischemia (axial image 20 of series 3 and coronal image 33 of series 5) in the left MCA distribution. Review of chart reveals that pt experiences cognitive decline during MS excerbations and ST services requested to evaluate pt's current cognitive-linguistic abilities. During this evaluation, pt presents with severe expressive deficits c/b speaking in single words, unable to imitate rote information (DOW, months), inability to state son's name which leads to overall inability to communicate wants/needs. Pt with increased frustrations and emotions d/t frustration. Pt is however more accurate with yes/no questions related to wants/needs and basic biographical information. She is able to follow very simple 1 step directions when related to herself and envirnoment. Pt's expressive deficits result in decreased initiation of all tasks. When pt speaks, her speech exhibits decreased vocal intensity and vocal quality is reduced d/t hearing impairment. Recommend skilled ST to target the above mentioned deficits and increase pt's ability to express her wants and needs.  SLP Assessment  SLP Recommendation/Assessment: Patient needs continued Speech Lanaguage Pathology Services SLP Visit Diagnosis: Aphasia (R47.01)    Follow Up Recommendations  Inpatient Rehab    Frequency and  Duration min 2x/week  2 weeks      SLP Evaluation Cognition  Overall Cognitive Status: Impaired/Different from baseline Arousal/Alertness: Awake/alert Orientation Level: Oriented to person;Oriented to place;Oriented to time;Oriented to situation(via yes/no questions) Attention: Sustained Focused Attention: Appears intact Sustained Attention: Appears intact Memory: (difficult to assess d/t expressive deficits) Awareness: Appears intact Problem Solving: (difficult to assess d/t expressive difficulties) Executive Function: Self Correcting Self Correcting: Impaired Self Correcting Impairment: Verbal basic;Functional basic Behaviors: (very emotional) Safety/Judgment: Appears intact       Comprehension  Auditory Comprehension Overall Auditory Comprehension: Impaired Yes/No Questions: Within Functional Limits(for basic related to person) Commands: Impaired Two Step Basic Commands: 0-24% accurate Conversation: (word level interaction) Interfering Components: Hearing EffectiveTechniques: Repetition;Increased volume;Visual/Gestural cues Visual Recognition/Discrimination Discrimination: Not tested Reading Comprehension Reading Status: Not tested    Expression Expression Primary Mode of Expression: Verbal Verbal Expression Overall Verbal Expression: Impaired Initiation: Impaired Automatic Speech: Name Level of Generative/Spontaneous Verbalization: Word Repetition: Impaired Level of Impairment: Word level Naming: Impairment Pragmatics: Impairment Impairments: Abnormal affect Effective Techniques: Semantic cues;Open ended questions;Sentence completion Non-Verbal Means of Communication: Not applicable Written Expression Dominant Hand: Right Written Expression: Not tested   Oral / Motor  Oral Motor/Sensory Function Overall Oral Motor/Sensory Function: Generalized oral weakness Facial Symmetry: Within Functional Limits Facial Strength: Within Functional Limits Motor  Speech Overall Motor Speech: Impaired Respiration: Impaired Level of Impairment: Word Phonation: Low vocal intensity Resonance: Within functional limits Articulation: Impaired Level of Impairment: Word Intelligibility: Intelligibility reduced Word: 0-24% accurate Phrase: Not tested Sentence: Not tested Conversation: Not tested Motor Planning: Impaired Level of Impairment: Word Motor Speech Errors: Inconsistent Interfering Components: Hearing loss;Premorbid status Effective Techniques: Slow rate;Increased vocal intensity;Over-articulate;Pause   Rachel Gilbert                    Rachel Gilbert 04/14/2017, 10:31 AM

## 2017-04-14 NOTE — Progress Notes (Signed)
Noted plans as outlined by Dr. Leonie Man. I will follow. I met with pt's friend, Baker Janus, who is assisting in planning dispo of pt. Pt has an adult son and Mom in the [place] area. I will keep Baker Janus informed as requested of insurance decisions. [phone]

## 2017-04-14 NOTE — Progress Notes (Signed)
Physical Therapy Treatment Patient Details Name: Rachel Gilbert MRN: [id number] DOB: 08-07-1960 Today's Date: 04/14/2017    History of Present Illness  Rachel Gilbert is a 57 y.o. female with medical history significant of HTN, Rachel Gilbert, Rachel Gilbert, Rachel Gilbert, Rachel Gilbert; who presents with altered mental status, slurred speech, wording finding. Brain MRI-Multiple areas of both focal Rachel confluent restricted diffusion in the left MCA Rachel left frontal lobe representing acute infarct.     PT Comments    Patient progressing well towards PT goals. Continues to have difficulty with expressive aphasia but focused on dual tasking while walking. Pt became tearful when practicing speech- days of week, months of year etc while walking. Continues to demonstrate instability Rachel balance deficits requiring close min guard for safety Rachel stops walking when dual tasking. Great CIR candidate. Will follow.    Follow Up Recommendations  CIR;Supervision - Intermittent     Equipment Recommendations  None recommended by PT    Recommendations for Other Services       Precautions / Restrictions Precautions Precautions: Fall Restrictions Weight Bearing Restrictions: No    Mobility  Bed Mobility Overal bed mobility: Modified Independent             General bed mobility comments: No assist needed.   Transfers Overall transfer level: Needs assistance Equipment used: None Transfers: Sit to/from Stand Sit to Stand: Min guard         General transfer comment: Min guard for safety. Stood from [employer], from toilet x1, from chair x1.   Ambulation/Gait Ambulation/Gait assistance: Min guard Ambulation Distance (Feet): 115 Feet Assistive device: None Gait Pattern/deviations: Step-through pattern;Decreased stride length;Decreased stance time - right;Wide base of support Gait velocity: decreased Gait velocity interpretation: Below normal speed for age/gender General Gait Details: Slow, mildly unsteady gait with bil  lateral sway Rachel wide BoS. Difficulty with dual tasking during gait needing to stop.  Holding onto rail for support. Guarded. 2/4 DOE with wheezing which pt reports as new.   Stairs            Wheelchair Mobility    Modified Rankin (Stroke Patients Only) Modified Rankin (Stroke Patients Only) Pre-Morbid Rankin Score: No symptoms Modified Rankin: Moderately severe disability     Balance Overall balance assessment: Needs assistance Sitting-balance support: Feet supported;No upper extremity supported Sitting balance-Leahy Scale: Good     Standing balance support: During functional activity Standing balance-Leahy Scale: Fair Standing balance comment: Able to brush hair Rachel teeth standing at sink but needs a seated rest break due to fatigue halfway through.                            Cognition Arousal/Alertness: Awake/alert Behavior During Therapy: WFL for tasks assessed/performed(tearful when practicing speech) Overall Cognitive Status: Impaired/Different from baseline Area of Impairment: Following commands                               General Comments: Expressive difficulties. Worked on automatic phrases- stating days of the week Rachel months of the year while walking. Needs repetition of cues to perform tasks at times most likely due to [facility].       Exercises      General Comments        Pertinent Vitals/Pain Pain Assessment: No/denies pain    Home Living     Available Help at Discharge: Family;Friend(s);Available 24 hours/day Type of Home:  House              Prior Function            PT Goals (current goals can now be found in the care plan section) Progress towards PT goals: Progressing toward goals    Frequency    Min 4X/week      PT Plan Current plan remains appropriate    Co-evaluation              AM-PAC PT "6 Clicks" Daily Activity  Outcome Measure  Difficulty turning over in bed (including adjusting  bedclothes, sheets Rachel blankets)?: None Difficulty moving from lying on back to sitting on the side of the bed? : None Difficulty sitting down on Rachel standing up from a chair with arms (e.g., wheelchair, bedside commode, etc,.)?: None Help needed moving to Rachel from a bed to chair (including a wheelchair)?: None Help needed walking in hospital room?: A Little Help needed climbing 3-5 steps with a railing? : A Little 6 Click Score: 22    End of Session Equipment Utilized During Treatment: Gait belt Activity Tolerance: Patient tolerated treatment well Patient left: in bed;with call bell/phone within reach;with bed alarm set Nurse Communication: Mobility status PT Visit Diagnosis: Other abnormalities of gait Rachel mobility (R26.89);Muscle weakness (generalized) (M62.81);Hemiplegia Rachel hemiparesis;Difficulty in walking, not elsewhere classified (R26.2) Hemiplegia - Right/Left: Right Hemiplegia - dominant/non-dominant: Dominant Hemiplegia - caused by: Cerebral infarction     Time: 0935-1001 PT Time Calculation (min) (ACUTE ONLY): 26 min  Charges:  $Gait Training: 8-22 mins $Therapeutic Activity: 8-22 mins                    G Codes:       [name], PT, DPT [phone]     Blake Divine A Belynda Pagaduan 04/14/2017, 11:49 AM

## 2017-04-14 NOTE — Progress Notes (Signed)
STROKE TEAM PROGRESS NOTE   HISTORY OF PRESENT ILLNESS (per record) Rachel Gilbert is an 57 y.o. female with PMH of MS, HTN, sleep apnea recently admitted at [facility] hospital  presents to [facility] emergency room for worsening speech since being discharged from the hospital. She presented on 3/13 with aphasia and slurred speech, right-sided weakness. She was seen by Dr. Reynolds/neurologist who felt the patient had either a stroke or MS flare. An MRI was attempted but did not tolerate it due to claustrophobia. She underwent carotid Doppler and echocardiogram. Received 3 days of solumedrol  and was discharged from outpatient follow-up. CT scan was performed here at  [facility], showing a left frontal hypodensity. Family felt her symptoms were worse and therefore she was admitted to hospitalist team and neurology was consulted.   SUBJECTIVE (INTERVAL HISTORY) No family members present.  The patient continues to have some problems with speech.  Mainly expressive a aphasia.  No new complaints  OBJECTIVE Temp:  [97.3 F (36.3 C)-99 F (37.2 C)] 97.3 F (36.3 C) (03/19 1142) Pulse Rate:  [66-80] 66 (03/19 1142) Cardiac Rhythm: Normal sinus rhythm (03/19 0745) Resp:  [16-20] 16 (03/19 1142) BP: (148-179)/(64-96) 153/72 (03/19 1142) SpO2:  [96 %-98 %] 96 % (03/19 1142)  CBC:  Recent Labs  Lab 04/08/17 0943 04/12/17 1954  04/13/17 1104 04/14/17 0634  WBC 10.5 16.0*  --  10.5 9.3  NEUTROABS 7.5* 11.6*  --   --   --   HGB 14.8 15.0   < > 13.7 13.4  HCT 44.6 47.8*   < > 44.2 42.4  MCV 88.5 92.3  --  93.2 92.4  PLT 237 264  --  201 196   < > = values in this interval not displayed.    Basic Metabolic Panel:  Recent Labs  Lab 04/13/17 1104 04/14/17 0634  NA 141 141  K 3.2* 3.3*  CL 110 107  CO2 22 24  GLUCOSE 117* 100*  BUN 34* 26*  CREATININE 1.14* 1.01*  CALCIUM 7.9* 8.3*    Lipid Panel:     Component Value Date/Time   CHOL 156 04/09/2017 0723   TRIG 142 04/09/2017 0723    HDL 46 04/09/2017 0723   CHOLHDL 3.4 04/09/2017 0723   VLDL 28 04/09/2017 0723   LDLCALC 82 04/09/2017 0723   HgbA1c:  Lab Results  Component Value Date   HGBA1C 5.5 04/09/2017   Urine Drug Screen:     Component Value Date/Time   LABOPIA NONE DETECTED 04/13/2017 0048   COCAINSCRNUR NONE DETECTED 04/13/2017 0048   COCAINSCRNUR NONE DETECTED 04/08/2017 0943   LABBENZ POSITIVE (A) 04/13/2017 0048   AMPHETMU NONE DETECTED 04/13/2017 0048   THCU NONE DETECTED 04/13/2017 0048   LABBARB NONE DETECTED 04/13/2017 0048    Alcohol Level     Component Value Date/Time   ETH <10 04/13/2017 0111    IMAGING   Ct Head Wo Contrast 04/12/2017 IMPRESSION:  1. Area of increasing low-attenuation in the left frontal lobe white matter concerning for evolving subacute ischemia (axial image 20 of series 3 and coronal image 33 of series 5) in the left MCA distribution. This could be confirmed with brain MRI without contrast if clinically appropriate.  2. Chronic microvascular ischemic changes in cerebral white matter.    Dg Chest Portable 1 View 04/13/2017 IMPRESSION:  Hypoventilatory chest with mild bibasilar atelectasis. Similar enlarged cardiac silhouette.    MR Brain W Wo Contrast  04/13/2017 IMPRESSION: The patient refused to continue  the exam, resulting in an incomplete study. Sensitivity and specificity are reduced. Multiple areas of both focal and confluent restricted diffusion within the LEFT MCA territory, LEFT frontal predominant, representing nonhemorrhagic acute infarction, corresponding with the recent CT. Multiple white matter lesions, periventricular predominant, consistent with chronic multiple sclerosis.   Transthoracic Echocardiogram  04/08/2017 Study Conclusions - Procedure narrative: Transthoracic echocardiography. The study   was technically difficult. - Left ventricle: The cavity size was normal. Wall thickness was   increased in a pattern of moderate LVH.  Systolic function was   normal. The estimated ejection fraction was in the range of 55% to 65%. - Aortic valve: Valve area (Vmax): 2.37 cm^2.   Bilateral Carotid Dopplers  04/08/2017 RIGHT CAROTID ARTERY: Minimal intimal thickening in the bulb. Low resistance internal carotid Doppler pattern. RIGHT VERTEBRAL ARTERY:  Antegrade. LEFT CAROTID ARTERY: Little if any plaque in the bulb. Low resistance internal carotid Doppler pattern. LEFT VERTEBRAL ARTERY:  Antegrade. IMPRESSION: Less than 50% stenosis in the right and left internal carotid arteries.    PHYSICAL EXAM Vitals:   04/14/17 0032 04/14/17 0448 04/14/17 0818 04/14/17 1142  BP: (!) 148/84 (!) 148/90 (!) 179/96 (!) 153/72  Pulse: 75 70 80 66  Resp: 20 20 20 16   Temp: 98 F (36.7 C) 98.7 F (37.1 C) (!) 97.4 F (36.3 C) (!) 97.3 F (36.3 C)  TempSrc: Axillary Axillary Oral Axillary  SpO2: 96% 97% 98% 96%  Weight:      Height:       Pleasant middle-aged obese Caucasian lady currently not in distress. . Afebrile. Head is nontraumatic. Neck is supple without bruit.    Cardiac exam no murmur or gallop. Lungs are clear to auscultation. Distal pulses are well felt. Neurological Exam ;  Awake and alert nonfluent speech with child-quality with some word hesitancy. Able to name and repeat well. Good comprehension. No paraphasias.extraocular moments are full range without nystagmus. Blinks to threat bilaterally. Mild right lower facial weakness. Tongue midline. Motor system exam mild right hemiparesis with weakness of right grip and intrinsic hand muscles. Orbits left over right upper extremity. Mild weakness of right hip flexors and ankle dorsiflexors.ensation is intact bilaterally. She is able  to ambulate with slight favoring of the right leg  ASSESSMENT/PLAN [name] is a 57 y.o. female with history of hypertension, hyperlipidemia, claustrophobia, obstructive sleep apnea, and multiple sclerosis presenting with speech  difficulties. She did not receive IV t-PA due to late presentation.  Stroke:  left MCA distribution large subcorticalinfarct -  source unknown. Versus multiple sclerosis lesion ? PML given periventricular location and patient is on long-term Tysabri for her MS  Resultant - expressive aphasia  CT head - Area of increasing low-attenuation in the left frontal lobe white matter.  MRI head - incomplete study - Lt MCA subcorticalacute infarct and findings c/w MS.  MRA head - not performed  CTA Head - pending  Carotid Doppler - Less than 50% stenosis in the right and left internal carotid arteries.  2D Echo - EF 55-60%.  No cardiac source of emboli identified.  Transcranial Dopplers with bubble study - pending  LDL - 82  HgbA1c - 5.5  VTE prophylaxis - Lovenox Fall precautions Aspiration precautions Diet Heart Room service appropriate? Yes; Fluid consistency: Thin  aspirin 81 mg daily prior to admission, now on aspirin 81 mg daily  Patient counseled to be compliant with her antithrombotic medications  Ongoing aggressive stroke risk factor management  Therapy recommendations:  CIR recommended  Disposition:  Pending  Hypertension  Stable  Permissive hypertension (OK if < 220/120) but gradually normalize in 5-7 days  Long-term BP goal normotensive  Hyperlipidemia  Lipid lowering medication PTA: Lipitor 40 mg daily  LDL 82, goal < 70  Increase Lipitor to 80 mg daily  Continue statin at discharge   Other Stroke Risk Factors  ETOH use, advised to drink no more than 1 drink per day.  Obesity, Body mass index is 40.24 kg/m., recommend weight loss, diet and exercise as appropriate   Family hx stroke (Father)  Obstructive sleep apnea, on CPAP at home  Other Active Problems  MS -followed by Dr. Epimenio Foot  Acute kidney injury - BUN -39 ; creatinine -1.40  Claustrophobia  Leukocytosis - monitor - afebrile (recent steroids)  Foul-smelling urine -> UA  pending   Plan / Recommendations   MRI scan of brain n with and without contrast under anesthesia  . Spinal tap under anesthesia to look for [facility] day # 1   I have personally examined this patient, reviewed notes, independently viewed imaging studies, participated in medical decision making and plan of care.ROS completed by me personally and pertinent positives fully documented  I have made any additions or clarifications directly to the above note. Agree with note above.  She has presented with stuttering speech difficulties and right-sided weakness due to left brain subcortical lesion which is diffusion positive this could represent large subcortical left MCA territory infarct though given the fact that she has multiple sclerosis and has been on long-term Tysabri progressive multifocal leukoencephalopathy is also a consideration. Unfortunately patient has not completed the complete MRI to help this differentiation and is refusing to go back and have a postcontrast study.  CT angiogram of the brain does not show significant disease of the left carotid or middle cerebral arteries. Plan is to get MRI scan of the brain under anesthesia with and without contrast and do a spinal tap to look for JC virus.   Case discussed with Dr. Epimenio Foot patient's neurologist and Dr. Benjamine Mola. Greater than 50% time during the study foramina to visit was spent on counseling and coordination of care about her neurological presentation and plan for evaluation and treatment discussion.  Delia Heady, MD Medical Director [facility] St[facility] Pager: [phone] 04/14/2017 1:02 PM  To contact Stroke Continuity provider, please refer to [email or web address]. After hours, contact General Neurology

## 2017-04-14 NOTE — Progress Notes (Signed)
PROGRESS NOTE    [name]  ZOX:[id number] DOB: 1960-12-09 DOA: 04/12/2017 PCP: Josph Macho, FNP   Outpatient Specialists:     Brief Narrative:  Rachel Gilbert is a 57 y.o. female with medical history significant of HTN, MS, OSA on CPAP, and TIA; who presents with altered mental status.   Around 8:10 a.m. 5 days ago(3/13), the patient had called her best friend and was noted to have significantly slurred speech at that time over the phone.  She was also noted to have a mild right-sided facial droop.  She was taken to [facility] regional and evaluated for stroke versus MS flare.  CT scan showed decreased attenuation in the periventricular white matter adjacent to the frontal horns or demyelination versus small vessel disease could not be delineated.  Neurology was consulted and it was decided to give the patient high-dose steroids which she received for 3 days. MRI was recommended but could not be obtained.  Patient seem to be slightly improved, but speech was still somewhat slurred when she was discharged home on 3/16.  the day before re-admission, patient was found to have worsening slurred speech with inability to work on and very emotional which was unusual for her. Needs MRI and LP under sedation.      Assessment & Plan:   Principal Problem:   CVA (cerebral vascular accident) [facility]) Active Problems:   Essential hypertension   Obesity   Obstructive sleep apnea on CPAP   Multiple sclerosis (HCC)   Depression with anxiety   CVA, dysphagia, apraxia:  -Seen at [facility] hospital on 3/13 for the same.  She underwent echocardiogram showing EF of 55-65%, bilateral carotid ultrasound showing less than 50% stenosis repeat CT scan shows area of concern in the left frontal lobe in the distribution of the left MCA for subacute stroke.  Patient unable to have MRI -neurology consult -needs MRI/LP under sedation-- spinal fluid to be sent for JC virus (has MS and is  immunocompromised) -CIR recommended when medically stable -hypercoaguable work up in progress -HIV negative  Acute kidney injury secondary to dehydration: Patient's creatinine previously appears to have been around 1.07 on 04/08/17 but noted to be 1.1 admission with BUN 39.   -prerenal cause of symptoms. -monitor BMP- Cr back to baseline  Leukocytosis: Acute.  Initial WBC 16.  Patient was recently treated with 3 days of high-dose steroids for possible MS. - trending down  Multiple sclerosis: Patient followed by Dr. Marzetta Merino neurology in the outpatient setting and takes natalizumab. - Continue baclofen  Hypokalemia -replete as able  Essential hypertension - Continue Coreg and hydralazine - Hold Spironolactone and lisinopril  Hyperlipidemia: - atorvastatin  OSA on CPAP -  CPAP QHS  Morbid obesity Body mass index is 40.24 kg/m.     DVT prophylaxis:  Lovenox   Code Status: Full Code   Family Communication:   Disposition Plan:  CIR?   Consultants:  neuro  Subjective: Does not think she can have LP not under anesthesia  Objective: Vitals:   04/14/17 0032 04/14/17 0448 04/14/17 0818 04/14/17 1142  BP: (!) 148/84 (!) 148/90 (!) 179/96 (!) 153/72  Pulse: 75 70 80 66  Resp: 20 20 20 16   Temp: 98 F (36.7 C) 98.7 F (37.1 C) (!) 97.4 F (36.3 C) (!) 97.3 F (36.3 C)  TempSrc: Axillary Axillary Oral Axillary  SpO2: 96% 97% 98% 96%  Weight:      Height:        Intake/Output Summary (Last  24 hours) at 04/14/2017 1338 Last data filed at 04/14/2017 0818 Gross per 24 hour  Intake 2338.33 ml  Output 1200 ml  Net 1138.33 ml   Filed Weights   04/12/17 1925 04/12/17 1947 04/13/17 0258  Weight: 122.5 kg (270 lb) 122.5 kg (270 lb) 123.6 kg (272 lb 7.8 oz)    Examination:  General exam: did not speak, but was able to understand and answer questions with head nod Respiratory system: no wheezing, no increased work of breathing Cardiovascular  system: rrr Gastrointestinal system: +Bs, soft Central nervous system: Alert Extremities: moves all 4 ext     Data Reviewed: I have personally reviewed following labs and imaging studies  CBC: Recent Labs  Lab 04/08/17 0943 04/12/17 1954 04/12/17 1959 04/13/17 1104 04/14/17 0634  WBC 10.5 16.0*  --  10.5 9.3  NEUTROABS 7.5* 11.6*  --   --   --   HGB 14.8 15.0 15.3* 13.7 13.4  HCT 44.6 47.8* 45.0 44.2 42.4  MCV 88.5 92.3  --  93.2 92.4  PLT 237 264  --  201 196   Basic Metabolic Panel: Recent Labs  Lab 04/08/17 0943 04/12/17 1954 04/12/17 1959 04/13/17 1104 04/14/17 0634  NA 141 137 143 141 141  K 3.7 3.6 3.6 3.2* 3.3*  CL 108 106 109 110 107  CO2 23 20*  --  22 24  GLUCOSE 147* 124* 112* 117* 100*  BUN 14 39* 39* 34* 26*  CREATININE 1.07* 1.33* 1.40* 1.14* 1.01*  CALCIUM 9.1 8.5*  --  7.9* 8.3*   GFR: Estimated Creatinine Clearance: 88.6 mL/min (A) (by C-G formula based on SCr of 1.01 mg/dL (H)). Liver Function Tests: Recent Labs  Lab 04/08/17 0943 04/12/17 1954  AST 26 22  ALT 22 24  ALKPHOS 75 70  BILITOT 1.0 0.8  PROT 7.0 6.2*  ALBUMIN 4.2 3.6   No results for input(s): LIPASE, AMYLASE in the last 168 hours. No results for input(s): AMMONIA in the last 168 hours. Coagulation Profile: Recent Labs  Lab 04/08/17 0943 04/12/17 1954  INR 0.92 1.02   Cardiac Enzymes: Recent Labs  Lab 04/08/17 0943  TROPONINI <0.03   BNP (last 3 results) No results for input(s): PROBNP in the last 8760 hours. HbA1C: No results for input(s): HGBA1C in the last 72 hours. CBG: No results for input(s): GLUCAP in the last 168 hours. Lipid Profile: No results for input(s): CHOL, HDL, LDLCALC, TRIG, CHOLHDL, LDLDIRECT in the last 72 hours. Thyroid Function Tests: No results for input(s): TSH, T4TOTAL, FREET4, T3FREE, THYROIDAB in the last 72 hours. Anemia Panel: No results for input(s): VITAMINB12, FOLATE, FERRITIN, TIBC, IRON, RETICCTPCT in the last 72  hours. Urine analysis:    Component Value Date/Time   COLORURINE YELLOW 04/13/2017 0048   APPEARANCEUR CLEAR 04/13/2017 0048   LABSPEC 1.015 04/13/2017 0048   PHURINE 5.0 04/13/2017 0048   GLUCOSEU 50 (A) 04/13/2017 0048   HGBUR NEGATIVE 04/13/2017 0048   BILIRUBINUR NEGATIVE 04/13/2017 0048   KETONESUR NEGATIVE 04/13/2017 0048   PROTEINUR NEGATIVE 04/13/2017 0048   NITRITE NEGATIVE 04/13/2017 0048   LEUKOCYTESUR NEGATIVE 04/13/2017 0048      Recent Results (from the past 240 hour(s))  Culture, blood (Routine X 2) w Reflex to ID Panel     Status: None (Preliminary result)   Collection Time: 04/13/17  5:58 AM  Result Value Ref Range Status   Specimen Description BLOOD LEFT HAND  Final   Special Requests   Final    BOTTLES  DRAWN AEROBIC AND ANAEROBIC Blood Culture adequate volume   Culture   Final    NO GROWTH 1 DAY Performed at [facility], [address], [place], [place] [address]    Report Status PENDING  Incomplete  Culture, blood (Routine X 2) w Reflex to ID Panel     Status: None (Preliminary result)   Collection Time: 04/13/17  6:03 AM  Result Value Ref Range Status   Specimen Description BLOOD RIGHT HAND  Final   Special Requests   Final    BOTTLES DRAWN AEROBIC AND ANAEROBIC Blood Culture results may not be optimal due to an inadequate volume of blood received in culture bottles   Culture   Final    NO GROWTH 1 DAY Performed at [facility], [address], [place], [place] [address]    Report Status PENDING  Incomplete      Anti-infectives (From admission, onward)   None       Radiology Studies: Ct Angio Head W Or Wo Contrast  Result Date: 04/13/2017 CLINICAL DATA:  Stroke follow-up EXAM: CT ANGIOGRAPHY HEAD TECHNIQUE: Multidetector CT imaging of the head was performed using the standard protocol during bolus administration of intravenous contrast. Multiplanar CT image reconstructions and MIPs were obtained to evaluate the vascular anatomy.  CONTRAST:  50 mL Isovue 370 COMPARISON:  Head CT 04/12/2017 FINDINGS: Anterior circulation: --Intracranial internal carotid arteries: Normal. --Anterior cerebral arteries: Normal. Both A1 segments are present. --Middle cerebral arteries: Normal. --Posterior communicating arteries: Present on the left, absent on the right. Posterior circulation: --Basilar artery: Normal. --Posterior cerebral arteries: Moderate stenosis of the right P1 and proximal P2 segments. Fetal origin of the left PCA. --Superior cerebellar arteries: Normal. --Inferior cerebellar arteries: Normal anterior and posterior inferior cerebellar arteries. Venous sinuses: As permitted by contrast timing, patent. Anatomic variants: None Delayed phase: There are areas of faint hypoattenuation in the left frontal white matter corresponding to the ischemia demonstrated on the prior MRI. Review of the MIP images confirms the above findings. IMPRESSION: 1. No emergent large vessel occlusion. 2. Moderate stenosis of the right posterior cerebral artery P1 and proximal P2 segments. 3. Hypoattenuation in the left frontal white matter consistent with known recent infarct. Electronically Signed   By: Deatra Robinson M.D.   On: 04/13/2017 18:06   Ct Head Wo Contrast  Result Date: 04/12/2017 CLINICAL DATA:  57 year old female with slurred speech and elevated blood pressure. EXAM: CT HEAD WITHOUT CONTRAST TECHNIQUE: Contiguous axial images were obtained from the base of the skull through the vertex without intravenous contrast. COMPARISON:  Head CT 04/08/2017. FINDINGS: Brain: Patchy and confluent areas of decreased attenuation are noted throughout the deep and periventricular white matter of the cerebral hemispheres bilaterally, compatible with chronic microvascular ischemic disease. In addition, in the left frontal lobe white matter there is a more well-defined area of low attenuation (axial image 20 of series 3 and coronal image 33 of series 5) which is new  compared to the prior examination from 04/08/2017, concerning for an area of subacute ischemia. No evidence of acute hemorrhage, hydrocephalus, extra-axial collection or mass lesion/mass effect. Vascular: No hyperdense vessel or unexpected calcification. Skull: Normal. Negative for fracture or focal lesion. Sinuses/Orbits: No acute finding. Other: None. IMPRESSION: 1. Area of increasing low-attenuation in the left frontal lobe white matter concerning for evolving subacute ischemia (axial image 20 of series 3 and coronal image 33 of series 5) in the left MCA distribution. This could be confirmed with brain MRI  without contrast if clinically appropriate. 2. Chronic microvascular ischemic changes in cerebral white matter. These results were called by telephone at the time of interpretation on 04/12/2017 at 9:11 pm to Dr. Raeford Razor, who verbally acknowledged these results. Electronically Signed   By: Trudie Reed M.D.   On: 04/12/2017 21:14   Mr Brain Wo Contrast  Result Date: 04/13/2017 CLINICAL DATA:  New onset of slurred speech, without definite weakness. History of hypertension. History of multiple sclerosis. EXAM: MRI HEAD WITHOUT CONTRAST TECHNIQUE: Multiplanar, multiecho pulse sequences of the brain and surrounding structures were obtained without intravenous contrast. COMPARISON:  CT head 04/08/2017. CT head 04/12/2017. MRI 07/02/2016 and 11/29/2014. FINDINGS: The patient prematurely truncated the exam after diffusion, sagittal T1, and axial T2 images were obtained. Sensitivity and specificity are reduced. Brain: Multiple areas of both focal and confluent restricted diffusion affect the LEFT frontal cortex and subcortical white matter, with slight involvement of the superior insula, possibly the superior lentiform nucleus, consistent with acute infarction. These correspond to LEFT MCA territory ischemia. No hemorrhage, mass lesion, hydrocephalus, or extra-axial fluid. Mild premature for age cerebral  volume loss. Numerous periventricular white matter lesions are observed, likely greater than 10, with corresponding volume loss of the corpus callosum. None definitely demonstrate restriction. Medial LEFT thalamic T2 hyperintense lesion could be ischemic or related to chronic demyelination. Similarly, LEFT external capsule lesions could be ischemic or demyelinating in origin. These are incompletely evaluated given the premature termination of the study. Vascular: Normal flow voids. Skull and upper cervical spine: Normal marrow signal Sinuses/Orbits: Chronic sinus disease. Symmetric and negative orbits except for proptosis. Other: None. Compared with prior MRs, similar appearance to the suspected demyelinating lesions. IMPRESSION: The patient refused to continue the exam, resulting in an incomplete study. Sensitivity and specificity are reduced. Multiple areas of both focal and confluent restricted diffusion within the LEFT MCA territory, LEFT frontal predominant, representing nonhemorrhagic acute infarction, corresponding with the recent CT. Multiple white matter lesions, periventricular predominant, consistent with chronic multiple sclerosis. Electronically Signed   By: Elsie Stain M.D.   On: 04/13/2017 10:21   Dg Chest Portable 1 View  Result Date: 04/13/2017 CLINICAL DATA:  Cough.  Possible aspiration. EXAM: PORTABLE CHEST 1 VIEW COMPARISON:  Frontal and lateral views 06/04/2014 FINDINGS: Unchanged prominent cardiac silhouette. Minimal bibasilar atelectasis. No pulmonary edema, confluent airspace disease, large pleural effusion or pneumothorax. Body habitus and low lung volumes limit assessment. IMPRESSION: Hypoventilatory chest with mild bibasilar atelectasis. Similar enlarged cardiac silhouette. Electronically Signed   By: Rubye Oaks M.D.   On: 04/13/2017 01:41        Scheduled Meds: . aspirin EC  81 mg Oral Daily  . atorvastatin  80 mg Oral q1800  . carvedilol  25 mg Oral BID WC  .  enoxaparin (LOVENOX) injection  40 mg Subcutaneous Daily  . famotidine  40 mg Oral QHS  . hydrALAZINE  50 mg Oral BID  . oxybutynin  15 mg Oral QHS   Continuous Infusions: . sodium chloride 50 mL/hr at 04/14/17 1115     LOS: 1 day    Time spent: 35 min    Joseph Art, DO [facility] Pager [phone]  If 7PM-7AM, please contact night-coverage [email or web address] Password [email or web address] 04/14/2017, 1:38 PM

## 2017-04-15 ENCOUNTER — Inpatient Hospital Stay (HOSPITAL_COMMUNITY): Payer: Medicare HMO

## 2017-04-15 ENCOUNTER — Inpatient Hospital Stay (HOSPITAL_COMMUNITY): Payer: Medicare HMO | Admitting: Certified Registered"

## 2017-04-15 ENCOUNTER — Encounter (HOSPITAL_COMMUNITY): Payer: Self-pay | Admitting: Interventional Radiology

## 2017-04-15 ENCOUNTER — Ambulatory Visit: Payer: Self-pay | Admitting: Neurology

## 2017-04-15 ENCOUNTER — Encounter (HOSPITAL_COMMUNITY): Payer: Medicare HMO

## 2017-04-15 ENCOUNTER — Encounter (HOSPITAL_COMMUNITY)
Admission: EM | Disposition: A | Discharge status: Home Health | Payer: Self-pay | Source: Home / Self Care | Attending: Internal Medicine

## 2017-04-15 HISTORY — PX: RADIOLOGY WITH ANESTHESIA: SHX6223

## 2017-04-15 HISTORY — PX: IR FLUORO GUIDED NEEDLE PLC ASPIRATION/INJECTION LOC: IMG2395

## 2017-04-15 LAB — LUPUS ANTICOAGULANT PANEL
DRVVT: 44.5 s (ref 0.0–47.0)
PTT Lupus Anticoagulant: 33.8 s (ref 0.0–51.9)

## 2017-04-15 LAB — PROTEIN C ACTIVITY: Protein C Activity: 123 % (ref 73–180)

## 2017-04-15 LAB — PROTEIN S, TOTAL: PROTEIN S AG TOTAL: 88 % (ref 60–150)

## 2017-04-15 LAB — PROTEIN S ACTIVITY: PROTEIN S ACTIVITY: 75 % (ref 63–140)

## 2017-04-15 SURGERY — MRI WITH ANESTHESIA
Anesthesia: General

## 2017-04-15 MED ORDER — GLYCOPYRROLATE 0.2 MG/ML IV SOSY
PREFILLED_SYRINGE | INTRAVENOUS | Status: DC | PRN
  Administered 2017-04-15: .1 mg via INTRAVENOUS

## 2017-04-15 MED ORDER — PROPOFOL 10 MG/ML IV BOLUS
INTRAVENOUS | Status: DC | PRN
  Administered 2017-04-15: 200 mg via INTRAVENOUS

## 2017-04-15 MED ORDER — FENTANYL CITRATE (PF) 100 MCG/2ML IJ SOLN
INTRAMUSCULAR | Status: DC | PRN
  Administered 2017-04-15: 100 ug via INTRAVENOUS

## 2017-04-15 MED ORDER — MEPERIDINE HCL 50 MG/ML IJ SOLN: 6.2500 mg | INTRAMUSCULAR | Status: DC | PRN

## 2017-04-15 MED ORDER — GADOBENATE DIMEGLUMINE 529 MG/ML IV SOLN: 20.0000 mL | Freq: Once | INTRAVENOUS | Status: DC | PRN

## 2017-04-15 MED ORDER — LISINOPRIL 20 MG PO TABS
40.0000 mg | ORAL_TABLET | Freq: Every day | ORAL | Status: DC
  Administered 2017-04-15 – 2017-04-21 (×7): 40 mg via ORAL
  Filled 2017-04-15 (×7): qty 2

## 2017-04-15 MED ORDER — PROPOFOL 500 MG/50ML IV EMUL
INTRAVENOUS | Status: DC | PRN
  Administered 2017-04-15: 100 ug/kg/min via INTRAVENOUS

## 2017-04-15 MED ORDER — HYDROMORPHONE HCL 1 MG/ML IJ SOLN
0.2500 mg | INTRAMUSCULAR | Status: DC | PRN
  Administered 2017-04-15 (×2): 0.5 mg via INTRAVENOUS

## 2017-04-15 MED ORDER — HYDROMORPHONE HCL 1 MG/ML IJ SOLN
INTRAMUSCULAR | Status: AC
  Filled 2017-04-15: qty 1

## 2017-04-15 MED ORDER — LACTATED RINGERS IV SOLN
INTRAVENOUS | Status: DC | PRN
  Administered 2017-04-15 (×2): via INTRAVENOUS

## 2017-04-15 MED ORDER — MIDAZOLAM HCL 5 MG/5ML IJ SOLN
INTRAMUSCULAR | Status: DC | PRN
  Administered 2017-04-15 (×2): 1 mg via INTRAVENOUS

## 2017-04-15 MED ORDER — SUGAMMADEX SODIUM 200 MG/2ML IV SOLN
INTRAVENOUS | Status: DC | PRN
  Administered 2017-04-15: 400 mg via INTRAVENOUS

## 2017-04-15 MED ORDER — EPHEDRINE SULFATE-NACL 50-0.9 MG/10ML-% IV SOSY
PREFILLED_SYRINGE | INTRAVENOUS | Status: DC | PRN
  Administered 2017-04-15 (×2): 5 mg via INTRAVENOUS

## 2017-04-15 MED ORDER — ROCURONIUM BROMIDE 100 MG/10ML IV SOLN
INTRAVENOUS | Status: DC | PRN
  Administered 2017-04-15: 50 mg via INTRAVENOUS
  Administered 2017-04-15: 20 mg via INTRAVENOUS

## 2017-04-15 MED ORDER — LIDOCAINE 2% (20 MG/ML) 5 ML SYRINGE
INTRAMUSCULAR | Status: DC | PRN
  Administered 2017-04-15: 100 mg via INTRAVENOUS

## 2017-04-15 MED ORDER — ONDANSETRON HCL 4 MG/2ML IJ SOLN: 4.0000 mg | Freq: Once | INTRAMUSCULAR | Status: DC | PRN

## 2017-04-15 MED ORDER — PHENYLEPHRINE 40 MCG/ML (10ML) SYRINGE FOR IV PUSH (FOR BLOOD PRESSURE SUPPORT)
PREFILLED_SYRINGE | INTRAVENOUS | Status: DC | PRN
  Administered 2017-04-15 (×2): 80 ug via INTRAVENOUS
  Administered 2017-04-15: 120 ug via INTRAVENOUS
  Administered 2017-04-15 (×3): 80 ug via INTRAVENOUS

## 2017-04-15 NOTE — Progress Notes (Signed)
Pt transported to MRI 

## 2017-04-15 NOTE — Progress Notes (Addendum)
I received a call from pt's friend, [name]. She has discussed the continued needs she would have after d/c home. Pt's Mom, in [place] states she can no longer offer assistance to her at d/c. Pt's son also states he is unable to be her caregiver. They are requesting SNF rehab in the [place] area. I have updated SW. We sill sign off at this time. Also [employer] has just informed me of denial for admission to CIR, they will approve [phone]

## 2017-04-15 NOTE — Anesthesia Procedure Notes (Signed)
Procedure Name: Intubation Date/Time: 04/15/2017 9:24 AM Performed by: Julian Reil, CRNA Pre-anesthesia Checklist: Patient identified, Emergency Drugs available, Patient being monitored, Suction available and Timeout performed Patient Re-evaluated:Patient Re-evaluated prior to induction Oxygen Delivery Method: Circle system utilized Preoxygenation: Pre-oxygenation with 100% oxygen Induction Type: IV induction Ventilation: Mask ventilation without difficulty and Two handed mask ventilation required Laryngoscope Size: Miller and 3 Grade View: Grade I Tube type: Oral Tube size: 7.0 mm Number of attempts: 1 Airway Equipment and Method: Stylet Placement Confirmation: ETT inserted through vocal cords under direct vision,  positive ETCO2 and breath sounds checked- equal and bilateral Secured at: 23 cm Tube secured with: Tape Dental Injury: Teeth and Oropharynx as per pre-operative assessment  Comments: 4x4s bite block used.

## 2017-04-15 NOTE — Transfer of Care (Signed)
Immediate Anesthesia Transfer of Care Note  Patient: Rachel Gilbert  Procedure(s) Performed: LUMBER PUNCTURE MRI SCAN (N/A )  Patient Location: PACU  Anesthesia Type:General  Level of Consciousness: awake, oriented and patient cooperative  Airway & Oxygen Therapy: Patient Spontanous Breathing and Patient connected to face mask oxygen  Post-op Assessment: Report given to RN, Post -op Vital signs reviewed and stable and Patient moving all extremities X 4  Post vital signs: Reviewed and stable  Last Vitals:  Vitals:   04/15/17 0036 04/15/17 0400  BP: 140/85 (!) 142/87  Pulse: 65 71  Resp: 18 18  Temp: 36.7 C 36.6 C  SpO2: 99% 99%    Last Pain:  Vitals:   04/15/17 0400  TempSrc: Oral  PainSc:          Complications: No apparent anesthesia complications

## 2017-04-15 NOTE — Progress Notes (Signed)
Pt remained NPO for MRI under anesthesia. Report given to Aisha in the OR at [phone]. Pt awaiting to be picked up

## 2017-04-15 NOTE — Anesthesia Preprocedure Evaluation (Signed)
Anesthesia Evaluation  Patient identified by MRN, date of birth, ID band Patient awake    Reviewed: Allergy & Precautions, NPO status , Patient's Chart, lab work & pertinent test results  Airway Mallampati: I  TM Distance: >3 FB Neck ROM: Full    Dental   Pulmonary sleep apnea ,    Pulmonary exam normal        Cardiovascular hypertension, Pt. on medications Normal cardiovascular exam     Neuro/Psych Anxiety H/O Multiple Sclerosis CVA    GI/Hepatic GERD  Medicated and Controlled,  Endo/Other    Renal/GU      Musculoskeletal   Abdominal   Peds  Hematology   Anesthesia Other Findings   Reproductive/Obstetrics                             Anesthesia Physical Anesthesia Plan  ASA: III  Anesthesia Plan: General   Post-op Pain Management:    Induction: Intravenous  PONV Risk Score and Plan: 3 and Ondansetron and Midazolam  Airway Management Planned: Oral ETT  Additional Equipment:   Intra-op Plan:   Post-operative Plan: Extubation in OR  Informed Consent: I have reviewed the patients History and Physical, chart, labs and discussed the procedure including the risks, benefits and alternatives for the proposed anesthesia with the patient or authorized representative who has indicated his/her understanding and acceptance.     Plan Discussed with: CRNA and Surgeon  Anesthesia Plan Comments:         Anesthesia Quick Evaluation

## 2017-04-15 NOTE — Procedures (Signed)
  Procedure: LP under fluoro 24g 5ml EBL:   minimal Complications:  none immediate  See full dictation in [employer].  Thora Lance MD Main # [phone] Pager  [phone]

## 2017-04-15 NOTE — Progress Notes (Signed)
PT Cancellation Note  Patient Details Name: Rachel Gilbert MRN: [id number] DOB: 1960/11/26   Cancelled Treatment:    Reason Eval/Treat Not Completed: Patient at procedure or test/unavailable. Pt undergoing lumbar puncture. Will check back when pt is medically ready.   Kallie Locks, PTA Pager [phone] Acute Rehab   Sheral Apley 04/15/2017, 10:26 AM

## 2017-04-15 NOTE — Progress Notes (Signed)
OT Cancellation Note  Patient Details Name: Rachel Gilbert MRN: [id number] DOB: [birth year]   Cancelled Treatment:    Reason Eval/Treat Not Completed: Patient at procedure or test/ unavailable  Anastasiya Gowin 04/15/2017, 8:15 AM  Marica Otter, OTR/L [phone] 04/15/2017

## 2017-04-15 NOTE — Progress Notes (Signed)
    [facility] has been requested to perform a transesophageal echocardiogram on [name]  for stroke.  After careful review of history and examination, the risks and benefits of transesophageal echocardiogram have been explained including risks of esophageal damage, perforation (1:10,000 risk), bleeding, pharyngeal hematoma as well as other potential complications associated with conscious sedation including aspiration, arrhythmia, respiratory failure and death. Alternatives to treatment were discussed, questions were answered. Patient is willing to proceed.  TEE - Dr. Rennis Golden @ 9am . NPO after midnight. Meds with sips.   Manson Passey, PA-C 04/15/2017 3:19 PM

## 2017-04-15 NOTE — Progress Notes (Signed)
PROGRESS NOTE    Rachel Gilbert  LMB:[id number] DOB: [birth year] DOA: 04/12/2017 PCP: Rachel Macho, FNP   Outpatient Specialists:     Brief Narrative:  Rachel Gilbert is a 57 y.o. female with medical history significant of HTN, MS, OSA on CPAP, and TIA; who presents with altered mental status.   Around 8:10 a.m. 5 days ago(3/13), the patient had called her best friend and was noted to have significantly slurred speech at that time over the phone.  She was also noted to have a mild right-sided facial droop.  She was taken to [facility] regional and evaluated for stroke versus MS flare.  CT scan showed decreased attenuation in the periventricular white matter adjacent to the frontal horns or demyelination versus small vessel disease could not be delineated.  Neurology was consulted and it was decided to give the patient high-dose steroids which she received for 3 days. MRI was recommended but could not be obtained.  Patient seem to be slightly improved, but speech was still somewhat slurred when she was discharged home on 3/16.  the day before re-admission, patient was found to have worsening slurred speech with inability to work on and very emotional which was unusual for her. s/p MRI and LP under sedation with MRI showing CVA.      Assessment & Plan:   Principal Problem:   CVA (cerebral vascular accident) [facility]) Active Problems:   Essential hypertension   Obesity   Obstructive sleep apnea on CPAP   Multiple sclerosis (HCC)   Depression with anxiety   CVA, dysphagia, apraxia:  -Seen at [facility] hospital on 3/13 for the same.  She underwent echocardiogram showing EF of 55-65%, bilateral carotid ultrasound showing less than 50% stenosis repeat CT scan shows area of concern in the left frontal lobe in the distribution of the left MCA for subacute stroke. -neurology consult appreciated -s/p MRI/LP under sedation-- spinal fluid to be sent for JC virus (has MS and is  immunocompromised) -hypercoaguable work up in progress- negative thus far -SNF eventually -HIV negative -cardiology consulted for TEE/loop  Acute kidney injury secondary to dehydration: Patient's creatinine previously appears to have been around 1.07 on 04/08/17 but noted to be 1.1 admission with BUN 39.   -prerenal cause of symptoms. -monitor BMP- Cr back to baseline  Leukocytosis: Acute.  Initial WBC 16.  Patient was recently treated with 3 days of high-dose steroids for possible MS. - trending down  Multiple sclerosis: Patient followed by Dr. Marzetta Merino neurology in the outpatient setting and takes natalizumab. - Continue baclofen  Hypokalemia -replete as able  Essential hypertension - Continue Coreg and hydralazine - resume ACE-- monitor K and Cr closely  Hyperlipidemia: - atorvastatin  OSA on CPAP -  CPAP QHS  Morbid obesity Body mass index is 40.24 kg/m.     DVT prophylaxis:  Lovenox   Code Status: Full Code   Family Communication:   Disposition Plan:  SNF   Consultants:  neuro  Subjective: S/p LP and MRI under anesthesia   Objective: Vitals:   04/15/17 1200 04/15/17 1205 04/15/17 1215 04/15/17 1234  BP: (!) 162/100 (!) 174/94 (!) 174/92 (!) 178/105  Pulse: 78 74 75 81  Resp: (!) 21 15 15 17   Temp:   (!) 97.3 F (36.3 C) (!) 97.5 F (36.4 C)  TempSrc:    Oral  SpO2: 95% 95% 97% 99%  Weight:      Height:        Intake/Output Summary (Last 24 hours)  at 04/15/2017 1436 Last data filed at 04/15/2017 1136 Gross per 24 hour  Intake 1340 ml  Output 250 ml  Net 1090 ml   Filed Weights   04/12/17 1925 04/12/17 1947 04/13/17 0258  Weight: 122.5 kg (270 lb) 122.5 kg (270 lb) 123.6 kg (272 lb 7.8 oz)    Examination:  General exam: very sleepy post anesthesia seen in PCU Respiratory system: no increased work of breathing Cardiovascular system: rrr Gastrointestinal system: +Bs, soft Central nervous system:  sleepy Extremities: no focal deficit     Data Reviewed: I have personally reviewed following labs and imaging studies  CBC: Recent Labs  Lab 04/12/17 1954 04/12/17 1959 04/13/17 1104 04/14/17 0634  WBC 16.0*  --  10.5 9.3  NEUTROABS 11.6*  --   --   --   HGB 15.0 15.3* 13.7 13.4  HCT 47.8* 45.0 44.2 42.4  MCV 92.3  --  93.2 92.4  PLT 264  --  201 196   Basic Metabolic Panel: Recent Labs  Lab 04/12/17 1954 04/12/17 1959 04/13/17 1104 04/14/17 0634  NA 137 143 141 141  K 3.6 3.6 3.2* 3.3*  CL 106 109 110 107  CO2 20*  --  22 24  GLUCOSE 124* 112* 117* 100*  BUN 39* 39* 34* 26*  CREATININE 1.33* 1.40* 1.14* 1.01*  CALCIUM 8.5*  --  7.9* 8.3*   GFR: Estimated Creatinine Clearance: 88.6 mL/min (A) (by C-G formula based on SCr of 1.01 mg/dL (H)). Liver Function Tests: Recent Labs  Lab 04/12/17 1954  AST 22  ALT 24  ALKPHOS 70  BILITOT 0.8  PROT 6.2*  ALBUMIN 3.6   No results for input(s): LIPASE, AMYLASE in the last 168 hours. No results for input(s): AMMONIA in the last 168 hours. Coagulation Profile: Recent Labs  Lab 04/12/17 1954  INR 1.02   Cardiac Enzymes: No results for input(s): CKTOTAL, CKMB, CKMBINDEX, TROPONINI in the last 168 hours. BNP (last 3 results) No results for input(s): PROBNP in the last 8760 hours. HbA1C: No results for input(s): HGBA1C in the last 72 hours. CBG: No results for input(s): GLUCAP in the last 168 hours. Lipid Profile: No results for input(s): CHOL, HDL, LDLCALC, TRIG, CHOLHDL, LDLDIRECT in the last 72 hours. Thyroid Function Tests: No results for input(s): TSH, T4TOTAL, FREET4, T3FREE, THYROIDAB in the last 72 hours. Anemia Panel: No results for input(s): VITAMINB12, FOLATE, FERRITIN, TIBC, IRON, RETICCTPCT in the last 72 hours. Urine analysis:    Component Value Date/Time   COLORURINE YELLOW 04/13/2017 0048   APPEARANCEUR CLEAR 04/13/2017 0048   LABSPEC 1.015 04/13/2017 0048   PHURINE 5.0 04/13/2017 0048    GLUCOSEU 50 (A) 04/13/2017 0048   HGBUR NEGATIVE 04/13/2017 0048   BILIRUBINUR NEGATIVE 04/13/2017 0048   KETONESUR NEGATIVE 04/13/2017 0048   PROTEINUR NEGATIVE 04/13/2017 0048   NITRITE NEGATIVE 04/13/2017 0048   LEUKOCYTESUR NEGATIVE 04/13/2017 0048      Recent Results (from the past 240 hour(s))  Culture, blood (Routine X 2) w Reflex to ID Panel     Status: None (Preliminary result)   Collection Time: 04/13/17  5:58 AM  Result Value Ref Range Status   Specimen Description BLOOD LEFT HAND  Final   Special Requests   Final    BOTTLES DRAWN AEROBIC AND ANAEROBIC Blood Culture adequate volume   Culture   Final    NO GROWTH 1 DAY Performed at [facility], [address] [place], [place] [address]    Report Status  PENDING  Incomplete  Culture, blood (Routine X 2) w Reflex to ID Panel     Status: None (Preliminary result)   Collection Time: 04/13/17  6:03 AM  Result Value Ref Range Status   Specimen Description BLOOD RIGHT HAND  Final   Special Requests   Final    BOTTLES DRAWN AEROBIC AND ANAEROBIC Blood Culture results may not be optimal due to an inadequate volume of blood received in culture bottles   Culture   Final    NO GROWTH 1 DAY Performed at [facility], [address], [place], [place] [address]    Report Status PENDING  Incomplete      Anti-infectives (From admission, onward)   None       Radiology Studies: Ct Angio Head W Or Wo Contrast  Addendum Date: 04/14/2017   ADDENDUM REPORT: 04/14/2017 13:57 ADDENDUM: On further review of this CTA in the subsequently performed MRI, the following finding is noted. There is severe stenosis of 1 of the left opercular (M3 segment) MCA branches, best demonstrated on series 13, image 27 and series 12, image 21. This location is compatible with the area of infarction demonstrated on the MRI performed later the same day. Electronically Signed   By: Deatra Robinson M.D.   On: 04/14/2017 13:57   Result Date:  04/14/2017 CLINICAL DATA:  Stroke follow-up EXAM: CT ANGIOGRAPHY HEAD TECHNIQUE: Multidetector CT imaging of the head was performed using the standard protocol during bolus administration of intravenous contrast. Multiplanar CT image reconstructions and MIPs were obtained to evaluate the vascular anatomy. CONTRAST:  50 mL Isovue 370 COMPARISON:  Head CT 04/12/2017 FINDINGS: Anterior circulation: --Intracranial internal carotid arteries: Normal. --Anterior cerebral arteries: Normal. Both A1 segments are present. --Middle cerebral arteries: Normal. --Posterior communicating arteries: Present on the left, absent on the right. Posterior circulation: --Basilar artery: Normal. --Posterior cerebral arteries: Moderate stenosis of the right P1 and proximal P2 segments. Fetal origin of the left PCA. --Superior cerebellar arteries: Normal. --Inferior cerebellar arteries: Normal anterior and posterior inferior cerebellar arteries. Venous sinuses: As permitted by contrast timing, patent. Anatomic variants: None Delayed phase: There are areas of faint hypoattenuation in the left frontal white matter corresponding to the ischemia demonstrated on the prior MRI. Review of the MIP images confirms the above findings. IMPRESSION: 1. No emergent large vessel occlusion. 2. Moderate stenosis of the right posterior cerebral artery P1 and proximal P2 segments. 3. Hypoattenuation in the left frontal white matter consistent with known recent infarct. Electronically Signed: By: Deatra Robinson M.D. On: 04/13/2017 18:06   Mr Laqueta Jean UE Contrast  Result Date: 04/15/2017 CLINICAL DATA:  Stroke follow-up.  History of multiple sclerosis. EXAM: MRI HEAD WITHOUT AND WITH CONTRAST TECHNIQUE: Multiplanar, multiecho pulse sequences of the brain and surrounding structures were obtained without and with intravenous contrast. CONTRAST:  20 mL MultiHance COMPARISON:  Incomplete brain MRI 04/13/2017.  07/02/2016 brain MRI. FINDINGS: The examination was  performed under general anesthesia. Brain: Patchy to confluent restricted diffusion involving white matter greater than cortex in the left frontal lobe in the MCA territory is unchanged from the recent prior MRI. There is associated T2 hyperintensity reflecting cytotoxic edema which has slightly increased without mass effect. There is a subcentimeter focus of enhancement associated with an area of cortical infarction. Scattered bilateral subcortical and deep cerebral white matter T2 hyperintensities elsewhere have not significantly changed from the 2018 study. Multiple foci of encephalomalacia/chronic lacunar infarcts are again seen involving the corpus callosum, left thalamus,  and left greater than right basal ganglia/deep white matter. Mild prominence of the pituitary gland, not measuring 9 mm in height, is unchanged from 2018 and without a focal lesion evident on this nondedicated study. Numerous chronic microhemorrhages are again seen in both cerebral and cerebellar hemispheres, including in the basal ganglia and thalami as well as in the brainstem. There is no mass, midline shift, or extra-axial fluid collection. Vascular: Major intracranial vascular flow voids are preserved. Skull and upper cervical spine: Unremarkable bone marrow signal. Sinuses/Orbits: Chronic symmetric enlargement of the lacrimal glands. Scattered mild paranasal sinus mucosal thickening. Clear mastoid air cells. Other: None. IMPRESSION: 1. Patchy early subacute left MCA infarcts without extension from 04/13/2017. 2. No new infarct. 3. Similar appearance of T2 hyperintensities throughout the cerebral white matter and deep gray nuclei compared to 2018. These may reflect a combination of chronic multiple sclerosis and chronic small vessel ischemia. 4. Numerous chronic microhemorrhages throughout the brain suggesting chronic hypertension. Electronically Signed   By: Sebastian Ache M.D.   On: 04/15/2017 12:11   Ir Fluoro Guide Ndl Plmt /  Bx  Result Date: 04/15/2017 CLINICAL DATA:  Multiple sclerosis EXAM: LUMBAR PUNCTURE UNDER FLUOROSCOPY FLUOROSCOPY TIME:  0.1 minutes; 14 uGym2 DAP TECHNIQUE: The procedure, risks (including but not limited to bleeding, infection, organ damage ), benefits, and alternatives were explained to the patient. Questions regarding the procedure were encouraged and answered. The patient understands and consents to the procedure. Patient was placed under general anesthetic by department of anesthesia. Patient was placed prone. An appropriate skin entry site was determined fluoroscopically. Operator donned sterile gloves and mask. Skin site was marked, then prepped with Betadine, draped in usual sterile fashion, and infiltrated locally with 1% lidocaine. A 24 gauge spinal needle advanced into the thecal sac at L3-4 from a right interlaminar approach. Clear colorless CSF spontaneously returned. 5ml CSF were collected for the requested laboratory studies. The needle was then removed. COMPLICATIONS: None immediate IMPRESSION: 1. Technically successful lumbar puncture under fluoroscopy. Electronically Signed   By: Corlis Leak M.D.   On: 04/15/2017 10:24        Scheduled Meds: . aspirin EC  81 mg Oral Daily  . atorvastatin  80 mg Oral q1800  . carvedilol  25 mg Oral BID WC  . famotidine  40 mg Oral QHS  . hydrALAZINE  50 mg Oral BID  . HYDROmorphone      . oxybutynin  15 mg Oral QHS   Continuous Infusions:    LOS: 2 days    Time spent: 35 min    Joseph Art, DO [facility] Pager [phone]  If 7PM-7AM, please contact night-coverage [email or web address] Password [facility] 04/15/2017, 2:36 PM

## 2017-04-15 NOTE — Progress Notes (Signed)
  Speech Language Pathology Treatment: Cognitive-Linquistic  Patient Details Name: Rachel Gilbert MRN: [id number] DOB: 1960/03/22 Today's Date: 04/15/2017 Time: [id number] SLP Time Calculation (min) (ACUTE ONLY): 20 min  Assessment / Plan / Recommendation Clinical Impression  Pt with improved expressive output today.  She was able to recite DOW, months of year, and answer questions requiring a single word response independently.  However, pt unable to assemble even basic sentences to express a thought, nor could she complete generative naming tasks despite max cues.  She read a paragraph aloud that was fluent and without paraphasic errors or mispronunciations. Her verbal output appears to ebb and flow during the course of a day.  SLP will continue to follow for aphasia.     HPI HPI: Pt is a 57 y.o. female with a history of OSA on CPAP, multiple sclerosis, obesity, hypertension, vision deficits, and hearing loss who presents for evaluation of stroke-like symptoms. Last seen normal was yesterday evening. Next morning when she woke up she reports that she was feeling off and generalized weakness. When she went to brush her teeth, she noted a right-sided facial droop in the mirror. She called a friend who noted that she had slurred, "heavy" speech on the phone. Facial droop has now resolved but patient continues to have difficulty finding words and "heavy" speech. No unilateral weakness or numbness, no headache. No personal history of strokes. Patient has family history of stroke in her father. She is not a smoker. She reports that she does not take her anti-hypertensive medications as prescribed. She denies chest pain or shortness of breath, nausea or vomiting. She is awake/alert but sleepy appearing at times - using CPAP entering room.       SLP Plan  Continue with current plan of care       Recommendations                   Oral Care Recommendations: Oral care BID Follow up Recommendations:  Inpatient Rehab SLP Visit Diagnosis: Aphasia (R47.01) Plan: Continue with current plan of care       GO                Blenda Mounts Laurice 04/15/2017, 4:46 PM

## 2017-04-15 NOTE — Progress Notes (Signed)
STROKE TEAM PROGRESS NOTE   HISTORY OF PRESENT ILLNESS (per record) Rachel Gilbert is an 57 y.o. female with PMH of MS, HTN, sleep apnea recently admitted at [facility]  presents to [facility] emergency room for worsening speech since being discharged from the hospital. She presented on 3/13 with aphasia and slurred speech, right-sided weakness. She was seen by Rachel Gilbert/neurologist who felt the patient had either a stroke or MS flare. An MRI was attempted but did not tolerate it due to claustrophobia. She underwent carotid Doppler and echocardiogram. Received 3 days of solumedrol  and was discharged from outpatient follow-up. CT scan was performed here at  [facility], showing a left frontal hypodensity. Family felt her symptoms were worse and therefore she was admitted to hospitalist team and neurology was consulted.   SUBJECTIVE (INTERVAL HISTORY) No family members present.  The patient has gone for mri under anesthesias No new changes noted per chart OBJECTIVE Temp:  [97.3 F (36.3 C)-98 F (36.7 C)] 97.5 F (36.4 C) (03/20 1234) Pulse Rate:  [65-81] 81 (03/20 1234) Cardiac Rhythm: Normal sinus rhythm (03/20 1215) Resp:  [15-29] 17 (03/20 1234) BP: (140-178)/(84-105) 178/105 (03/20 1234) SpO2:  [92 %-99 %] 99 % (03/20 1234)  CBC:  Recent Labs  Lab 04/12/17 1954  04/13/17 1104 04/14/17 0634  WBC 16.0*  --  10.5 9.3  NEUTROABS 11.6*  --   --   --   HGB 15.0   < > 13.7 13.4  HCT 47.8*   < > 44.2 42.4  MCV 92.3  --  93.2 92.4  PLT 264  --  201 196   < > = values in this interval not displayed.    Basic Metabolic Panel:  Recent Labs  Lab 04/13/17 1104 04/14/17 0634  NA 141 141  K 3.2* 3.3*  CL 110 107  CO2 22 24  GLUCOSE 117* 100*  BUN 34* 26*  CREATININE 1.14* 1.01*  CALCIUM 7.9* 8.3*    Lipid Panel:     Component Value Date/Time   CHOL 156 04/09/2017 0723   TRIG 142 04/09/2017 0723   HDL 46 04/09/2017 0723   CHOLHDL 3.4 04/09/2017 0723   VLDL 28  04/09/2017 0723   LDLCALC 82 04/09/2017 0723   HgbA1c:  Lab Results  Component Value Date   HGBA1C 5.5 04/09/2017   Urine Drug Screen:     Component Value Date/Time   LABOPIA NONE DETECTED 04/13/2017 0048   COCAINSCRNUR NONE DETECTED 04/13/2017 0048   COCAINSCRNUR NONE DETECTED 04/08/2017 0943   LABBENZ POSITIVE (A) 04/13/2017 0048   AMPHETMU NONE DETECTED 04/13/2017 0048   THCU NONE DETECTED 04/13/2017 0048   LABBARB NONE DETECTED 04/13/2017 0048    Alcohol Level     Component Value Date/Time   ETH <10 04/13/2017 0111    IMAGING   Ct Head Wo Contrast 04/12/2017 IMPRESSION:  1. Area of increasing low-attenuation in the left frontal lobe white matter concerning for evolving subacute ischemia (axial image 20 of series 3 and coronal image 33 of series 5) in the left MCA distribution. This could be confirmed with brain MRI without contrast if clinically appropriate.  2. Chronic microvascular ischemic changes in cerebral white matter.    Dg Chest Portable 1 View 04/13/2017 IMPRESSION:  Hypoventilatory chest with mild bibasilar atelectasis. Similar enlarged cardiac silhouette.    MR Brain W Wo Contrast  04/13/2017 IMPRESSION: The patient refused to continue the exam, resulting in an incomplete study. Sensitivity and specificity are reduced. Multiple  areas of both focal and confluent restricted diffusion within the LEFT MCA territory, LEFT frontal predominant, representing nonhemorrhagic acute infarction, corresponding with the recent CT. Multiple white matter lesions, periventricular predominant, consistent with chronic multiple sclerosis.   Transthoracic Echocardiogram  04/08/2017 Study Conclusions - Procedure narrative: Transthoracic echocardiography. The study   was technically difficult. - Left ventricle: The cavity size was normal. Wall thickness was   increased in a pattern of moderate LVH. Systolic function was   normal. The estimated ejection fraction was in the  range of 55% to 65%. - Aortic valve: Valve area (Vmax): 2.37 cm^2.   Bilateral Carotid Dopplers  04/08/2017 RIGHT CAROTID ARTERY: Minimal intimal thickening in the bulb. Low resistance internal carotid Doppler pattern. RIGHT VERTEBRAL ARTERY:  Antegrade. LEFT CAROTID ARTERY: Little if any plaque in the bulb. Low resistance internal carotid Doppler pattern. LEFT VERTEBRAL ARTERY:  Antegrade. IMPRESSION: Less than 50% stenosis in the right and left internal carotid arteries.  Hypercoagulable panel negative lupus anticoagulant, antithrombin III, protein CNS activity, beta 2 glycoprotein's. Homocystine marginally elevated at 16.  PHYSICAL EXAM Vitals:   04/15/17 1200 04/15/17 1205 04/15/17 1215 04/15/17 1234  BP: (!) 162/100 (!) 174/94 (!) 174/92 (!) 178/105  Pulse: 78 74 75 81  Resp: (!) 21 15 15 17   Temp:   (!) 97.3 F (36.3 C) (!) 97.5 F (36.4 C)  TempSrc:    Oral  SpO2: 95% 95% 97% 99%  Weight:      Height:       Pleasant middle-aged obese Caucasian lady currently not in distress. . Afebrile. Head is nontraumatic. Neck is supple without bruit.    Cardiac exam no murmur or gallop. Lungs are clear to auscultation. Distal pulses are well felt. Neurological Exam ;  Awake and alert nonfluent speech with child-quality with some word hesitancy. Able to name and repeat well. Good comprehension. No paraphasias.extraocular moments are full range without nystagmus. Blinks to threat bilaterally. Mild right lower facial weakness. Tongue midline. Motor system exam mild right hemiparesis with weakness of right grip and intrinsic hand muscles. Orbits left over right upper extremity. Mild weakness of right hip flexors and ankle dorsiflexors.ensation is intact bilaterally. She is able  to ambulate with slight favoring of the right leg  ASSESSMENT/PLAN [name] is a 57 y.o. female with history of hypertension, hyperlipidemia, claustrophobia, obstructive sleep apnea, and multiple sclerosis  presenting with speech difficulties. She did not receive IV t-PA due to late presentation.  Stroke:  left MCA distribution large subcorticalinfarct -  source unknown. Versus multiple sclerosis lesion ? PML given periventricular location and patient is on long-term Tysabri for her MS  Resultant - expressive aphasia  CT head - Area of increasing low-attenuation in the left frontal lobe white matter.  MRI head - incomplete study - Lt MCA subcorticalacute infarct and findings c/w MS.  MRA head - not performed  CTA Head - pending  Carotid Doppler - Less than 50% stenosis in the right and left internal carotid arteries.  2D Echo - EF 55-60%.  No cardiac source of emboli identified.  Transcranial Dopplers with bubble study - pending  LDL - 82  HgbA1c - 5.5  VTE prophylaxis - Lovenox Fall precautions Aspiration precautions Diet Heart Room service appropriate? Yes; Fluid consistency: Thin  aspirin 81 mg daily prior to admission, now on aspirin 81 mg daily  Patient counseled to be compliant with her antithrombotic medications  Ongoing aggressive stroke risk factor management  Therapy recommendations:  CIR recommended  Disposition:  Pending  Hypertension  Stable  Permissive hypertension (OK if < 220/120) but gradually normalize in 5-7 days  Long-term BP goal normotensive  Hyperlipidemia  Lipid lowering medication PTA: Lipitor 40 mg daily  LDL 82, goal < 70  Increase Lipitor to 80 mg daily  Continue statin at discharge   Other Stroke Risk Factors  ETOH use, advised to drink no more than 1 drink per day.  Obesity, Body mass index is 40.24 kg/m., recommend weight loss, diet and exercise as appropriate   Family hx stroke (Father)  Obstructive sleep apnea, on CPAP at home  Other Active Problems  MS -followed by Dr. Epimenio Foot  Acute kidney injury - BUN -39 ; creatinine -1.40  Claustrophobia  Leukocytosis - monitor - afebrile (recent steroids)  Foul-smelling  urine -> UA pending   Plan / Recommendations   TEE and loop recorder to look for cardiac source of embolism, cryptogenic stroke as hypercarbia panel so far is negative and MRI with contrast is compatible with recent stroke  Follow spinal fluid JC virus antibodies  D/w Dr [facility] day # 2  Greater than 50% time during this 25 minute visit was spent on counseling and coordination of care about her strokes and multiple sclerosis and answering questions    Delia Heady, MD Medical Director [facility] Pager: [phone] 04/15/2017 1:23 PM  To contact Stroke Continuity provider, please refer to [email or web address]. After hours, contact General Neurology

## 2017-04-16 ENCOUNTER — Encounter (HOSPITAL_COMMUNITY)
Admission: EM | Disposition: A | Discharge status: Home Health | Payer: Self-pay | Source: Home / Self Care | Attending: Internal Medicine

## 2017-04-16 ENCOUNTER — Inpatient Hospital Stay (HOSPITAL_COMMUNITY): Payer: Medicare HMO

## 2017-04-16 ENCOUNTER — Encounter (HOSPITAL_COMMUNITY): Payer: Self-pay | Admitting: *Deleted

## 2017-04-16 DIAGNOSIS — I6300 Cerebral infarction due to thrombosis of unspecified precerebral artery: Secondary | ICD-10-CM

## 2017-04-16 DIAGNOSIS — R1310 Dysphagia, unspecified: Secondary | ICD-10-CM

## 2017-04-16 DIAGNOSIS — I6389 Other cerebral infarction: Secondary | ICD-10-CM

## 2017-04-16 HISTORY — PX: TEE WITHOUT CARDIOVERSION: SHX5443

## 2017-04-16 LAB — CBC
HEMATOCRIT: 41 % (ref 36.0–46.0)
HEMOGLOBIN: 12.9 g/dL (ref 12.0–15.0)
MCH: 28.7 pg (ref 26.0–34.0)
MCHC: 31.5 g/dL (ref 30.0–36.0)
MCV: 91.1 fL (ref 78.0–100.0)
Platelets: 200 10*3/uL (ref 150–400)
RBC: 4.5 MIL/uL (ref 3.87–5.11)
RDW: 14.9 % (ref 11.5–15.5)
WBC: 12.1 10*3/uL — AB (ref 4.0–10.5)

## 2017-04-16 LAB — BASIC METABOLIC PANEL
ANION GAP: 9 (ref 5–15)
BUN: 19 mg/dL (ref 6–20)
CO2: 22 mmol/L (ref 22–32)
Calcium: 8.3 mg/dL — ABNORMAL LOW (ref 8.9–10.3)
Chloride: 108 mmol/L (ref 101–111)
Creatinine, Ser: 0.93 mg/dL (ref 0.44–1.00)
GFR calc non Af Amer: >60 mL/min (ref >60)
Glucose, Bld: 104 mg/dL — ABNORMAL HIGH (ref 65–99)
Potassium: 3.2 mmol/L — ABNORMAL LOW (ref 3.5–5.1)
Sodium: 139 mmol/L (ref 135–145)

## 2017-04-16 SURGERY — LOOP RECORDER INSERTION

## 2017-04-16 SURGERY — ECHOCARDIOGRAM, TRANSESOPHAGEAL
Anesthesia: Moderate Sedation

## 2017-04-16 MED ORDER — MIDAZOLAM HCL 5 MG/ML IJ SOLN
INTRAMUSCULAR | Status: AC
  Filled 2017-04-16: qty 2

## 2017-04-16 MED ORDER — LIDOCAINE VISCOUS 2 % MT SOLN
OROMUCOSAL | Status: AC
  Filled 2017-04-16: qty 15

## 2017-04-16 MED ORDER — SODIUM CHLORIDE 0.9 % IV SOLN
INTRAVENOUS | Status: DC
  Administered 2017-04-16: 09:00:00 via INTRAVENOUS

## 2017-04-16 MED ORDER — FENTANYL CITRATE (PF) 100 MCG/2ML IJ SOLN
INTRAMUSCULAR | Status: DC | PRN
  Administered 2017-04-16 (×2): 25 ug via INTRAVENOUS

## 2017-04-16 MED ORDER — CLOPIDOGREL BISULFATE 75 MG PO TABS
75.0000 mg | ORAL_TABLET | Freq: Every day | ORAL | Status: DC
  Administered 2017-04-16 – 2017-04-21 (×6): 75 mg via ORAL
  Filled 2017-04-16 (×6): qty 1

## 2017-04-16 MED ORDER — FENTANYL CITRATE (PF) 100 MCG/2ML IJ SOLN
INTRAMUSCULAR | Status: AC
  Filled 2017-04-16: qty 2

## 2017-04-16 MED ORDER — MIDAZOLAM HCL 10 MG/2ML IJ SOLN
INTRAMUSCULAR | Status: DC | PRN
  Administered 2017-04-16 (×2): 2 mg via INTRAVENOUS
  Administered 2017-04-16: 1 mg via INTRAVENOUS

## 2017-04-16 MED ORDER — BUTAMBEN-TETRACAINE-BENZOCAINE 2-2-14 % EX AERO
INHALATION_SPRAY | CUTANEOUS | Status: DC | PRN
  Administered 2017-04-16: 2 via TOPICAL

## 2017-04-16 MED ORDER — POTASSIUM CHLORIDE CRYS ER 20 MEQ PO TBCR
40.0000 meq | EXTENDED_RELEASE_TABLET | Freq: Once | ORAL | Status: AC
  Administered 2017-04-16: 40 meq via ORAL
  Filled 2017-04-16: qty 2

## 2017-04-16 NOTE — Progress Notes (Signed)
Occupational Therapy Treatment Patient Details Name: Rachel Gilbert MRN: [id number] DOB: [birth year] Today's Date: 04/16/2017    History of present illness  Rachel Gilbert is a 57 y.o. female with medical history significant of HTN, MS, HOH, OSA on CPAP, and TIA; who presents with altered mental status, slurred speech, wording finding. Brain MRI-Multiple areas of both focal and confluent restricted diffusion in the left MCA and left frontal lobe representing acute infarct.    OT comments  Pt making steady progress although participation limited today by fatigue most likely related to procedure earlier to day. Continue to recommend post acute rehab prior to return home. Will continue to follow acutely.   Follow Up Recommendations  SNF;Supervision/Assistance - 24 hour    Equipment Recommendations  None recommended by OT    Recommendations for Other Services      Precautions / Restrictions Precautions Precautions: Fall       Mobility Bed Mobility Overal bed mobility: Modified Independent                Transfers Overall transfer level: Needs assistance   Transfers: Sit to/from Stand;Stand Pivot Transfers Sit to Stand: Min guard Stand pivot transfers: Min guard            Balance     Sitting balance-Leahy Scale: Good       Standing balance-Leahy Scale: Fair                             ADL either performed or assessed with clinical judgement   ADL Overall ADL's : Needs assistance/impaired     Grooming: Min guard;Standing Grooming Details (indicate cue type and reason): poor completion of task     Lower Body Bathing: Minimal assistance;Sit to/from stand   Upper Body Dressing : Set up;Supervision/safety;Sitting   Lower Body Dressing: Minimal assistance;Sit to/from stand   Toilet Transfer: Minimal assistance;Grab bars   Toileting- Architect and Hygiene: Min guard;Sit to/from stand       Functional mobility during ADLs: Minimal  assistance       Vision       Perception     Praxis      Cognition Arousal/Alertness: Awake/alert Behavior During Therapy: Flat affect Overall Cognitive Status: Impaired/Different from baseline Area of Impairment: Attention;Safety/judgement;Awareness;Problem solving                       Following Commands: Follows multi-step commands consistently Safety/Judgement: Decreased awareness of deficits Awareness: Emergent Problem Solving: Slow processing          Exercises     Shoulder Instructions       General Comments      Pertinent Vitals/ Pain       Pain Assessment: No/denies pain  Home Living                                          Prior Functioning/Environment              Frequency  Min 3X/week        Progress Toward Goals  OT Goals(current goals can now be found in the care plan section)  Progress towards OT goals: Progressing toward goals  Acute Rehab OT Goals Patient Stated Goal: to be able to talk and return to PLOF OT Goal Formulation: With patient Time For Goal Achievement: 04/27/17  Potential to Achieve Goals: Good ADL Goals Pt Will Perform Grooming: with supervision;standing Pt Will Perform Lower Body Bathing: with set-up;with supervision;with adaptive equipment;sit to/from stand Pt Will Perform Lower Body Dressing: with set-up;with supervision;sit to/from stand;with adaptive equipment Pt Will Transfer to Toilet: with supervision;ambulating;regular height toilet;grab bars Pt Will Perform Toileting - Clothing Manipulation and hygiene: with supervision;sit to/from stand Pt/caregiver will Perform Home Exercise Program: Increased strength;Increased ROM;Right Upper extremity;With written HEP provided  Plan Discharge plan needs to be updated    Co-evaluation                 AM-PAC PT "6 Clicks" Daily Activity     Outcome Measure   Help from another person eating meals?: None Help from another person  taking care of personal grooming?: A Little Help from another person toileting, which includes using toliet, bedpan, or urinal?: A Little Help from another person bathing (including washing, rinsing, drying)?: A Little Help from another person to put on and taking off regular upper body clothing?: A Little Help from another person to put on and taking off regular lower body clothing?: A Little 6 Click Score: 19    End of Session Equipment Utilized During Treatment: Gait belt  OT Visit Diagnosis: Unsteadiness on feet (R26.81);Cognitive communication deficit (R41.841) Symptoms and signs involving cognitive functions: Cerebral infarction   Activity Tolerance Patient tolerated treatment well   Patient Left in bed;with call bell/phone within reach;with bed alarm set   Nurse Communication Mobility status        Time: 1610-9604 OT Time Calculation (min): 12 min  Charges: OT General Charges $OT Visit: 1 Visit OT Treatments $Self Care/Home Management : 8-22 mins  [facility], OT/L  [phone] 04/16/2017   Laketta Soderberg,HILLARY 04/16/2017, 3:57 PM

## 2017-04-16 NOTE — Progress Notes (Signed)
EP service called for evaluation of loop implant.  Please call day of discharge for evaluation.  Francis Dowse, PA-C

## 2017-04-16 NOTE — Consult Note (Signed)
ELECTROPHYSIOLOGY CONSULT NOTE  Patient ID: Rachel Gilbert MRN: [id number], DOB/AGE: November 30, 1960   Admit date: 04/12/2017 Date of Consult: 04/16/2017  Primary Physician: Josph Macho, FNP    Reason for Consultation: Cryptogenic stroke; recommendations regarding Implantable Loop Recorder  History of Present Illness Maricella Filyaw was admitted on 04/12/2017 with acute CVA.  Dr Pearlean Brownie is consulting EP for further evaluation of arrhythmia as a possible cause for her stroke.   she has been monitored on telemetry which has demonstrated no arrhythmias. No cause has been identified. She has significant stroke deficits remaining including expressive aphasia.  She will likely require SNF at dischage. EP has been asked to evaluate for placement of an implantable loop recorder to monitor for atrial fibrillation.  Past Medical History Past Medical History:  Diagnosis Date  . Complication of anesthesia    hard time waking up   . Hearing loss   . Hypertension   . Kidney stones   . Multiple sclerosis (HCC)   . Sleep apnea    Uses CPAP  . Vision abnormalities     Past Surgical History Past Surgical History:  Procedure Laterality Date  . CESAREAN SECTION  1986  . CHOLECYSTECTOMY  2000   laparoscopic  . IR FLUORO GUIDED NEEDLE PLC ASPIRATION/INJECTION LOC  04/15/2017  . RADIOLOGY WITH ANESTHESIA N/A 04/15/2017   Procedure: LUMBER PUNCTURE MRI SCAN;  Surgeon: Radiologist, Medication, MD;  Location: [facility] OR;  Service: Radiology;  Laterality: N/A;  . TEE WITHOUT CARDIOVERSION N/A 04/16/2017   Procedure: TRANSESOPHAGEAL ECHOCARDIOGRAM (TEE) WITH LOOP;  Surgeon: Wendall Stade, MD;  Location: [facility] ENDOSCOPY;  Service: Cardiovascular;  Laterality: N/A;    Allergies/Intolerances Allergies  Allergen Reactions  . Oxycodone Anaphylaxis   Inpatient Medications . aspirin EC  81 mg Oral Daily  . atorvastatin  80 mg Oral q1800  . carvedilol  25 mg Oral BID WC  . clopidogrel  75 mg Oral Daily  . famotidine   40 mg Oral QHS  . hydrALAZINE  50 mg Oral BID  . lisinopril  40 mg Oral Daily  . oxybutynin  15 mg Oral QHS    Social History Social History   Socioeconomic History  . Marital status: Single    Spouse name: Not on file  . Number of children: Not on file  . Years of education: Not on file  . Highest education level: Not on file  Occupational History  . Occupation: disabled  Social Needs  . Financial resource strain: Not on file  . Food insecurity:    Worry: Not on file    Inability: Not on file  . Transportation needs:    Medical: Not on file    Non-medical: Not on file  Tobacco Use  . Smoking status: Never Smoker  . Smokeless tobacco: Never Used  Substance and Sexual Activity  . Alcohol use: Yes    Comment: Occasional  . Drug use: No  . Sexual activity: Not on file  Lifestyle  . Physical activity:    Days per week: Not on file    Minutes per session: Not on file  . Stress: Not on file  Relationships  . Social connections:    Talks on phone: Not on file    Gets together: Not on file    Attends religious service: Not on file    Active member of club or organization: Not on file    Attends meetings of clubs or organizations: Not on file    Relationship status: Not  on file  . Intimate partner violence:    Fear of current or ex partner: Not on file    Emotionally abused: Not on file    Physically abused: Not on file    Forced sexual activity: Not on file  Other Topics Concern  . Not on file  Social History Narrative  . Not on file    Review of Systems She has profound expressive aphasia and is not able to provide  Physical Exam Blood pressure (!) 164/93, pulse 71, temperature 98 F (36.7 C), temperature source Oral, resp. rate 18, height 5\' 9"  (1.753 m), weight 272 lb (123.4 kg), last menstrual period 01/15/2015, SpO2 98 %.  General: overweight,  57 y.o. female in no acute distress. Alerg, expressive aphasia HEENT: Normocephalic, atraumatic. EOMs intact.  Sclera nonicteric. Oropharynx clear.  Neck: Supple without bruits. No JVD. Lungs: Respirations regular and unlabored, CTA bilaterally. No wheezes, rales or rhonchi. Heart: RRR. S1, S2 present. No murmurs, rub, S3 or S4. Abdomen: Soft, non-tender, non-distended. BS present x 4 quadrants. No hepatosplenomegaly.  Extremities: No clubbing, cyanosis or edema. DP/PT/Radials 2+ and equal bilaterally. Psych: Normal affect. Neuro: Alert with expressive aphasia Musculoskeletal: No kyphosis. Skin: Intact. Warm and dry. No rashes or petechiae in exposed areas.   Labs Lab Results  Component Value Date   WBC 12.1 (H) 04/16/2017   HGB 12.9 04/16/2017   HCT 41.0 04/16/2017   MCV 91.1 04/16/2017   PLT 200 04/16/2017    Recent Labs  Lab 04/12/17 1954  04/16/17 0646  NA 137   < > 139  K 3.6   < > 3.2*  CL 106   < > 108  CO2 20*   < > 22  BUN 39*   < > 19  CREATININE 1.33*   < > 0.93  CALCIUM 8.5*   < > 8.3*  PROT 6.2*  --   --   BILITOT 0.8  --   --   ALKPHOS 70  --   --   ALT 24  --   --   AST 22  --   --   GLUCOSE 124*   < > 104*   < > = values in this interval not displayed.   No results for input(s): INR in the last 72 hours.  Radiology/Studies Ct Angio Head W Or Wo Contrast  Addendum Date: 04/14/2017   ADDENDUM REPORT: 04/14/2017 13:57 ADDENDUM: On further review of this CTA in the subsequently performed MRI, the following finding is noted. There is severe stenosis of 1 of the left opercular (M3 segment) MCA branches, best demonstrated on series 13, image 27 and series 12, image 21. This location is compatible with the area of infarction demonstrated on the MRI performed later the same day. Electronically Signed   By: Deatra Robinson M.D.   On: 04/14/2017 13:57   Result Date: 04/14/2017 CLINICAL DATA:  Stroke follow-up EXAM: CT ANGIOGRAPHY HEAD TECHNIQUE: Multidetector CT imaging of the head was performed using the standard protocol during bolus administration of intravenous contrast.  Multiplanar CT image reconstructions and MIPs were obtained to evaluate the vascular anatomy. CONTRAST:  50 mL Isovue 370 COMPARISON:  Head CT 04/12/2017 FINDINGS: Anterior circulation: --Intracranial internal carotid arteries: Normal. --Anterior cerebral arteries: Normal. Both A1 segments are present. --Middle cerebral arteries: Normal. --Posterior communicating arteries: Present on the left, absent on the right. Posterior circulation: --Basilar artery: Normal. --Posterior cerebral arteries: Moderate stenosis of the right P1 and proximal P2 segments. Fetal origin of  the left PCA. --Superior cerebellar arteries: Normal. --Inferior cerebellar arteries: Normal anterior and posterior inferior cerebellar arteries. Venous sinuses: As permitted by contrast timing, patent. Anatomic variants: None Delayed phase: There are areas of faint hypoattenuation in the left frontal white matter corresponding to the ischemia demonstrated on the prior MRI. Review of the MIP images confirms the above findings. IMPRESSION: 1. No emergent large vessel occlusion. 2. Moderate stenosis of the right posterior cerebral artery P1 and proximal P2 segments. 3. Hypoattenuation in the left frontal white matter consistent with known recent infarct. Electronically Signed: By: Deatra Robinson M.D. On: 04/13/2017 18:06   Ct Head Wo Contrast  Result Date: 04/12/2017 CLINICAL DATA:  57 year old female with slurred speech and elevated blood pressure. EXAM: CT HEAD WITHOUT CONTRAST TECHNIQUE: Contiguous axial images were obtained from the base of the skull through the vertex without intravenous contrast. COMPARISON:  Head CT 04/08/2017. FINDINGS: Brain: Patchy and confluent areas of decreased attenuation are noted throughout the deep and periventricular white matter of the cerebral hemispheres bilaterally, compatible with chronic microvascular ischemic disease. In addition, in the left frontal lobe white matter there is a more well-defined area of low  attenuation (axial image 20 of series 3 and coronal image 33 of series 5) which is new compared to the prior examination from 04/08/2017, concerning for an area of subacute ischemia. No evidence of acute hemorrhage, hydrocephalus, extra-axial collection or mass lesion/mass effect. Vascular: No hyperdense vessel or unexpected calcification. Skull: Normal. Negative for fracture or focal lesion. Sinuses/Orbits: No acute finding. Other: None. IMPRESSION: 1. Area of increasing low-attenuation in the left frontal lobe white matter concerning for evolving subacute ischemia (axial image 20 of series 3 and coronal image 33 of series 5) in the left MCA distribution. This could be confirmed with brain MRI without contrast if clinically appropriate. 2. Chronic microvascular ischemic changes in cerebral white matter. These results were called by telephone at the time of interpretation on 04/12/2017 at 9:11 pm to Dr. Raeford Razor, who verbally acknowledged these results. Electronically Signed   By: Trudie Reed M.D.   On: 04/12/2017 21:14   Ct Head Wo Contrast  Result Date: 04/08/2017 CLINICAL DATA:  Altered mental status.  Headache.  Hypertension. EXAM: CT HEAD WITHOUT CONTRAST TECHNIQUE: Contiguous axial images were obtained from the base of the skull through the vertex without intravenous contrast. COMPARISON:  July 02, 2016 brain MRI FINDINGS: Brain: The ventricles are normal in size and configuration. There is no intracranial mass, hemorrhage, extra-axial fluid collection, or midline shift. Decreased attenuation is noted in the centra semiovale bilaterally. This appearance is essentially stable compared to prior MR. No new gray-white compartment lesion is demonstrated on this study. No acute infarct is appreciable. Vascular: No hyperdense vessel. There is calcification in each carotid siphon. Skull: Bony calvarium appears intact. Sinuses/Orbits: There is mucosal thickening in several ethmoid air cells. Other paranasal  sinuses which are visualized are clear. Orbits appear symmetric bilaterally. Other: Mastoid air cells are clear. A small protein containing cyst is noted in the soft tissues immediately adjacent to the superior frontal bone measuring 1 x 1 cm. IMPRESSION: Decreased attenuation in the periventricular white matter adjacent to the frontal horns is again noted. Question small vessel disease versus demyelination. Both entities may exist concurrently. Appearance is essentially stable compared to prior MR. No acute infarct evident. No mass or hemorrhage. There are foci of arteriovascular calcification. There is mucosal thickening in several ethmoid air cells. Electronically Signed   By: Bretta Bang  III M.D.   On: 04/08/2017 09:43   Mr Brain Wo Contrast  Result Date: 04/13/2017 CLINICAL DATA:  New onset of slurred speech, without definite weakness. History of hypertension. History of multiple sclerosis. EXAM: MRI HEAD WITHOUT CONTRAST TECHNIQUE: Multiplanar, multiecho pulse sequences of the brain and surrounding structures were obtained without intravenous contrast. COMPARISON:  CT head 04/08/2017. CT head 04/12/2017. MRI 07/02/2016 and 11/29/2014. FINDINGS: The patient prematurely truncated the exam after diffusion, sagittal T1, and axial T2 images were obtained. Sensitivity and specificity are reduced. Brain: Multiple areas of both focal and confluent restricted diffusion affect the LEFT frontal cortex and subcortical white matter, with slight involvement of the superior insula, possibly the superior lentiform nucleus, consistent with acute infarction. These correspond to LEFT MCA territory ischemia. No hemorrhage, mass lesion, hydrocephalus, or extra-axial fluid. Mild premature for age cerebral volume loss. Numerous periventricular white matter lesions are observed, likely greater than 10, with corresponding volume loss of the corpus callosum. None definitely demonstrate restriction. Medial LEFT thalamic T2  hyperintense lesion could be ischemic or related to chronic demyelination. Similarly, LEFT external capsule lesions could be ischemic or demyelinating in origin. These are incompletely evaluated given the premature termination of the study. Vascular: Normal flow voids. Skull and upper cervical spine: Normal marrow signal Sinuses/Orbits: Chronic sinus disease. Symmetric and negative orbits except for proptosis. Other: None. Compared with prior MRs, similar appearance to the suspected demyelinating lesions. IMPRESSION: The patient refused to continue the exam, resulting in an incomplete study. Sensitivity and specificity are reduced. Multiple areas of both focal and confluent restricted diffusion within the LEFT MCA territory, LEFT frontal predominant, representing nonhemorrhagic acute infarction, corresponding with the recent CT. Multiple white matter lesions, periventricular predominant, consistent with chronic multiple sclerosis. Electronically Signed   By: Elsie Stain M.D.   On: 04/13/2017 10:21   Mr Laqueta Jean JG Contrast  Result Date: 04/15/2017 CLINICAL DATA:  Stroke follow-up.  History of multiple sclerosis. EXAM: MRI HEAD WITHOUT AND WITH CONTRAST TECHNIQUE: Multiplanar, multiecho pulse sequences of the brain and surrounding structures were obtained without and with intravenous contrast. CONTRAST:  20 mL MultiHance COMPARISON:  Incomplete brain MRI 04/13/2017.  07/02/2016 brain MRI. FINDINGS: The examination was performed under general anesthesia. Brain: Patchy to confluent restricted diffusion involving white matter greater than cortex in the left frontal lobe in the MCA territory is unchanged from the recent prior MRI. There is associated T2 hyperintensity reflecting cytotoxic edema which has slightly increased without mass effect. There is a subcentimeter focus of enhancement associated with an area of cortical infarction. Scattered bilateral subcortical and deep cerebral white matter T2  hyperintensities elsewhere have not significantly changed from the 2018 study. Multiple foci of encephalomalacia/chronic lacunar infarcts are again seen involving the corpus callosum, left thalamus, and left greater than right basal ganglia/deep white matter. Mild prominence of the pituitary gland, not measuring 9 mm in height, is unchanged from 2018 and without a focal lesion evident on this nondedicated study. Numerous chronic microhemorrhages are again seen in both cerebral and cerebellar hemispheres, including in the basal ganglia and thalami as well as in the brainstem. There is no mass, midline shift, or extra-axial fluid collection. Vascular: Major intracranial vascular flow voids are preserved. Skull and upper cervical spine: Unremarkable bone marrow signal. Sinuses/Orbits: Chronic symmetric enlargement of the lacrimal glands. Scattered mild paranasal sinus mucosal thickening. Clear mastoid air cells. Other: None. IMPRESSION: 1. Patchy early subacute left MCA infarcts without extension from 04/13/2017. 2. No new infarct. 3. Similar appearance  of T2 hyperintensities throughout the cerebral white matter and deep gray nuclei compared to 2018. These may reflect a combination of chronic multiple sclerosis and chronic small vessel ischemia. 4. Numerous chronic microhemorrhages throughout the brain suggesting chronic hypertension. Electronically Signed   By: Sebastian Ache M.D.   On: 04/15/2017 12:11   US Carotid Bilateral (at [facility] Only)  Result Date: 04/08/2017 CLINICAL DATA:  TIA EXAM: BILATERAL CAROTID DUPLEX ULTRASOUND TECHNIQUE: Wallace Cullens scale imaging, color Doppler and duplex ultrasound were performed of bilateral carotid and vertebral arteries in the neck. COMPARISON:  None. FINDINGS: Criteria: Quantification of carotid stenosis is based on velocity parameters that correlate the residual internal carotid diameter with NASCET-based stenosis levels, using the diameter of the distal internal carotid lumen  as the denominator for stenosis measurement. The following velocity measurements were obtained: RIGHT ICA:  112 cm/sec CCA:  102 cm/sec SYSTOLIC ICA/CCA RATIO:  1.1 DIASTOLIC ICA/CCA RATIO:  1.2 ECA:  147 cm/sec LEFT ICA:  75 cm/sec CCA:  98 cm/sec SYSTOLIC ICA/CCA RATIO:  0.8 DIASTOLIC ICA/CCA RATIO:  0.7 ECA:  145 cm/sec RIGHT CAROTID ARTERY: Minimal intimal thickening in the bulb. Low resistance internal carotid Doppler pattern. RIGHT VERTEBRAL ARTERY:  Antegrade. LEFT CAROTID ARTERY: Little if any plaque in the bulb. Low resistance internal carotid Doppler pattern. LEFT VERTEBRAL ARTERY:  Antegrade. IMPRESSION: Less than 50% stenosis in the right and left internal carotid arteries. Electronically Signed   By: Jolaine Click M.D.   On: 04/08/2017 15:21   Ir Fluoro Guide Ndl Plmt / Bx  Result Date: 04/15/2017 CLINICAL DATA:  Multiple sclerosis EXAM: LUMBAR PUNCTURE UNDER FLUOROSCOPY FLUOROSCOPY TIME:  0.1 minutes; 14 uGym2 DAP TECHNIQUE: The procedure, risks (including but not limited to bleeding, infection, organ damage ), benefits, and alternatives were explained to the patient. Questions regarding the procedure were encouraged and answered. The patient understands and consents to the procedure. Patient was placed under general anesthetic by department of anesthesia. Patient was placed prone. An appropriate skin entry site was determined fluoroscopically. Operator donned sterile gloves and mask. Skin site was marked, then prepped with Betadine, draped in usual sterile fashion, and infiltrated locally with 1% lidocaine. A 24 gauge spinal needle advanced into the thecal sac at L3-4 from a right interlaminar approach. Clear colorless CSF spontaneously returned. 5ml CSF were collected for the requested laboratory studies. The needle was then removed. COMPLICATIONS: None immediate IMPRESSION: 1. Technically successful lumbar puncture under fluoroscopy. Electronically Signed   By: Corlis Leak M.D.   On: 04/15/2017  10:24   Dg Chest Portable 1 View  Result Date: 04/13/2017 CLINICAL DATA:  Cough.  Possible aspiration. EXAM: PORTABLE CHEST 1 VIEW COMPARISON:  Frontal and lateral views 06/04/2014 FINDINGS: Unchanged prominent cardiac silhouette. Minimal bibasilar atelectasis. No pulmonary edema, confluent airspace disease, large pleural effusion or pneumothorax. Body habitus and low lung volumes limit assessment. IMPRESSION: Hypoventilatory chest with mild bibasilar atelectasis. Similar enlarged cardiac silhouette. Electronically Signed   By: Rubye Oaks M.D.   On: 04/13/2017 01:41    Echocardiogram  Reviewed,  EF preserved  12-lead ECG all ekgs in epic are reviewed and reveal sinus rhythm, no afib  Telemetry sinus rhythm   Assessment and Plan 1. Cryptogenic stroke  The patient has had a stroke of unknown cause.  She has profound deficits remain which limit her ability to interact with device clinic staff for remote monitoring.  In addition, she will likely require SNF at discharge.  I would therefore not recommend implantable loop  recorder at this time.  I would advise 30 day monitor and follow-up with neurology.  If no arrhythmia is seen in her monitor then further consideration for implantable loop recorder can be made once her stroke symptoms are improved.  Electrophysiology team to see as needed while here. Please call with questions.   Randolm Idol 04/16/2017, 7:22 PM

## 2017-04-16 NOTE — Progress Notes (Signed)
Echocardiogram Echocardiogram Transesophageal has been performed.  Rachel Gilbert 04/16/2017, 10:14 AM

## 2017-04-16 NOTE — Interval H&P Note (Signed)
History and Physical Interval Note:  04/16/2017 9:12 AM  Rachel Gilbert  has presented today for surgery, with the diagnosis of stroke  The various methods of treatment have been discussed with the patient and family. After consideration of risks, benefits and other options for treatment, the patient has consented to  Procedure(s): TRANSESOPHAGEAL ECHOCARDIOGRAM (TEE) WITH LOOP (N/A) as a surgical intervention .  The patient's history has been reviewed, patient examined, no change in status, stable for surgery.  I have reviewed the patient's chart and labs.  Questions were answered to the patient's satisfaction.     Charlton Haws

## 2017-04-16 NOTE — Progress Notes (Signed)
Telephone consent obtained for TEE with writer and Liz Honeycutt, RN from [facility]. Patient designates this person as emergency contact.  

## 2017-04-16 NOTE — Progress Notes (Signed)
PROGRESS NOTE    Rachel Gilbert  ZOX:[id number] DOB: [birth year] DOA: 04/12/2017 PCP: Josph Macho, FNP   Outpatient Specialists:     Brief Narrative:  Rachel Gilbert is a 57 y.o. female with medical history significant of HTN, MS, OSA on CPAP, and TIA; who presents with altered mental status.   Around 8:10 a.m. 5 days ago(3/13), the patient had called her best friend and was noted to have significantly slurred speech at that time over the phone.  She was also noted to have a mild right-sided facial droop.  She was taken to [facility] regional and evaluated for stroke versus MS flare.  CT scan showed decreased attenuation in the periventricular white matter adjacent to the frontal horns or demyelination versus small vessel disease could not be delineated.  Neurology was consulted and it was decided to give the patient high-dose steroids which she received for 3 days. MRI was recommended but could not be obtained.  Patient seem to be slightly improved, but speech was still somewhat slurred when she was discharged home on 3/16.  the day before re-admission, patient was found to have worsening slurred speech with inability to work on and very emotional which was unusual for her. s/p MRI and LP under sedation with MRI showing CVA.  TEE has been done--- no need for Loop but will need 30 day event monitor.       Assessment & Plan:   Principal Problem:   CVA (cerebral vascular accident) [facility]) Active Problems:   Essential hypertension   Obesity   Obstructive sleep apnea on CPAP   Multiple sclerosis (HCC)   Depression with anxiety   Dysphagia   CVA, dysphagia, apraxia:  -Seen at [facility] on 3/13 for the same.  She underwent echocardiogram showing EF of 55-65%, bilateral carotid ultrasound showing less than 50% stenosis. repeat CT scan shows area of concern in the left frontal lobe in the distribution of the left MCA for subacute stroke. -s/p MRI/LP under sedation-- spinal fluid  sent for JC virus (has MS and is immunocompromised) -MRI shows CVA -hypercoaguable work up in progress- negative thus far -SNF eventually -HIV negative -cardiology consulted for TEE- no clot seen -will need 30 day event monitor instead of Loop -statin: lipitor 80  Acute kidney injury secondary to dehydration: Patient's creatinine previously appears to have been around 1.07 on 04/08/17 but noted to be 1.1 admission with BUN 39.   -prerenal cause of symptoms. -monitor BMP- Cr back to baseline  Leukocytosis: Acute.  Initial WBC 16.  Patient was recently treated with 3 days of high-dose steroids for possible MS. - trending down  Multiple sclerosis: Patient followed by Dr. Marzetta Merino neurology in the outpatient setting and takes natalizumab. - Continue baclofen  Hypokalemia -replete as able  Essential hypertension - Continue Coreg and hydralazine - resume ACE-- monitor K and Cr closely  Hyperlipidemia: - statin  OSA on CPAP -  CPAP QHS  Morbid obesity Body mass index is 40.17 kg/m.     DVT prophylaxis:  Lovenox   Code Status: Full Code   Family Communication: None at bedside  Disposition Plan:  SNF when bed available   Consultants:  Neuro Cardiology IR  Subjective: Back from TEE, tearful  Objective: Vitals:   04/16/17 0955 04/16/17 1005 04/16/17 1034 04/16/17 1146  BP: (!) 163/80 (!) 174/82 136/85 (!) 144/75  Pulse: 63 66 66 73  Resp: 19 17 18 18   Temp:  97.7 F (36.5 C) 97.7 F (36.5 C)  97.6 F (36.4 C)  TempSrc:  Oral Oral Oral  SpO2: 98% 99% 98% 93%  Weight:      Height:        Intake/Output Summary (Last 24 hours) at 04/16/2017 1309 Last data filed at 04/16/2017 0815 Gross per 24 hour  Intake 290 ml  Output 600 ml  Net -310 ml   Filed Weights   04/12/17 1947 04/13/17 0258 04/16/17 0828  Weight: 122.5 kg (270 lb) 123.6 kg (272 lb 7.8 oz) 123.4 kg (272 lb)    Examination:  General exam: in chair, tearful Respiratory  system: diminished, no increased work of breathing Cardiovascular system: rrr Gastrointestinal system: +Bs, soft Central nervous system: not speaking (nursing states she will say a few word to them)     Data Reviewed: I have personally reviewed following labs and imaging studies  CBC: Recent Labs  Lab 04/12/17 1954 04/12/17 1959 04/13/17 1104 04/14/17 0634 04/16/17 0646  WBC 16.0*  --  10.5 9.3 12.1*  NEUTROABS 11.6*  --   --   --   --   HGB 15.0 15.3* 13.7 13.4 12.9  HCT 47.8* 45.0 44.2 42.4 41.0  MCV 92.3  --  93.2 92.4 91.1  PLT 264  --  201 196 200   Basic Metabolic Panel: Recent Labs  Lab 04/12/17 1954 04/12/17 1959 04/13/17 1104 04/14/17 0634 04/16/17 0646  NA 137 143 141 141 139  K 3.6 3.6 3.2* 3.3* 3.2*  CL 106 109 110 107 108  CO2 20*  --  22 24 22   GLUCOSE 124* 112* 117* 100* 104*  BUN 39* 39* 34* 26* 19  CREATININE 1.33* 1.40* 1.14* 1.01* 0.93  CALCIUM 8.5*  --  7.9* 8.3* 8.3*   GFR: Estimated Creatinine Clearance: 96.1 mL/min (by C-G formula based on SCr of 0.93 mg/dL). Liver Function Tests: Recent Labs  Lab 04/12/17 1954  AST 22  ALT 24  ALKPHOS 70  BILITOT 0.8  PROT 6.2*  ALBUMIN 3.6   No results for input(s): LIPASE, AMYLASE in the last 168 hours. No results for input(s): AMMONIA in the last 168 hours. Coagulation Profile: Recent Labs  Lab 04/12/17 1954  INR 1.02   Cardiac Enzymes: No results for input(s): CKTOTAL, CKMB, CKMBINDEX, TROPONINI in the last 168 hours. BNP (last 3 results) No results for input(s): PROBNP in the last 8760 hours. HbA1C: No results for input(s): HGBA1C in the last 72 hours. CBG: No results for input(s): GLUCAP in the last 168 hours. Lipid Profile: No results for input(s): CHOL, HDL, LDLCALC, TRIG, CHOLHDL, LDLDIRECT in the last 72 hours. Thyroid Function Tests: No results for input(s): TSH, T4TOTAL, FREET4, T3FREE, THYROIDAB in the last 72 hours. Anemia Panel: No results for input(s): VITAMINB12,  FOLATE, FERRITIN, TIBC, IRON, RETICCTPCT in the last 72 hours. Urine analysis:    Component Value Date/Time   COLORURINE YELLOW 04/13/2017 0048   APPEARANCEUR CLEAR 04/13/2017 0048   LABSPEC 1.015 04/13/2017 0048   PHURINE 5.0 04/13/2017 0048   GLUCOSEU 50 (A) 04/13/2017 0048   HGBUR NEGATIVE 04/13/2017 0048   BILIRUBINUR NEGATIVE 04/13/2017 0048   KETONESUR NEGATIVE 04/13/2017 0048   PROTEINUR NEGATIVE 04/13/2017 0048   NITRITE NEGATIVE 04/13/2017 0048   LEUKOCYTESUR NEGATIVE 04/13/2017 0048      Recent Results (from the past 240 hour(s))  Culture, blood (Routine X 2) w Reflex to ID Panel     Status: None (Preliminary result)   Collection Time: 04/13/17  5:58 AM  Result Value Ref Range Status  Specimen Description BLOOD LEFT HAND  Final   Special Requests   Final    BOTTLES DRAWN AEROBIC AND ANAEROBIC Blood Culture adequate volume   Culture   Final    NO GROWTH 2 DAYS Performed at [facility], [address] [place], [place] [address]    Report Status PENDING  Incomplete  Culture, blood (Routine X 2) w Reflex to ID Panel     Status: None (Preliminary result)   Collection Time: 04/13/17  6:03 AM  Result Value Ref Range Status   Specimen Description BLOOD RIGHT HAND  Final   Special Requests   Final    BOTTLES DRAWN AEROBIC AND ANAEROBIC Blood Culture results may not be optimal due to an inadequate volume of blood received in culture bottles   Culture   Final    NO GROWTH 2 DAYS Performed at [facility], [address] [place], [place] [address]    Report Status PENDING  Incomplete      Anti-infectives (From admission, onward)   None       Radiology Studies: Mr Laqueta Jean Wo Contrast  Result Date: 04/15/2017 CLINICAL DATA:  Stroke follow-up.  History of multiple sclerosis. EXAM: MRI HEAD WITHOUT AND WITH CONTRAST TECHNIQUE: Multiplanar, multiecho pulse sequences of the brain and surrounding structures were obtained without and with intravenous  contrast. CONTRAST:  20 mL MultiHance COMPARISON:  Incomplete brain MRI 04/13/2017.  07/02/2016 brain MRI. FINDINGS: The examination was performed under general anesthesia. Brain: Patchy to confluent restricted diffusion involving white matter greater than cortex in the left frontal lobe in the MCA territory is unchanged from the recent prior MRI. There is associated T2 hyperintensity reflecting cytotoxic edema which has slightly increased without mass effect. There is a subcentimeter focus of enhancement associated with an area of cortical infarction. Scattered bilateral subcortical and deep cerebral white matter T2 hyperintensities elsewhere have not significantly changed from the 2018 study. Multiple foci of encephalomalacia/chronic lacunar infarcts are again seen involving the corpus callosum, left thalamus, and left greater than right basal ganglia/deep white matter. Mild prominence of the pituitary gland, not measuring 9 mm in height, is unchanged from 2018 and without a focal lesion evident on this nondedicated study. Numerous chronic microhemorrhages are again seen in both cerebral and cerebellar hemispheres, including in the basal ganglia and thalami as well as in the brainstem. There is no mass, midline shift, or extra-axial fluid collection. Vascular: Major intracranial vascular flow voids are preserved. Skull and upper cervical spine: Unremarkable bone marrow signal. Sinuses/Orbits: Chronic symmetric enlargement of the lacrimal glands. Scattered mild paranasal sinus mucosal thickening. Clear mastoid air cells. Other: None. IMPRESSION: 1. Patchy early subacute left MCA infarcts without extension from 04/13/2017. 2. No new infarct. 3. Similar appearance of T2 hyperintensities throughout the cerebral white matter and deep gray nuclei compared to 2018. These may reflect a combination of chronic multiple sclerosis and chronic small vessel ischemia. 4. Numerous chronic microhemorrhages throughout the brain  suggesting chronic hypertension. Electronically Signed   By: Sebastian Ache M.D.   On: 04/15/2017 12:11   Ir Fluoro Guide Ndl Plmt / Bx  Result Date: 04/15/2017 CLINICAL DATA:  Multiple sclerosis EXAM: LUMBAR PUNCTURE UNDER FLUOROSCOPY FLUOROSCOPY TIME:  0.1 minutes; 14 uGym2 DAP TECHNIQUE: The procedure, risks (including but not limited to bleeding, infection, organ damage ), benefits, and alternatives were explained to the patient. Questions regarding the procedure were encouraged and answered. The patient understands and consents to the procedure. Patient was placed  under general anesthetic by department of anesthesia. Patient was placed prone. An appropriate skin entry site was determined fluoroscopically. Operator donned sterile gloves and mask. Skin site was marked, then prepped with Betadine, draped in usual sterile fashion, and infiltrated locally with 1% lidocaine. A 24 gauge spinal needle advanced into the thecal sac at L3-4 from a right interlaminar approach. Clear colorless CSF spontaneously returned. 5ml CSF were collected for the requested laboratory studies. The needle was then removed. COMPLICATIONS: None immediate IMPRESSION: 1. Technically successful lumbar puncture under fluoroscopy. Electronically Signed   By: Corlis Leak M.D.   On: 04/15/2017 10:24        Scheduled Meds: . aspirin EC  81 mg Oral Daily  . atorvastatin  80 mg Oral q1800  . carvedilol  25 mg Oral BID WC  . clopidogrel  75 mg Oral Daily  . famotidine  40 mg Oral QHS  . hydrALAZINE  50 mg Oral BID  . lisinopril  40 mg Oral Daily  . oxybutynin  15 mg Oral QHS   Continuous Infusions:    LOS: 3 days    Time spent: 25 min    Joseph Art, DO [facility] Pager [phone]  If 7PM-7AM, please contact night-coverage [email or web address] Password [facility] 04/16/2017, 1:09 PM

## 2017-04-16 NOTE — Progress Notes (Signed)
Physical Therapy Treatment Patient Details Name: Rachel Gilbert MRN: [id number] DOB: Feb 16, 1960 Today's Date: 04/16/2017    History of Present Illness  Chidinma Garl is a 57 y.o. female with medical history significant of HTN, MS, HOH, OSA on CPAP, and TIA; who presents with altered mental status, slurred speech, wording finding. Brain MRI-Multiple areas of both focal and confluent restricted diffusion in the left MCA and left frontal lobe representing acute infarct.     PT Comments    PT session with slight overlap with OT at end. Pt very limited secondary to fatigue as pt had a procedure earlier today. Pt with poor tolerance to interventions. She ambulated a short distance within her room with 1HHA, min guard to min A required for stability. Pt very flat and slow to process tasks and commands. Updated PT recommendations to SNF per family request. Pt would continue to benefit from skilled physical therapy services at this time while admitted and after d/c to address the below listed limitations in order to improve overall safety and independence with functional mobility.    Follow Up Recommendations  SNF;Supervision/Assistance - 24 hour     Equipment Recommendations  None recommended by PT    Recommendations for Other Services       Precautions / Restrictions Precautions Precautions: Fall Restrictions Weight Bearing Restrictions: No    Mobility  Bed Mobility Overal bed mobility: Modified Independent             General bed mobility comments: increased time  Transfers Overall transfer level: Needs assistance Equipment used: None Transfers: Sit to/from Stand Sit to Stand: Min guard Stand pivot transfers: Min guard       General transfer comment: min guard for safety, pt stood from toilet x1 and from EOB x1  Ambulation/Gait Ambulation/Gait assistance: Min guard;Min assist Ambulation Distance (Feet): 15 Feet(15' x2 with sitting rest break to toilet) Assistive device: 1  person hand held assist Gait Pattern/deviations: Step-through pattern;Wide base of support;Decreased stride length Gait velocity: decreased Gait velocity interpretation: Below normal speed for age/gender General Gait Details: very slow, mildly unsteady gait with 1HHA for support; very wide BoS   Stairs            Wheelchair Mobility    Modified Rankin (Stroke Patients Only) Modified Rankin (Stroke Patients Only) Pre-Morbid Rankin Score: No symptoms Modified Rankin: Moderately severe disability     Balance Overall balance assessment: Needs assistance Sitting-balance support: Feet supported;No upper extremity supported Sitting balance-Leahy Scale: Good     Standing balance support: During functional activity Standing balance-Leahy Scale: Fair                              Cognition Arousal/Alertness: Awake/alert Behavior During Therapy: Flat affect Overall Cognitive Status: Impaired/Different from baseline Area of Impairment: Attention;Safety/judgement;Awareness;Problem solving                       Following Commands: Follows multi-step commands consistently Safety/Judgement: Decreased awareness of deficits Awareness: Emergent Problem Solving: Slow processing        Exercises      General Comments        Pertinent Vitals/Pain Pain Assessment: No/denies pain    Home Living                      Prior Function            PT Goals (current goals can now  be found in the care plan section) Acute Rehab PT Goals Patient Stated Goal: to be able to talk and return to PLOF PT Goal Formulation: With patient Time For Goal Achievement: 04/27/17 Potential to Achieve Goals: Good Progress towards PT goals: Progressing toward goals    Frequency    Min 3X/week      PT Plan Discharge plan needs to be updated;Frequency needs to be updated    Co-evaluation              AM-PAC PT "6 Clicks" Daily Activity  Outcome  Measure  Difficulty turning over in bed (including adjusting bedclothes, sheets and blankets)?: None Difficulty moving from lying on back to sitting on the side of the bed? : None Difficulty sitting down on and standing up from a chair with arms (e.g., wheelchair, bedside commode, etc,.)?: A Little Help needed moving to and from a bed to chair (including a wheelchair)?: A Little Help needed walking in hospital room?: A Little Help needed climbing 3-5 steps with a railing? : A Lot 6 Click Score: 19    End of Session Equipment Utilized During Treatment: Gait belt Activity Tolerance: Patient limited by fatigue Patient left: in bed;with call bell/phone within reach;with bed alarm set Nurse Communication: Mobility status PT Visit Diagnosis: Other abnormalities of gait and mobility (R26.89);Muscle weakness (generalized) (M62.81);Hemiplegia and hemiparesis;Difficulty in walking, not elsewhere classified (R26.2) Hemiplegia - Right/Left: Right Hemiplegia - dominant/non-dominant: Dominant Hemiplegia - caused by: Cerebral infarction     Time: 1610-9604 PT Time Calculation (min) (ACUTE ONLY): 11 min  Charges:  $Therapeutic Activity: 8-22 mins                    G Codes:       [place], [place], [place] [phone]    Alessandra Bevels Alexi Dorminey 04/16/2017, 5:45 PM

## 2017-04-16 NOTE — Anesthesia Postprocedure Evaluation (Signed)
Anesthesia Post Note  Patient: Rachel Gilbert  Procedure(s) Performed: LUMBER PUNCTURE MRI SCAN (N/A )     Patient location during evaluation: PACU Anesthesia Type: General Level of consciousness: awake and alert Pain management: pain level controlled Vital Signs Assessment: post-procedure vital signs reviewed and stable Respiratory status: spontaneous breathing, nonlabored ventilation, respiratory function stable and patient connected to nasal cannula oxygen Cardiovascular status: blood pressure returned to baseline and stable Postop Assessment: no apparent nausea or vomiting Anesthetic complications: no    Last Vitals:  Vitals Value Taken Time  BP    Temp    Pulse    Resp    SpO2      Last Pain:  Vitals:   04/16/17 0416  TempSrc: Axillary  PainSc:                  Della Scrivener DAVID

## 2017-04-16 NOTE — NC FL2 (Signed)
[place] MEDICAID FL2 LEVEL OF CARE SCREENING TOOL     IDENTIFICATION  Patient Name: Rachel Gilbert Birthdate: Dec 08, 1960 Sex: female Admission Date (Current Location): 04/12/2017  [facility] [place] Number:  Chiropodist and Address:  [facility], [address] [place], [place] [address]      Provider Number: [id number]  Attending Physician Name and Address:  Joseph Art, DO  Relative Name and Phone Number:       Current Level of Care: Hospital Recommended Level of Care: Skilled Nursing Facility Prior Approval Number:    Date Approved/Denied:   PASRR Number: [id number]  Discharge Plan: SNF    Current Diagnoses: Patient Active Problem List   Diagnosis Date Noted  . Dysphagia   . CVA (cerebral vascular accident) (HCC) 04/13/2017  . TIA (transient ischemic attack) 04/08/2017  . Attention deficit 11/06/2016  . 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 Insomnia 11/16/2014  . Other fatigue 07/03/2014  . Urinary frequency 07/03/2014  . Gait disturbance 06/14/2014  . Depression with anxiety 06/14/2014  . Vitamin D deficiency 06/14/2014  . Multiple sclerosis exacerbation (HCC) 06/04/2014  . Essential hypertension 06/04/2014  . Obesity 06/04/2014  . Obstructive sleep apnea on CPAP 06/04/2014  . Gastroesophageal reflux disease without esophagitis 06/04/2014  . Multiple sclerosis (HCC) 06/04/2014    Orientation RESPIRATION BLADDER Height & Weight     Self, Time, Situation, Place  Other (Comment)(CPAP at night) Incontinent, External catheter Weight: 272 lb (123.4 kg) Height:  5\' 9"  (175.3 cm)  BEHAVIORAL SYMPTOMS/MOOD NEUROLOGICAL BOWEL NUTRITION STATUS      Continent Diet(cardiac)  AMBULATORY STATUS COMMUNICATION OF NEEDS Skin   Limited Assist Verbally Normal                       Personal Care Assistance Level of Assistance  Bathing, Dressing Bathing Assistance: Limited assistance   Dressing Assistance: Limited assistance    Functional Limitations Info             SPECIAL CARE FACTORS FREQUENCY  PT (By licensed PT), OT (By licensed OT), Speech therapy     PT Frequency: 5/wk OT Frequency: 5/wk     Speech Therapy Frequency: 2/wk      Contractures      Additional Factors Info  Code Status, Allergies Code Status Info: FULL Allergies Info: Oxycodone           Current Medications (04/16/2017):  This is the current hospital active medication list Current Facility-Administered Medications  Medication Dose Route Frequency Provider Last Rate Last Dose  . acetaminophen (TYLENOL) tablet 650 mg  650 mg Oral Q4H PRN Wendall Stade, MD       Or  . acetaminophen (TYLENOL) solution 650 mg  650 mg Per Tube Q4H PRN Wendall Stade, MD       Or  . acetaminophen (TYLENOL) suppository 650 mg  650 mg Rectal Q4H PRN Wendall Stade, MD      . albuterol (PROVENTIL) (2.5 MG/3ML) 0.083% nebulizer solution 2.5 mg  2.5 mg Nebulization Q2H PRN Wendall Stade, MD      . ALPRAZolam Prudy Feeler) tablet 0.5 mg  0.5 mg Oral BID PRN Wendall Stade, MD   0.5 mg at 04/16/17 1436  . aspirin EC tablet 81 mg  81 mg Oral Daily Wendall Stade, MD   81 mg at 04/16/17 1041  . atorvastatin (LIPITOR) tablet 80 mg  80 mg Oral q1800 Wendall Stade, MD  80 mg at 04/15/17 1721  . baclofen (LIORESAL) tablet 10 mg  10 mg Oral TID PRN Wendall Stade, MD      . carvedilol (COREG) tablet 25 mg  25 mg Oral BID WC Wendall Stade, MD   25 mg at 04/16/17 1042  . clopidogrel (PLAVIX) tablet 75 mg  75 mg Oral Daily Wendall Stade, MD   75 mg at 04/16/17 1041  . famotidine (PEPCID) tablet 40 mg  40 mg Oral QHS Wendall Stade, MD   40 mg at 04/15/17 2230  . gadobenate dimeglumine (MULTIHANCE) injection 20 mL  20 mL Intravenous Once PRN Wendall Stade, MD      . hydrALAZINE (APRESOLINE) tablet 50 mg  50 mg Oral BID Wendall Stade, MD   50 mg at 04/16/17 1041  . lisinopril (PRINIVIL,ZESTRIL) tablet 40 mg  40 mg Oral Daily Wendall Stade, MD   40  mg at 04/16/17 1042  . oxybutynin (DITROPAN XL) 24 hr tablet 15 mg  15 mg Oral QHS Wendall Stade, MD   15 mg at 04/15/17 2230  . senna-docusate (Senokot-S) tablet 1 tablet  1 tablet Oral QHS PRN Wendall Stade, MD         Discharge Medications: Please see discharge summary for a list of discharge medications.  Relevant Imaging Results:  Relevant Lab Results:   Additional Information SS#: [id number]  Burna Sis, LCSW

## 2017-04-16 NOTE — H&P (View-Only) (Signed)
Telephone consent obtained for TEE with writer and Harold Barban, RN from [facility]. Patient designates this person as emergency contact.

## 2017-04-16 NOTE — Plan of Care (Signed)
  Problem: Education: Goal: Knowledge of General Education information will improve Outcome: Progressing   Problem: Health Behavior/Discharge Planning: Goal: Ability to manage health-related needs will improve Outcome: Progressing   Problem: Clinical Measurements: Goal: Ability to maintain clinical measurements within normal limits will improve Outcome: Progressing Goal: Will remain free from infection Outcome: Progressing Goal: Diagnostic test results will improve Outcome: Progressing Goal: Respiratory complications will improve Outcome: Progressing Goal: Cardiovascular complication will be avoided Outcome: Progressing   Problem: Activity: Goal: Risk for activity intolerance will decrease Outcome: Progressing   Problem: Nutrition: Goal: Adequate nutrition will be maintained Outcome: Progressing   Problem: Coping: Goal: Level of anxiety will decrease Outcome: Progressing   Problem: Elimination: Goal: Will not experience complications related to bowel motility Outcome: Progressing Goal: Will not experience complications related to urinary retention Outcome: Progressing   Problem: Pain Managment: Goal: General experience of comfort will improve Outcome: Progressing   Problem: Safety: Goal: Ability to remain free from injury will improve Outcome: Progressing   Problem: Skin Integrity: Goal: Risk for impaired skin integrity will decrease Outcome: Progressing   Problem: Education: Goal: Knowledge of disease or condition will improve Outcome: Progressing Goal: Knowledge of secondary prevention will improve Outcome: Progressing Goal: Knowledge of patient specific risk factors addressed and post discharge goals established will improve Outcome: Progressing   Problem: Coping: Goal: Will verbalize positive feelings about self Outcome: Progressing Goal: Will identify appropriate support needs Outcome: Progressing   Problem: Health Behavior/Discharge  Planning: Goal: Ability to manage health-related needs will improve Outcome: Progressing   Problem: Self-Care: Goal: Ability to participate in self-care as condition permits will improve Outcome: Progressing Goal: Verbalization of feelings and concerns over difficulty with self-care will improve Outcome: Progressing Goal: Ability to communicate needs accurately will improve Outcome: Progressing   Problem: Nutrition: Goal: Risk of aspiration will decrease Outcome: Progressing Goal: Dietary intake will improve Outcome: Progressing   Problem: Ischemic Stroke/TIA Tissue Perfusion: Goal: Complications of ischemic stroke/TIA will be minimized Outcome: Progressing   

## 2017-04-16 NOTE — Clinical Social Work Note (Signed)
Clinical Social Work Assessment  Patient Details  Name: Teryl Osterhoudt MRN: [id number] Date of Birth: 11-Dec-1960  Date of referral:  04/15/17               Reason for consult:  Facility Placement                Permission sought to share information with:  Facility Medical sales representative, Family Supports Permission granted to share information::  Yes, Verbal Permission Granted  Name::     Sallye Ober  Agency::  SNF  Relationship::  Friend, Daughter-in-law  Contact Information:     Housing/Transportation Living arrangements for the past 2 months:  Single Family Home Source of Information:  Friend/Neighbor Patient Interpreter Needed:  None Criminal Activity/Legal Involvement Pertinent to Current Situation/Hospitalization:  No - Comment as needed Significant Relationships:  Friend, Adult Children, Parents Lives with:  Self Do you feel safe going back to the place where you live?  Yes Need for family participation in patient care:  Yes (Comment)(patient has expressive aphasia)  Care giving concerns:  Patient lives home alone and will need short term rehab at discharge.   Social Worker assessment / plan:  CSW alerted by rehab admissions coordinator that patient will not qualify for CIR due to no caregiver support. CSW contacted patient's friend, [name], by phone to discuss patient's situation and obtain information. CSW discussed family situation and support with patient's friend. CSW explained role and options available for patient and family. CSW to continue to follow.  Employment status:  Other (Comment) Insurance information:  Teacher, English as a foreign language PT Recommendations:  Inpatient Rehab Consult Information / Referral to community resources:  Skilled Nursing Facility  Patient/Family's Response to care:  Patient's friend discussed how the patient will need SNF placement, but that CSW will need to speak with patient's son or daughter-in-law first.  Patient/Family's Understanding of and  Emotional Response to Diagnosis, Current Treatment, and Prognosis:  Patient's friend discussed family situation, about how the patient's mom initially said she'd come and stay with the patient to provide support at discharge but then backed out. Patient's friend indicated understanding that it would mean that the patient would need SNF placement. Patient's friend is going to be calling patient's son and daughter-in-law to discuss change in situation, because she didn't want to give them the news at work. Patient's friend will be providing patient's son and daughter-in-law with CSW phone number to call tomorrow.  Emotional Assessment Appearance:  Appears stated age Attitude/Demeanor/Rapport:  Engaged Affect (typically observed):  Pleasant Orientation:  Oriented to Self, Oriented to Place Alcohol / Substance use:  Not Applicable Psych involvement (Current and /or in the community):  No (Comment)  Discharge Needs  Concerns to be addressed:  Care Coordination Readmission within the last 30 days:  Yes Current discharge risk:  Lives alone, Dependent with Mobility, Physical Impairment Barriers to Discharge:  Continued Medical Work up, Insurance Authorization   Baldemar Lenis, [place] 04/16/2017, 3:19 PM

## 2017-04-16 NOTE — Progress Notes (Signed)
Patient left room for TEE at this time.    Sim Boast, RN

## 2017-04-16 NOTE — Progress Notes (Signed)
PT Cancellation Note  Patient Details Name: Rachel Gilbert MRN: [id number] DOB: [birth year]   Cancelled Treatment:    Reason Eval/Treat Not Completed: Patient at procedure or test/unavailable. Pt off of the floor for a TEE. PT will continue to follow up with pt as available.    Alessandra Bevels Sibyl Mikula 04/16/2017, 9:50 AM

## 2017-04-16 NOTE — Progress Notes (Signed)
Chaplain visited with the PT but the PT did not want to talk at this time.  The body language of the PT said that she wanted to talk but she declined.  Timopthy Tia Masker, Chaplain

## 2017-04-16 NOTE — Plan of Care (Signed)
  Problem: Health Behavior/Discharge Planning: Goal: Ability to manage health-related needs will improve Outcome: Progressing   Problem: Education: Goal: Knowledge of General Education information will improve Outcome: Progressing   Problem: Health Behavior/Discharge Planning: Goal: Ability to manage health-related needs will improve Outcome: Progressing   Problem: Clinical Measurements: Goal: Ability to maintain clinical measurements within normal limits will improve Outcome: Progressing Goal: Will remain free from infection Outcome: Progressing Goal: Diagnostic test results will improve Outcome: Progressing Goal: Respiratory complications will improve Outcome: Progressing   Problem: Education: Goal: Knowledge of General Education information will improve Outcome: Progressing   Problem: Health Behavior/Discharge Planning: Goal: Ability to manage health-related needs will improve Outcome: Progressing   Problem: Clinical Measurements: Goal: Ability to maintain clinical measurements within normal limits will improve Outcome: Progressing   Problem: Clinical Measurements: Goal: Will remain free from infection Outcome: Progressing   Problem: Clinical Measurements: Goal: Diagnostic test results will improve Outcome: Progressing   Problem: Clinical Measurements: Goal: Respiratory complications will improve Outcome: Progressing

## 2017-04-16 NOTE — Progress Notes (Signed)
STROKE TEAM PROGRESS NOTE   HISTORY OF PRESENT ILLNESS (per record) Rachel Gilbert is an 57 y.o. female with PMH of MS, HTN, sleep apnea recently admitted at [facility] hospital  presents to [facility] emergency room for worsening speech since being discharged from the hospital. She presented on 3/13 with aphasia and slurred speech, right-sided weakness. She was seen by Dr. Reynolds/neurologist who felt the patient had either a stroke or MS flare. An MRI was attempted but did not tolerate it due to claustrophobia. She underwent carotid Doppler and echocardiogram. Received 3 days of solumedrol  and was discharged from outpatient follow-up. CT scan was performed here at  [facility], showing a left frontal hypodensity. Family felt her symptoms were worse and therefore she was admitted to hospitalist team and neurology was consulted.   SUBJECTIVE (INTERVAL HISTORY) No family members present.  The patient has  Has just returned from TEE which showed no cardiac source of embolism, clot or PFO. Loop recorder is pending.No new changes noted per chart Her expressive aphasia seems to be improving and she can speak short sentences and is able to read. OBJECTIVE Temp:  [97.5 F (36.4 C)-98.1 F (36.7 C)] 97.6 F (36.4 C) (03/21 1146) Pulse Rate:  [63-81] 73 (03/21 1146) Cardiac Rhythm: Sinus bradycardia (03/21 0705) Resp:  [14-30] 18 (03/21 1146) BP: (136-245)/(70-129) 144/75 (03/21 1146) SpO2:  [93 %-100 %] 93 % (03/21 1146) Weight:  [272 lb (123.4 kg)] 272 lb (123.4 kg) (03/21 0828)  CBC:  Recent Labs  Lab 04/12/17 1954  04/14/17 0634 04/16/17 0646  WBC 16.0*   < > 9.3 12.1*  NEUTROABS 11.6*  --   --   --   HGB 15.0   < > 13.4 12.9  HCT 47.8*   < > 42.4 41.0  MCV 92.3   < > 92.4 91.1  PLT 264   < > 196 200   < > = values in this interval not displayed.    Basic Metabolic Panel:  Recent Labs  Lab 04/14/17 0634 04/16/17 0646  NA 141 139  K 3.3* 3.2*  CL 107 108  CO2 24 22  GLUCOSE 100*  104*  BUN 26* 19  CREATININE 1.01* 0.93  CALCIUM 8.3* 8.3*    Lipid Panel:     Component Value Date/Time   CHOL 156 04/09/2017 0723   TRIG 142 04/09/2017 0723   HDL 46 04/09/2017 0723   CHOLHDL 3.4 04/09/2017 0723   VLDL 28 04/09/2017 0723   LDLCALC 82 04/09/2017 0723   HgbA1c:  Lab Results  Component Value Date   HGBA1C 5.5 04/09/2017   Urine Drug Screen:     Component Value Date/Time   LABOPIA NONE DETECTED 04/13/2017 0048   COCAINSCRNUR NONE DETECTED 04/13/2017 0048   COCAINSCRNUR NONE DETECTED 04/08/2017 0943   LABBENZ POSITIVE (A) 04/13/2017 0048   AMPHETMU NONE DETECTED 04/13/2017 0048   THCU NONE DETECTED 04/13/2017 0048   LABBARB NONE DETECTED 04/13/2017 0048    Alcohol Level     Component Value Date/Time   ETH <10 04/13/2017 0111    IMAGING   Ct Head Wo Contrast 04/12/2017 IMPRESSION:  1. Area of increasing low-attenuation in the left frontal lobe white matter concerning for evolving subacute ischemia (axial image 20 of series 3 and coronal image 33 of series 5) in the left MCA distribution. This could be confirmed with brain MRI without contrast if clinically appropriate.  2. Chronic microvascular ischemic changes in cerebral white matter.    Dg  Chest Portable 1 View 04/13/2017 IMPRESSION:  Hypoventilatory chest with mild bibasilar atelectasis. Similar enlarged cardiac silhouette.    MR Brain W Wo Contrast  04/13/2017 IMPRESSION: The patient refused to continue the exam, resulting in an incomplete study. Sensitivity and specificity are reduced. Multiple areas of both focal and confluent restricted diffusion within the LEFT MCA territory, LEFT frontal predominant, representing nonhemorrhagic acute infarction, corresponding with the recent CT. Multiple white matter lesions, periventricular predominant, consistent with chronic multiple sclerosis.   Transthoracic Echocardiogram  04/08/2017 Study Conclusions - Procedure narrative: Transthoracic  echocardiography. The study   was technically difficult. - Left ventricle: The cavity size was normal. Wall thickness was   increased in a pattern of moderate LVH. Systolic function was   normal. The estimated ejection fraction was in the range of 55% to 65%. - Aortic valve: Valve area (Vmax): 2.37 cm^2.  TEE 04/16/2017 normal  Bilateral Carotid Dopplers  04/08/2017 RIGHT CAROTID ARTERY: Minimal intimal thickening in the bulb. Low resistance internal carotid Doppler pattern. RIGHT VERTEBRAL ARTERY:  Antegrade. LEFT CAROTID ARTERY: Little if any plaque in the bulb. Low resistance internal carotid Doppler pattern. LEFT VERTEBRAL ARTERY:  Antegrade. IMPRESSION: Less than 50% stenosis in the right and left internal carotid arteries.  Hypercoagulable panel negative lupus anticoagulant, antithrombin III, protein CNS activity, beta 2 glycoprotein's. Homocystine marginally elevated at 16.  PHYSICAL EXAM Vitals:   04/16/17 0955 04/16/17 1005 04/16/17 1034 04/16/17 1146  BP: (!) 163/80 (!) 174/82 136/85 (!) 144/75  Pulse: 63 66 66 73  Resp: 19 17 18 18   Temp:  97.7 F (36.5 C) 97.7 F (36.5 C) 97.6 F (36.4 C)  TempSrc:  Oral Oral Oral  SpO2: 98% 99% 98% 93%  Weight:      Height:       Pleasant middle-aged obese Caucasian lady currently not in distress. . Afebrile. Head is nontraumatic. Neck is supple without bruit.    Cardiac exam no murmur or gallop. Lungs are clear to auscultation. Distal pulses are well felt. Neurological Exam ;  Awake and alert nonfluent speech with child-quality with some word hesitancy. Able to speaks short sentences now andname and repeat well. Good comprehension. No paraphasias.extraocular moments are full range without nystagmus. Blinks to threat bilaterally. Mild right lower facial weakness. Tongue midline. Motor system exam mild right hemiparesis with weakness of right grip and intrinsic hand muscles. Orbits left over right upper extremity. Mild weakness of  right hip flexors and ankle dorsiflexors.ensation is intact bilaterally. She is able  to ambulate with slight favoring of the right leg  ASSESSMENT/PLAN [name] is a 57 y.o. female with history of hypertension, hyperlipidemia, claustrophobia, obstructive sleep apnea, and multiple sclerosis presenting with speech difficulties. She did not receive IV t-PA due to late presentation.  Stroke:  left MCA distribution large subcorticalinfarct -  source unknown. Versus multiple sclerosis lesion ? PML given periventricular location and patient is on long-term Tysabri for her MS  Resultant - expressive aphasia  CT head - Area of increasing low-attenuation in the left frontal lobe white matter.  MRI head - incomplete study - Lt MCA subcorticalacute infarct and findings c/w MS.  MRA head - not performed  CTA Head - pending  Carotid Doppler - Less than 50% stenosis in the right and left internal carotid arteries.  2D Echo - EF 55-60%.  No cardiac source of emboli identified.  Transcranial Dopplers with bubble study - negative for PFO  LDL - 82  HgbA1c - 5.5  VTE  prophylaxis - Lovenox Fall precautions Aspiration precautions Diet Heart Room service appropriate? Yes; Fluid consistency: Thin  aspirin 81 mg daily prior to admission, now on aspirin 81 mg daily  Patient counseled to be compliant with her antithrombotic medications  Ongoing aggressive stroke risk factor management  Therapy recommendations:  CIR recommended Disposition:  CLR Hypertension  Stable  Permissive hypertension (OK if < 220/120) but gradually normalize in 5-7 days  Long-term BP goal normotensive  Hyperlipidemia  Lipid lowering medication PTA: Lipitor 40 mg daily  LDL 82, goal < 70  Increase Lipitor to 80 mg daily  Continue statin at discharge   Other Stroke Risk Factors  ETOH use, advised to drink no more than 1 drink per day.  Obesity, Body mass index is 40.17 kg/m., recommend weight loss,  diet and exercise as appropriate   Family hx stroke (Father)  Obstructive sleep apnea, on CPAP at home  Other Active Problems  MS -followed by Dr. Epimenio Foot  Acute kidney injury - BUN -39 ; creatinine -1.40  Claustrophobia  Leukocytosis - monitor - afebrile (recent steroids)  Foul-smelling urine -> UA pending   Plan / Recommendations     loop recorder and discharge.  Transfer to inpatient rehabilitation when bed available.  Follow spinal fluid JC virus antibodies and multiple sclerosis but Dr. Epimenio Foot after discharge  D/w Dr Benjamine Mola  Stroke team will sign off. Kindly call for questions. Hospital day # 3      Delia Heady, MD Medical Director [facility] Pager: [phone] 04/16/2017 12:30 PM  To contact Stroke Continuity provider, please refer to [email or web address]. After hours, contact General Neurology

## 2017-04-16 NOTE — Progress Notes (Signed)
CSW following for discharge plan. CSW spoke with patient's daughter-in-law, Rachel Gilbert, via phone to discuss discharge plan. Rachel Gilbert said that she is able to communicate with Rachel Gilbert, the patient's son, but he isn't able to call during work because he drives for [employer] and they aren't understanding about him needing to take time away from work to help make decisions for his mom. Rachel Gilbert discussed how the patient will need SNF placement, and discussed whether or not to place patient closer to where they are in [facility]. After discussing patient's situation, Rachel Gilbert said it might be best to keep the patient in [place] for short term rehab as it's closer to her doctors; if the patient needs more support at discharge from SNF, then they can look at moving her closer to them at that time.  CSW met with patient to discuss the situation with her, and patient is agreeable to staying in [place] for SNF. CSW discussed facility options that are contracted with her insurance; patient requested that [facility] contact her friend Rachel Gilbert to discuss facility options.  CSW to follow up with sending referral and following up with patient's friend to discuss facility options.  Rachel Gilbert, [place] Clinical Social Worker [phone]

## 2017-04-16 NOTE — CV Procedure (Signed)
TEE: During this procedure the patient is administered a total of Versed 5 mg and Fentanyl 50 mg to achieve and maintain moderate conscious sedation.  The patient's heart rate, blood pressure, and oxygen saturation are monitored continuously during the procedure. The period of conscious sedation is 35 minutes, of which I was present face-to-face 100% of this time.  Moderate LVH EF 60% Normal MV,AV,TV,PV No LAA thrombus Normal RV No significant aortic debris Negative bubble study No source of embolus  Charlton Haws

## 2017-04-16 NOTE — Progress Notes (Signed)
OT Cancellation    04/16/17 0900  OT Visit Information  Last OT Received On 04/16/17  Reason Eval/Treat Not Completed Patient at procedure or test/ unavailable (endoscopy for TEE/loop)  [facility], OT/L  [phone] 04/16/2017

## 2017-04-17 ENCOUNTER — Encounter (HOSPITAL_COMMUNITY): Payer: Medicare HMO

## 2017-04-17 ENCOUNTER — Other Ambulatory Visit (HOSPITAL_COMMUNITY): Payer: Self-pay | Admitting: General Practice

## 2017-04-17 LAB — JC VIRUS, PCR CSF: JC Virus PCR, CSF: NEGATIVE

## 2017-04-17 NOTE — Care Management Note (Signed)
Case Management Note  Patient Details  Name: Rachel Gilbert MRN: [id number] Date of Birth: 1960/10/04  Subjective/Objective:    Pt admitted with CVA. She is from home alone.    PCP: Dr Marissa Calamity             Action/Plan: Recommendations are for SNF. CM following for d/c disposition.   Expected Discharge Date:  04/15/17               Expected Discharge Plan:  Skilled Nursing Facility  In-House Referral:  Clinical Social Work  Discharge planning Services     Post Acute Care Choice:    Choice offered to:     DME Arranged:    DME Agency:     HH Arranged:    HH Agency:     Status of Service:  In process, will continue to follow  If discussed at Long Length of Stay Meetings, dates discussed:    Additional Comments:  Kermit Balo, RN 04/17/2017, 10:15 AM

## 2017-04-17 NOTE — Progress Notes (Addendum)
PROGRESS NOTE    Rachel Gilbert  ZOX:[id number] DOB: 08/30/1960 DOA: 04/12/2017 PCP: Josph Macho, FNP   Outpatient Specialists:     Brief Narrative:  Rachel Gilbert is a 57 y.o. female with medical history significant of HTN, MS, OSA on CPAP, and TIA; who presents with altered mental status.   Around 8:10 a.m. On 3/13, the patient had called her best friend and was noted to have significantly slurred speech at that time over the phone.  She was also noted to have a mild right-sided facial droop.  She was taken to [facility] regional and evaluated for stroke versus MS flare.  CT scan showed decreased attenuation in the periventricular white matter adjacent to the frontal horns or demyelination versus small vessel disease could not be delineated.  Neurology was consulted and it was decided to give the patient high-dose steroids which she received for 3 days. MRI was recommended but could not be obtained.  Patient seem to be slightly improved, but speech was still somewhat slurred when she was discharged home on 3/16.  the day before re-admission, patient was found to have worsening slurred speech with inability to work on and very emotional which was unusual for her. s/p MRI and LP under sedation with MRI showing CVA.  TEE has been done--- no need for Loop but will need 30 day event monitor.       Assessment & Plan:   Principal Problem:   CVA (cerebral vascular accident) [facility]) Active Problems:   Essential hypertension   Obesity   Obstructive sleep apnea on CPAP   Multiple sclerosis (HCC)   Depression with anxiety   Dysphagia   CVA, dysphagia, apraxia:  -Seen at [facility] hospital on 3/13 for the same.  She underwent echocardiogram showing EF of 55-65%, bilateral carotid ultrasound showing less than 50% stenosis. repeat CT scan shows area of concern in the left frontal lobe in the distribution of the left MCA for subacute stroke. -s/p MRI/LP under sedation-- spinal fluid sent for  JC virus (has MS and is immunocompromised) - MRI shows CVA -hypercoaguable work up in progress- negative thus far -SNF eventually and Environmental manager. -HIV negative -cardiology consulted for TEE- no clot seen -will need 30 day event monitor instead of Loop -statin: lipitor 80 -Neurology consulted and signed off.  Indicate left MCA distribution large subcortical infarct-source unknown versus multiple sclerosis lesion?  PML given periventricular location and patient is on long-term Tysabri for her MS.  Continue aspirin 81 mg daily + Plavix 75 daily.  Outpatient follow-up with Dr. Epimenio Foot on discharge.  Acute kidney injury secondary to dehydration: Patient's creatinine previously appears to have been around 1.07 on 04/08/17 but noted to be 1.1 admission with BUN 39.   -prerenal cause of symptoms. -monitor BMP- Cr back to baseline. Acute kidney injury resolved.  Leukocytosis: Acute.  Initial WBC 16.  Patient was recently treated with 3 days of high-dose steroids for possible MS. - trending down/almost normal.  Multiple sclerosis: Patient followed by Dr. Marzetta Merino neurology in the outpatient setting and takes natalizumab. - Continue baclofen -As per patient and nursing, patient was due for her Tysabri 3/22 and were wondering if she can get it here.  I discussed with neuro hospitalist on call 3/22 who recommended that this cannot be done in the hospital or at [facility] and can only be done at accredited centers. Same relayed to CM.   Hypokalemia -replete as able.  Follow BMP in a.m.  Essential hypertension -  Continue Coreg and hydralazine - resume ACE-- monitor K and Cr closely.  Mildly uncontrolled.  Hyperlipidemia: - statin  OSA on CPAP -  CPAP QHS  Morbid obesity Body mass index is 40.17 kg/m.     DVT prophylaxis:  SCD's  Code Status: Full Code   Family Communication: None at bedside  Disposition Plan:  SNF when bed available   Consultants:   Neuro Cardiology IR  Subjective: Seen this morning.  Reports that her speech is improved but not at baseline.  Denies any asymmetrical weakness.  No pain reported.  Objective: Vitals:   04/17/17 0422 04/17/17 0852 04/17/17 1131 04/17/17 1555  BP: (!) 149/73 (!) 142/84 (!) 158/81 (!) 172/94  Pulse: 61 76 75 76  Resp: 18 20 20 20   Temp: 97.6 F (36.4 C) (!) 97.5 F (36.4 C) 97.7 F (36.5 C) (!) 97.5 F (36.4 C)  TempSrc: Axillary Oral Oral Oral  SpO2: 97% 99% 95% 97%  Weight:      Height:        Intake/Output Summary (Last 24 hours) at 04/17/2017 1615 Last data filed at 04/17/2017 1158 Gross per 24 hour  Intake 960 ml  Output -  Net 960 ml   Filed Weights   04/12/17 1947 04/13/17 0258 04/16/17 0828  Weight: 122.5 kg (270 lb) 123.6 kg (272 lb 7.8 oz) 123.4 kg (272 lb)    Examination:  General exam: Pleasant young female, moderately built and morbidly obese, lying comfortably supine in bed with CPAP on this morning which she removed. Respiratory system: Clear to auscultation.  No increased work of breathing. Cardiovascular system: S1 and S2 heard, RRR.  No JVD, murmurs or pedal edema.  Telemetry: SB in the high 50s-SR. Gastrointestinal system: Nondistended, soft and nontender.  Normal bowel sounds heard. Central nervous system: Alert and oriented.  Dysarthric speech.  No other focal neurological deficits appreciated. Psychiatric: Pleasant and appropriate.     Data Reviewed: I have personally reviewed following labs and imaging studies  CBC: Recent Labs  Lab 04/12/17 1954 04/12/17 1959 04/13/17 1104 04/14/17 0634 04/16/17 0646  WBC 16.0*  --  10.5 9.3 12.1*  NEUTROABS 11.6*  --   --   --   --   HGB 15.0 15.3* 13.7 13.4 12.9  HCT 47.8* 45.0 44.2 42.4 41.0  MCV 92.3  --  93.2 92.4 91.1  PLT 264  --  201 196 200   Basic Metabolic Panel: Recent Labs  Lab 04/12/17 1954 04/12/17 1959 04/13/17 1104 04/14/17 0634 04/16/17 0646  NA 137 143 141 141 139  K  3.6 3.6 3.2* 3.3* 3.2*  CL 106 109 110 107 108  CO2 20*  --  22 24 22   GLUCOSE 124* 112* 117* 100* 104*  BUN 39* 39* 34* 26* 19  CREATININE 1.33* 1.40* 1.14* 1.01* 0.93  CALCIUM 8.5*  --  7.9* 8.3* 8.3*   GFR: Estimated Creatinine Clearance: 96.1 mL/min (by C-G formula based on SCr of 0.93 mg/dL). Liver Function Tests: Recent Labs  Lab 04/12/17 1954  AST 22  ALT 24  ALKPHOS 70  BILITOT 0.8  PROT 6.2*  ALBUMIN 3.6   No results for input(s): LIPASE, AMYLASE in the last 168 hours. No results for input(s): AMMONIA in the last 168 hours. Coagulation Profile: Recent Labs  Lab 04/12/17 1954  INR 1.02   Cardiac Enzymes: No results for input(s): CKTOTAL, CKMB, CKMBINDEX, TROPONINI in the last 168 hours. BNP (last 3 results) No results for input(s): PROBNP in the  last 8760 hours. HbA1C: No results for input(s): HGBA1C in the last 72 hours. CBG: No results for input(s): GLUCAP in the last 168 hours. Lipid Profile: No results for input(s): CHOL, HDL, LDLCALC, TRIG, CHOLHDL, LDLDIRECT in the last 72 hours. Thyroid Function Tests: No results for input(s): TSH, T4TOTAL, FREET4, T3FREE, THYROIDAB in the last 72 hours. Anemia Panel: No results for input(s): VITAMINB12, FOLATE, FERRITIN, TIBC, IRON, RETICCTPCT in the last 72 hours. Urine analysis:    Component Value Date/Time   COLORURINE YELLOW 04/13/2017 0048   APPEARANCEUR CLEAR 04/13/2017 0048   LABSPEC 1.015 04/13/2017 0048   PHURINE 5.0 04/13/2017 0048   GLUCOSEU 50 (A) 04/13/2017 0048   HGBUR NEGATIVE 04/13/2017 0048   BILIRUBINUR NEGATIVE 04/13/2017 0048   KETONESUR NEGATIVE 04/13/2017 0048   PROTEINUR NEGATIVE 04/13/2017 0048   NITRITE NEGATIVE 04/13/2017 0048   LEUKOCYTESUR NEGATIVE 04/13/2017 0048      Recent Results (from the past 240 hour(s))  Culture, blood (Routine X 2) w Reflex to ID Panel     Status: None (Preliminary result)   Collection Time: 04/13/17  5:58 AM  Result Value Ref Range Status   Specimen  Description BLOOD LEFT HAND  Final   Special Requests   Final    BOTTLES DRAWN AEROBIC AND ANAEROBIC Blood Culture adequate volume   Culture   Final    NO GROWTH 4 DAYS Performed at [facility], [address], [place], [place] [address]    Report Status PENDING  Incomplete  Culture, blood (Routine X 2) w Reflex to ID Panel     Status: None (Preliminary result)   Collection Time: 04/13/17  6:03 AM  Result Value Ref Range Status   Specimen Description BLOOD RIGHT HAND  Final   Special Requests   Final    BOTTLES DRAWN AEROBIC AND ANAEROBIC Blood Culture results may not be optimal due to an inadequate volume of blood received in culture bottles   Culture   Final    NO GROWTH 4 DAYS Performed at [facility], [address], [place], [place] [address]    Report Status PENDING  Incomplete      Anti-infectives (From admission, onward)   None       Radiology Studies: No results found.      Scheduled Meds: . aspirin EC  81 mg Oral Daily  . atorvastatin  80 mg Oral q1800  . carvedilol  25 mg Oral BID WC  . clopidogrel  75 mg Oral Daily  . famotidine  40 mg Oral QHS  . hydrALAZINE  50 mg Oral BID  . lisinopril  40 mg Oral Daily  . oxybutynin  15 mg Oral QHS   Continuous Infusions:    LOS: 4 days    Time spent: 25 min    Marcellus Scott, MD, FACP, [facility]. [facility] Pager [phone]  If 7PM-7AM, please contact night-coverage [email or web address] Password [facility] 04/17/2017, 4:29 PM

## 2017-04-17 NOTE — Progress Notes (Signed)
Pt missed her dose of Tysabri today that she receives at the [facility]. CM spoke to Dr Waymon Amato and it can not be given in the hospital. CM reached out to patients friend Rachel Gilbert and she is happy to arrange for her to receive the dose next week at [facility] and provide the transportation to the appointment. CSW updated.

## 2017-04-17 NOTE — Clinical Social Work Note (Signed)
Pt's daughter has selected 1) [facility], 2) [facility], 3) [facility]. [facility] has declined pt at this time.  [facility] is not in network with [employer] and only take LOG on case to case basis. In the case that we were to do a LOG [facility] nor [facility] will not take a LOG for pending [employer] Auth--confirmed with hospital liason. [facility] also will not take a LOG with pending [place].   [facility] is trying to determine if pt will need infusion treatment before she transfer to facility. RNCM to let CSW know.   CSW still actively searching for placement.   [place], [place] [phone]

## 2017-04-17 NOTE — Progress Notes (Signed)
Pt placed self on bipap for the night. Tol well.

## 2017-04-17 NOTE — Care Management Important Message (Signed)
Important Message  Patient Details  Name: Rachel Gilbert MRN: [id number] Date of Birth: 03/21/1960   Medicare Important Message Given:  Yes    Dorothia Passmore Stefan Church 04/17/2017, 1:57 PM

## 2017-04-18 LAB — BASIC METABOLIC PANEL
ANION GAP: 10 (ref 5–15)
BUN: 18 mg/dL (ref 6–20)
CALCIUM: 8.5 mg/dL — AB (ref 8.9–10.3)
CHLORIDE: 106 mmol/L (ref 101–111)
CO2: 24 mmol/L (ref 22–32)
Creatinine, Ser: 1.09 mg/dL — ABNORMAL HIGH (ref 0.44–1.00)
GFR calc non Af Amer: 56 mL/min — ABNORMAL LOW (ref >60)
Glucose, Bld: 109 mg/dL — ABNORMAL HIGH (ref 65–99)
Potassium: 3.3 mmol/L — ABNORMAL LOW (ref 3.5–5.1)
SODIUM: 140 mmol/L (ref 135–145)

## 2017-04-18 LAB — CULTURE, BLOOD (ROUTINE X 2)
Culture: NO GROWTH
Culture: NO GROWTH
Special Requests: ADEQUATE

## 2017-04-18 LAB — FACTOR 5 LEIDEN

## 2017-04-18 MED ORDER — POTASSIUM CHLORIDE CRYS ER 20 MEQ PO TBCR
40.0000 meq | EXTENDED_RELEASE_TABLET | Freq: Once | ORAL | Status: AC
  Administered 2017-04-18: 40 meq via ORAL
  Filled 2017-04-18: qty 2

## 2017-04-18 NOTE — Progress Notes (Addendum)
PROGRESS NOTE   Rachel Gilbert  DGL:[id number]    DOB: [birth year]    DOA: 04/12/2017  PCP: Josph Macho, FNP   I have briefly reviewed patients previous medical records in [facility].  Brief Narrative:  57 year old female with PMH of HTN, MS, OSA on CPAP and TIA presented with altered mental status.  She was recently hospitalized at [facility] on 3/13 with aphasia, slurred speech and right-sided weakness.  She was seen by neurology who felt that patient either had a stroke or MS flare.  An MRI was attempted but she did not tolerate it due to claustrophobia.  She completed carotid Dopplers and echocardiogram.  She received 3 days of IV Solu-Medrol, improved and was discharged for outpatient follow-up. On 3/17, she was found to have worsening slurred speech, no limb weakness and presented to the ED.  Neurology was consulted and patient completed stroke workup.  At this time awaiting SNF bed for discharge.   Assessment & Plan:   Principal Problem:   CVA (cerebral vascular accident) [facility]) Active Problems:   Essential hypertension   Obesity   Obstructive sleep apnea on CPAP   Multiple sclerosis (HCC)   Depression with anxiety   Dysphagia   Stroke: Neurology was consulted and patient has completed stroke workup.  MRI brain 04/13/17: Incomplete study.  Multiple areas of both focal and confluent restricted diffusion within the left MCA territory, left frontal predominant, representing nonhemorrhagic acute infarction, corresponding with the recent CT.  Multiple white matter lesions, periventricular predominant, consistent with chronic MS.  Repeat MRI brain 3/20: Patchy early subacute left MCA infarct without extension from 04/13/17.  No new infarct.  CTA head 3/18: No emergent large vessel occlusion.  As per neurology, left MCA distribution large subcortical infarct, source unknown versus MS lesion?  PML given periventricular location and patient is on long-term Tysabri for her MS.  Resultant  expressive aphasia.  Carotid Dopplers: Less than 50% stenosis in the right and left ICA.  2D echo: LVEF 55-60% and no cardiac source of emboli identified.  TEE: No cardiac source of emboli was identified.  Transcranial Doppler studies with bubble study: Negative for PFO.  LDL 82.  A1c 5.5.  Patient was on aspirin 81 mg daily PTA (but seems to have been only started recently at [facility]), now on aspirin 81 mg + Plavix 75 mg (Neurology sign off note 3/21 mentions only aspirin.  I have consulted stroke MD, Dr. Roda Shutters 3/23 to clarify what antithrombotics patient needs to be on).  Patient underwent MRI/LP under sedation.  Spinal fluid sent for JC virus >negative.  HIV screen negative.  Hypercoagulable workup: Cardiolipin antibodies, beta-2 glycoprotein antibodies, lupus anticoagulant, protein C&S & Antithrombin III: Negative.  Prothrombin gene mutation and factor V Leyden: Pending.  Homocystine mildly elevated/16 >?  Start folic acid.  Cardiology was consulted regarding placement of implantable loop recorder due to cryptogenic stroke.  As per cardiology input 3/21, patient has profound deficits which limit her ability to interact with device clinic staff for remote monitoring and in addition she will be discharged to SNF and therefore they did not recommend implantable loop recorder at this time and advised 30-day monitor and if no arrhythmia seen on her monitor then further consideration for implantable loop recorder can be made once her stroke symptoms have improved. Prior to DC, likely to happen next week, please contact [facility] to arrange 30-day monitor at discharge.  Outpatient follow-up with Dr. Epimenio Foot, Neurology.  Therapies recommend SNF and  awaiting same at this time.    Addendum: As per Dr. Roda Shutters, Stroke MD commendations today, continue aspirin 81 mg daily + Plavix 75 mg daily for 3 weeks followed by either aspirin or Plavix alone.  Multiple sclerosis: Patient followed by Dr. Epimenio Foot, Neurology as outpatient and  takes Tysabri. As per patient and nursing, patient was due for her Tysabri 3/22 and were wondering if she can get it here.  I discussed with Neuro hospitalist on call 3/22 who recommended that this cannot be done in the hospital or at [facility] and can only be done at accredited centers. Same relayed to CM yesterday and to patient today.  As per case management, patient's friend will pick up patient from SNF and take her to infusion center for medication next week.  Essential hypertension: Allowed for permissive hypertension given recent acute stroke.  Continue carvedilol 25 mg twice daily, hydralazine 50 mg twice daily and lisinopril 40 mg daily.  Hyperlipidemia: LDL 82, goal <70.  Atorvastatin increased from 40mg  to 80 mg daily.  Morbid obesity/Body mass index is 40.17 kg/m.  Recommend weight loss.  OSA: Continue CPAP.  Acute kidney injury: Secondary to dehydration.  Resolved.  Leukocytosis: Initial WBC 16.  Possibly due to recent high-dose steroid use.  Improved.  Hypokalemia: Replace and follow.  Check magnesium.    DVT prophylaxis: SCD's Code Status: Full Family Communication: None at bedside Disposition: DC to SNF when bed available.   Consultants:  Neurology Cardiology Interventional radiology  Procedures:  As above  Antimicrobials:  None   Subjective: Patient states that she has not seen much improvement in her speech since admission.  Denies any complaints.  ROS: As above  Objective:  Vitals:   04/17/17 2035 04/18/17 0018 04/18/17 0440 04/18/17 0808  BP: (!) 161/88 (!) 153/78 (!) 153/88 (!) 165/81  Pulse: 72 70 63 73  Resp: 20 20 18 18   Temp: 97.8 F (36.6 C) 98 F (36.7 C) 98.1 F (36.7 C) 98.9 F (37.2 C)  TempSrc: Axillary Axillary Axillary Oral  SpO2: 96% 97% 96% 96%  Weight:      Height:        Examination:  General exam: Pleasant young female, moderately built and morbidly obese, lying comfortably supine in bed. Respiratory system: Clear to  auscultation. Respiratory effort normal. Cardiovascular system: S1 & S2 heard, RRR. No JVD, murmurs, rubs, gallops or clicks. No pedal edema.  Telemetry: SB in the 50s-SR. Gastrointestinal system: Abdomen is nondistended, soft and nontender. No organomegaly or masses felt. Normal bowel sounds heard. Central nervous system: Alert and oriented.  Expressive aphasia and some dysarthria.  No other focal neurological deficits. Extremities: Symmetric 5 x 5 power. Skin: No rashes, lesions or ulcers Psychiatry: Judgement and insight appear normal. Mood & affect appropriate.     Data Reviewed: I have personally reviewed following labs and imaging studies  CBC: Recent Labs  Lab 04/12/17 1954 04/12/17 1959 04/13/17 1104 04/14/17 0634 04/16/17 0646  WBC 16.0*  --  10.5 9.3 12.1*  NEUTROABS 11.6*  --   --   --   --   HGB 15.0 15.3* 13.7 13.4 12.9  HCT 47.8* 45.0 44.2 42.4 41.0  MCV 92.3  --  93.2 92.4 91.1  PLT 264  --  201 196 200   Basic Metabolic Panel: Recent Labs  Lab 04/12/17 1954 04/12/17 1959 04/13/17 1104 04/14/17 0634 04/16/17 0646 04/18/17 0425  NA 137 143 141 141 139 140  K 3.6 3.6 3.2* 3.3* 3.2* 3.3*  CL 106 109 110 107 108 106  CO2 20*  --  22 24 22 24   GLUCOSE 124* 112* 117* 100* 104* 109*  BUN 39* 39* 34* 26* 19 18  CREATININE 1.33* 1.40* 1.14* 1.01* 0.93 1.09*  CALCIUM 8.5*  --  7.9* 8.3* 8.3* 8.5*   Liver Function Tests: Recent Labs  Lab 04/12/17 1954  AST 22  ALT 24  ALKPHOS 70  BILITOT 0.8  PROT 6.2*  ALBUMIN 3.6   Coagulation Profile: Recent Labs  Lab 04/12/17 1954  INR 1.02     Recent Results (from the past 240 hour(s))  Culture, blood (Routine X 2) w Reflex to ID Panel     Status: None   Collection Time: 04/13/17  5:58 AM  Result Value Ref Range Status   Specimen Description BLOOD LEFT HAND  Final   Special Requests   Final    BOTTLES DRAWN AEROBIC AND ANAEROBIC Blood Culture adequate volume   Culture   Final    NO GROWTH 5  DAYS Performed at [facility], [address], [place], [place] [address]    Report Status 04/18/2017 FINAL  Final  Culture, blood (Routine X 2) w Reflex to ID Panel     Status: None   Collection Time: 04/13/17  6:03 AM  Result Value Ref Range Status   Specimen Description BLOOD RIGHT HAND  Final   Special Requests   Final    BOTTLES DRAWN AEROBIC AND ANAEROBIC Blood Culture results may not be optimal due to an inadequate volume of blood received in culture bottles   Culture   Final    NO GROWTH 5 DAYS Performed at [facility], [address] [place], [place] [address]    Report Status 04/18/2017 FINAL  Final         Radiology Studies: No results found.      Scheduled Meds: . aspirin EC  81 mg Oral Daily  . atorvastatin  80 mg Oral q1800  . carvedilol  25 mg Oral BID WC  . clopidogrel  75 mg Oral Daily  . famotidine  40 mg Oral QHS  . hydrALAZINE  50 mg Oral BID  . lisinopril  40 mg Oral Daily  . oxybutynin  15 mg Oral QHS   Continuous Infusions:   LOS: 5 days     Marcellus Scott, MD, FACP, [facility]. [facility] Pager [phone]  If 7PM-7AM, please contact night-coverage [email or web address] Password [facility] 04/18/2017, 1:38 PM

## 2017-04-19 LAB — BASIC METABOLIC PANEL
Anion gap: 8 (ref 5–15)
BUN: 16 mg/dL (ref 6–20)
CALCIUM: 9 mg/dL (ref 8.9–10.3)
CHLORIDE: 109 mmol/L (ref 101–111)
CO2: 25 mmol/L (ref 22–32)
CREATININE: 1.16 mg/dL — AB (ref 0.44–1.00)
GFR calc non Af Amer: 52 mL/min — ABNORMAL LOW (ref >60)
Glucose, Bld: 107 mg/dL — ABNORMAL HIGH (ref 65–99)
Potassium: 3.7 mmol/L (ref 3.5–5.1)
SODIUM: 142 mmol/L (ref 135–145)

## 2017-04-19 LAB — PHOSPHORUS: Phosphorus: 3.9 mg/dL (ref 2.5–4.6)

## 2017-04-19 LAB — MAGNESIUM: Magnesium: 2 mg/dL (ref 1.7–2.4)

## 2017-04-19 MED ORDER — POTASSIUM CHLORIDE CRYS ER 20 MEQ PO TBCR
20.0000 meq | EXTENDED_RELEASE_TABLET | Freq: Once | ORAL | Status: AC
  Administered 2017-04-19: 20 meq via ORAL
  Filled 2017-04-19: qty 1

## 2017-04-19 NOTE — Discharge Summary (Signed)
[employer] [facility]   PATIENT NAME: Rachel Gilbert    MR#:  [id number]  DATE OF BIRTH:  10-28-1960  DATE OF ADMISSION:  04/08/2017 ADMITTING PHYSICIAN: Ihor Austin, MD  DATE OF DISCHARGE: 04/11/2017 11:14 AM  PRIMARY CARE PHYSICIAN: Josph Macho, FNP   ADMISSION DIAGNOSIS:  Cerebrovascular accident (CVA), unspecified mechanism (HCC) [I63.9]  DISCHARGE DIAGNOSIS:  Active Problems:   Multiple sclerosis exacerbation (HCC)   TIA (transient ischemic attack)   SECONDARY DIAGNOSIS:   Past Medical History:  Diagnosis Date  . Complication of anesthesia    hard time waking up   . Hearing loss   . Hypertension   . Kidney stones   . Multiple sclerosis (HCC)   . Sleep apnea    Uses CPAP  . Vision abnormalities      ADMITTING HISTORY  HISTORY OF PRESENT ILLNESS: Rachel Gilbert  is a 57 y.o. female with a known history of multiple sclerosis, sleep apnea uses CPAP at bedtime, hypertension presented to the emergency room because of slurred speech and facial droop.  Patient got up this morning around 6 AM noticed facial droop on the right side and also had trouble with speech.  She had trouble expressing words and also had slurred speech she called her caregiver around 8:10 AM EMS was called and she came to the emergency room.[name] Her facial droop has resolved by the time she arrived to emergency room.  Her speech also has improved.  No tingling and numbness in any part of the body.  No weakness in any part of the body.  She gets IV infusion therapy at [facility] for multiple sclerosis once a month.  CT head was done during the workup in the emergency room which showed no acute abnormality.  Patient was given oral aspirin in the emergency room and hospitalist service was consulted.    HOSPITAL COURSE:   1. Facial droop with dysarthria can be from acute infarct versus MS exacerbation Started patient on oral aspirin  LDL 82, Lipitor Echocardiogram  with ejection fraction 55 to 65% no cardiac source of emboli noticed. Patient is refusing MRI in view of severe claustrophobia.  She does not want to get the MRI even with benzodiazepines prior to MRI. Neurology is recommending outpatient MRI-open after discharge,Patient to follow up with Dr. Epimenio Foot Carotid Dopplers with less than 50% ICA stenosis  Physical therapy -no PT /OT needs identified   2.Multiple sclerosis-possible exacerbation Patient treated with Solu-Medrol thousand milligrams daily for 3 days. Follow-up with neurology at [facility]  3.Hypertension-blood pressure is elevated, could befrom stroke versus steroid-induced Resume beta-blocker and ACE inhibitor for hypertension  Patient stable for discharge home to follow-up with neurology as outpatient in the office and also outpatient open MRI.  Advised to return if any worsening of symptoms.     CONSULTS OBTAINED:  Treatment Team:  Thana Farr, MD  DRUG ALLERGIES:   Allergies  Allergen Reactions  . Oxycodone Anaphylaxis    DISCHARGE MEDICATIONS:   Allergies as of 04/11/2017      Reactions   Oxycodone Anaphylaxis      Medication List    TAKE these medications   acetaminophen 500 MG tablet Commonly known as:  TYLENOL Take 500 mg by mouth every 6 (six) hours as needed for mild pain.   ALPRAZolam 0.5 MG tablet Commonly known as:  XANAX Take 0.5 mg by mouth 2 (two) times daily as needed for anxiety.   aspirin 81 MG EC  tablet Take 1 tablet (81 mg total) by mouth daily.   atorvastatin 40 MG tablet Commonly known as:  LIPITOR Take 1 tablet (40 mg total) by mouth daily at 6 PM.   baclofen 10 MG tablet Commonly known as:  LIORESAL Take 1 tablet (10 mg total) by mouth 3 (three) times daily as needed for muscle spasms.   carvedilol 25 MG tablet Commonly known as:  COREG Take 25 mg by mouth 2 (two) times daily with a meal.   FLUoxetine 40 MG capsule Commonly known as:  PROZAC Take 1 capsule (40  mg total) by mouth daily.   hydrALAZINE 50 MG tablet Commonly known as:  APRESOLINE Take 50 mg by mouth 2 (two) times daily.   ibuprofen 200 MG tablet Commonly known as:  ADVIL,MOTRIN Take 200 mg by mouth every 6 (six) hours as needed for moderate pain.   lisinopril 40 MG tablet Commonly known as:  PRINIVIL,ZESTRIL Take 40 mg by mouth daily.   methylphenidate 10 MG tablet Commonly known as:  RITALIN Take 2 pills qAM and one pill po 4 hours later   natalizumab 300 MG/15ML injection Commonly known as:  TYSABRI Inject 15 mLs into the vein every 28 (twenty-eight) days.   oxybutynin 15 MG 24 hr tablet Commonly known as:  DITROPAN XL Take 1 tablet (15 mg total) by mouth at bedtime.   ranitidine 300 MG tablet Commonly known as:  ZANTAC Take 300 mg by mouth every evening.   spironolactone 50 MG tablet Commonly known as:  ALDACTONE Take 50 mg by mouth daily.   Vitamin D3 5000 units Caps Take 5,000 Units by mouth daily.       Today   VITAL SIGNS:  Blood pressure (!) 158/95, pulse 78, temperature 97.8 F (36.6 C), temperature source Oral, resp. rate 20, height 5\' 9"  (1.753 m), weight 124.7 kg (275 lb), last menstrual period 01/15/2015, SpO2 95 %.  I/O:  No intake or output data in the 24 hours ending 04/19/17 1355  PHYSICAL EXAMINATION:  Physical Exam  GENERAL:  57 y.o.-year-old patient lying in the bed with no acute distress.  LUNGS: Normal breath sounds bilaterally, no wheezing, rales,rhonchi or crepitation. No use of accessory muscles of respiration.  CARDIOVASCULAR: S1, S2 normal. No murmurs, rubs, or gallops.  ABDOMEN: Soft, non-tender, non-distended. Bowel sounds present. No organomegaly or mass.  NEUROLOGIC: Moves all 4 extremities. PSYCHIATRIC: The patient is alert and oriented x 3.  SKIN: No obvious rash, lesion, or ulcer.   DATA REVIEW:   CBC Recent Labs  Lab 04/16/17 0646  WBC 12.1*  HGB 12.9  HCT 41.0  PLT 200    Chemistries  Recent Labs  Lab  04/12/17 1954  04/19/17 0540  NA 137   < > 142  K 3.6   < > 3.7  CL 106   < > 109  CO2 20*   < > 25  GLUCOSE 124*   < > 107*  BUN 39*   < > 16  CREATININE 1.33*   < > 1.16*  CALCIUM 8.5*   < > 9.0  AST 22  --   --   ALT 24  --   --   ALKPHOS 70  --   --   BILITOT 0.8  --   --    < > = values in this interval not displayed.    Cardiac Enzymes No results for input(s): TROPONINI in the last 168 hours.  Microbiology Results  No results found for  this or any previous visit.  RADIOLOGY:  No results found.  Follow up with PCP in 1 week.  Management plans discussed with the patient, family and they are in agreement.  CODE STATUS:  Code Status History    Date Active Date Inactive Code Status Order ID Comments User Context   04/08/2017 1144 04/11/2017 1416 Full Code [id number]  Ihor Austin, MD Inpatient   06/04/2014 0313 06/07/2014 1338 Full Code [id number]  Hower, Cletis Athens, MD ED      TOTAL TIME TAKING CARE OF THIS PATIENT ON DAY OF DISCHARGE: more than 30 minutes.   Molinda Bailiff Mikisha Roseland M.D on 04/19/2017 at 1:55 PM  Between 7am to 6pm - Pager - [phone]  After 6pm go to [email or web address] - password [email or web address] [facility]  [facility] [place] [facility]  Office  [phone]  CC: Primary care physician; Josph Macho, FNP  Note: This dictation was prepared with Dragon dictation along with smaller phrase technology. Any transcriptional errors that result from this process are unintentional.

## 2017-04-19 NOTE — Progress Notes (Signed)
PROGRESS NOTE   Rachel Gilbert  RUE:[id number]    DOB: [birth year]    DOA: 04/12/2017  PCP: Josph Macho, FNP   I have briefly reviewed patients previous medical records in [facility].  Brief Narrative:  57 year old female with PMH of HTN, MS, OSA on CPAP and TIA presented with altered mental status.  She was recently hospitalized at [facility] on 3/13 with aphasia, slurred speech and right-sided weakness.  She was seen by neurology who felt that patient either had a stroke or MS flare.  An MRI was attempted but she did not tolerate it due to claustrophobia.  She completed carotid Dopplers and echocardiogram.  She received 3 days of IV Solu-Medrol, improved and was discharged for outpatient follow-up. On 3/17, she was found to have worsening slurred speech, no limb weakness and presented to the ED.  Neurology was consulted and patient completed stroke workup.  At this time awaiting SNF bed for discharge.  04/19/2017: Patient seen.  Patient seems emotional, but denies depression.  No new complaints.  Patient is asking to have a bath.  Has significant history from the patient.  Assessment & Plan:   Principal Problem:   CVA (cerebral vascular accident) [facility]) Active Problems:   Essential hypertension   Obesity   Obstructive sleep apnea on CPAP   Multiple sclerosis (HCC)   Depression with anxiety   Dysphagia   Stroke: Neurology was consulted and patient has completed stroke workup.  MRI brain 04/13/17: Incomplete study.  Multiple areas of both focal and confluent restricted diffusion within the left MCA territory, left frontal predominant, representing nonhemorrhagic acute infarction, corresponding with the recent CT.  Multiple white matter lesions, periventricular predominant, consistent with chronic MS.  Repeat MRI brain 3/20: Patchy early subacute left MCA infarct without extension from 04/13/17.  No new infarct.  CTA head 3/18: No emergent large vessel occlusion.  As per neurology, left MCA  distribution large subcortical infarct, source unknown versus MS lesion?  PML given periventricular location and patient is on long-term Tysabri for her MS.  Resultant expressive aphasia.  Carotid Dopplers: Less than 50% stenosis in the right and left ICA.  2D echo: LVEF 55-60% and no cardiac source of emboli identified.  TEE: No cardiac source of emboli was identified.  Transcranial Doppler studies with bubble study: Negative for PFO.  LDL 82.  A1c 5.5.  Patient was on aspirin 81 mg daily PTA (but seems to have been only started recently at [facility]), now on aspirin 81 mg + Plavix 75 mg (Neurology sign off note 3/21 mentions only aspirin.  I have consulted stroke MD, Dr. Roda Shutters 3/23 to clarify what antithrombotics patient needs to be on).  Patient underwent MRI/LP under sedation.  Spinal fluid sent for JC virus >negative.  HIV screen negative.  Hypercoagulable workup: Cardiolipin antibodies, beta-2 glycoprotein antibodies, lupus anticoagulant, protein C&S & Antithrombin III: Negative.  Prothrombin gene mutation and factor V Leyden: Pending.  Homocystine mildly elevated/16 >?  Start folic acid.  Cardiology was consulted regarding placement of implantable loop recorder due to cryptogenic stroke.  As per cardiology input 3/21, patient has profound deficits which limit her ability to interact with device clinic staff for remote monitoring and in addition she will be discharged to SNF and therefore they did not recommend implantable loop recorder at this time and advised 30-day monitor and if no arrhythmia seen on her monitor then further consideration for implantable loop recorder can be made once her stroke symptoms have improved. Prior to  DC, likely to happen next week, please contact [facility] to arrange 30-day monitor at discharge.  Outpatient follow-up with Dr. Epimenio Foot, Neurology.  Therapies recommend SNF and awaiting same at this time.    Addendum: As per Dr. Roda Shutters, Stroke MD commendations today, continue aspirin 81  mg daily + Plavix 75 mg daily for 3 weeks followed by either aspirin or Plavix alone.  04/19/2017: Pursue disposition when a bed is available.  Multiple sclerosis: Patient followed by Dr. Epimenio Foot, Neurology as outpatient and takes Tysabri. As per patient and nursing, patient was due for her Tysabri 3/22 and were wondering if she can get it here.  I discussed with Neuro hospitalist on call 3/22 who recommended that this cannot be done in the hospital or at [facility] and can only be done at accredited centers. Same relayed to CM yesterday and to patient today.  As per case management, patient's friend will pick up patient from SNF and take her to infusion center for medication next week.  Essential hypertension: Allowed for permissive hypertension given recent acute stroke.  Continue carvedilol 25 mg twice daily, hydralazine 50 mg twice daily and lisinopril 40 mg daily. 04/19/2017: Patient has been in the hospital since 04/12/2017.  Will continue to optimize patient's blood pressure at this stage.  Hyperlipidemia: LDL 82, goal <70.  Atorvastatin increased from 40mg  to 80 mg daily.  Morbid obesity/Body mass index is 40.17 kg/m.  Recommend weight loss.  OSA: Continue CPAP.  Acute kidney injury: Secondary to dehydration.  Resolved.  Leukocytosis: Initial WBC 16.  Possibly due to recent high-dose steroid use.  Improved. 04/19/2017: Leukocytosis is slowly resolving.  Will check CBC in the morning.  Hypokalemia: Replace and follow.  Check magnesium. 04/19/2017: The potassium is 3.7 today.  We will give patient K-Dur 17 M EQ p.o. x1 dose. We will continue to monitor renal function and electrolytes.    DVT prophylaxis: SCD's Code Status: Full Family Communication: None at bedside Disposition: DC to SNF when bed available.   Consultants:  Neurology Cardiology Interventional radiology  Procedures:  As above  Antimicrobials:  None   Subjective: Patient states that she has not seen much  improvement in her speech since admission.  Denies any complaints.  ROS: As above  Objective:  Vitals:   04/18/17 1420 04/18/17 2017 04/18/17 2335 04/19/17 0439  BP: (!) 142/68 (!) 164/86 (!) 161/93 (!) 147/77  Pulse: 80 68 68 65  Resp: 18 18 18 18   Temp: 98.6 F (37 C) 99 F (37.2 C) 98.2 F (36.8 C) 97.8 F (36.6 C)  TempSrc: Oral Axillary Axillary Oral  SpO2: 98% 100% 100% 97%  Weight:      Height:        Examination:  General exam: Pleasant young female, moderately built and morbidly obese, lying comfortably supine in bed. Respiratory system: Clear to auscultation. Respiratory effort normal. Cardiovascular system: S1 & S2 heard, RRR. No JVD, murmurs, rubs, gallops or clicks. No pedal edema.  Telemetry: SB in the 50s-SR. Gastrointestinal system: Abdomen is nondistended, soft and nontender. No organomegaly or masses felt. Normal bowel sounds heard. Central nervous system: Alert and oriented.  Expressive aphasia and some dysarthria.  No other focal neurological deficits. Extremities: Symmetric 5 x 5 power. Skin: No rashes, lesions or ulcers Psychiatry: Judgement and insight appear normal. Mood & affect appropriate.     Data Reviewed: I have personally reviewed following labs and imaging studies  CBC: Recent Labs  Lab 04/12/17 1954 04/12/17 1959 04/13/17 1104  04/14/17 0634 04/16/17 0646  WBC 16.0*  --  10.5 9.3 12.1*  NEUTROABS 11.6*  --   --   --   --   HGB 15.0 15.3* 13.7 13.4 12.9  HCT 47.8* 45.0 44.2 42.4 41.0  MCV 92.3  --  93.2 92.4 91.1  PLT 264  --  201 196 200   Basic Metabolic Panel: Recent Labs  Lab 04/13/17 1104 04/14/17 0634 04/16/17 0646 04/18/17 0425 04/19/17 0540  NA 141 141 139 140 142  K 3.2* 3.3* 3.2* 3.3* 3.7  CL 110 107 108 106 109  CO2 22 24 22 24 25   GLUCOSE 117* 100* 104* 109* 107*  BUN 34* 26* 19 18 16   CREATININE 1.14* 1.01* 0.93 1.09* 1.16*  CALCIUM 7.9* 8.3* 8.3* 8.5* 9.0   Liver Function Tests: Recent Labs  Lab  04/12/17 1954  AST 22  ALT 24  ALKPHOS 70  BILITOT 0.8  PROT 6.2*  ALBUMIN 3.6   Coagulation Profile: Recent Labs  Lab 04/12/17 1954  INR 1.02     Recent Results (from the past 240 hour(s))  Culture, blood (Routine X 2) w Reflex to ID Panel     Status: None   Collection Time: 04/13/17  5:58 AM  Result Value Ref Range Status   Specimen Description BLOOD LEFT HAND  Final   Special Requests   Final    BOTTLES DRAWN AEROBIC AND ANAEROBIC Blood Culture adequate volume   Culture   Final    NO GROWTH 5 DAYS Performed at [facility], [address], [place], [place] [address]    Report Status 04/18/2017 FINAL  Final  Culture, blood (Routine X 2) w Reflex to ID Panel     Status: None   Collection Time: 04/13/17  6:03 AM  Result Value Ref Range Status   Specimen Description BLOOD RIGHT HAND  Final   Special Requests   Final    BOTTLES DRAWN AEROBIC AND ANAEROBIC Blood Culture results may not be optimal due to an inadequate volume of blood received in culture bottles   Culture   Final    NO GROWTH 5 DAYS Performed at [facility], [address], [place], [place] [address]    Report Status 04/18/2017 FINAL  Final         Radiology Studies: No results found.      Scheduled Meds: . aspirin EC  81 mg Oral Daily  . atorvastatin  80 mg Oral q1800  . carvedilol  25 mg Oral BID WC  . clopidogrel  75 mg Oral Daily  . famotidine  40 mg Oral QHS  . hydrALAZINE  50 mg Oral BID  . lisinopril  40 mg Oral Daily  . oxybutynin  15 mg Oral QHS   Continuous Infusions:   LOS: 6 days     Barnetta Chapel, MD, FACP, [facility]. [facility] Pager [phone]  If 7PM-7AM, please contact night-coverage [email or web address] Password [facility] 04/19/2017, 10:18 AM

## 2017-04-20 LAB — PROTHROMBIN GENE MUTATION

## 2017-04-20 MED ORDER — METOPROLOL TARTRATE 5 MG/5ML IV SOLN
5.0000 mg | Freq: Once | INTRAVENOUS | Status: DC
  Filled 2017-04-20: qty 5

## 2017-04-20 NOTE — Progress Notes (Signed)
PROGRESS NOTE   Rachel Gilbert  WGN:[id number]    DOB: 1960-05-25    DOA: 04/12/2017  PCP: Josph Macho, FNP   I have briefly reviewed patients previous medical records in [facility].  Brief Narrative:  57 year old female with PMH of HTN, MS, OSA on CPAP and TIA presented with altered mental status.  She was recently hospitalized at [facility] on 3/13 with aphasia, slurred speech and right-sided weakness.  She was seen by neurology who felt that patient either had a stroke or MS flare.  An MRI was attempted but she did not tolerate it due to claustrophobia.  She completed carotid Dopplers and echocardiogram.  She received 3 days of IV Solu-Medrol, improved and was discharged for outpatient follow-up. On 3/17, she was found to have worsening slurred speech, no limb weakness and presented to the ED.  Neurology was consulted and patient completed stroke workup.  At this time awaiting SNF bed for discharge.  04/20/2017: Patient seen alongside patient's best friend in care and physical therapist.  Patient continues to improve slowly.  No new complaints today.  Awaiting discharge to a skilled nursing facility.  Assessment & Plan:   Principal Problem:   CVA (cerebral vascular accident) [facility]) Active Problems:   Essential hypertension   Obesity   Obstructive sleep apnea on CPAP   Multiple sclerosis (HCC)   Depression with anxiety   Dysphagia   Stroke: Neurology was consulted and patient has completed stroke workup.  MRI brain 04/13/17: Incomplete study.  Multiple areas of both focal and confluent restricted diffusion within the left MCA territory, left frontal predominant, representing nonhemorrhagic acute infarction, corresponding with the recent CT.  Multiple white matter lesions, periventricular predominant, consistent with chronic MS.  Repeat MRI brain 3/20: Patchy early subacute left MCA infarct without extension from 04/13/17.  No new infarct.  CTA head 3/18: No emergent large vessel occlusion.   As per neurology, left MCA distribution large subcortical infarct, source unknown versus MS lesion?  PML given periventricular location and patient is on long-term Tysabri for her MS.  Resultant expressive aphasia.  Carotid Dopplers: Less than 50% stenosis in the right and left ICA.  2D echo: LVEF 55-60% and no cardiac source of emboli identified.  TEE: No cardiac source of emboli was identified.  Transcranial Doppler studies with bubble study: Negative for PFO.  LDL 82.  A1c 5.5.  Patient was on aspirin 81 mg daily PTA (but seems to have been only started recently at [facility]), now on aspirin 81 mg + Plavix 75 mg (Neurology sign off note 3/21 mentions only aspirin.  I have consulted stroke MD, Dr. Roda Shutters 3/23 to clarify what antithrombotics patient needs to be on).  Patient underwent MRI/LP under sedation.  Spinal fluid sent for JC virus >negative.  HIV screen negative.  Hypercoagulable workup: Cardiolipin antibodies, beta-2 glycoprotein antibodies, lupus anticoagulant, protein C&S & Antithrombin III: Negative.  Prothrombin gene mutation and factor V Leyden: Pending.  Homocystine mildly elevated/16 >?  Start folic acid.  Cardiology was consulted regarding placement of implantable loop recorder due to cryptogenic stroke.  As per cardiology input 3/21, patient has profound deficits which limit her ability to interact with device clinic staff for remote monitoring and in addition she will be discharged to SNF and therefore they did not recommend implantable loop recorder at this time and advised 30-day monitor and if no arrhythmia seen on her monitor then further consideration for implantable loop recorder can be made once her stroke symptoms have improved.  Prior to DC, likely to happen next week, please contact [facility] to arrange 30-day monitor at discharge.  Outpatient follow-up with Dr. Epimenio Foot, Neurology.  Therapies recommend SNF and awaiting same at this time.    Addendum: As per Dr. Roda Shutters, Stroke MD commendations  today, continue aspirin 81 mg daily + Plavix 75 mg daily for 3 weeks followed by either aspirin or Plavix alone.  04/20/2017: Pursue disposition when a bed is available.  Multiple sclerosis: Patient followed by Dr. Epimenio Foot, Neurology as outpatient and takes Tysabri. As per patient and nursing, patient was due for her Tysabri 3/22 and were wondering if she can get it here.  I discussed with Neuro hospitalist on call 3/22 who recommended that this cannot be done in the hospital or at [facility] and can only be done at accredited centers. Same relayed to CM yesterday and to patient today.  As per case management, patient's friend will pick up patient from SNF and take her to infusion center for medication next week.  Essential hypertension: Allowed for permissive hypertension given recent acute stroke.  Continue carvedilol 25 mg twice daily, hydralazine 50 mg twice daily and lisinopril 40 mg daily. 04/19/2017: Patient has been in the hospital since 04/12/2017.  Will continue to optimize patient's blood pressure at this stage.  Hyperlipidemia: LDL 82, goal <70.  Atorvastatin increased from 40mg  to 80 mg daily.  Morbid obesity/Body mass index is 40.17 kg/m.  Recommend weight loss.  OSA: Continue CPAP.  Acute kidney injury: Secondary to dehydration.  Resolved.  Leukocytosis: Initial WBC 16.  Possibly due to recent high-dose steroid use.  Improved. 04/19/2017: Leukocytosis is slowly resolving.  Will check CBC in the morning.  Hypokalemia: Replace and follow.  Check magnesium. 04/19/2017: The potassium is 3.7 today.  We will give patient K-Dur 50 M EQ p.o. x1 dose. We will continue to monitor renal function and electrolytes.    DVT prophylaxis: SCD's Code Status: Full Family Communication: None at bedside Disposition: DC to SNF when bed available.   Consultants:  Neurology Cardiology Interventional radiology  Procedures:  As above  Antimicrobials:  None   Subjective: Patient has no new  complaints.  The speech is improving gradually.  Physical therapy input is appreciated.  ROS: As above  Objective:  Vitals:   04/19/17 2154 04/20/17 0041 04/20/17 0523 04/20/17 0810  BP:  137/64 (S) (!) 182/96 (!) 194/104  Pulse: 64 66 70 71  Resp:  18 18 18   Temp:  98 F (36.7 C) 98.5 F (36.9 C) 98.3 F (36.8 C)  TempSrc:  Axillary Axillary Oral  SpO2:  96% 96% 96%  Weight:      Height:        Examination:  General exam: Pleasant young female, moderately built and morbidly obese, lying comfortably supine in bed. Respiratory system: Clear to auscultation. Respiratory effort normal. Cardiovascular system: S1 & S2 heard, RRR. No JVD, murmurs, rubs, gallops or clicks. No pedal edema.  Telemetry: SB in the 50s-SR. Gastrointestinal system: Abdomen is nondistended, soft and nontender. No organomegaly or masses felt. Normal bowel sounds heard. Central nervous system: Alert and oriented.  Expressive aphasia and some dysarthria.  No other focal neurological deficits. Extremities: Symmetric 5 x 5 power. Skin: No rashes, lesions or ulcers Psychiatry: Judgement and insight appear normal. Mood & affect appropriate.     Data Reviewed: I have personally reviewed following labs and imaging studies  CBC: Recent Labs  Lab 04/14/17 0634 04/16/17 0646  WBC 9.3 12.1*  HGB 13.4 12.9  HCT 42.4 41.0  MCV 92.4 91.1  PLT 196 200   Basic Metabolic Panel: Recent Labs  Lab 04/14/17 0634 04/16/17 0646 04/18/17 0425 04/19/17 0540  NA 141 139 140 142  K 3.3* 3.2* 3.3* 3.7  CL 107 108 106 109  CO2 24 22 24 25   GLUCOSE 100* 104* 109* 107*  BUN 26* 19 18 16   CREATININE 1.01* 0.93 1.09* 1.16*  CALCIUM 8.3* 8.3* 8.5* 9.0  MG  --   --   --  2.0  PHOS  --   --   --  3.9   Liver Function Tests: No results for input(s): AST, ALT, ALKPHOS, BILITOT, PROT, ALBUMIN in the last 168 hours. Coagulation Profile: No results for input(s): INR, PROTIME in the last 168 hours.   Recent Results  (from the past 240 hour(s))  Culture, blood (Routine X 2) w Reflex to ID Panel     Status: None   Collection Time: 04/13/17  5:58 AM  Result Value Ref Range Status   Specimen Description BLOOD LEFT HAND  Final   Special Requests   Final    BOTTLES DRAWN AEROBIC AND ANAEROBIC Blood Culture adequate volume   Culture   Final    NO GROWTH 5 DAYS Performed at [facility], [address], [place], [place] [address]    Report Status 04/18/2017 FINAL  Final  Culture, blood (Routine X 2) w Reflex to ID Panel     Status: None   Collection Time: 04/13/17  6:03 AM  Result Value Ref Range Status   Specimen Description BLOOD RIGHT HAND  Final   Special Requests   Final    BOTTLES DRAWN AEROBIC AND ANAEROBIC Blood Culture results may not be optimal due to an inadequate volume of blood received in culture bottles   Culture   Final    NO GROWTH 5 DAYS Performed at [facility], [address] [place], [place] [address]    Report Status 04/18/2017 FINAL  Final         Radiology Studies: No results found.      Scheduled Meds: . aspirin EC  81 mg Oral Daily  . atorvastatin  80 mg Oral q1800  . carvedilol  25 mg Oral BID WC  . clopidogrel  75 mg Oral Daily  . famotidine  40 mg Oral QHS  . hydrALAZINE  50 mg Oral BID  . lisinopril  40 mg Oral Daily  . metoprolol tartrate  5 mg Intravenous Once  . oxybutynin  15 mg Oral QHS   Continuous Infusions:   LOS: 7 days     Barnetta Chapel, MD, FACP, [facility]. [facility] Pager [phone]  If 7PM-7AM, please contact night-coverage [email or web address] Password [email or web address] 04/20/2017, 11:40 AM

## 2017-04-20 NOTE — Progress Notes (Signed)
BP 182/96, HR 70, no BP PRN meds on MAR, no BP limits read in Progress Note.  Message [facility], Bruna Potter MD.

## 2017-04-20 NOTE — Progress Notes (Addendum)
Pt ambulated to bathroom and sat on bed, appeared mad and when I asked if I could do anything for her as she appeared visably mad and angry, almost livid from her facial expression.  She sat there, looked at me intently, not blinking, similar to starring at me and said nothing.  I repeatedly asked if perhaps if she had asked for help nad no one responded, asked if someone hurt her, if she were in pain, if she needed something to eat or drink, if she need help, pt stared at me sternly and said nothing.  She laid in bed, I reached for the blankets to cover her and she pushed my hand away.  Demeanor has changed drastically since I was last in room to help up to the bathroom.  Told patient I will come back to check on her in a little while to see if she will talk to me then.

## 2017-04-20 NOTE — Progress Notes (Signed)
Physical Therapy Treatment Patient Details Name: Rachel Gilbert MRN: [id number] DOB: [birth year] Today's Date: 04/20/2017    History of Present Illness  Rachel Gilbert is a 57 y.o. female with medical history significant of HTN, MS, HOH, OSA on CPAP, and TIA; who presents with AMS, slurred speech, wording finding. Brain MRI-Multiple areas of both focal and confluent restricted diffusion in the left MCA and left frontal lobe representing acute infarct. s/p TEE and loop recorder placement.     PT Comments    Patient progressing well towards PT goals. Continues to have difficulty with expressive aphasia, word finding and dual tasking. Practiced naming tasks during mobility and pt needed multiple standing rest breaks and cues to complete with increased time. Easily frustrated. Balance seems improved from prior session but still fatigues and demonstrates mild instability esp when challenged cognitively. Lengthy discussion with pt and friend about ways to help with speech/multitasking- listening to music, singing, reading books, and allowing for increased time to express needs. Eager to get to rehab. Will follow.   Follow Up Recommendations  SNF;Supervision/Assistance - 24 hour     Equipment Recommendations  None recommended by PT    Recommendations for Other Services       Precautions / Restrictions Precautions Precautions: Fall Restrictions Weight Bearing Restrictions: No    Mobility  Bed Mobility               General bed mobility comments: Sitting EOB upon PT arrival.   Transfers Overall transfer level: Needs assistance Equipment used: None Transfers: Sit to/from Stand Sit to Stand: Supervision         General transfer comment: Supervision for safety. Stood from EOB without assist.   Ambulation/Gait Ambulation/Gait assistance: Hydrographic surveyor (Feet): 120 Feet Assistive device: None Gait Pattern/deviations: Step-through pattern;Decreased stride  length Gait velocity: 1.2 ft/sec Gait velocity interpretation: <1.8 ft/sec, indicative of risk for recurrent falls General Gait Details: Slow, mildly unsteady gait reaching for rail for support as needed, 2-3 standing rest breaks during dual tasking. Performing cognitive tasks during mobility with much difficulty.    Stairs            Wheelchair Mobility    Modified Rankin (Stroke Patients Only) Modified Rankin (Stroke Patients Only) Pre-Morbid Rankin Score: No symptoms Modified Rankin: Moderately severe disability     Balance Overall balance assessment: Needs assistance Sitting-balance support: Feet supported;No upper extremity supported Sitting balance-Leahy Scale: Good     Standing balance support: During functional activity Standing balance-Leahy Scale: Fair                              Cognition Arousal/Alertness: Awake/alert Behavior During Therapy: Flat affect Overall Cognitive Status: Impaired/Different from baseline Area of Impairment: Awareness;Problem solving                       Following Commands: Follows multi-step commands consistently   Awareness: Emergent Problem Solving: Slow processing;Requires verbal cues General Comments: Expressive difficulties. Sometimes needs repetition of tasks to perform due to [facility]. Practiced dual tasking during mobility- needed standing rest breaks to perform naming tasks. Not able to name desserts easily during walking but once seated, much easier time.       Exercises      General Comments General comments (skin integrity, edema, etc.): Friend present during session.      Pertinent Vitals/Pain Pain Assessment: No/denies pain    Home Living  Prior Function            PT Goals (current goals can now be found in the care plan section) Progress towards PT goals: Progressing toward goals    Frequency    Min 3X/week      PT Plan Current plan remains  appropriate    Co-evaluation              AM-PAC PT "6 Clicks" Daily Activity  Outcome Measure  Difficulty turning over in bed (including adjusting bedclothes, sheets and blankets)?: None Difficulty moving from lying on back to sitting on the side of the bed? : None Difficulty sitting down on and standing up from a chair with arms (e.g., wheelchair, bedside commode, etc,.)?: None Help needed moving to and from a bed to chair (including a wheelchair)?: None Help needed walking in hospital room?: A Little Help needed climbing 3-5 steps with a railing? : A Little 6 Click Score: 22    End of Session Equipment Utilized During Treatment: Gait belt Activity Tolerance: Patient tolerated treatment well Patient left: in bed;with call bell/phone within reach;with family/visitor present(sitting EOB.) Nurse Communication: Mobility status PT Visit Diagnosis: Other abnormalities of gait and mobility (R26.89);Muscle weakness (generalized) (M62.81);Hemiplegia and hemiparesis;Difficulty in walking, not elsewhere classified (R26.2) Hemiplegia - Right/Left: Right Hemiplegia - dominant/non-dominant: Dominant Hemiplegia - caused by: Cerebral infarction     Time: 1017-5102 PT Time Calculation (min) (ACUTE ONLY): 25 min  Charges:  $Neuromuscular Re-education: 8-22 mins $Self Care/Home Management: 15-Oct-2022                    G Codes:       [name], PT, DPT [phone]     Rachel Gilbert 04/20/2017, 12:11 PM

## 2017-04-20 NOTE — Progress Notes (Signed)
Pt placed self on CPAP for the night/ Tol well.

## 2017-04-20 NOTE — Care Management Note (Signed)
Case Management Note  Patient Details  Name: Rachel Gilbert MRN: [id number] Date of Birth: Jan 18, 1961  Subjective/Objective:                    Action/Plan: Plan is for SNF. CM following.  Expected Discharge Date:  04/15/17               Expected Discharge Plan:  Skilled Nursing Facility  In-House Referral:  Clinical Social Work  Discharge planning Services     Post Acute Care Choice:    Choice offered to:     DME Arranged:    DME Agency:     HH Arranged:    HH Agency:     Status of Service:  In process, will continue to follow  If discussed at Long Length of Stay Meetings, dates discussed:    Additional Comments:  Kermit Balo, RN 04/20/2017, 1:14 PM

## 2017-04-20 NOTE — Progress Notes (Signed)
RT called to patient's room to check CPAP mask - possibly missing velcro strap.  RT found pt on CPAP, with correct velcro straps in place.  Pt tolerating well at this time.

## 2017-04-20 NOTE — Progress Notes (Signed)
  Speech Language Pathology Treatment: Cognitive-Linquistic  Patient Details Name: Rachel Gilbert MRN: [id number] DOB: [birth year] Today's Date: 04/20/2017 Time: 1552-0802 SLP Time Calculation (min) (ACUTE ONLY): 20 min  Assessment / Plan / Recommendation Clinical Impression  Patient was very tired from participating in physical therapy earlier. She answered 10-12 requiring word to phrase answers. All questions were related to her medical history, family or work history. She is was able to answer all questions accurately and with only a slight delay (time to think and formulate message). She became emotional and cried once when answering questions about her son. She was able to tell me she was frustrated with how hard it is to speak. We reviewed some strategies that may help ito cue herself such as writing the message or even typing the message before saying it. She tried this strategy and agreed it does help to see it on paper.   HPI HPI: Pt is a 57 y.o. female with a history of OSA on CPAP, multiple sclerosis, obesity, hypertension, vision deficits, and hearing loss who presents for evaluation of stroke-like symptoms. Last seen normal was yesterday evening. Next morning when she woke up she reports that she was feeling off and generalized weakness. When she went to brush her teeth, she noted a right-sided facial droop in the mirror. She called a friend who noted that she had slurred, "heavy" speech on the phone. Facial droop has now resolved but patient continues to have difficulty finding words and "heavy" speech. No unilateral weakness or numbness, no headache. No personal history of strokes. Patient has family history of stroke in her father. She is not a smoker. She reports that she does not take her anti-hypertensive medications as prescribed. She denies chest pain or shortness of breath, nausea or vomiting. She is awake/alert but sleepy appearing at times - using CPAP entering room.       SLP  Plan  Continue with current plan of care                      Plan: Continue with current plan of care       GO               Lindalou Hose Amos Gaber, MA, CCC-SLP 04/20/2017 1:38 PM

## 2017-04-20 NOTE — Progress Notes (Signed)
Was informed during shift change from day RN, pt had no IV site.  IV team on floor now and asked to place IV.

## 2017-04-21 ENCOUNTER — Other Ambulatory Visit: Payer: Self-pay | Admitting: Cardiology

## 2017-04-21 DIAGNOSIS — I639 Cerebral infarction, unspecified: Secondary | ICD-10-CM

## 2017-04-21 MED ORDER — CLOPIDOGREL BISULFATE 75 MG PO TABS: 75.0000 mg | ORAL_TABLET | Freq: Every day | ORAL | 0 refills | Status: DC

## 2017-04-21 MED ORDER — ATORVASTATIN CALCIUM 80 MG PO TABS: 80.0000 mg | ORAL_TABLET | Freq: Every day | ORAL | 0 refills | Status: AC

## 2017-04-21 NOTE — Progress Notes (Signed)
The patient and her friend Baker Janus have decided that the patient can go home with Baker Janus. CM met with both of them and the patient prefers going to [place] over going to rehab. Also we have not received confirmation on SNF through her [facility].  CM provided them choice on [facility] agencies and they selected [facility]. Butch Penny with [facility] notified and accepted the referral.  Baker Janus to provide transportation home and supervision at home.  [place] address: [address] in [place] and her phone: [phone]

## 2017-04-21 NOTE — Progress Notes (Signed)
Pt discharge education and instructions completed with pt and friend [name] at bedside; both voices understanding and denies any questions. Pt IV and telemetry removed; pt discharge home with friend to transport her home. Pt to pick up electronically sent prescriptions from preferred pharmacy on file. Pt transported off unit via wheelchair with belongings and friend to the side. Dionne Bucy RN

## 2017-04-21 NOTE — Clinical Social Work Note (Signed)
Clinical Social Worker received notification from [facility] that patient has now decided to go home with friend and home health services.  CSW contacted [facility] Admissions coordinator to verify status of [employer] authorization, however no authorization has been given at this time.  Clinical Social Worker will sign off for now as social work intervention is no longer needed. Please consult [place] again if new need arises.  Macario Golds, [place] [phone]

## 2017-04-21 NOTE — Discharge Summary (Signed)
Physician Discharge Summary  Patient ID: Rachel Gilbert MRN: [id number] DOB/AGE: December 14, 1960 57 y.o.  Admit date: 04/12/2017 Discharge date: 04/21/2017  Admission Diagnoses:  Discharge Diagnoses:  Principal Problem:   CVA (cerebral vascular accident) [facility]) Active Problems:   Essential hypertension   Obesity   Obstructive sleep apnea on CPAP   Multiple sclerosis (HCC)   Depression with anxiety   Dysphagia   Discharged Condition: stable  Hospital Course: patient is a 57 year old female with  Past medical history significant for HTN, MS, OSA on CPAP and TIA.  Patient presented with altered mental status.  She was recently hospitalized at [facility] on 3/13 with aphasia, slurred speech and right-sided weakness.  She was seen by neurology who felt that patient either had a stroke or MS flare.  An MRI was attempted but she did not tolerate it due to claustrophobia.  She completed carotid Dopplers and echocardiogram.  She received 3 days of IV Solu-Medrol, improved and was discharged for outpatient follow-up. On 3/17, she was found to have worsening slurred speech, no limb weakness and presented to the ED.  Neurology was consulted and patient completed stroke workup.  Initial plan was to discharge patient to a SNF, but patient has decided that she should be discharged to her friend's place. PT/OT and Speech evaluation were repeated to ensure that it was okay to discharge patient to a friend's house with Home Health PT/ST.  Stroke: Neurology was consulted and patient has completed stroke workup.  MRI brain 04/13/17: Incomplete study.  Multiple areas of both focal and confluent restricted diffusion within the left MCA territory, left frontal predominant, representing nonhemorrhagic acute infarction, corresponding with the recent CT.  Multiple white matter lesions, periventricular predominant, consistent with chronic MS.  Repeat MRI brain 3/20: Patchy early subacute left MCA infarct without extension from  04/13/17.  No new infarct.  CTA head 3/18: No emergent large vessel occlusion.  As per neurology, left MCA distribution large subcortical infarct, source unknown versus MS lesion?  PML given periventricular location and patient is on long-term Tysabri for her MS.  Resultant expressive aphasia.  Carotid Dopplers: Less than 50% stenosis in the right and left ICA.  2D echo: LVEF 55-60% and no cardiac source of emboli identified.  TEE: No cardiac source of emboli was identified.  Transcranial Doppler studies with bubble study: Negative for PFO.  LDL 82.  A1c 5.5.  Patient was on aspirin 81 mg daily PTA (but seems to have been only started recently at [facility]), now on aspirin 81 mg + Plavix 75 mg (Neurology sign off note 3/21 mentions only aspirin.  I have consulted stroke MD, Dr. Roda Shutters 3/23 to clarify what antithrombotics patient needs to be on).  Patient underwent MRI/LP under sedation.  Spinal fluid sent for JC virus >negative.  HIV screen negative.  Hypercoagulable workup: Cardiolipin antibodies, beta-2 glycoprotein antibodies, lupus anticoagulant, protein C&S & Antithrombin III: Negative.  Prothrombin gene mutation and factor V Leyden: Pending.  Homocystine mildly elevated/16 >?  Start folic acid.  Cardiology was consulted regarding placement of implantable loop recorder due to cryptogenic stroke.  As per cardiology input 3/21, patient has profound deficits which limit her ability to interact with device clinic staff for remote monitoring and in addition she will be discharged to SNF and therefore they did not recommend implantable loop recorder at this time and advised 30-day monitor and if no arrhythmia seen on her monitor then further consideration for implantable loop recorder can be made once her stroke symptoms have  improved. Prior to DC, there will be need to arrange 30-day monitor at discharge.  Outpatient follow-up with Dr. Epimenio Foot, Neurology.     As per Neurologist, Dr. Roda Shutters, Stroke MD has continued continuing  aspirin 81 mg daily + Plavix 75 mg daily for 3 weeks followed by either aspirin or Plavix alone.  Multiple sclerosis: Patient followed by Dr. Epimenio Foot, Neurology as outpatient and takes Tysabri. As per patient and nursing, patient was due for herTysabri 3/22and were wondering if she can get it here. I discussed with Neuro hospitalist on call 3/22 who recommended that this cannot be done in the hospital or at [facility] and can only be done at accredited centers. Same relayed to CM yesterday and to patient today.  As per case management, patient's friend will pick up patient from SNF and take her to infusion center for medication next week.  Essential hypertension: Allowed for permissive hypertension given recent acute stroke.  Continue carvedilol 25 mg twice daily, hydralazine 50 mg twice daily and lisinopril 40 mg daily. 04/19/2017: Patient has been in the hospital since 04/12/2017.  Kindly continue to optimize patient's blood pressure.  Hyperlipidemia: LDL 82, goal <70.  Atorvastatin increased from 40mg  to 80 mg daily.  Morbid obesity/Body mass index is 40.17 kg/m.  Recommend weight loss.  OSA: Continue CPAP.  Acute kidney injury: Secondary to dehydration.  Resolved.  Leukocytosis: Initial WBC 16.  Possibly due to recent high-dose steroid use.  Improved. 04/19/2017: Leukocytosis is slowly resolving.  Will check CBC in the morning.  Hypokalemia: Replace and follow.  Check magnesium. 04/19/2017: The potassium is 3.7 today.  We will give patient K-Dur 78 M EQ p.o. x1 dose. We will continue to monitor renal function and electrolytes.  Consults: neurology, Electrophysiology and Physical Medicine  Significant Diagnostic Studies: MRI brain: Patchy early subacute left MCA infarcts without extension from 04/13/2017.  No new infarct.  Similar appearance of T2 hyperintensities throughout the cerebral white matter and deep gray nuclei compared to 2018. These may reflect a combination of chronic  multiple sclerosis and chronic small vessel ischemia.  Numerous chronic microhemorrhages throughout the brain suggesting chronic hypertension.  Discharge Exam: Blood pressure (!) 156/74, pulse 68, temperature 97.9 F (36.6 C), temperature source Oral, resp. rate 18, height 5\' 9"  (1.753 m), weight 123.4 kg (272 lb), last menstrual period 01/15/2015, SpO2 97 %.   Disposition: Discharge disposition: 01-Home or Self Care       Discharge Instructions    Diet - low sodium heart healthy   Complete by:  As directed    Diet - low sodium heart healthy   Complete by:  As directed    Discharge instructions   Complete by:  As directed    Increase activity slowly   Complete by:  As directed    Increase activity slowly   Complete by:  As directed      Allergies as of 04/21/2017      Reactions   Oxycodone Anaphylaxis      Medication List    STOP taking these medications   baclofen 10 MG tablet Commonly known as:  LIORESAL   FLUoxetine 40 MG capsule Commonly known as:  PROZAC   ibuprofen 200 MG tablet Commonly known as:  ADVIL,MOTRIN   oxybutynin 15 MG 24 hr tablet Commonly known as:  DITROPAN XL   spironolactone 50 MG tablet Commonly known as:  ALDACTONE   Vitamin D3 5000 units Caps     TAKE these medications   acetaminophen 500 MG  tablet Commonly known as:  TYLENOL Take 500 mg by mouth every 6 (six) hours as needed for mild pain.   ALPRAZolam 0.5 MG tablet Commonly known as:  XANAX Take 0.5 mg by mouth 2 (two) times daily as needed for anxiety.   aspirin 81 MG EC tablet Take 1 tablet (81 mg total) by mouth daily.   atorvastatin 80 MG tablet Commonly known as:  LIPITOR Take 1 tablet (80 mg total) by mouth daily at 6 PM. What changed:    medication strength  how much to take   carvedilol 25 MG tablet Commonly known as:  COREG Take 25 mg by mouth 2 (two) times daily with a meal.   clopidogrel 75 MG tablet Commonly known as:  PLAVIX Take 1 tablet (75 mg  total) by mouth daily. Start taking on:  04/22/2017   hydrALAZINE 50 MG tablet Commonly known as:  APRESOLINE Take 50 mg by mouth 2 (two) times daily.   lisinopril 40 MG tablet Commonly known as:  PRINIVIL,ZESTRIL Take 40 mg by mouth daily.   methylphenidate 10 MG tablet Commonly known as:  RITALIN Take 2 pills qAM and one pill po 4 hours later   natalizumab 300 MG/15ML injection Commonly known as:  TYSABRI Inject 15 mLs into the vein every 28 (twenty-eight) days.   ranitidine 300 MG tablet Commonly known as:  ZANTAC Take 300 mg by mouth every evening.        SignedBarnetta Chapel 04/21/2017, 1:44 PM

## 2017-04-21 NOTE — Progress Notes (Signed)
  Speech Language Pathology Treatment: Cognitive-Linquistic  Patient Details Name: Rachel Gilbert MRN: [id number] DOB: Jan 12, 1961 Today's Date: 04/21/2017 Time: 4259-5638 SLP Time Calculation (min) (ACUTE ONLY): 16 min  Assessment / Plan / Recommendation Clinical Impression  SLP was asked to see pt to assess safety to return home. Per chart review pt's cognitive-linguistic abilities have been fluctuating, at worst not being able to express basic wants/needs. Today her verbal expression is primarily at the word level, but she is also emotional at the moment, which may be exacerbating current function. Min cues were provided to perform basic naming tasks. When asked open-ended questions she has no response. Pt did demonstrate her ability to read at the sentence level with Mod I. Discussed with pt and her friend my recommendation for pt to have [facility] SLP services and 24/7 supervision if the plan is for her to return directly home to maximize safety in the setting of cognitive-linguistic impairment. Her friend is willing and able to provide this supervision. SLP provided training on safety precautions and methods of cueing. Also discussed writing down [place] safety phrases given that pt's reading appears to be a relative strength of hers. All questions were answered for now.   HPI HPI: Pt is a 57 y.o. female with a history of OSA on CPAP, multiple sclerosis, obesity, hypertension, vision deficits, and hearing loss who presents for evaluation of stroke-like symptoms. Last seen normal was yesterday evening. Next morning when she woke up she reports that she was feeling off and generalized weakness. When she went to brush her teeth, she noted a right-sided facial droop in the mirror. She called a friend who noted that she had slurred, "heavy" speech on the phone. Facial droop has now resolved but patient continues to have difficulty finding words and "heavy" speech. No unilateral weakness or numbness, no headache. No  personal history of strokes. Patient has family history of stroke in her father. She is not a smoker. She reports that she does not take her anti-hypertensive medications as prescribed. She denies chest pain or shortness of breath, nausea or vomiting. She is awake/alert but sleepy appearing at times - using CPAP entering room.       SLP Plan  Continue with current plan of care       Recommendations                   Follow up Recommendations: Home health SLP;24 hour supervision/assistance SLP Visit Diagnosis: Aphasia (R47.01) Plan: Continue with current plan of care       GO                Maxcine Ham 04/21/2017, 12:20 PM  Maxcine Ham, M.A. CCC-SLP [phone]

## 2017-04-27 ENCOUNTER — Telehealth: Payer: Self-pay | Admitting: Neurology

## 2017-04-27 NOTE — Telephone Encounter (Signed)
Spoke with Rachel Gilbert and advsied ok for PT once a wk for 1 wk, twice a wk. for 3 wks.  Vit. D was d/c in the hospital.  RAS will discuss the reasons why and put pt. back on it if needed, at f/u visit/fim

## 2017-04-27 NOTE — Telephone Encounter (Signed)
Rachel Gilbert with [facility] is calling to get verbal orders for PT 1 time a week for 1 week and 2 times a week for 3 weeks following a stroke. Physical therapist saw her last Friday and BP was 152/98. She also needs to clarify patient taking Vitamin D3.

## 2017-04-28 ENCOUNTER — Telehealth: Payer: Self-pay | Admitting: Neurology

## 2017-04-28 NOTE — Telephone Encounter (Signed)
Larita Fife PT with [facility] called wanting to notify the Dr of pts BP stating that while resting it had been 160/100, after 5 minutes of exercise 180/104, then 10 minutes of resting 162/110. Larita Fife can be reached at [phone] for any questions. FYI

## 2017-04-28 NOTE — Telephone Encounter (Signed)
Spoke with Kennyth Arnold and advised ok for ST once a wk. for one wk, 3 times/wk for 2 wks, then 2 times/wk for 3 wks.  Ok to take orders from pcp; pcp would need to sign any orders he gives/fim

## 2017-04-28 NOTE — Telephone Encounter (Signed)
Speech Therapist Amy Mckee(with [facility]) calling for verbal orders for: Home Health Speech Therapy  1 week 1  3 week 2  2 week 3  last part also would like orders to be able to take orders from PCP Ventura Sellers

## 2017-04-28 NOTE — Telephone Encounter (Signed)
Spoke with Larita Fife and requested they contact pcp (PCP manages pt's BP) to see what he feels meds need to be adjusted/fim

## 2017-04-29 ENCOUNTER — Encounter (HOSPITAL_COMMUNITY): Payer: Self-pay

## 2017-04-29 ENCOUNTER — Encounter (HOSPITAL_COMMUNITY)
Admission: RE | Admit: 2017-04-29 | Discharge: 2017-04-29 | Disposition: A | Discharge status: Home / Self Care | Payer: Medicare HMO | Source: Ambulatory Visit | Attending: Neurology | Admitting: Neurology

## 2017-04-29 DIAGNOSIS — G35 Multiple sclerosis: Secondary | ICD-10-CM | POA: Insufficient documentation

## 2017-04-29 HISTORY — DX: Cerebral infarction, unspecified: I63.9

## 2017-04-29 MED ORDER — SODIUM CHLORIDE 0.9 % IV SOLN
INTRAVENOUS | Status: AC
  Administered 2017-04-29: 08:00:00 via INTRAVENOUS

## 2017-04-29 MED ORDER — SODIUM CHLORIDE 0.9 % IV SOLN
300.0000 mg | INTRAVENOUS | Status: DC
  Administered 2017-04-29: 300 mg via INTRAVENOUS
  Filled 2017-04-29: qty 15

## 2017-05-01 ENCOUNTER — Telehealth: Payer: Self-pay | Admitting: Neurology

## 2017-05-01 NOTE — Telephone Encounter (Signed)
Returned call to [facility] - they are aware the patient has sufferred a stroke.  They will be faxing over an adverse event report for Dr. Epimenio Foot to complete.

## 2017-05-01 NOTE — Telephone Encounter (Signed)
Benzena with [employer] called requesting a call back to discuss the pts Tysabri treatment. Rachel Gilbert can be reached at [phone] case # [id number]

## 2017-05-04 ENCOUNTER — Encounter: Payer: Self-pay | Admitting: Cardiology

## 2017-05-07 ENCOUNTER — Encounter: Payer: Self-pay | Admitting: Neurology

## 2017-05-07 ENCOUNTER — Ambulatory Visit: Payer: Medicare HMO | Admitting: Neurology

## 2017-05-07 VITALS — BP 149/90 | HR 62 | Ht 69.0 in | Wt 266.0 lb

## 2017-05-07 DIAGNOSIS — R4701 Aphasia: Secondary | ICD-10-CM

## 2017-05-07 DIAGNOSIS — G4733 Obstructive sleep apnea (adult) (pediatric): Secondary | ICD-10-CM | POA: Diagnosis not present

## 2017-05-07 DIAGNOSIS — R269 Unspecified abnormalities of gait and mobility: Secondary | ICD-10-CM

## 2017-05-07 DIAGNOSIS — I6300 Cerebral infarction due to thrombosis of unspecified precerebral artery: Secondary | ICD-10-CM | POA: Diagnosis not present

## 2017-05-07 DIAGNOSIS — I10 Essential (primary) hypertension: Secondary | ICD-10-CM | POA: Diagnosis not present

## 2017-05-07 DIAGNOSIS — Z9989 Dependence on other enabling machines and devices: Secondary | ICD-10-CM

## 2017-05-07 DIAGNOSIS — G35 Multiple sclerosis: Secondary | ICD-10-CM | POA: Diagnosis not present

## 2017-05-07 DIAGNOSIS — R5383 Other fatigue: Secondary | ICD-10-CM | POA: Diagnosis not present

## 2017-05-07 NOTE — Progress Notes (Signed)
[facility]  PATIENT: Rachel Gilbert DOB: 08/25/1960  REFERRING DOCTOR OR PCP:  ER  No PCP SOURCE: ER notes, images on PACS, patient  _________________________________   HISTORICAL  CHIEF COMPLAINT:  Chief Complaint  Patient presents with  . Multiple Sclerosis    Patient had a stroke in March.     HISTORY OF PRESENT ILLNESS:  Rachel Gilbert is 57 y.o. woman with MS and OSA.    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 She also had some  There was a somewhat stuttering course and she first presented a few days earlier to an outside hospital and was  Treated with 3 days of IV Solu-Medrol with the thought that she might be having an exacerbation.  When symptoms worsen on 04/12/2017 she presented to [facility].  I have reviewed imaging studies and laboratory results from that hospitalization.  Because she is on Tysabri, there was initially some concern that the stroke could represent PML.  However, she underwent a lumbar puncture in the CSF did not show any evidence of JCV by PCR.  Additionally, CT angiogram appears to show occlusion of 1 of the MCA branches.     She has been placed on aspirin and then Plavix was added.  She had a TEE but no evidence of a PFO or other source of emboli.  Doppler study showed less than 50% stenosis.  A loop recorder is being considered.    Risk factors:   HTN (on 3 med's), OSA, no DM, no smoking, lipids ok (though Lipitor was added).   .   For her MS, she continues on Tysabri.  Last infusion was 04/29/2017.    She tolerates it well and has not had any definite exacerbations since starting.    She has some gait issues but this is the same as before the stroke.    She has more anxiety, helped by escitalopram.  Update 11/06/2016:    She feels her MS is doing well. She is on Tysabri. She tolerates it well and has not had any definite  exacerbations. Her last JCV antibody (05/07/2016) was negative at 0.15.  She denies any new symptoms.   Her gait is the same but she notes more left calf cramps.   Gait is wore on an uneven surface and climbing a few steps can be difficult.   She did have one fall when she bent down to pick up an ice cube that fell.   She did not hurt her head.     Her arms feels strong but the legs feels heavy (R=L) .  She denies numbness.    She notes frequency and urgency and rare urge incontinence.    Vision is doing well.   Fatigue and sleepiness persist to some extent, even with CPAP.  She feels a second wind in the late mornings .  She stopped phentermine because it did not help weight loss and did not help focus too much.  Many years ago she was o Ritalin with benefit for focus and attention, fatigue and weight loss..    She has severe OSA and uses CPAP nightly (+8 cm)  _________________________________________ From 05/07/2016:  MS:   She has been on Tysabri since 05/2014.   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   We discussed the risks and benefits of Tysabri (about 1:2000 risk of PML assuming she fluctuates from low positive or negative)   OSA:   She has severe OSA. She is currently on CPAP +8 cm. She tolerates it well. I looked at the download from the last 53 days. It showed 100% compliance and a very good AHI equals 3.7. However, there was more than expected leak with 21.3 L/m.   The sleep study 2009 showed an AHI equals 38 with oxygen desaturations to 76%.   She notes that her mask sometimes leaks. She does not think that she snores through the mask.   She uses a FF mask and notes breathing through her mouth  Gait/strength/sensation/coordination:   Gait is slightly better.   Her  new home does not have stairs and she does not tire out as much getting about.   Ampyra helped but the co-pay was very high at $400 and she cannot afford that. She does not think that she could afford the compounded drug (which is usually about $60 a month).    Balance is poor affecting gait.    She often uses a cane for balance.    Her legs are both mildly weak.   She has most trouble with changes in surface or with curbs.   . She denies weakness in the arms. She denies any numbness in the arms or legs. She has mild leg clumsiness. Ampyra has helped her gait and she has been on x 2 years.       Bladder/bowel: She has urinary urgency, frequency and nocturia (twice a night). Ditropan XL 10 mg has helped but she just takes now and then since starting the new CPAP machine.   At most she has one x nocturia.     Vision: She feels her vision is fine.  Fatigue/sleep: She has physical and mental fatigue.  Fatigue is better on Tysabri than previous treatments.  She feels less fstigue and sleepiness with her CPAP machine.   Ampyra helps her gait endurance as she is able to walk a little further with it.  Phentermine helps as much as Adderall but makes her feel hot at times.    She has insomnia, both sleep onset and sleep maintenance.   Mood/Cognition:   She still has some crying spells and sometimes inappropriate crying.    She could not afford Nuedexta.    Her boyfriend died in a motorcycle accident last year.    She has done grief counseling.   She feels her cognitive fog is reduced since being on Tysabri and with stimulants.       MS history: She was diagnosed in September 2012 after presenting with diplopia. She was hospitalized and received 5 days of IV steroids and she had MRI and lumbar puncture. She recalls that the lumbar puncture was not consistent with MS but the MRI was.  She was placed on Gilenya in December 2012. She was able to return to work and the diplopia completely resolved. Then in July 2014  she had another exacerbation with visual disturbance bilaterally, gait changes and cognitive changes.  Reportedly, there was a new lesion in the brainstem and also in the spinal cord at that time. She received 5 more days of steroid.  In early May 2016, she had the onset of several symptoms including worse cognitive problems, worse gait problems and weakness in the hands.   She went to the [place] regional emergency room on 06/03/2014 and she was evaluated. An admitted and received 3 days of IV Solu-Medrol.The MRI of the brain shows many white matter lesions. Some are periventricular with an appearance consistent with MS. She also has deep white matter subcortical and juxtacortical foci. Some of these look more consistent with small vessel ischemic changes and most more consistent with multiple sclerosis. She does have new foci in the left vermis of the cerebellum and in the left frontal periventricular region.[name]    REVIEW OF SYSTEMS: Constitutional: No fevers, chills, sweats, or change in appetite.   She has fatigue Eyes: No visual changes, double vision, eye pain Ear, nose and throat: No hearing loss, ear pain, nasal congestion, sore throat Cardiovascular: No chest pain, palpitations Respiratory: No shortness of breath at rest or with exertion.   No wheezes GastrointestinaI: No nausea, vomiting, diarrhea, abdominal pain, fecal incontinence Genitourinary: No dysuria, urinary retention.   Frequency better on medications. Musculoskeletal: No neck pain, back pain Integumentary: No rash, pruritus, skin lesions Neurological: as above Psychiatric: No depression at this time.  No anxiety Endocrine: No palpitations, diaphoresis, change in appetite, change in weigh or increased thirst Hematologic/Lymphatic: No anemia, purpura, petechiae. Allergic/Immunologic: No itchy/runny eyes, nasal congestion, recent allergic reactions, rashes  ALLERGIES: Allergies  Allergen Reactions  . Oxycodone Anaphylaxis     HOME MEDICATIONS:  Current Outpatient Medications:  .  acetaminophen (TYLENOL) 500 MG tablet, Take 500 mg by mouth every 6 (six) hours as needed for mild pain., Disp: , Rfl:  .  ALPRAZolam (XANAX) 0.5 MG tablet, Take 0.5 mg by mouth 2 (two) times daily as needed for anxiety., Disp: , Rfl:  .  amLODipine (NORVASC) 10 MG tablet, Take 10 mg by mouth daily., Disp: , Rfl:  .  aspirin EC 81 MG EC tablet, Take 1 tablet (81 mg total) by mouth daily., Disp: 30 tablet, Rfl: 0 .  atorvastatin (LIPITOR) 80 MG tablet, Take 1 tablet (80 mg total) by mouth daily at 6 PM., Disp: 30 tablet, Rfl: 0 .  carvedilol (COREG) 25 MG tablet, Take 25 mg by mouth 2 (two) times daily with a meal., Disp: , Rfl:  .  clopidogrel (PLAVIX) 75 MG tablet, Take 1 tablet (75 mg total) by mouth daily., Disp: 30 tablet, Rfl: 0 .  escitalopram (LEXAPRO) 20 MG tablet, Take 20 mg by mouth daily., Disp: , Rfl:  .  hydrALAZINE (APRESOLINE) 50 MG tablet, Take 50 mg by mouth 2 (two) times daily., Disp: , Rfl:  .  lisinopril (PRINIVIL,ZESTRIL) 40 MG tablet, Take 40 mg by mouth daily., Disp: , Rfl:  .  natalizumab (TYSABRI) 300 MG/15ML injection, Inject 15 mLs into the vein every 28 (twenty-eight) days. , Disp: , Rfl:  .  methylphenidate (RITALIN) 10 MG tablet, Take 2 pills qAM and one pill po 4 hours later (Patient not taking: Reported on 05/07/2017), Disp: 90 tablet, Rfl: 0  PAST MEDICAL HISTORY: Past Medical History:  Diagnosis Date  . Complication of anesthesia    hard time waking up   . Hearing loss   . Hypertension   . Kidney stones   . Multiple sclerosis (HCC)   . Sleep apnea    Uses CPAP  . Stroke (HCC) 03/2017  . Vision abnormalities     PAST SURGICAL HISTORY: Past Surgical History:  Procedure  Laterality Date  . CESAREAN SECTION  1986  . CHOLECYSTECTOMY  2000   laparoscopic  . IR FLUORO GUIDED NEEDLE PLC ASPIRATION/INJECTION LOC  04/15/2017  . RADIOLOGY WITH ANESTHESIA N/A 04/15/2017   Procedure: LUMBER PUNCTURE  MRI SCAN;  Surgeon: Radiologist, Medication, MD;  Location: [facility] OR;  Service: Radiology;  Laterality: N/A;  . TEE WITHOUT CARDIOVERSION N/A 04/16/2017   Procedure: TRANSESOPHAGEAL ECHOCARDIOGRAM (TEE) WITH LOOP;  Surgeon: Wendall Stade, MD;  Location: [facility] ENDOSCOPY;  Service: Cardiovascular;  Laterality: N/A;    FAMILY HISTORY: Family History  Problem Relation Age of Onset  . Fibromyalgia Mother   . Stroke Father   . Diabetes Father   . Stroke Other     SOCIAL HISTORY:  Social History   Socioeconomic History  . Marital status: Single    Spouse name: Not on file  . Number of children: Not on file  . Years of education: Not on file  . Highest education level: Not on file  Occupational History  . Occupation: disabled  Social Needs  . Financial resource strain: Not on file  . Food insecurity:    Worry: Not on file    Inability: Not on file  . Transportation needs:    Medical: Not on file    Non-medical: Not on file  Tobacco Use  . Smoking status: Never Smoker  . Smokeless tobacco: Never Used  Substance and Sexual Activity  . Alcohol use: Yes    Comment: Occasional  . Drug use: No  . Sexual activity: Not on file  Lifestyle  . Physical activity:    Days per week: Not on file    Minutes per session: Not on file  . Stress: Not on file  Relationships  . Social connections:    Talks on phone: Not on file    Gets together: Not on file    Attends religious service: Not on file    Active member of club or organization: Not on file    Attends meetings of clubs or organizations: Not on file    Relationship status: Not on file  . Intimate partner violence:    Fear of current or ex partner: Not on file    Emotionally abused: Not on file    Physically abused: Not on file    Forced sexual activity: Not on file  Other Topics Concern  . Not on file  Social History Narrative  . Not on file     PHYSICAL EXAM  Vitals:   05/07/17 1103  BP: (!) 149/90  Pulse: 62  Weight:  266 lb (120.7 kg)  Height: 5\' 9"  (1.753 m)    Body mass index is 39.28 kg/m.   General: The patient is well-developed and well-nourished and in no acute distress  Neurologic Exam  Mental status: The patient is alert and oriented x 3 at the time of the examination.   Her affect is okay but she does appear a little anxious.  She is alert and oriented.  She follows three-step commands.  She can read and do simple math.  She has reduced focus and attention.  Short-term memory is mildly reduced.  She has an expressive aphasia that is mild.  She was able to name most items I pointed to correctly.  Cranial nerves: Extraocular movements are full. Facial strength and sensation is normal.. treapezius and sternocleidomastoid strength is normal. No dysarthria is noted.  The tongue is midline, and the patient has  elevation of the soft palate.  Motor:  Muscle bulk is normal.  Muscle tone is normal in the arms are mildly increased in the left leg.  Strength is 5/5.  Sensory: Sensory testing shows intact touch sensation in the arms and legs  Coordination: Cerebellar testing reveals good finger-nose-finger reduced left heel to shin n .  Gait and station: Station is normal.   Gait is wide with a mildly reduced stride.  The tandem gait is difficult.  Romberg is negative.   Reflexes: Deep tendon reflexes are symmetric and slightly increased in legs.       DIAGNOSTIC DATA (LABS, IMAGING, TESTING) - I reviewed patient records, labs, notes, testing and imaging myself where available.  Lab Results  Component Value Date   WBC 12.1 (H) 04/16/2017   HGB 12.9 04/16/2017   HCT 41.0 04/16/2017   MCV 91.1 04/16/2017   PLT 200 04/16/2017      Component Value Date/Time   NA 142 04/19/2017 0540   NA 144 02/12/2015 1139   K 3.7 04/19/2017 0540   CL 109 04/19/2017 0540   CO2 25 04/19/2017 0540   GLUCOSE 107 (H) 04/19/2017 0540   BUN 16 04/19/2017 0540   BUN 15 02/12/2015 1139   CREATININE 1.16 (H)  04/19/2017 0540   CALCIUM 9.0 04/19/2017 0540   PROT 6.2 (L) 04/12/2017 1954   PROT 6.1 11/16/2014 1047   ALBUMIN 3.6 04/12/2017 1954   ALBUMIN 4.5 11/16/2014 1047   AST 22 04/12/2017 1954   ALT 24 04/12/2017 1954   ALKPHOS 70 04/12/2017 1954   BILITOT 0.8 04/12/2017 1954   BILITOT 1.2 11/16/2014 1047   GFRNONAA 52 (L) 04/19/2017 0540   GFRAA >60 04/19/2017 0540      ASSESSMENT AND PLAN  Multiple sclerosis (HCC) - Plan: Comprehensive metabolic panel, CBC with Differential/Platelet, VITAMIN D 25 Hydroxy (Vit-D Deficiency, Fractures), Stratify JCV Antibody Test ([facility])  Cerebrovascular accident (CVA) due to thrombosis of precerebral artery (HCC)  Obstructive sleep apnea on CPAP  Essential hypertension  Gait disturbance  Other fatigue  Aphasia   1.     She will continue Tysabri. We will check a JCV antibody and CBC today. If she does convert to mid or high positive we will need to reconsider a different disease modifying therapy.  2.    Ritalin for attention deficit, fatigue and weight loss.  3.   She cannot afford Ampyra no longer gets patient assistance. I believe that it'll be available as a generic soon and hopefully her next visit we will be able to get her back on it.  4.   She will return in 6 months for f/u with me or call sooner if problems  Due to her aphasia, results can be called to [facility] [phone]  45 minutes face-to-face evaluation with greater than one half the time counseling and coordinating care about her aphasia and recent stroke and her existing multiple sclerosis and related symptoms   Lometa Riggin A. Epimenio Foot, MD, PhD 05/07/2017, 11:39 AM Certified in Neurology, Clinical Neurophysiology, Sleep Medicine, Pain Medicine and Neuroimaging  [facility] [address] [place], [place] [address] [phone]                          8

## 2017-05-08 LAB — CBC WITH DIFFERENTIAL/PLATELET
BASOS: 0 %
Basophils Absolute: 0 10*3/uL (ref 0.0–0.2)
EOS (ABSOLUTE): 0.3 10*3/uL (ref 0.0–0.4)
EOS: 3 %
HEMATOCRIT: 41.3 % (ref 34.0–46.6)
Hemoglobin: 13.1 g/dL (ref 11.1–15.9)
Immature Grans (Abs): 0.1 10*3/uL (ref 0.0–0.1)
Immature Granulocytes: 1 %
LYMPHS ABS: 3 10*3/uL (ref 0.7–3.1)
Lymphs: 32 %
MCH: 28.8 pg (ref 26.6–33.0)
MCHC: 31.7 g/dL (ref 31.5–35.7)
MCV: 91 fL (ref 79–97)
MONOCYTES: 8 %
Monocytes Absolute: 0.7 10*3/uL (ref 0.1–0.9)
Neutrophils Absolute: 5.2 10*3/uL (ref 1.4–7.0)
Neutrophils: 56 %
Platelets: 281 10*3/uL (ref 150–379)
RBC: 4.55 x10E6/uL (ref 3.77–5.28)
RDW: 14.8 % (ref 12.3–15.4)
WBC: 9.3 10*3/uL (ref 3.4–10.8)

## 2017-05-08 LAB — COMPREHENSIVE METABOLIC PANEL
ALBUMIN: 4.2 g/dL (ref 3.5–5.5)
ALK PHOS: 101 IU/L (ref 39–117)
ALT: 20 IU/L (ref 0–32)
AST: 14 IU/L (ref 0–40)
Albumin/Globulin Ratio: 1.9 (ref 1.2–2.2)
BUN / CREAT RATIO: 19 (ref 9–23)
BUN: 19 mg/dL (ref 6–24)
Bilirubin Total: 0.5 mg/dL (ref 0.0–1.2)
CO2: 22 mmol/L (ref 20–29)
CREATININE: 1 mg/dL (ref 0.57–1.00)
Calcium: 9.3 mg/dL (ref 8.7–10.2)
Chloride: 105 mmol/L (ref 96–106)
GFR, EST AFRICAN AMERICAN: 73 mL/min/{1.73_m2} (ref >59)
GFR, EST NON AFRICAN AMERICAN: 64 mL/min/{1.73_m2} (ref >59)
GLUCOSE: 89 mg/dL (ref 65–99)
Globulin, Total: 2.2 g/dL (ref 1.5–4.5)
Potassium: 3.9 mmol/L (ref 3.5–5.2)
Sodium: 144 mmol/L (ref 134–144)
TOTAL PROTEIN: 6.4 g/dL (ref 6.0–8.5)

## 2017-05-08 LAB — VITAMIN D 25 HYDROXY (VIT D DEFICIENCY, FRACTURES): Vit D, 25-Hydroxy: 35.8 ng/mL (ref 30.0–100.0)

## 2017-05-11 ENCOUNTER — Telehealth: Payer: Self-pay

## 2017-05-11 ENCOUNTER — Telehealth: Payer: Self-pay | Admitting: Cardiology

## 2017-05-11 NOTE — Telephone Encounter (Signed)
I called pt, advised her of her lab results per Dr. Epimenio Foot. Pt verbalized understanding of results. Pt had no questions at this time but was encouraged to call back if questions arise.

## 2017-05-11 NOTE — Telephone Encounter (Signed)
New message       Received referral for an event monitor on 04-21-17.  Several attempts were made to the pt to schedule appt (04-22-17, 04-28-17 and 05-01-17) with no response from pt.  A letter was mailed on 05-04-17 and on 05-11-17 event monitor order was marked "pt refused".

## 2017-05-11 NOTE — Telephone Encounter (Signed)
-----   Message from Asa Lente, MD sent at 05/08/2017 11:26 PM EDT ----- Please let the patient know that the lab work is ok.   The kidney function is still slightly reduced as it was when she was in the hospital.

## 2017-05-13 ENCOUNTER — Encounter (HOSPITAL_COMMUNITY): Payer: Medicare HMO

## 2017-05-14 ENCOUNTER — Telehealth: Payer: Self-pay | Admitting: Neurology

## 2017-05-14 NOTE — Telephone Encounter (Signed)
Spoke with Amy at [facility] and advised it is ok to move Rachel Gilbert's appt's/make up missed appt's./fim

## 2017-05-14 NOTE — Telephone Encounter (Signed)
Amy McKee/[facility] [phone] speech therapy called to advise she will be out of town next week and the pt is requesting all appts to be cancelled next week and is wanting to make up the missed visits at the end of plan of care. She needs VO for the visits to be moved to the end, please call to advise.

## 2017-05-15 ENCOUNTER — Encounter (HOSPITAL_COMMUNITY): Payer: Medicare HMO

## 2017-05-18 ENCOUNTER — Encounter: Payer: Self-pay | Admitting: *Deleted

## 2017-05-25 DIAGNOSIS — E785 Hyperlipidemia, unspecified: Secondary | ICD-10-CM | POA: Insufficient documentation

## 2017-06-05 ENCOUNTER — Encounter: Payer: Self-pay | Admitting: *Deleted

## 2017-06-05 DIAGNOSIS — R899 Unspecified abnormal finding in specimens from other organs, systems and tissues: Secondary | ICD-10-CM | POA: Insufficient documentation

## 2017-06-05 DIAGNOSIS — Z79899 Other long term (current) drug therapy: Secondary | ICD-10-CM | POA: Insufficient documentation

## 2017-06-12 ENCOUNTER — Encounter (HOSPITAL_COMMUNITY)
Admission: RE | Admit: 2017-06-12 | Discharge: 2017-06-12 | Disposition: A | Discharge status: Home / Self Care | Payer: Medicare HMO | Source: Ambulatory Visit | Attending: Neurology | Admitting: Neurology

## 2017-06-12 ENCOUNTER — Encounter (HOSPITAL_COMMUNITY): Payer: Self-pay

## 2017-06-12 DIAGNOSIS — G35 Multiple sclerosis: Secondary | ICD-10-CM | POA: Diagnosis not present

## 2017-06-12 MED ORDER — SODIUM CHLORIDE 0.9 % IV SOLN
INTRAVENOUS | Status: AC
  Administered 2017-06-12: 12:00:00 via INTRAVENOUS

## 2017-06-12 MED ORDER — SODIUM CHLORIDE 0.9 % IV SOLN
300.0000 mg | INTRAVENOUS | Status: DC
  Administered 2017-06-12: 300 mg via INTRAVENOUS
  Filled 2017-06-12: qty 15

## 2017-06-12 NOTE — Discharge Instructions (Signed)
°Tysabri °Natalizumab injection °What is this medicine? °NATALIZUMAB (na ta LIZ you mab) is used to treat relapsing multiple sclerosis. This drug is not a cure. It is also used to treat Crohn's disease. °This medicine may be used for other purposes; ask your health care provider or pharmacist if you have questions. °COMMON BRAND NAME(S): Tysabri °What should I tell my health care provider before I take this medicine? °They need to know if you have any of these conditions: °-immune system problems °-progressive multifocal leukoencephalopathy (PML) °-an unusual or allergic reaction to natalizumab, other medicines, foods, dyes, or preservatives °-pregnant or trying to get pregnant °-breast-feeding °How should I use this medicine? °This medicine is for infusion into a vein. It is given by a health care professional in a hospital or clinic setting. °A special MedGuide will be given to you by the pharmacist with each prescription and refill. Be sure to read this information carefully each time. °Talk to your pediatrician regarding the use of this medicine in children. This medicine is not approved for use in children. °Overdosage: If you think you have taken too much of this medicine contact a poison control center or emergency room at once. °NOTE: This medicine is only for you. Do not share this medicine with others. °What if I miss a dose? °It is important not to miss your dose. Call your doctor or health care professional if you are unable to keep an appointment. °What may interact with this medicine? °-azathioprine °-cyclosporine °-interferon °-6-mercaptopurine °-methotrexate °-steroid medicines like prednisone or cortisone °-TNF-alpha inhibitors like adalimumab, etanercept, and infliximab °-vaccines °This list may not describe all possible interactions. Give your health care provider a list of all the medicines, herbs, non-prescription drugs, or dietary supplements you use. Also tell them if you smoke, drink  alcohol, or use illegal drugs. Some items may interact with your medicine. °What should I watch for while using this medicine? °Your condition will be monitored carefully while you are receiving this medicine. Visit your doctor for regular check ups. Tell your doctor or healthcare professional if your symptoms do not start to get better or if they get worse. °Stay away from people who are sick. Call your doctor or health care professional for advice if you get a fever, chills or sore throat, or other symptoms of a cold or flu. Do not treat yourself. °In some patients, this medicine may cause a serious brain infection that may cause death. If you have any problems seeing, thinking, speaking, walking, or standing, tell your doctor right away. If you cannot reach your doctor, get urgent medical care. °What side effects may I notice from receiving this medicine? °Side effects that you should report to your doctor or health care professional as soon as possible: °-allergic reactions like skin rash, itching or hives, swelling of the face, lips, or tongue °-breathing problems °-changes in vision °-chest pain °-dark urine °-depression, feelings of sadness °-dizziness °-general ill feeling or flu-like symptoms °-irregular, missed, or painful menstrual periods °-light-colored stools °-loss of appetite, nausea °-muscle weakness °-problems with balance, talking, or walking °-right upper belly pain °-unusually weak or tired °-yellowing of the eyes or skin °Side effects that usually do not require medical attention (report to your doctor or health care professional if they continue or are bothersome): °-aches, pains °-headache °-stomach upset °-tiredness °This list may not describe all possible side effects. Call your doctor for medical advice about side effects. You may report side effects to FDA at 1-800-FDA-1088. °Where should I   keep my medicine? °This drug is given in a hospital or clinic and will not be stored at  home. °NOTE: This sheet is a summary. It may not cover all possible information. If you have questions about this medicine, talk to your doctor, pharmacist, or health care provider. °© 2018 Elsevier/Gold Standard (2008-03-04 13:33:21) ° °

## 2017-06-12 NOTE — Progress Notes (Signed)
Patient unable to stay for 1 hour post Tysabri infusion. Aware to call MD for problems and concerns.

## 2017-06-23 ENCOUNTER — Encounter: Payer: Self-pay | Admitting: *Deleted

## 2017-06-25 ENCOUNTER — Telehealth: Payer: Self-pay | Admitting: Neurology

## 2017-06-25 ENCOUNTER — Encounter: Payer: Self-pay | Admitting: *Deleted

## 2017-06-25 NOTE — Telephone Encounter (Signed)
PA for Tysabri 300mg /73ml completed and faxed to [facility], fax# [phone]/fim

## 2017-06-25 NOTE — Telephone Encounter (Signed)
Pt called she has an infusion scheduled at [facility] on 7/19 but insurance approval is thru 7/14. When getting this approved she would like for infusion to approved thru the end of 2019 if possible. Please call to advise when it has been approved  Thank you

## 2017-06-26 NOTE — Telephone Encounter (Signed)
Recevied approval from [place] for Tysabri.  Pt ID#[id number].  No expiration date provided.  However, the approval letter stated that benefits, premium and/or co-paments/co-insurance may change on January 1 of each year.  A copy of this letter has been faxed to [facility].

## 2017-07-10 ENCOUNTER — Encounter (HOSPITAL_COMMUNITY): Payer: Self-pay

## 2017-07-10 ENCOUNTER — Encounter (HOSPITAL_COMMUNITY)
Admission: RE | Admit: 2017-07-10 | Discharge: 2017-07-10 | Disposition: A | Discharge status: Home / Self Care | Payer: Medicare HMO | Source: Ambulatory Visit | Attending: Neurology | Admitting: Neurology

## 2017-07-10 DIAGNOSIS — G35 Multiple sclerosis: Secondary | ICD-10-CM | POA: Insufficient documentation

## 2017-07-10 MED ORDER — SODIUM CHLORIDE 0.9 % IV SOLN
300.0000 mg | INTRAVENOUS | Status: AC
  Administered 2017-07-10: 300 mg via INTRAVENOUS
  Filled 2017-07-10: qty 15

## 2017-07-10 MED ORDER — SODIUM CHLORIDE 0.9 % IV SOLN
INTRAVENOUS | Status: AC
  Administered 2017-07-10: 08:00:00 via INTRAVENOUS

## 2017-07-10 NOTE — Progress Notes (Signed)
Post-infusion of Tysabri , patient stayed 10 minutes, pt. To follow-up with doctor with any problems.

## 2017-07-10 NOTE — Discharge Instructions (Signed)

## 2017-08-06 ENCOUNTER — Other Ambulatory Visit: Payer: Self-pay

## 2017-08-06 ENCOUNTER — Encounter: Payer: Self-pay | Admitting: Neurology

## 2017-08-06 ENCOUNTER — Ambulatory Visit: Payer: Medicare HMO | Admitting: Neurology

## 2017-08-06 VITALS — BP 133/80 | HR 64 | Resp 20 | Ht 69.0 in | Wt 273.0 lb

## 2017-08-06 DIAGNOSIS — Z79899 Other long term (current) drug therapy: Secondary | ICD-10-CM

## 2017-08-06 DIAGNOSIS — R4701 Aphasia: Secondary | ICD-10-CM | POA: Diagnosis not present

## 2017-08-06 DIAGNOSIS — Z9989 Dependence on other enabling machines and devices: Secondary | ICD-10-CM

## 2017-08-06 DIAGNOSIS — G4733 Obstructive sleep apnea (adult) (pediatric): Secondary | ICD-10-CM

## 2017-08-06 DIAGNOSIS — R269 Unspecified abnormalities of gait and mobility: Secondary | ICD-10-CM | POA: Diagnosis not present

## 2017-08-06 DIAGNOSIS — R41840 Attention and concentration deficit: Secondary | ICD-10-CM

## 2017-08-06 DIAGNOSIS — I6300 Cerebral infarction due to thrombosis of unspecified precerebral artery: Secondary | ICD-10-CM

## 2017-08-06 DIAGNOSIS — R600 Localized edema: Secondary | ICD-10-CM

## 2017-08-06 DIAGNOSIS — G35 Multiple sclerosis: Secondary | ICD-10-CM | POA: Diagnosis not present

## 2017-08-06 NOTE — Progress Notes (Signed)
[facility]  PATIENT: Rachel Gilbert DOB: [birth year]  REFERRING DOCTOR OR PCP:  ER  No PCP SOURCE: ER notes, images on PACS, patient  _________________________________   HISTORICAL  CHIEF COMPLAINT:  Chief Complaint  Patient presents with  . Multiple Sclerosis    Sts. she continues to tolerate Tysabri well.  JCV ab last checked 05/07/17 and was Indeterminate at 0.22.  Assay was Negative.  would like to discuss swelling in her feet/ankles.  Would also like to discuss bariatric surgery for wt. loss/fim  . Hx. of CVA    HISTORY OF PRESENT ILLNESS:  Rachel Gilbert is 57 y.o. woman with MS and OSA.    Update 08/06/2017: She feels her MS has been stable.  She is on Tysabri 300 mg every 4 weeks.  She tolerates it well.  Her JCV antibody is negative.  (0.22)  She has not had any more strokelike symptoms.  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  She still has mild aphasia.  Because she was on Tysabri, she also had a lumbar puncture to rule out PML.  CT angiogram showed occlusion of 1 of the MCA branches.  TEE did not show a PFO or other source of emboli.  Carotid Dopplers did not show significant stenosis.  She was discharged on aspirin Plavix and continues on these.  She is doing about the same with her gait.   She stumbles but no recent falls.   Hr arm and leg strength are bout the same.     She is considering bariatric surgery.  She has OSA and uses CPAP nightly.    She still has some insomnia and rarely takes xanax if still awake after a while.    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 She also had some  There was a somewhat stuttering course and she first presented a few days earlier to an outside hospital and was  Treated with 3 days of IV Solu-Medrol with the thought that she might be  having an exacerbation.  When symptoms worsen on 04/12/2017 she presented to [facility].  I have reviewed imaging studies and laboratory results from that hospitalization.  Because she is on Tysabri, there was initially some concern that the stroke could represent PML.  However, she underwent a lumbar puncture in the CSF did not show any evidence of JCV by PCR.  Additionally, CT angiogram appears to show occlusion of 1 of the MCA branches.     She has been placed on aspirin and then Plavix was added.  She had a TEE but no evidence of a PFO or other source of emboli.  Doppler study showed less than 50% stenosis.  A loop recorder is being considered.    Risk factors:   HTN (on 3 med's), OSA, no DM, no smoking, lipids ok (though Lipitor was added).   .   For her MS, she continues on Tysabri.  Last infusion was 04/29/2017.    She tolerates it well and has not had any definite exacerbations since starting.    She has some gait issues but this is the same as before the stroke.    She has more anxiety, helped by escitalopram.  Update 11/06/2016:    She feels her MS is doing well. She is on Tysabri. She tolerates it well and  has not had any definite exacerbations. Her last JCV antibody (05/07/2016) was negative at 0.15.  She denies any new symptoms.   Her gait is the same but she notes more left calf cramps.   Gait is wore on an uneven surface and climbing a few steps can be difficult.   She did have one fall when she bent down to pick up an ice cube that fell.   She did not hurt her head.     Her arms feels strong but the legs feels heavy (R=L) .  She denies numbness.    She notes frequency and urgency and rare urge incontinence.    Vision is doing well.   Fatigue and sleepiness persist to some extent, even with CPAP.  She feels a second wind in the late mornings .  She stopped phentermine because it did not help weight loss and did not help focus too much.  Many years ago she was o Ritalin with benefit for focus and  attention, fatigue and weight loss..    She has severe OSA and uses CPAP nightly (+8 cm)                                                                                                                                                                                       _________________________________________ From 05/07/2016:  MS:   She has been on Tysabri since 05/2014.   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   We discussed the risks and benefits of Tysabri (about 1:2000 risk of PML assuming she fluctuates from low positive or negative)   OSA:   She has severe OSA. She is currently on CPAP +8 cm. She tolerates it well. I looked at the download from the last 53 days. It showed 100% compliance and a very good AHI equals 3.7. However, there was more than expected leak with 21.3 L/m.   The sleep study 2009 showed an AHI equals 38 with oxygen desaturations to 76%.   She notes that her mask sometimes leaks. She does not think that she snores through the mask.   She uses a FF mask and notes breathing through her mouth  Gait/strength/sensation/coordination:   Gait is slightly better.   Her new home does not have stairs and she does not tire out as much getting about.   Ampyra helped but the co-pay was very high at $400 and she cannot afford that. She does not think that she could afford the compounded drug (which is usually about $60 a month).    Balance is poor affecting gait.  She often uses a cane for balance.    Her legs are both mildly weak.   She has most trouble with changes in surface or with curbs.   . She denies weakness in the arms. She denies any numbness in the arms or legs. She has mild leg clumsiness. Ampyra has helped her gait and she has been on x 2 years.       Bladder/bowel: She has urinary urgency, frequency and nocturia (twice a night). Ditropan XL 10 mg has helped but she just takes now and then since starting the new CPAP machine.    At most she has one x nocturia.     Vision: She feels her vision is fine.  Fatigue/sleep: She has physical and mental fatigue.  Fatigue is better on Tysabri than previous treatments.  She feels less fstigue and sleepiness with her CPAP machine.   Ampyra helps her gait endurance as she is able to walk a little further with it.  Phentermine helps as much as Adderall but makes her feel hot at times.    She has insomnia, both sleep onset and sleep maintenance.   Mood/Cognition:   She still has some crying spells and sometimes inappropriate crying.    She could not afford Nuedexta.    Her boyfriend died in a motorcycle accident last year.    She has done grief counseling.   She feels her cognitive fog is reduced since being on Tysabri and with stimulants.       MS history: She was diagnosed in September 2012 after presenting with diplopia. She was hospitalized and received 5 days of IV steroids and she had MRI and lumbar puncture. She recalls that the lumbar puncture was not consistent with MS but the MRI was.  She was placed on Gilenya in December 2012. She was able to return to work and the diplopia completely resolved. Then in July 2014 she had another exacerbation with visual disturbance bilaterally, gait changes and cognitive changes.  Reportedly, there was a new lesion in the brainstem and also in the spinal cord at that time. She received 5 more days of steroid.    In early May 2016, she had the onset of several symptoms including worse cognitive problems, worse gait problems and weakness in the hands.   [facility]/2014 and she was evaluated. An admitted and received 3 days of IV Solu-Medrol.The MRI of the brain shows many white matter lesions. Some are periventricular with an appearance consistent with MS. She also has deep white matter subcortical and juxtacortical foci. Some of these look more consistent with small vessel ischemic changes and most more  consistent with multiple sclerosis. She does have new foci in the left vermis of the cerebellum and in the left frontal periventricular region.[name]    REVIEW OF SYSTEMS: Constitutional: No fevers, chills, sweats, or change in appetite.   She has fatigue Eyes: No visual changes, double vision, eye pain Ear, nose and throat: No hearing loss, ear pain, nasal congestion, sore throat Cardiovascular: No chest pain, palpitations Respiratory: No shortness of breath at rest or with exertion.   No wheezes GastrointestinaI: No nausea, vomiting, diarrhea, abdominal pain, fecal incontinence Genitourinary: No dysuria, urinary retention.   Frequency better on medications. Musculoskeletal: No neck pain, back pain Integumentary: No rash, pruritus, skin lesions Neurological: as above Psychiatric: No depression at this time.  No anxiety Endocrine: No palpitations, diaphoresis, change in appetite, change in weigh or increased  thirst Hematologic/Lymphatic: No anemia, purpura, petechiae. Allergic/Immunologic: No itchy/runny eyes, nasal congestion, recent allergic reactions, rashes  ALLERGIES: Allergies  Allergen Reactions  . Oxycodone Anaphylaxis    HOME MEDICATIONS:  Current Outpatient Medications:  .  acetaminophen (TYLENOL) 500 MG tablet, Take 500 mg by mouth every 6 (six) hours as needed for mild pain., Disp: , Rfl:  .  ALPRAZolam (XANAX) 0.5 MG tablet, Take 0.5 mg by mouth 2 (two) times daily as needed for anxiety., Disp: , Rfl:  .  amLODipine (NORVASC) 10 MG tablet, Take 10 mg by mouth daily., Disp: , Rfl:  .  aspirin EC 81 MG EC tablet, Take 1 tablet (81 mg total) by mouth daily., Disp: 30 tablet, Rfl: 0 .  atorvastatin (LIPITOR) 80 MG tablet, Take 1 tablet (80 mg total) by mouth daily at 6 PM., Disp: 30 tablet, Rfl: 0 .  carvedilol (COREG) 25 MG tablet, Take 25 mg by mouth 2 (two) times daily with a meal., Disp: , Rfl:  .  clopidogrel (PLAVIX) 75 MG tablet, Take 1 tablet (75 mg total) by mouth  daily., Disp: 30 tablet, Rfl: 0 .  escitalopram (LEXAPRO) 20 MG tablet, Take 20 mg by mouth daily., Disp: , Rfl:  .  hydrALAZINE (APRESOLINE) 50 MG tablet, Take 50 mg by mouth 2 (two) times daily., Disp: , Rfl:  .  lisinopril (PRINIVIL,ZESTRIL) 40 MG tablet, Take 40 mg by mouth daily., Disp: , Rfl:  .  natalizumab (TYSABRI) 300 MG/15ML injection, Inject 15 mLs into the vein every 28 (twenty-eight) days. , Disp: , Rfl:   PAST MEDICAL HISTORY: Past Medical History:  Diagnosis Date  . Complication of anesthesia    hard time waking up   . Hearing loss   . Hypertension   . Kidney stones   . Multiple sclerosis (HCC)   . Sleep apnea    Uses CPAP  . Stroke (HCC) 03/2017  . Vision abnormalities     PAST SURGICAL HISTORY: Past Surgical History:  Procedure Laterality Date  . CESAREAN SECTION  1986  . CHOLECYSTECTOMY  2000   laparoscopic  . IR FLUORO GUIDED NEEDLE PLC ASPIRATION/INJECTION LOC  04/15/2017  . RADIOLOGY WITH ANESTHESIA N/A 04/15/2017   Procedure: LUMBER PUNCTURE MRI SCAN;  Surgeon: Radiologist, Medication, MD;  Location: [facility] OR;  Service: Radiology;  Laterality: N/A;  . TEE WITHOUT CARDIOVERSION N/A 04/16/2017   Procedure: TRANSESOPHAGEAL ECHOCARDIOGRAM (TEE) WITH LOOP;  Surgeon: Wendall Stade, MD;  Location: [facility] ENDOSCOPY;  Service: Cardiovascular;  Laterality: N/A;    FAMILY HISTORY: Family History  Problem Relation Age of Onset  . Fibromyalgia Mother   . Stroke Father   . Diabetes Father   . Stroke Other     SOCIAL HISTORY:  Social History   Socioeconomic History  . Marital status: Single    Spouse name: Not on file  . Number of children: Not on file  . Years of education: Not on file  . Highest education level: Not on file  Occupational History  . Occupation: disabled  Social Needs  . Financial resource strain: Not on file  . Food insecurity:    Worry: Not on file    Inability: Not on file  . Transportation needs:    Medical: Not on file     Non-medical: Not on file  Tobacco Use  . Smoking status: Never Smoker  . Smokeless tobacco: Never Used  Substance and Sexual Activity  . Alcohol use: Yes    Comment: Occasional  . Drug use:  No  . Sexual activity: Not on file  Lifestyle  . Physical activity:    Days per week: Not on file    Minutes per session: Not on file  . Stress: Not on file  Relationships  . Social connections:    Talks on phone: Not on file    Gets together: Not on file    Attends religious service: Not on file    Active member of club or organization: Not on file    Attends meetings of clubs or organizations: Not on file    Relationship status: Not on file  . Intimate partner violence:    Fear of current or ex partner: Not on file    Emotionally abused: Not on file    Physically abused: Not on file    Forced sexual activity: Not on file  Other Topics Concern  . Not on file  Social History Narrative  . Not on file     PHYSICAL EXAM  Vitals:   08/06/17 1253  BP: 133/80  Pulse: 64  Resp: 20  Weight: 273 lb (123.8 kg)  Height: 5\' 9"  (1.753 m)    Body mass index is 40.32 kg/m.   General: The patient is well-developed and well-nourished and in no acute distress  Neurologic Exam  Mental status: The patient is alert and oriented x 3 at the time of the examination.   Her affect is improved and she shows less depression and anxiety today.  She is alert and oriented.  She follows three-step commands.  She can read and do simple math.  Focus and attention are better today.  She has a mild expressive aphasia with some word finding difficulties but is improved she was able to name most items I pointed to correctly.  Cranial nerves: Extraocular movements are full.  Facial strength and sensation is normal.  Trapezius strength is normal.  Minimal dysarthria.  The tongue is midline, and the patient has  elevation of the soft palate.   Motor:  Muscle bulk is normal.  Muscle tone is normal in the arms are  mildly increased in the left leg.  Strength is 5/5.  Sensory: Sensory testing shows intact touch sensation in the arms and legs  Coordination: Cerebellar testing reveals good finger-nose-finger reduced left heel to shin n .  Gait and station: Station is normal.  The gait is wide and she has difficulty with tandem walk..    Romberg is negative.   Reflexes: Deep tendon reflexes are symmetric and slightly increased in legs.       DIAGNOSTIC DATA (LABS, IMAGING, TESTING) - I reviewed patient records, labs, notes, testing and imaging myself where available.  Lab Results  Component Value Date   WBC 9.3 05/07/2017   HGB 13.1 05/07/2017   HCT 41.3 05/07/2017   MCV 91 05/07/2017   PLT 281 05/07/2017      Component Value Date/Time   NA 144 05/07/2017 1140   K 3.9 05/07/2017 1140   CL 105 05/07/2017 1140   CO2 22 05/07/2017 1140   GLUCOSE 89 05/07/2017 1140   GLUCOSE 107 (H) 04/19/2017 0540   BUN 19 05/07/2017 1140   CREATININE 1.00 05/07/2017 1140   CALCIUM 9.3 05/07/2017 1140   PROT 6.4 05/07/2017 1140   ALBUMIN 4.2 05/07/2017 1140   AST 14 05/07/2017 1140   ALT 20 05/07/2017 1140   ALKPHOS 101 05/07/2017 1140   BILITOT 0.5 05/07/2017 1140   GFRNONAA 64 05/07/2017 1140   GFRAA 73 05/07/2017  1140      ASSESSMENT AND PLAN  Multiple sclerosis (HCC) - Plan: Stratify JCV Antibody Test ([facility]), CBC with Differential/Platelet  Cerebrovascular accident (CVA) due to thrombosis of precerebral artery (HCC)  Obstructive sleep apnea on CPAP  Gait disturbance  Aphasia  High risk medication use - Plan: Stratify JCV Antibody Test ([facility]), CBC with Differential/Platelet  Attention deficit  Pedal edema - Plan: Compression stockings   1.   She will continue Tysabri.    JCV Ab was negative earlier this year we will recheck. 2.   Continue plavix plus aspirin 3.   Advised to use a cane or walker for safety.    4.   She could have bariatric surgery -- would need to try to limit  time off plavix/asa.  If successful, she may be other stop CPAP 5.   She will return in 6 months for f/u with me or call sooner if problems     Richard A. Epimenio Foot, MD, PhD, FAAN Certified in Neurology, Clinical Neurophysiology, Sleep Medicine, Pain Medicine and Neuroimaging Director, Multiple Sclerosis [facility] at [facility]  [facility] [address], [address] [place], [place] [address] [phone]

## 2017-08-07 LAB — CBC WITH DIFFERENTIAL/PLATELET
BASOS: 0 %
Basophils Absolute: 0 10*3/uL (ref 0.0–0.2)
EOS (ABSOLUTE): 0.3 10*3/uL (ref 0.0–0.4)
Eos: 3 %
Hematocrit: 39.2 % (ref 34.0–46.6)
Hemoglobin: 12.9 g/dL (ref 11.1–15.9)
IMMATURE GRANS (ABS): 0.1 10*3/uL (ref 0.0–0.1)
IMMATURE GRANULOCYTES: 1 %
LYMPHS: 29 %
Lymphocytes Absolute: 3.1 10*3/uL (ref 0.7–3.1)
MCH: 29.6 pg (ref 26.6–33.0)
MCHC: 32.9 g/dL (ref 31.5–35.7)
MCV: 90 fL (ref 79–97)
MONOS ABS: 0.5 10*3/uL (ref 0.1–0.9)
Monocytes: 5 %
NEUTROS PCT: 62 %
Neutrophils Absolute: 6.7 10*3/uL (ref 1.4–7.0)
Platelets: 263 10*3/uL (ref 150–450)
RBC: 4.36 x10E6/uL (ref 3.77–5.28)
RDW: 15.1 % (ref 12.3–15.4)
WBC: 10.7 10*3/uL (ref 3.4–10.8)

## 2017-08-12 ENCOUNTER — Encounter: Payer: Self-pay | Admitting: *Deleted

## 2017-08-13 ENCOUNTER — Telehealth: Payer: Self-pay | Admitting: Neurology

## 2017-08-13 NOTE — Telephone Encounter (Signed)
Pt has called to inform RN Faith that she went to PCP on Tues of this week, was told Potassium and Vitamin D were low and she has been advised to go back on Vitamin D supplement.  After pt's stroke Dr Epimenio Foot took her off Vitamin D supplement.  Pt wants to know if it is okay to go back on.  Please call

## 2017-08-13 NOTE — Telephone Encounter (Signed)
Spoke with Rachel Gilbert and per RAS, advised ok to take otc Vit. D 5,000iu once daily.  She verbalized understanding of same/fim

## 2017-08-14 ENCOUNTER — Encounter (HOSPITAL_COMMUNITY)
Admission: RE | Admit: 2017-08-14 | Discharge: 2017-08-14 | Disposition: A | Discharge status: Home / Self Care | Payer: Medicare HMO | Source: Ambulatory Visit | Attending: Neurology | Admitting: Neurology

## 2017-08-14 ENCOUNTER — Encounter (HOSPITAL_COMMUNITY): Payer: Self-pay

## 2017-08-14 DIAGNOSIS — G35 Multiple sclerosis: Secondary | ICD-10-CM | POA: Diagnosis not present

## 2017-08-14 MED ORDER — SODIUM CHLORIDE 0.9 % IV SOLN
INTRAVENOUS | Status: DC
  Administered 2017-08-14: 09:00:00 via INTRAVENOUS

## 2017-08-14 MED ORDER — SODIUM CHLORIDE 0.9 % IV SOLN
300.0000 mg | INTRAVENOUS | Status: DC
  Administered 2017-08-14: 300 mg via INTRAVENOUS
  Filled 2017-08-14: qty 15

## 2017-08-14 NOTE — Discharge Instructions (Signed)

## 2017-09-11 ENCOUNTER — Encounter (HOSPITAL_COMMUNITY): Payer: Self-pay

## 2017-09-11 ENCOUNTER — Encounter (HOSPITAL_COMMUNITY)
Admission: RE | Admit: 2017-09-11 | Discharge: 2017-09-11 | Disposition: A | Discharge status: Home / Self Care | Payer: Medicare HMO | Source: Ambulatory Visit | Attending: Neurology | Admitting: Neurology

## 2017-09-11 DIAGNOSIS — G35 Multiple sclerosis: Secondary | ICD-10-CM | POA: Insufficient documentation

## 2017-09-11 MED ORDER — SODIUM CHLORIDE 0.9 % IV SOLN
INTRAVENOUS | Status: DC
  Administered 2017-09-11: 11:00:00 via INTRAVENOUS

## 2017-09-11 MED ORDER — SODIUM CHLORIDE 0.9 % IV SOLN
300.0000 mg | INTRAVENOUS | Status: DC
  Administered 2017-09-11: 300 mg via INTRAVENOUS
  Filled 2017-09-11: qty 15

## 2017-10-09 ENCOUNTER — Telehealth: Payer: Self-pay | Admitting: Neurology

## 2017-10-09 NOTE — Telephone Encounter (Signed)
Noted/fim 

## 2017-10-09 NOTE — Telephone Encounter (Signed)
Pt is bringing a form on Monday to be filled out for clearance from Dr Epimenio Foot to have bariatric surgery. She is wanting the form mailed back to her, address on file confirmed.  FYI

## 2017-10-12 ENCOUNTER — Encounter (HOSPITAL_COMMUNITY)
Admission: RE | Admit: 2017-10-12 | Discharge: 2017-10-12 | Disposition: A | Discharge status: Home / Self Care | Payer: Medicare HMO | Source: Ambulatory Visit | Attending: Neurology | Admitting: Neurology

## 2017-10-12 ENCOUNTER — Encounter (HOSPITAL_COMMUNITY): Payer: Self-pay

## 2017-10-12 DIAGNOSIS — G35 Multiple sclerosis: Secondary | ICD-10-CM | POA: Insufficient documentation

## 2017-10-12 MED ORDER — SODIUM CHLORIDE 0.9 % IV SOLN
300.0000 mg | INTRAVENOUS | Status: DC
  Administered 2017-10-12: 300 mg via INTRAVENOUS
  Filled 2017-10-12: qty 15

## 2017-10-12 MED ORDER — SODIUM CHLORIDE 0.9 % IV SOLN
INTRAVENOUS | Status: AC
  Administered 2017-10-12: 08:00:00 via INTRAVENOUS

## 2017-10-20 ENCOUNTER — Encounter (HOSPITAL_COMMUNITY): Payer: Medicare HMO

## 2017-11-05 ENCOUNTER — Telehealth: Payer: Self-pay | Admitting: Neurology

## 2017-11-05 NOTE — Telephone Encounter (Signed)
Pt is asking for RN Faith to call her to see if she is able to determine if she has ever had an Endoscopy.  Please call

## 2017-11-05 NOTE — Telephone Encounter (Signed)
Spoke with Rachel Gilbert. She is going to have bariatric surgery, sts. she has to have an endoscopy prior to the surgery and is trying to determine if ins. will cover this.  I have explained the surgeon's office or GI would  check for ins. coverage for this procedure. She verbalized understanding of same/fim

## 2017-11-10 ENCOUNTER — Other Ambulatory Visit: Payer: Self-pay | Admitting: Surgery

## 2017-11-10 DIAGNOSIS — G4733 Obstructive sleep apnea (adult) (pediatric): Secondary | ICD-10-CM

## 2017-11-11 ENCOUNTER — Encounter (HOSPITAL_COMMUNITY): Payer: Self-pay

## 2017-11-11 ENCOUNTER — Encounter (HOSPITAL_COMMUNITY)
Admission: RE | Admit: 2017-11-11 | Discharge: 2017-11-11 | Disposition: A | Discharge status: Home / Self Care | Payer: Medicare HMO | Source: Ambulatory Visit | Attending: Neurology | Admitting: Neurology

## 2017-11-11 DIAGNOSIS — G35 Multiple sclerosis: Secondary | ICD-10-CM | POA: Diagnosis not present

## 2017-11-11 MED ORDER — SODIUM CHLORIDE 0.9 % IV SOLN
INTRAVENOUS | Status: DC
  Administered 2017-11-11: 12:00:00 via INTRAVENOUS

## 2017-11-11 MED ORDER — SODIUM CHLORIDE 0.9 % IV SOLN
300.0000 mg | INTRAVENOUS | Status: DC
  Administered 2017-11-11: 300 mg via INTRAVENOUS
  Filled 2017-11-11: qty 15

## 2017-11-11 NOTE — Discharge Instructions (Signed)

## 2017-11-12 ENCOUNTER — Other Ambulatory Visit: Payer: Self-pay | Admitting: Surgery

## 2017-11-12 DIAGNOSIS — G4733 Obstructive sleep apnea (adult) (pediatric): Secondary | ICD-10-CM

## 2017-11-17 ENCOUNTER — Encounter (HOSPITAL_COMMUNITY): Payer: Medicare HMO

## 2017-11-24 ENCOUNTER — Ambulatory Visit
Admission: RE | Admit: 2017-11-24 | Discharge: 2017-11-24 | Disposition: A | Discharge status: Home / Self Care | Payer: Medicare HMO | Source: Ambulatory Visit | Attending: Surgery | Admitting: Surgery

## 2017-11-24 DIAGNOSIS — G4733 Obstructive sleep apnea (adult) (pediatric): Secondary | ICD-10-CM | POA: Diagnosis present

## 2017-11-24 DIAGNOSIS — K760 Fatty (change of) liver, not elsewhere classified: Secondary | ICD-10-CM | POA: Insufficient documentation

## 2017-12-11 ENCOUNTER — Encounter (HOSPITAL_COMMUNITY): Payer: Self-pay

## 2017-12-11 ENCOUNTER — Encounter (HOSPITAL_COMMUNITY)
Admission: RE | Admit: 2017-12-11 | Discharge: 2017-12-11 | Disposition: A | Discharge status: Home / Self Care | Payer: Medicare HMO | Source: Ambulatory Visit | Attending: Neurology | Admitting: Neurology

## 2017-12-11 DIAGNOSIS — G35 Multiple sclerosis: Secondary | ICD-10-CM | POA: Diagnosis present

## 2017-12-11 MED ORDER — SODIUM CHLORIDE 0.9 % IV SOLN
INTRAVENOUS | Status: DC
  Administered 2017-12-11: 08:00:00 via INTRAVENOUS

## 2017-12-11 MED ORDER — SODIUM CHLORIDE 0.9 % IV SOLN
300.0000 mg | INTRAVENOUS | Status: DC
  Administered 2017-12-11: 300 mg via INTRAVENOUS
  Filled 2017-12-11: qty 15

## 2017-12-14 ENCOUNTER — Telehealth: Payer: Self-pay | Admitting: Neurology

## 2017-12-14 NOTE — Telephone Encounter (Signed)
Patient has infusions scheduled for 02-05-18 and 03-05-18 and needs to get authorization from her insurance.  Patient went to her PCP this morning and wants to know if she can have tetanus-whooping cough vaccine, pneumonia vaccine and shingles vaccine which is a dead virus. Please call and advise.

## 2017-12-14 NOTE — Telephone Encounter (Signed)
I called pt back. States authorization for Tysabri good until end of December. Advised we will work on PA once new year starts. We can always try to do proactive request if needed. She is aware to get dead virus of vaccine. She got flu shot recently. Nothing further needed.

## 2017-12-15 ENCOUNTER — Encounter (HOSPITAL_COMMUNITY): Payer: Medicare HMO

## 2017-12-21 ENCOUNTER — Telehealth: Payer: Self-pay | Admitting: *Deleted

## 2017-12-21 NOTE — Telephone Encounter (Signed)
Faxed completed/signed Tysabri pt status report and reauth questionnaire to [employer] at [phone]. Received confirmation.  

## 2017-12-21 NOTE — Telephone Encounter (Signed)
Received fax notification from [employer] prescribing program that pt re-authorized from 12/21/17-07/23/18. Patient enrollment number: [id number]. Account: [facility]. Site Auth #: [id number]. [address] [place] [address].

## 2017-12-22 ENCOUNTER — Telehealth: Payer: Self-pay | Admitting: *Deleted

## 2017-12-22 NOTE — Telephone Encounter (Signed)
Submitted pro-active PA request on CMM. Pendergraft: [id number]. Waiting on determination.

## 2017-12-23 DIAGNOSIS — Z8673 Personal history of transient ischemic attack (TIA), and cerebral infarction without residual deficits: Secondary | ICD-10-CM | POA: Insufficient documentation

## 2017-12-28 DIAGNOSIS — Z01818 Encounter for other preprocedural examination: Secondary | ICD-10-CM | POA: Insufficient documentation

## 2017-12-30 NOTE — Telephone Encounter (Signed)
Received approval letter from [facility] that Tysabri approved.  Faxed approval letter to the [facility] short stay at [phone]. Received fax confirmation.

## 2018-01-01 ENCOUNTER — Telehealth: Payer: Self-pay | Admitting: Neurology

## 2018-01-01 NOTE — Telephone Encounter (Signed)
Pt requesting a call stating she has questions regarding her upcoming infusion appt

## 2018-01-01 NOTE — Telephone Encounter (Signed)
Left vm for patient that she can have tysabri infusion next Friday per Dr. Epimenio Foot. He reviewed her telephone note and states its okay.

## 2018-01-01 NOTE — Telephone Encounter (Signed)
Spoke with Dr. Epimenio Foot- ok for her to proceed with Tysabri infusion Friday

## 2018-01-01 NOTE — Telephone Encounter (Signed)
I called patient back. She is going to have bariatric surgery. However, next Thursday she is scheduled to have lexiscan stress test because she failed the bike test. She could not get up off the bike and she thinks her legs were weaker that day d/t MS. She was also on antibiotic this week-amoxicillin and prednisone that she finished that yesterday. She had bronchitis.  She feels better.  She wanted to make sure she could still proceed with Tysabri infusion on Friday with all this going on. Advised I will check with Dr. Epimenio Foot and call back to let her know. She verbalized understanding.

## 2018-01-01 NOTE — Telephone Encounter (Signed)
If patient calls back she can have the tysabri infusion per Dr. Epimenio Foot he reviewed the telephone note.

## 2018-01-05 NOTE — Telephone Encounter (Signed)
I have spoken with Darl Pikes and per RAS, advised ok to stop Plavix and ASA 5 days prior to bariatric surgery; resume following surgery/fim

## 2018-01-05 NOTE — Telephone Encounter (Signed)
Pts PCP requesting a call at [phone] to discuss the pts upcoming surgery. Please advise.

## 2018-01-05 NOTE — Telephone Encounter (Signed)
Darl Pikes with pts PCP called stating with Dr. Bonnita Hollow recommendations of the pts plavix they are wanting his ok to stop medication for the surgery.

## 2018-01-05 NOTE — Telephone Encounter (Signed)
Spoke with Cordelia Pen at [employer] is looking for clearance for pt. to stop Plavix prior to pt's gastric bypass surgery, but it appears the rx'ing physician is Dr. Berton Mount; I have advised she call his office/fim

## 2018-01-08 ENCOUNTER — Encounter (HOSPITAL_COMMUNITY): Payer: Self-pay

## 2018-01-08 ENCOUNTER — Encounter (HOSPITAL_COMMUNITY)
Admission: RE | Admit: 2018-01-08 | Discharge: 2018-01-08 | Disposition: A | Discharge status: Home / Self Care | Payer: Medicare HMO | Source: Ambulatory Visit | Attending: Neurology | Admitting: Neurology

## 2018-01-08 DIAGNOSIS — G35 Multiple sclerosis: Secondary | ICD-10-CM | POA: Insufficient documentation

## 2018-01-08 MED ORDER — SODIUM CHLORIDE 0.9 % IV SOLN
INTRAVENOUS | Status: DC
  Administered 2018-01-08: 12:00:00 via INTRAVENOUS

## 2018-01-08 MED ORDER — SODIUM CHLORIDE 0.9 % IV SOLN
300.0000 mg | INTRAVENOUS | Status: DC
  Administered 2018-01-08: 300 mg via INTRAVENOUS
  Filled 2018-01-08: qty 15

## 2018-01-08 NOTE — Progress Notes (Addendum)
Pt presents today for her Tysabri infusion.  Pt states she had bronchitis and was on Amoxicillian.  She finished her last dose last Thursday.  Pt states she called office to find out if she could go ahead with infusion today.  Per notes in the computer from 01/01/18 ok to transfuse today.

## 2018-01-12 ENCOUNTER — Encounter (HOSPITAL_COMMUNITY): Payer: Medicare HMO

## 2018-01-12 NOTE — Telephone Encounter (Signed)
Patient states she got a letter from insurance company regarding Tysabri infusion approval. She has been approved for Tysabri from 02-11-18 through 08-12-18 so her infusion appointment at [facility] needs to be changed. It is scheduled for 02-05-17.

## 2018-01-12 NOTE — Telephone Encounter (Signed)
I spoke with Rachel Gilbert and confirmed active dates for Tysabri PA below are 08/10/17-02/10/18. I spoke with pt. and let her know--she wanted to make sure her pending infusion on 02/05/17 was authorized.  Auth# [id number]

## 2018-01-12 NOTE — Telephone Encounter (Signed)
Previous authorization was good until 02-10-18. Auth # [id number]. Faith, RN called pt to let her know. Nothing further needed.

## 2018-01-21 ENCOUNTER — Encounter: Payer: Self-pay | Admitting: *Deleted

## 2018-01-21 ENCOUNTER — Telehealth: Payer: Self-pay | Admitting: Neurology

## 2018-01-21 NOTE — Telephone Encounter (Signed)
Pt requesting a call stating she is needing a letter stating she has MS and had a stroke within the last year. Stating she has gotten a jury summons needing the letter to provide medical proof on opting out.

## 2018-01-21 NOTE — Telephone Encounter (Signed)
Letter printed, waiting on MD signature °

## 2018-01-21 NOTE — Telephone Encounter (Signed)
Called, LVM letting pt know letter ready for pick up.

## 2018-01-29 ENCOUNTER — Encounter: Payer: Self-pay | Admitting: Emergency Medicine

## 2018-01-29 ENCOUNTER — Emergency Department
Admission: EM | Admit: 2018-01-29 | Discharge: 2018-01-29 | Disposition: A | Discharge status: Home / Self Care | Payer: Medicare HMO | Attending: Student in an Organized Health Care Education/Training Program | Admitting: Student in an Organized Health Care Education/Training Program

## 2018-01-29 ENCOUNTER — Emergency Department: Payer: Medicare HMO

## 2018-01-29 ENCOUNTER — Other Ambulatory Visit: Payer: Self-pay

## 2018-01-29 DIAGNOSIS — I10 Essential (primary) hypertension: Secondary | ICD-10-CM | POA: Diagnosis not present

## 2018-01-29 DIAGNOSIS — K5900 Constipation, unspecified: Secondary | ICD-10-CM | POA: Insufficient documentation

## 2018-01-29 LAB — URINALYSIS, COMPLETE (UACMP) WITH MICROSCOPIC
Bilirubin Urine: NEGATIVE
Glucose, UA: NEGATIVE mg/dL
Hgb urine dipstick: NEGATIVE
KETONES UR: 20 mg/dL — AB
Leukocytes, UA: NEGATIVE
Nitrite: NEGATIVE
PROTEIN: 30 mg/dL — AB
Specific Gravity, Urine: 1.025 (ref 1.005–1.030)
pH: 5 (ref 5.0–8.0)

## 2018-01-29 LAB — COMPREHENSIVE METABOLIC PANEL
ALBUMIN: 4 g/dL (ref 3.5–5.0)
ALK PHOS: 76 U/L (ref 38–126)
ALT: 30 U/L (ref 0–44)
ANION GAP: 10 (ref 5–15)
AST: 23 U/L (ref 15–41)
BILIRUBIN TOTAL: 1.6 mg/dL — AB (ref 0.3–1.2)
BUN: 24 mg/dL — AB (ref 6–20)
CALCIUM: 8.8 mg/dL — AB (ref 8.9–10.3)
CO2: 23 mmol/L (ref 22–32)
Chloride: 106 mmol/L (ref 98–111)
Creatinine, Ser: 1.02 mg/dL — ABNORMAL HIGH (ref 0.44–1.00)
GFR calc Af Amer: >60 mL/min (ref >60)
GFR calc non Af Amer: >60 mL/min (ref >60)
GLUCOSE: 121 mg/dL — AB (ref 70–99)
Potassium: 3.4 mmol/L — ABNORMAL LOW (ref 3.5–5.1)
SODIUM: 139 mmol/L (ref 135–145)
TOTAL PROTEIN: 6.2 g/dL — AB (ref 6.5–8.1)

## 2018-01-29 LAB — CBC
HCT: 35.7 % — ABNORMAL LOW (ref 36.0–46.0)
HEMOGLOBIN: 11.8 g/dL — AB (ref 12.0–15.0)
MCH: 29.4 pg (ref 26.0–34.0)
MCHC: 33.1 g/dL (ref 30.0–36.0)
MCV: 88.8 fL (ref 80.0–100.0)
PLATELETS: 286 10*3/uL (ref 150–400)
RBC: 4.02 MIL/uL (ref 3.87–5.11)
RDW: 15.1 % (ref 11.5–15.5)
WBC: 13.3 10*3/uL — ABNORMAL HIGH (ref 4.0–10.5)
nRBC: 0.2 % (ref 0.0–0.2)

## 2018-01-29 LAB — LIPASE, BLOOD: Lipase: 30 U/L (ref 11–51)

## 2018-01-29 MED ORDER — LACTULOSE 10 GM/15ML PO SOLN
30.0000 g | Freq: Once | ORAL | Status: AC
  Administered 2018-01-29: 30 g via ORAL
  Filled 2018-01-29: qty 60

## 2018-01-29 MED ORDER — LACTULOSE 10 G PO PACK: 10.0000 g | PACK | Freq: Three times a day (TID) | ORAL | 0 refills | Status: DC

## 2018-01-29 NOTE — Discharge Instructions (Signed)
Follow-up with Dr. Smitty Cords.  I do recommend you try mixing 1 tablespoon of mineral oil with 8 ounces of prune juice once daily to try and help with constipation.  I have also prescribed you lactulose which is a very effective stool softener that you can use up to 3 times daily.  Return for worsening pain, fevers, nausea vomiting or for any worsening symptoms, questions or concerns.    You have been seen in the emergency department for emergency care. It is important that you contact your own doctor, specialist or the closest clinic for follow-up care. Please bring this instruction sheet, all medications and X-ray copies with you when you are seen for follow-up care.  Determining the exact cause for all patients with abdominal pain is extremely difficult in the emergency department. Our primary focus is to rule-out immediate life-threatening diseases. If no immediate source of pain is found the definitive diagnosis frequently needs to be determined over time.Many times your primary care physician can determine the cause by following the symptoms over time. Sometimes, specialist are required such as Gastroenterologists, Gynecologists, Urologists or Surgeons. Please return immediately to the Emergency Department for fever>101, Vomiting or Intractable Pain. You should return to the emergency department or see your primary care provider in 12-24hrs if your pain is no better and sooner if your pain becomes worse.

## 2018-01-29 NOTE — ED Provider Notes (Signed)
[facility] Emergency Department Provider Note    First MD Initiated Contact with Patient 01/29/18 1119     (approximate)  I have reviewed the triage vital signs and the nursing notes.   HISTORY  Chief Complaint Constipation    HPI Rachel Gilbert is a 58 y.o. female status post recent duodenal switch done by Dr. Smitty CordsBruce presents the ER for crampy abdominal discomfort and difficulty moving her bowels.  States that she is been taking enema and suppositories.  Is on oral Dilaudid for pain.  Denies any fevers.  No nausea or vomiting.  Has been able to eat and drink.      Past Medical History:  Diagnosis Date  . Complication of anesthesia    hard time waking up   . Hearing loss   . Hypertension   . Kidney stones   . Multiple sclerosis (HCC)   . Sleep apnea    Uses CPAP  . Stroke (HCC) 03/2017  . Vision abnormalities    Family History  Problem Relation Age of Onset  . Fibromyalgia Mother   . Stroke Father   . Diabetes Father   . Stroke Other    Past Surgical History:  Procedure Laterality Date  . CESAREAN SECTION  1986  . CHOLECYSTECTOMY  2000   laparoscopic  . IR FLUORO GUIDED NEEDLE PLC ASPIRATION/INJECTION LOC  04/15/2017  . LAPAROSCOPIC GASTRIC RESTRICTIVE DUODENAL PROCEDURE (DUODENAL SWITCH)    . RADIOLOGY WITH ANESTHESIA N/A 04/15/2017   Procedure: LUMBER PUNCTURE MRI SCAN;  Surgeon: Radiologist, Medication, MD;  Location: [facility] OR;  Service: Radiology;  Laterality: N/A;  . TEE WITHOUT CARDIOVERSION N/A 04/16/2017   Procedure: TRANSESOPHAGEAL ECHOCARDIOGRAM (TEE) WITH LOOP;  Surgeon: Wendall StadeNishan, Peter C, MD;  [facility];  Service: Cardiovascular;  Laterality: N/A;   Patient Active Problem List   Diagnosis Date Noted  . Pedal edema 08/06/2017  . High risk medication use 06/05/2017  . Abnormal laboratory test result 06/05/2017  . Aphasia 05/07/2017  . Dysphagia   . CVA (cerebral vascular accident) (HCC) 04/13/2017  . TIA (transient  ischemic attack) 04/08/2017  . Attention deficit 11/06/2016  . 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 Insomnia 11/16/2014  . Other fatigue 07/03/2014  . Urinary frequency 07/03/2014  . Gait disturbance 06/14/2014  . Depression with anxiety 06/14/2014  . Vitamin D deficiency 06/14/2014  . Multiple sclerosis exacerbation (HCC) 06/04/2014  . Essential hypertension 06/04/2014  . Obesity 06/04/2014  . Obstructive sleep apnea on CPAP 06/04/2014  . Gastroesophageal reflux disease without esophagitis 06/04/2014  . Multiple sclerosis (HCC) 06/04/2014      Prior to Admission medications   Medication Sig Start Date End Date Taking? Authorizing Provider  acetaminophen (TYLENOL) 500 MG tablet Take 500 mg by mouth every 6 (six) hours as needed for mild pain.    [provider]  ALPRAZolam Prudy Feeler(XANAX) 0.5 MG tablet Take 0.5 mg by mouth 2 (two) times daily as needed for anxiety.    [provider]  amLODipine (NORVASC) 10 MG tablet Take 10 mg by mouth daily.    [provider]  aspirin EC 81 MG EC tablet Take 1 tablet (81 mg total) by mouth daily. 04/12/17   Milagros LollSudini, Srikar, MD  atorvastatin (LIPITOR) 80 MG tablet Take 1 tablet (80 mg total) by mouth daily at 6 PM. 04/21/17   Barnetta Chapelgbata, Sylvester I, MD  carvedilol (COREG) 25 MG tablet Take 25 mg by mouth 2 (two) times daily with a meal.    [provider]  clopidogrel (PLAVIX) 75 MG tablet Take 1 tablet (75 mg total) by mouth daily. 04/22/17   Berton Mount I, MD  escitalopram (LEXAPRO) 20 MG tablet Take 20 mg by mouth daily.    [provider]  hydrALAZINE (APRESOLINE) 50 MG tablet Take 50 mg by mouth 2 (two) times daily.    [provider]  lisinopril (PRINIVIL,ZESTRIL) 40 MG tablet Take 40 mg by mouth daily.    [provider]  natalizumab (TYSABRI) 300 MG/15ML injection Inject 15 mLs into the vein every 28 (twenty-eight) days.     [provider]    Allergies Oxycodone    Social  History Social History   Tobacco Use  . Smoking status: Never Smoker  . Smokeless tobacco: Never Used  Substance Use Topics  . Alcohol use: Yes    Comment: Occasional  . Drug use: No    Review of Systems Patient denies headaches, rhinorrhea, blurry vision, numbness, shortness of breath, chest pain, edema, cough, abdominal pain, nausea, vomiting, diarrhea, dysuria, fevers, rashes or hallucinations unless otherwise stated above in HPI. ____________________________________________   PHYSICAL EXAM:  VITAL SIGNS: Vitals:   01/29/18 1014  BP: 124/65  Pulse: 75  Temp: 98.7 F (37.1 C)  SpO2: 98%    Constitutional: Alert and oriented.  Eyes: Conjunctivae are normal.  Head: Atraumatic. Nose: No congestion/rhinnorhea. Mouth/Throat: Mucous membranes are moist.   Neck: No stridor. Painless ROM.  Cardiovascular: Normal rate, regular rhythm. Grossly normal heart sounds.  Good peripheral circulation. Respiratory: Normal respiratory effort.  No retractions. Lungs CTAB. Gastrointestinal: Soft, base with diffuse ecchymosis but no overlying warmth.  No focal tenderness to palpation.  Abdominal exam is quite benign in this postoperative state.  nO distention. No abdominal bruits. No CVA tenderness. Genitourinary: No external genitalia.  No fecal impaction.  No masses. Musculoskeletal: No lower extremity tenderness nor edema.  No joint effusions. Neurologic:  Normal speech and language. No gross focal neurologic deficits are appreciated. No facial droop Skin:  Skin is warm, dry and intact. No rash noted. Psychiatric: Mood and affect are normal. Speech and behavior are normal.  ____________________________________________   LABS (all labs ordered are listed, but only abnormal results are displayed)  Results for orders placed or performed during the hospital encounter of 01/29/18 (from the past 24 hour(s))  Lipase, blood     Status: None   Collection Time: 01/29/18 10:30 AM  Result Value  Ref Range   Lipase 30 11 - 51 U/L  Comprehensive metabolic panel     Status: Abnormal   Collection Time: 01/29/18 10:30 AM  Result Value Ref Range   Sodium 139 135 - 145 mmol/L   Potassium 3.4 (L) 3.5 - 5.1 mmol/L   Chloride 106 98 - 111 mmol/L   CO2 23 22 - 32 mmol/L   Glucose, Bld 121 (H) 70 - 99 mg/dL   BUN 24 (H) 6 - 20 mg/dL   Creatinine, Ser 6.96 (H) 0.44 - 1.00 mg/dL   Calcium 8.8 (L) 8.9 - 10.3 mg/dL   Total Protein 6.2 (L) 6.5 - 8.1 g/dL   Albumin 4.0 3.5 - 5.0 g/dL   AST 23 15 - 41 U/L   ALT 30 0 - 44 U/L   Alkaline Phosphatase 76 38 - 126 U/L   Total Bilirubin 1.6 (H) 0.3 - 1.2 mg/dL   GFR calc non Af Amer >60 >60 mL/min   GFR calc Af Amer >60 >60 mL/min   Anion gap 10 5 - 15  CBC     Status: Abnormal   Collection Time: 01/29/18 10:30 AM  Result Value Ref Range   WBC 13.3 (H) 4.0 - 10.5 K/uL   RBC 4.02 3.87 - 5.11 MIL/uL   Hemoglobin 11.8 (L) 12.0 - 15.0 g/dL   HCT 35.3 (L) 29.9 - 24.2 %   MCV 88.8 80.0 - 100.0 fL   MCH 29.4 26.0 - 34.0 pg   MCHC 33.1 30.0 - 36.0 g/dL   RDW 68.3 41.9 - 62.2 %   Platelets 286 150 - 400 K/uL   nRBC 0.2 0.0 - 0.2 %   ____________________________________________  ____________________________________________  RADIOLOGY  I personally reviewed all radiographic images ordered to evaluate for the above acute complaints and reviewed radiology reports and findings.  These findings were personally discussed with the patient.  Please see medical record for radiology report.  ____________________________________________   PROCEDURES  Procedure(s) performed:  Procedures    Critical Care performed: no ____________________________________________   INITIAL IMPRESSION / ASSESSMENT AND PLAN / ED COURSE  Pertinent labs & imaging results that were available during my care of the patient were reviewed by me and considered in my medical decision making (see chart for details).   DDX: Constipation, obstipation, SBO, perforation,  enteritis  Sharina Lapage is a 58 y.o. who presents to the ED with symptoms of severe constipation recent postoperative state.  Blood work is reassuring.  Does have a mild leukocytosis which I would expect in the postoperative state.  Her abdominal exam is soft and benign at this point in her symptoms seem to me primarily constipation.  No evidence of impaction.  Not consistent with SBO.  Not consistent with perforation.  Have discussed conservative management including ensuring good oral hydration, starting lactulose as well as trial of prune juice and close follow-up with her surgeon.  Patient was able to tolerate PO and was able to ambulate with a steady gait.  Have discussed with the patient and available family all diagnostics and treatments performed thus far and all questions were answered to the best of my ability. The patient demonstrates understanding and agreement with plan.        As part of my medical decision making, I reviewed the following data within the electronic MEDICAL RECORD NUMBER Nursing notes reviewed and incorporated, Labs reviewed, notes from prior ED visits.   ____________________________________________   FINAL CLINICAL IMPRESSION(S) / ED DIAGNOSES  Final diagnoses:  Constipation      NEW MEDICATIONS STARTED DURING THIS VISIT:  New Prescriptions   No medications on file     Note:  This document was prepared using Dragon voice recognition software and may include unintentional dictation errors.    Willy Eddy, MD 01/29/18 1145

## 2018-01-29 NOTE — ED Triage Notes (Signed)
Pt to ED from home c/o constipation.  States had duodenal switch last Friday, has not had a BM since Sunday when she left the hospital and has had no relief with stool softeners and laxatives at home.  Pt states pain to rectum.

## 2018-02-02 ENCOUNTER — Telehealth: Payer: Self-pay | Admitting: *Deleted

## 2018-02-02 NOTE — Telephone Encounter (Signed)
Received fax notification from [employer] that pt approved for patient access network foundation assistance. Patient ID: [id number]. Group #: [id number]. Effective 02/05/2018-02/05/2019. Expenses submitted until 04/06/2019. Amount available: 5600.00. Covered services: Tysabri.  Pt receives infusions at [facility] short stay.

## 2018-02-02 NOTE — Telephone Encounter (Signed)
Faxed new Tysabri orders to [facility] short stay at [phone]. Received fax confirmation. Tysbari 300mg /23ml IV q28 days.

## 2018-02-05 ENCOUNTER — Encounter (HOSPITAL_COMMUNITY)
Admission: RE | Admit: 2018-02-05 | Discharge: 2018-02-05 | Disposition: A | Discharge status: Home / Self Care | Payer: Medicare HMO | Source: Ambulatory Visit | Attending: Neurology | Admitting: Neurology

## 2018-02-05 ENCOUNTER — Encounter (HOSPITAL_COMMUNITY): Payer: Self-pay

## 2018-02-05 ENCOUNTER — Telehealth: Payer: Self-pay | Admitting: *Deleted

## 2018-02-05 DIAGNOSIS — G35 Multiple sclerosis: Secondary | ICD-10-CM | POA: Insufficient documentation

## 2018-02-05 MED ORDER — SODIUM CHLORIDE 0.9 % IV SOLN
300.0000 mg | INTRAVENOUS | Status: DC
  Administered 2018-02-05: 300 mg via INTRAVENOUS
  Filled 2018-02-05: qty 15

## 2018-02-05 MED ORDER — SODIUM CHLORIDE 0.9 % IV SOLN
INTRAVENOUS | Status: DC
  Administered 2018-02-05: 10:00:00 via INTRAVENOUS

## 2018-02-05 NOTE — Progress Notes (Signed)
Spoke with Marcelino Duster with Dr Bonnita Hollow office to inform of recent surgical procedure pt underwent approx 2 wks ago / Duodenal Switch. Dr Epimenio Foot out of office today but Dr Mayer Masker paged. To return call back in regards to clearance.

## 2018-02-05 NOTE — Telephone Encounter (Signed)
Received a call from [place] at [facility].  The patient had arrived for her Tysabri infusion and the nurse had a question.  I was transferred to the nurse Archie Patten) who stated the patient had duodenal switch bariatric surgery two weeks ago.  She wanted to make sure it was okay for the patient to be infused.  Per vo by Dr. Terrace Arabia, they can proceed with the Tysabri infusion.

## 2018-02-10 ENCOUNTER — Ambulatory Visit: Payer: Medicare HMO | Admitting: Neurology

## 2018-02-24 ENCOUNTER — Encounter: Payer: Self-pay | Admitting: Neurology

## 2018-02-24 ENCOUNTER — Telehealth: Payer: Self-pay | Admitting: *Deleted

## 2018-02-24 ENCOUNTER — Ambulatory Visit: Payer: Medicare HMO | Admitting: Neurology

## 2018-02-24 VITALS — BP 125/77 | HR 64 | Ht 68.0 in | Wt 240.5 lb

## 2018-02-24 DIAGNOSIS — R350 Frequency of micturition: Secondary | ICD-10-CM

## 2018-02-24 DIAGNOSIS — G4733 Obstructive sleep apnea (adult) (pediatric): Secondary | ICD-10-CM

## 2018-02-24 DIAGNOSIS — G35 Multiple sclerosis: Secondary | ICD-10-CM | POA: Diagnosis not present

## 2018-02-24 DIAGNOSIS — I6300 Cerebral infarction due to thrombosis of unspecified precerebral artery: Secondary | ICD-10-CM | POA: Diagnosis not present

## 2018-02-24 DIAGNOSIS — Z9989 Dependence on other enabling machines and devices: Secondary | ICD-10-CM

## 2018-02-24 DIAGNOSIS — R5383 Other fatigue: Secondary | ICD-10-CM

## 2018-02-24 DIAGNOSIS — R269 Unspecified abnormalities of gait and mobility: Secondary | ICD-10-CM

## 2018-02-24 DIAGNOSIS — Z79899 Other long term (current) drug therapy: Secondary | ICD-10-CM

## 2018-02-24 NOTE — Telephone Encounter (Signed)
Placed [facility] lab in [facility] lock box for routine lab pick up.  

## 2018-02-24 NOTE — Progress Notes (Signed)
[facility]  PATIENT: Rachel Gilbert DOB: Feb 22, 1960  REFERRING DOCTOR OR PCP:  ER  No PCP SOURCE: ER notes, images on PACS, patient  _________________________________   HISTORICAL  CHIEF COMPLAINT:  Chief Complaint  Patient presents with  . Follow-up    RM 12, alone. Last seen 08/06/17. Had bariatric surgery on 01/22/18. Did not have gastric sleeve. She ended up doing duondenal switch.   . Multiple Sclerosis    On Tysabri. Last JVC checked: 08/06/17, Indeterminate 0.22. NEEDS REPEAT JCV TODAY. Last infusion: 02/05/2018. Receives infusions at [facility] short stay.  . Gait Problem    No falls since she was last here.   . Labs Only    Pt requesting potassium level be re-checked today. It was elevated 01/29/18    HISTORY OF PRESENT ILLNESS:  Rachel Gilbert is 58 y.o. woman with MS and OSA.    Update 02/24/2018: She is on Tysabri and she tolerates it well.   Her last infusion was 02/05/18.    She denies any exacerbation.   Her gait is baseline.  No falls.  No new numbness or weakness.   She has urinary frequency and nocturia but no changes.   Vision is fine.   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    She is sleeping well.  She uses CPAP.  Mood is doing well.  She had a duodenal switch gastric bypass bariatric procedure and she is feeling well.   She has lost 21 pounds since the surgery.   She has had constipation since surgery and is on stool softeners.   She had a stroke March 2019.   She has some expressive aphasia that has improved.    This is often frustrating.  She is on Plavix plus aspirin.   No new TIA or stroke symptoms.    Update 08/06/2017: She feels her MS has been stable.  She is on Tysabri 300 mg every 4 weeks.  She tolerates it well.  Her JCV antibody is negative.  (0.22)  She has not had any more strokelike symptoms.  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  She still has mild aphasia.  Because she was on Tysabri, she also had a lumbar puncture to rule out PML.  CT angiogram showed occlusion of 1 of the MCA branches.  TEE did not show a PFO or other source of emboli.  Carotid Dopplers did not show significant stenosis.  She was discharged on aspirin Plavix and continues on these.  She is doing about the same with her gait.   She stumbles but no recent falls.   Hr arm and leg strength are bout the same.     She is considering bariatric surgery.  She has OSA and uses CPAP nightly.    She still has some insomnia and rarely takes xanax if still awake after a while.    Update 05/07/2017: She was hospitalized at [facility] between 04/12/2017 and 04/21/2017 due to aphasia due to a left MCA distribution stroke. She also had some  There was a somewhat stuttering course and she first presented a few days earlier to an outside hospital and was  Treated with 3 days of IV Solu-Medrol with the thought that she might be having an exacerbation.  When symptoms worsen on 04/12/2017 she presented to [facility].  I have  reviewed imaging studies and laboratory results from that hospitalization.  Because she is on Tysabri, there was initially some concern that the stroke could represent PML.  However, she underwent a lumbar puncture in the CSF did not show any evidence of JCV by PCR.  Additionally, CT angiogram appears to show occlusion of 1 of the MCA branches.     She has been placed on aspirin and then Plavix was added.  She had a TEE but no evidence of a PFO or other source of emboli.  Doppler study showed less than 50% stenosis.  A loop recorder is being considered.    Risk factors:   HTN (on 3 med's), OSA, no DM, no smoking, lipids ok (though Lipitor was added).   .   For her MS, she continues on Tysabri.  Last infusion was 04/29/2017.    She tolerates it well and has not had any definite exacerbations since starting.    She has some gait  issues but this is the same as before the stroke.    She has more anxiety, helped by escitalopram.  Update 11/06/2016:    She feels her MS is doing well. She is on Tysabri. She tolerates it well and has not had any definite exacerbations. Her last JCV antibody (05/07/2016) was negative at 0.15.  She denies any new symptoms.   Her gait is the same but she notes more left calf cramps.   Gait is wore on an uneven surface and climbing a few steps can be difficult.   She did have one fall when she bent down to pick up an ice cube that fell.   She did not hurt her head.     Her arms feels strong but the legs feels heavy (R=L) .  She denies numbness.    She notes frequency and urgency and rare urge incontinence.    Vision is doing well.   Fatigue and sleepiness persist to some extent, even with CPAP.  She feels a second wind in the late mornings .  She stopped phentermine because it did not help weight loss and did not help focus too much.  Many years ago she was o Ritalin with benefit for focus and attention, fatigue and weight loss..    She has severe OSA and uses CPAP nightly (+8 cm)                                                                                                                                                                                       _________________________________________ From 05/07/2016:  MS:   She has been on Tysabri since 05/2014.   Last JCV Ab  was negative (0.18 10/2016) but she was low positive JCV Ab positive in the past.   We discussed the risks and benefits of Tysabri (about 1:2000 risk of PML assuming she fluctuates from low positive or negative)   OSA:   She has severe OSA. She is currently on CPAP +8 cm. She tolerates it well. I looked at the download from the last 53 days. It showed 100% compliance and a very good AHI equals 3.7. However, there was more than expected leak with 21.3 L/m.   The sleep study 2009 showed an AHI equals 38 with oxygen desaturations to 76%.    She notes that her mask sometimes leaks. She does not think that she snores through the mask.   She uses a FF mask and notes breathing through her mouth  Gait/strength/sensation/coordination:   Gait is slightly better.   Her new home does not have stairs and she does not tire out as much getting about.   Ampyra helped but the co-pay was very high at $400 and she cannot afford that. She does not think that she could afford the compounded drug (which is usually about $60 a month).    Balance is poor affecting gait.    She often uses a cane for balance.    Her legs are both mildly weak.   She has most trouble with changes in surface or with curbs.   . She denies weakness in the arms. She denies any numbness in the arms or legs. She has mild leg clumsiness. Ampyra has helped her gait and she has been on x 2 years.       Bladder/bowel: She has urinary urgency, frequency and nocturia (twice a night). Ditropan XL 10 mg has helped but she just takes now and then since starting the new CPAP machine.   At most she has one x nocturia.     Vision: She feels her vision is fine.  Fatigue/sleep: She has physical and mental fatigue.  Fatigue is better on Tysabri than previous treatments.  She feels less fstigue and sleepiness with her CPAP machine.   Ampyra helps her gait endurance as she is able to walk a little further with it.  Phentermine helps as much as Adderall but makes her feel hot at times.    She has insomnia, both sleep onset and sleep maintenance.   Mood/Cognition:   She still has some crying spells and sometimes inappropriate crying.    She could not afford Nuedexta.    Her boyfriend died in a motorcycle accident last year.    She has done grief counseling.   She feels her cognitive fog is reduced since being on Tysabri and with stimulants.       MS history: She was diagnosed in September 2012 after presenting with diplopia. She was hospitalized and received 5 days of IV steroids and she had MRI and  lumbar puncture. She recalls that the lumbar puncture was not consistent with MS but the MRI was.  She was placed on Gilenya in December 2012. She was able to return to work and the diplopia completely resolved. Then in July 2014 she had another exacerbation with visual disturbance bilaterally, gait changes and cognitive changes.  Reportedly, there was a new lesion in the brainstem and also in the spinal cord at that time. She received 5 more days of steroid.    In early May 2016, she had the onset of several symptoms including worse cognitive problems, worse gait problems and  weakness in the hands.   She went to the [facility] emergency room on 06/03/2014 and she was evaluated. An admitted and received 3 days of IV Solu-Medrol.The MRI of the brain shows many white matter lesions. Some are periventricular with an appearance consistent with MS. She also has deep white matter subcortical and juxtacortical foci. Some of these look more consistent with small vessel ischemic changes and most more consistent with multiple sclerosis. She does have new foci in the left vermis of the cerebellum and in the left frontal periventricular region.[name]    REVIEW OF SYSTEMS: Constitutional: No fevers, chills, sweats, or change in appetite.   She has fatigue Eyes: No visual changes, double vision, eye pain Ear, nose and throat: No hearing loss, ear pain, nasal congestion, sore throat Cardiovascular: No chest pain, palpitations Respiratory: No shortness of breath at rest or with exertion.   No wheezes GastrointestinaI: No nausea, vomiting, diarrhea, abdominal pain, fecal incontinence Genitourinary: No dysuria, urinary retention.   Frequency better on medications. Musculoskeletal: No neck pain, back pain Integumentary: No rash, pruritus, skin lesions Neurological: as above Psychiatric: No depression at this time.  No anxiety Endocrine: No palpitations, diaphoresis, change in appetite, change in weigh or increased  thirst Hematologic/Lymphatic: No anemia, purpura, petechiae. Allergic/Immunologic: No itchy/runny eyes, nasal congestion, recent allergic reactions, rashes  ALLERGIES: Allergies  Allergen Reactions  . Oxycodone Anaphylaxis    HOME MEDICATIONS:  Current Outpatient Medications:  .  acetaminophen (TYLENOL) 500 MG tablet, Take 500 mg by mouth every 6 (Gilbert) hours as needed for mild pain., Disp: , Rfl:  .  ALPRAZolam (XANAX) 0.5 MG tablet, Take 0.5 mg by mouth 2 (two) times daily as needed for anxiety., Disp: , Rfl:  .  aspirin EC 81 MG EC tablet, Take 1 tablet (81 mg total) by mouth daily., Disp: 30 tablet, Rfl: 0 .  atorvastatin (LIPITOR) 80 MG tablet, Take 1 tablet (80 mg total) by mouth daily at 6 PM., Disp: 30 tablet, Rfl: 0 .  Calcium-Vitamin D (CALTRATE 600 PLUS-VIT D PO), Take 4 tablets by mouth daily., Disp: , Rfl:  .  carvedilol (COREG) 25 MG tablet, Take 25 mg by mouth 2 (two) times daily with a meal., Disp: , Rfl:  .  clopidogrel (PLAVIX) 75 MG tablet, Take 1 tablet (75 mg total) by mouth daily., Disp: 30 tablet, Rfl: 0 .  escitalopram (LEXAPRO) 20 MG tablet, Take 20 mg by mouth daily., Disp: , Rfl:  .  hydrALAZINE (APRESOLINE) 50 MG tablet, Take 50 mg by mouth 2 (two) times daily., Disp: , Rfl:  .  lactulose (CEPHULAC) 10 g packet, Take 1 packet (10 g total) by mouth 3 (three) times daily., Disp: 30 each, Rfl: 0 .  Multiple Vitamins-Minerals (MULTIVITAMIN ADULT PO), Take 2 tablets by mouth every morning. Advanced multi-Bariatric vit, Disp: , Rfl:  .  natalizumab (TYSABRI) 300 MG/15ML injection, Inject 15 mLs into the vein every 28 (twenty-eight) days. , Disp: , Rfl:  .  ondansetron (ZOFRAN-ODT) 4 MG disintegrating tablet, Take 4 mg by mouth every 8 (eight) hours as needed for nausea or vomiting., Disp: , Rfl:  .  pantoprazole (PROTONIX) 20 MG tablet, Take 20 mg by mouth daily., Disp: , Rfl:  .  Sennosides-Docusate Sodium (STOOL SOFTENER/LAXATIVE PO), Take 1 tablet by mouth daily.,  Disp: , Rfl:  No current facility-administered medications for this visit.   Facility-Administered Medications Ordered in Other Visits:  .  0.9 %  sodium chloride infusion, , Intravenous, Q28 days, Maxwel Meadowcroft,  Pearletha Furlichard A, MD, Stopped at 02/05/18 1208 .  natalizumab (TYSABRI) 300 mg in sodium chloride 0.9 % 100 mL IVPB, 300 mg, Intravenous, Q28 days, Sanjuan Sawa, Pearletha Furlichard A, MD, Stopped at 02/05/18 1200  PAST MEDICAL HISTORY: Past Medical History:  Diagnosis Date  . Complication of anesthesia    hard time waking up   . Hearing loss   . Hypertension   . Kidney stones   . Multiple sclerosis (HCC)   . Sleep apnea    Uses CPAP  . Stroke (HCC) 03/2017  . Vision abnormalities     PAST SURGICAL HISTORY: Past Surgical History:  Procedure Laterality Date  . CESAREAN SECTION  1986  . CHOLECYSTECTOMY  2000   laparoscopic  . IR FLUORO GUIDED NEEDLE PLC ASPIRATION/INJECTION LOC  04/15/2017  . LAPAROSCOPIC GASTRIC RESTRICTIVE DUODENAL PROCEDURE (DUODENAL SWITCH)    . RADIOLOGY WITH ANESTHESIA N/A 04/15/2017   Procedure: LUMBER PUNCTURE MRI SCAN;  Surgeon: Radiologist, Medication, MD;  Location: [facility] OR;  Service: Radiology;  Laterality: N/A;  . TEE WITHOUT CARDIOVERSION N/A 04/16/2017   Procedure: TRANSESOPHAGEAL ECHOCARDIOGRAM (TEE) WITH LOOP;  Surgeon: Wendall StadeNishan, Peter C, MD;  [facility];  Service: Cardiovascular;  Laterality: N/A;    FAMILY HISTORY: Family History  Problem Relation Age of Onset  . Fibromyalgia Mother   . Stroke Father   . Diabetes Father   . Stroke Other     SOCIAL HISTORY:  Social History   Socioeconomic History  . Marital status: Single    Spouse name: Not on file  . Number of children: Not on file  . Years of education: Not on file  . Highest education level: Not on file  Occupational History  . Occupation: disabled  Social Needs  . Financial resource strain: Not on file  . Food insecurity:    Worry: Not on file    Inability: Not on file  . Transportation  needs:    Medical: Not on file    Non-medical: Not on file  Tobacco Use  . Smoking status: Never Smoker  . Smokeless tobacco: Never Used  Substance and Sexual Activity  . Alcohol use: Yes    Comment: Occasional  . Drug use: No  . Sexual activity: Not on file  Lifestyle  . Physical activity:    Days per week: Not on file    Minutes per session: Not on file  . Stress: Not on file  Relationships  . Social connections:    Talks on phone: Not on file    Gets together: Not on file    Attends religious service: Not on file    Active member of club or organization: Not on file    Attends meetings of clubs or organizations: Not on file    Relationship status: Not on file  . Intimate partner violence:    Fear of current or ex partner: Not on file    Emotionally abused: Not on file    Physically abused: Not on file    Forced sexual activity: Not on file  Other Topics Concern  . Not on file  Social History Narrative  . Not on file     PHYSICAL EXAM  Vitals:   02/24/18 1249  BP: 125/77  Pulse: 64  Weight: 240 lb 8 oz (109.1 kg)  Height: 5\' 8"  (1.727 m)    Body mass index is 36.57 kg/m.   General: The patient is well-developed and well-nourished and in no acute distress  Neurologic Exam  Mental status: The  patient is alert and oriented x 3 at the time of the examination.   Her affect is improved and she shows less depression and anxiety today.  She is alert and oriented.  She follows three-step commands.  She can read and do simple math.  Focus and attention are better today.  She has a mild expressive aphasia with some word finding difficulties but is improved she was able to name most items I pointed to correctly.  Cranial nerves: Extraocular movements are full.  Facial strength and sensation is normal.  Trapezius strength is normal.  Minimal dysarthria.  The tongue is midline, and the patient has  elevation of the soft palate.   Motor:  Muscle bulk is normal.  Muscle  tone is normal in the arms are mildly increased in the left leg.  Strength is 5/5.  Sensory: Sensory testing shows intact touch sensation in the arms and legs  Coordination: Cerebellar testing reveals good finger-nose-finger reduced left heel to shin n .  Gait and station: Station is normal.  The gait is wide and she has difficulty with tandem walk..    Romberg is negative.   Reflexes: Deep tendon reflexes are symmetric and slightly increased in legs.       DIAGNOSTIC DATA (LABS, IMAGING, TESTING) - I reviewed patient records, labs, notes, testing and imaging myself where available.  Lab Results  Component Value Date   WBC 13.3 (H) 01/29/2018   HGB 11.8 (L) 01/29/2018   HCT 35.7 (L) 01/29/2018   MCV 88.8 01/29/2018   PLT 286 01/29/2018      Component Value Date/Time   NA 139 01/29/2018 1030   NA 144 05/07/2017 1140   K 3.4 (L) 01/29/2018 1030   CL 106 01/29/2018 1030   CO2 23 01/29/2018 1030   GLUCOSE 121 (H) 01/29/2018 1030   BUN 24 (H) 01/29/2018 1030   BUN 19 05/07/2017 1140   CREATININE 1.02 (H) 01/29/2018 1030   CALCIUM 8.8 (L) 01/29/2018 1030   PROT 6.2 (L) 01/29/2018 1030   PROT 6.4 05/07/2017 1140   ALBUMIN 4.0 01/29/2018 1030   ALBUMIN 4.2 05/07/2017 1140   AST 23 01/29/2018 1030   ALT 30 01/29/2018 1030   ALKPHOS 76 01/29/2018 1030   BILITOT 1.6 (H) 01/29/2018 1030   BILITOT 0.5 05/07/2017 1140   GFRNONAA >60 01/29/2018 1030   GFRAA >60 01/29/2018 1030      ASSESSMENT AND PLAN  Multiple sclerosis (HCC) - Plan: Stratify JCV Antibody Test ([facility]), CBC with Differential/Platelet, Comprehensive metabolic panel  High risk medication use - Plan: Stratify JCV Antibody Test ([facility]), CBC with Differential/Platelet, Comprehensive metabolic panel  Cerebrovascular accident (CVA) due to thrombosis of precerebral artery (HCC)  Obstructive sleep apnea on CPAP  Gait disturbance  Urinary frequency  Other fatigue   1.   She will continue Tysabri.    JCV Ab  was negative earlier this year we will recheck.   We discussed switching to Ocrevus if she converts from JCV negative to positive.     2.   Continue plavix plus aspirin 3.   If more tired, can use a cane.     4.  Continue CPAP but if she loses more weight, she may be able to stop CPAP 5.   She will return in 6 months for f/u with me or call sooner if problems     Nalleli Largent A. Epimenio Foot, MD, PhD, FAAN Certified in Neurology, Clinical Neurophysiology, Sleep Medicine, Pain Medicine and Neuroimaging Director, Multiple Sclerosis  [facility]  [facility] [address], [address] [place], [place] [address] [phone]

## 2018-02-25 ENCOUNTER — Telehealth: Payer: Self-pay | Admitting: *Deleted

## 2018-02-25 LAB — CBC WITH DIFFERENTIAL/PLATELET
BASOS: 1 %
Basophils Absolute: 0.1 10*3/uL (ref 0.0–0.2)
EOS (ABSOLUTE): 0.3 10*3/uL (ref 0.0–0.4)
EOS: 3 %
HEMATOCRIT: 36.1 % (ref 34.0–46.6)
Hemoglobin: 12 g/dL (ref 11.1–15.9)
IMMATURE GRANULOCYTES: 0 %
Immature Grans (Abs): 0 10*3/uL (ref 0.0–0.1)
LYMPHS ABS: 2.8 10*3/uL (ref 0.7–3.1)
Lymphs: 28 %
MCH: 30.1 pg (ref 26.6–33.0)
MCHC: 33.2 g/dL (ref 31.5–35.7)
MCV: 91 fL (ref 79–97)
MONOS ABS: 0.6 10*3/uL (ref 0.1–0.9)
Monocytes: 6 %
NEUTROS ABS: 6.1 10*3/uL (ref 1.4–7.0)
NEUTROS PCT: 62 %
PLATELETS: 221 10*3/uL (ref 150–450)
RBC: 3.99 x10E6/uL (ref 3.77–5.28)
RDW: 15.1 % (ref 11.7–15.4)
WBC: 9.9 10*3/uL (ref 3.4–10.8)

## 2018-02-25 LAB — COMPREHENSIVE METABOLIC PANEL
ALT: 43 IU/L — AB (ref 0–32)
AST: 22 IU/L (ref 0–40)
Albumin/Globulin Ratio: 2.7 — ABNORMAL HIGH (ref 1.2–2.2)
Albumin: 4.3 g/dL (ref 3.8–4.9)
Alkaline Phosphatase: 108 IU/L (ref 39–117)
BILIRUBIN TOTAL: 0.9 mg/dL (ref 0.0–1.2)
BUN/Creatinine Ratio: 19 (ref 9–23)
BUN: 19 mg/dL (ref 6–24)
CALCIUM: 9.6 mg/dL (ref 8.7–10.2)
CHLORIDE: 105 mmol/L (ref 96–106)
CO2: 19 mmol/L — ABNORMAL LOW (ref 20–29)
Creatinine, Ser: 1.02 mg/dL — ABNORMAL HIGH (ref 0.57–1.00)
GFR, EST AFRICAN AMERICAN: 71 mL/min/{1.73_m2} (ref >59)
GFR, EST NON AFRICAN AMERICAN: 62 mL/min/{1.73_m2} (ref >59)
GLOBULIN, TOTAL: 1.6 g/dL (ref 1.5–4.5)
Glucose: 90 mg/dL (ref 65–99)
POTASSIUM: 3.8 mmol/L (ref 3.5–5.2)
SODIUM: 143 mmol/L (ref 134–144)
Total Protein: 5.9 g/dL — ABNORMAL LOW (ref 6.0–8.5)

## 2018-02-25 NOTE — Telephone Encounter (Signed)
LMOM with below lab results.  She does not need to return this call unless she has questions/fim 

## 2018-02-25 NOTE — Telephone Encounter (Signed)
-----   Message from Asa Lente, MD sent at 02/25/2018 11:25 AM EST ----- Please let the patient know that the lab work is fine.

## 2018-03-01 ENCOUNTER — Telehealth: Payer: Self-pay | Admitting: Pharmacist

## 2018-03-01 NOTE — Telephone Encounter (Signed)
I received a call from a rep named Vista Lawman T at [place] ( ph# [phone]) asking if pt was enrolled in pt assistance for Tysabri for date of service 01/08/18 given at [facility]. According to EMR, it appears that pt was authorized through [place] touch for Tysabri during 12/21/17 - 07/23/18. I gave Vista Lawman T the contact info for Eilene Ghazi, RN at [facility] to discuss further with Kara Mead as she had documented pt assistance info. I also gave Vista Lawman T the contact number for [place] touch program.  Ebony Hail, Pharm.D., CPP 03/01/2018@2 :10 PM [facility] [phone]

## 2018-03-02 NOTE — Telephone Encounter (Signed)
I had already discussed with them yesterday. I have them contact info [facility] short stay to see if pt was receiving pt assistance for Tysabri. They have not called our office back.

## 2018-03-03 NOTE — Telephone Encounter (Signed)
JCV indeterminate, titer 0.23. Inhibition assay: negative. Drawn on 02/24/18.

## 2018-03-05 ENCOUNTER — Encounter (HOSPITAL_COMMUNITY): Payer: Self-pay

## 2018-03-05 ENCOUNTER — Encounter (HOSPITAL_COMMUNITY)
Admission: RE | Admit: 2018-03-05 | Discharge: 2018-03-05 | Disposition: A | Discharge status: Home / Self Care | Payer: Medicare HMO | Source: Ambulatory Visit | Attending: Neurology | Admitting: Neurology

## 2018-03-05 DIAGNOSIS — G35 Multiple sclerosis: Secondary | ICD-10-CM | POA: Insufficient documentation

## 2018-03-05 MED ORDER — SODIUM CHLORIDE 0.9 % IV SOLN
INTRAVENOUS | Status: DC
  Administered 2018-03-05: 09:00:00 via INTRAVENOUS

## 2018-03-05 MED ORDER — SODIUM CHLORIDE 0.9 % IV SOLN
300.0000 mg | INTRAVENOUS | Status: DC
  Administered 2018-03-05: 300 mg via INTRAVENOUS
  Filled 2018-03-05: qty 15

## 2018-03-23 DIAGNOSIS — Z9884 Bariatric surgery status: Secondary | ICD-10-CM | POA: Insufficient documentation

## 2018-04-02 ENCOUNTER — Encounter (HOSPITAL_COMMUNITY): Payer: Self-pay

## 2018-04-02 ENCOUNTER — Encounter (HOSPITAL_COMMUNITY)
Admission: RE | Admit: 2018-04-02 | Discharge: 2018-04-02 | Disposition: A | Discharge status: Home / Self Care | Payer: Medicare HMO | Source: Ambulatory Visit | Attending: Neurology | Admitting: Neurology

## 2018-04-02 ENCOUNTER — Other Ambulatory Visit: Payer: Self-pay

## 2018-04-02 DIAGNOSIS — G35 Multiple sclerosis: Secondary | ICD-10-CM | POA: Diagnosis not present

## 2018-04-02 MED ORDER — SODIUM CHLORIDE 0.9 % IV SOLN
INTRAVENOUS | Status: DC
  Administered 2018-04-02: 10:00:00 via INTRAVENOUS

## 2018-04-02 MED ORDER — SODIUM CHLORIDE 0.9 % IV SOLN
300.0000 mg | INTRAVENOUS | Status: DC
  Administered 2018-04-02: 300 mg via INTRAVENOUS
  Filled 2018-04-02: qty 15

## 2018-04-02 NOTE — Progress Notes (Signed)
Pt. Understanding of drug and post reactions; has had multiple infusions.  Pt. Does not want to stay for 1 hr oberservation post infusion time.

## 2018-04-06 ENCOUNTER — Telehealth: Payer: Self-pay | Admitting: Neurology

## 2018-04-06 NOTE — Telephone Encounter (Signed)
pt has called for the intrafusion suite, call transferred °

## 2018-04-13 NOTE — Telephone Encounter (Signed)
Lexie/Cotviti [phone] call to verify how the medication was supplied to the patient on 1/10. Message below relayed. She was appreciative

## 2018-04-13 NOTE — Telephone Encounter (Signed)
Noted  

## 2018-04-30 ENCOUNTER — Encounter (HOSPITAL_COMMUNITY)
Admission: RE | Admit: 2018-04-30 | Discharge: 2018-04-30 | Disposition: A | Discharge status: Home / Self Care | Payer: Medicare HMO | Source: Ambulatory Visit | Attending: Neurology | Admitting: Neurology

## 2018-04-30 ENCOUNTER — Encounter (HOSPITAL_COMMUNITY): Payer: Self-pay

## 2018-04-30 ENCOUNTER — Other Ambulatory Visit: Payer: Self-pay

## 2018-04-30 DIAGNOSIS — G35 Multiple sclerosis: Secondary | ICD-10-CM | POA: Insufficient documentation

## 2018-04-30 MED ORDER — SODIUM CHLORIDE 0.9 % IV SOLN
300.0000 mg | INTRAVENOUS | Status: DC
  Administered 2018-04-30: 300 mg via INTRAVENOUS
  Filled 2018-04-30: qty 15

## 2018-04-30 MED ORDER — SODIUM CHLORIDE 0.9 % IV SOLN
INTRAVENOUS | Status: DC
  Administered 2018-04-30: 09:00:00 via INTRAVENOUS

## 2018-04-30 NOTE — Progress Notes (Signed)
Infusion complete. No post infusion reactions. Pt did not want to stay, stayed for 10 min flush post infusion.

## 2018-05-12 ENCOUNTER — Telehealth: Payer: Self-pay | Admitting: *Deleted

## 2018-05-12 NOTE — Telephone Encounter (Signed)
Received fax notification from touch prescribing program that pt authorized from 01/22/18-07/23/18.  Enrollment number: [id number]. Account: [facility]. Site auth number: [id number].

## 2018-05-26 ENCOUNTER — Other Ambulatory Visit: Payer: Self-pay | Admitting: Neurology

## 2018-05-26 MED ORDER — CLOPIDOGREL BISULFATE 75 MG PO TABS: 75.0000 mg | ORAL_TABLET | Freq: Every day | ORAL | 1 refills | Status: DC

## 2018-05-26 NOTE — Telephone Encounter (Signed)
Patient called in stating that Dr. Epimenio Foot informed her that he wanted to start prescribing her the Plavix. She stated that she is out of the medicine and would like a refill sent in to the [facility]. She also stated she would like 90 day supply.[name]

## 2018-05-28 ENCOUNTER — Encounter (HOSPITAL_COMMUNITY): Payer: Medicare HMO

## 2018-05-28 ENCOUNTER — Other Ambulatory Visit: Payer: Self-pay

## 2018-05-28 ENCOUNTER — Ambulatory Visit (HOSPITAL_COMMUNITY)
Admission: RE | Admit: 2018-05-28 | Discharge: 2018-05-28 | Disposition: A | Discharge status: Home / Self Care | Payer: Medicare HMO | Source: Ambulatory Visit | Attending: Neurology | Admitting: Neurology

## 2018-05-28 DIAGNOSIS — G35 Multiple sclerosis: Secondary | ICD-10-CM | POA: Diagnosis present

## 2018-05-28 MED ORDER — SODIUM CHLORIDE 0.9 % IV SOLN
300.0000 mg | INTRAVENOUS | Status: DC
  Administered 2018-05-28: 300 mg via INTRAVENOUS
  Filled 2018-05-28: qty 15

## 2018-05-28 MED ORDER — SODIUM CHLORIDE 0.9 % IV SOLN
INTRAVENOUS | Status: DC
  Administered 2018-05-28: 12:00:00 via INTRAVENOUS

## 2018-05-28 NOTE — Progress Notes (Signed)
PATIENT CARE CENTER NOTE  Diagnosis: Multiple Sclerosis    Provider: Sater, Richard  MD   Procedure: Tysabri infusion    Note: Patient received Tysabri infusion. Tolerated well with no adverse reaction. Vital signs remained stable. Patient did not want  to remain for the 1 hour post-infusion. Alert, oriented and ambulatory at discharge.               

## 2018-05-28 NOTE — Discharge Instructions (Signed)
Natalizumab injection What is this medicine? NATALIZUMAB (na ta LIZ you mab) is used to treat relapsing multiple sclerosis. This drug is not a cure. It is also used to treat Crohn's disease. This medicine may be used for other purposes; ask your health care provider or pharmacist if you have questions. COMMON BRAND NAME(S): Tysabri What should I tell my health care provider before I take this medicine? They need to know if you have any of these conditions: -immune system problems -progressive multifocal leukoencephalopathy (PML) -an unusual or allergic reaction to natalizumab, other medicines, foods, dyes, or preservatives -pregnant or trying to get pregnant -breast-feeding How should I use this medicine? This medicine is for infusion into a vein. It is given by a health care professional in a hospital or clinic setting. A special MedGuide will be given to you by the pharmacist with each prescription and refill. Be sure to read this information carefully each time. Talk to your pediatrician regarding the use of this medicine in children. This medicine is not approved for use in children. Overdosage: If you think you have taken too much of this medicine contact a poison control center or emergency room at once. NOTE: This medicine is only for you. Do not share this medicine with others. What if I miss a dose? It is important not to miss your dose. Call your doctor or health care professional if you are unable to keep an appointment. What may interact with this medicine? -azathioprine -cyclosporine -interferon -6-mercaptopurine -methotrexate -steroid medicines like prednisone or cortisone -TNF-alpha inhibitors like adalimumab, etanercept, and infliximab -vaccines This list may not describe all possible interactions. Give your health care provider a list of all the medicines, herbs, non-prescription drugs, or dietary supplements you use. Also tell them if you smoke, drink alcohol, or use  illegal drugs. Some items may interact with your medicine. What should I watch for while using this medicine? Your condition will be monitored carefully while you are receiving this medicine. Visit your doctor for regular check ups. Tell your doctor or healthcare professional if your symptoms do not start to get better or if they get worse. Stay away from people who are sick. Call your doctor or health care professional for advice if you get a fever, chills or sore throat, or other symptoms of a cold or flu. Do not treat yourself. In some patients, this medicine may cause a serious brain infection that may cause death. If you have any problems seeing, thinking, speaking, walking, or standing, tell your doctor right away. If you cannot reach your doctor, get urgent medical care. What side effects may I notice from receiving this medicine? Side effects that you should report to your doctor or health care professional as soon as possible: -allergic reactions like skin rash, itching or hives, swelling of the face, lips, or tongue -breathing problems -changes in vision -chest pain -dark urine -depression, feelings of sadness -dizziness -general ill feeling or flu-like symptoms -irregular, missed, or painful menstrual periods -light-colored stools -loss of appetite, nausea -muscle weakness -problems with balance, talking, or walking -right upper belly pain -unusually weak or tired -yellowing of the eyes or skin Side effects that usually do not require medical attention (report to your doctor or health care professional if they continue or are bothersome): -aches, pains -headache -stomach upset -tiredness This list may not describe all possible side effects. Call your doctor for medical advice about side effects. You may report side effects to FDA at 1-800-FDA-1088. Where should I keep   my medicine? This drug is given in a hospital or clinic and will not be stored at home. NOTE: This sheet is  a summary. It may not cover all possible information. If you have questions about this medicine, talk to your doctor, pharmacist, or health care provider.  2019 Elsevier/Gold Standard (2008-03-04 13:33:21)  

## 2018-06-08 ENCOUNTER — Telehealth: Payer: Self-pay | Admitting: Neurology

## 2018-06-08 NOTE — Telephone Encounter (Signed)
Pt would like a call back to be advised if she is able to come out to church and things like that due to the COVID-19. Pt states she is scared of going out. She has only gone out to [employer] for her infusion but she would like to be told by a provider that she is ok to go to other places safely. Please advise.

## 2018-06-08 NOTE — Telephone Encounter (Signed)
Called pt back. Since there is still no vaccine for covid-19, recommendations have note changed.  Advised it would be best for her to still stay home, avoid large groups and only go out if she has to. Make sure to follow CDC guidelines for coronavirus. She verbalized understanding.

## 2018-06-23 ENCOUNTER — Telehealth: Payer: Self-pay | Admitting: *Deleted

## 2018-06-23 NOTE — Telephone Encounter (Signed)
Received fax notification from touch prescribing program that pt authorized from 06/22/18-01/22/19.  Enrollment number: [id number]. Account: [facility]. Site auth number: [id number].

## 2018-07-02 ENCOUNTER — Ambulatory Visit (HOSPITAL_COMMUNITY)
Admission: RE | Admit: 2018-07-02 | Discharge: 2018-07-02 | Disposition: A | Discharge status: Home / Self Care | Payer: Medicare HMO | Source: Ambulatory Visit | Attending: Neurology | Admitting: Neurology

## 2018-07-02 ENCOUNTER — Encounter (HOSPITAL_COMMUNITY): Payer: Medicare HMO

## 2018-07-02 ENCOUNTER — Other Ambulatory Visit: Payer: Self-pay

## 2018-07-02 DIAGNOSIS — G35 Multiple sclerosis: Secondary | ICD-10-CM | POA: Insufficient documentation

## 2018-07-02 MED ORDER — SODIUM CHLORIDE 0.9 % IV SOLN
INTRAVENOUS | Status: DC | PRN
  Administered 2018-07-02: 250 mL via INTRAVENOUS

## 2018-07-02 MED ORDER — SODIUM CHLORIDE 0.9 % IV SOLN
300.0000 mg | Freq: Once | INTRAVENOUS | Status: AC
  Administered 2018-07-02: 300 mg via INTRAVENOUS
  Filled 2018-07-02: qty 15

## 2018-07-02 NOTE — Progress Notes (Signed)
Patient received Tysabri via a PIV. Patient refused to stay for the one hour observation post infusion. Tolerated well, vitals stable, discharge instructions given, verbalized understanding. Patient endorsed not taking her blood pressure medications before coming here today. Will take it when she gets home. Patient alert, oriented and ambulatory at the time of discharge.

## 2018-07-02 NOTE — Discharge Instructions (Signed)
Natalizumab injection What is this medicine? NATALIZUMAB (na ta LIZ you mab) is used to treat relapsing multiple sclerosis. This drug is not a cure. It is also used to treat Crohn's disease. This medicine may be used for other purposes; ask your health care provider or pharmacist if you have questions. COMMON BRAND NAME(S): Tysabri What should I tell my health care provider before I take this medicine? They need to know if you have any of these conditions: -immune system problems -progressive multifocal leukoencephalopathy (PML) -an unusual or allergic reaction to natalizumab, other medicines, foods, dyes, or preservatives -pregnant or trying to get pregnant -breast-feeding How should I use this medicine? This medicine is for infusion into a vein. It is given by a health care professional in a hospital or clinic setting. A special MedGuide will be given to you by the pharmacist with each prescription and refill. Be sure to read this information carefully each time. Talk to your pediatrician regarding the use of this medicine in children. This medicine is not approved for use in children. Overdosage: If you think you have taken too much of this medicine contact a poison control center or emergency room at once. NOTE: This medicine is only for you. Do not share this medicine with others. What if I miss a dose? It is important not to miss your dose. Call your doctor or health care professional if you are unable to keep an appointment. What may interact with this medicine? -azathioprine -cyclosporine -interferon -6-mercaptopurine -methotrexate -steroid medicines like prednisone or cortisone -TNF-alpha inhibitors like adalimumab, etanercept, and infliximab -vaccines This list may not describe all possible interactions. Give your health care provider a list of all the medicines, herbs, non-prescription drugs, or dietary supplements you use. Also tell them if you smoke, drink alcohol, or use  illegal drugs. Some items may interact with your medicine. What should I watch for while using this medicine? Your condition will be monitored carefully while you are receiving this medicine. Visit your doctor for regular check ups. Tell your doctor or healthcare professional if your symptoms do not start to get better or if they get worse. Stay away from people who are sick. Call your doctor or health care professional for advice if you get a fever, chills or sore throat, or other symptoms of a cold or flu. Do not treat yourself. In some patients, this medicine may cause a serious brain infection that may cause death. If you have any problems seeing, thinking, speaking, walking, or standing, tell your doctor right away. If you cannot reach your doctor, get urgent medical care. What side effects may I notice from receiving this medicine? Side effects that you should report to your doctor or health care professional as soon as possible: -allergic reactions like skin rash, itching or hives, swelling of the face, lips, or tongue -breathing problems -changes in vision -chest pain -dark urine -depression, feelings of sadness -dizziness -general ill feeling or flu-like symptoms -irregular, missed, or painful menstrual periods -light-colored stools -loss of appetite, nausea -muscle weakness -problems with balance, talking, or walking -right upper belly pain -unusually weak or tired -yellowing of the eyes or skin Side effects that usually do not require medical attention (report to your doctor or health care professional if they continue or are bothersome): -aches, pains -headache -stomach upset -tiredness This list may not describe all possible side effects. Call your doctor for medical advice about side effects. You may report side effects to FDA at 1-800-FDA-1088. Where should I keep   my medicine? This drug is given in a hospital or clinic and will not be stored at home. NOTE: This sheet is  a summary. It may not cover all possible information. If you have questions about this medicine, talk to your doctor, pharmacist, or health care provider.  2019 Elsevier/Gold Standard (2008-03-04 13:33:21)  

## 2018-08-02 ENCOUNTER — Telehealth: Payer: Self-pay | Admitting: *Deleted

## 2018-08-02 NOTE — Telephone Encounter (Signed)
Received fax notification from touch prescribing program that pt authorized from 07/24/18-01/22/19.  Enrollment number: [id number]. [facility].  Site auth number: [id number].

## 2018-08-04 ENCOUNTER — Emergency Department: Payer: Medicare HMO

## 2018-08-04 ENCOUNTER — Encounter: Payer: Self-pay | Admitting: Emergency Medicine

## 2018-08-04 ENCOUNTER — Emergency Department
Admission: EM | Admit: 2018-08-04 | Discharge: 2018-08-04 | Disposition: A | Discharge status: Home / Self Care | Payer: Medicare HMO | Attending: Emergency Medicine | Admitting: Emergency Medicine

## 2018-08-04 ENCOUNTER — Other Ambulatory Visit: Payer: Self-pay

## 2018-08-04 DIAGNOSIS — S40021A Contusion of right upper arm, initial encounter: Secondary | ICD-10-CM

## 2018-08-04 DIAGNOSIS — Z7982 Long term (current) use of aspirin: Secondary | ICD-10-CM | POA: Insufficient documentation

## 2018-08-04 DIAGNOSIS — Y929 Unspecified place or not applicable: Secondary | ICD-10-CM | POA: Diagnosis not present

## 2018-08-04 DIAGNOSIS — Z9049 Acquired absence of other specified parts of digestive tract: Secondary | ICD-10-CM | POA: Diagnosis not present

## 2018-08-04 DIAGNOSIS — Z79899 Other long term (current) drug therapy: Secondary | ICD-10-CM | POA: Insufficient documentation

## 2018-08-04 DIAGNOSIS — R2231 Localized swelling, mass and lump, right upper limb: Secondary | ICD-10-CM | POA: Insufficient documentation

## 2018-08-04 DIAGNOSIS — M7989 Other specified soft tissue disorders: Secondary | ICD-10-CM

## 2018-08-04 DIAGNOSIS — Y9389 Activity, other specified: Secondary | ICD-10-CM | POA: Insufficient documentation

## 2018-08-04 DIAGNOSIS — X58XXXA Exposure to other specified factors, initial encounter: Secondary | ICD-10-CM | POA: Insufficient documentation

## 2018-08-04 DIAGNOSIS — Z8673 Personal history of transient ischemic attack (TIA), and cerebral infarction without residual deficits: Secondary | ICD-10-CM | POA: Diagnosis not present

## 2018-08-04 DIAGNOSIS — I10 Essential (primary) hypertension: Secondary | ICD-10-CM | POA: Insufficient documentation

## 2018-08-04 DIAGNOSIS — S4991XA Unspecified injury of right shoulder and upper arm, initial encounter: Secondary | ICD-10-CM | POA: Diagnosis present

## 2018-08-04 DIAGNOSIS — Y998 Other external cause status: Secondary | ICD-10-CM | POA: Insufficient documentation

## 2018-08-04 DIAGNOSIS — S40022A Contusion of left upper arm, initial encounter: Secondary | ICD-10-CM

## 2018-08-04 DIAGNOSIS — Z7902 Long term (current) use of antithrombotics/antiplatelets: Secondary | ICD-10-CM | POA: Insufficient documentation

## 2018-08-04 NOTE — Discharge Instructions (Signed)
Your exam and ultrasound are normal. You do not have a blood clot in the upper arm. You have a bruise and superficial hematoma. This will disappear on its own. Follow-up with your provider as scheduled.

## 2018-08-04 NOTE — ED Triage Notes (Signed)
Sent from [facility] urgent care to r/o DVT to right upper arm.  Patient states she is on blood thinners and noticed the bruising and swelling to right upper arm today.  Patient is scared it is a blood clot.

## 2018-08-04 NOTE — ED Provider Notes (Signed)
[facility] Emergency Department Provider Note ____________________________________________  Time seen: 1711  I have reviewed the triage vital signs and the nursing notes.  HISTORY  Chief Complaint  Arm Injury  History as told to [place], PA-S Tyler Deis)  HPI Rachel Gilbert is a 58 y.o. female presents to the ED for evaluation of an area to the upper right arm, concerning to her for a blood clot. She is sent here from a local urgent care for further evaluation. She is on clopidogrel for a previous ischemic stroke. She has monthly infusions for her MS, but uses the opposite arm. She can not recall when or where she would have gotten this bruise. She is denying any chest pain, SOB, syncope, edema, or weakness.    Past Medical History:  Diagnosis Date  . Complication of anesthesia    hard time waking up   . Hearing loss   . Hypertension   . Kidney stones   . Multiple sclerosis ([place])   . Sleep apnea    Uses CPAP  . Stroke ([place]) 03/2017  . Vision abnormalities     Patient Active Problem List   Diagnosis Date Noted  . Pedal edema 08/06/2017  . High risk medication use 06/05/2017  . Abnormal laboratory test result 06/05/2017  . Aphasia 05/07/2017  . Dysphagia   . CVA (cerebral vascular accident) ([place]) 04/13/2017  . TIA (transient ischemic attack) 04/08/2017  . Attention deficit 11/06/2016  . 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 Insomnia 11/16/2014  . Other fatigue 07/03/2014  . Urinary frequency 07/03/2014  . Gait disturbance 06/14/2014  . Depression with anxiety 06/14/2014  . Vitamin D deficiency 06/14/2014  . Multiple sclerosis exacerbation ([place]) 06/04/2014  . Essential hypertension 06/04/2014  . Obesity 06/04/2014  . Obstructive sleep apnea on CPAP 06/04/2014  . Gastroesophageal reflux disease without esophagitis 06/04/2014  . Multiple sclerosis ([place]) 06/04/2014    Past Surgical History:  Procedure Laterality Date  . CESAREAN SECTION  1986  .  CHOLECYSTECTOMY  2000   laparoscopic  . IR FLUORO GUIDED NEEDLE PLC ASPIRATION/INJECTION LOC  04/15/2017  . LAPAROSCOPIC GASTRIC RESTRICTIVE DUODENAL PROCEDURE (DUODENAL SWITCH)    . RADIOLOGY WITH ANESTHESIA N/A 04/15/2017   Procedure: LUMBER PUNCTURE MRI SCAN;  Surgeon: Radiologist, Medication, MD;  Location: [facility];  Service: Radiology;  Laterality: N/A;  . TEE WITHOUT CARDIOVERSION N/A 04/16/2017   Procedure: TRANSESOPHAGEAL ECHOCARDIOGRAM (TEE) WITH LOOP;  Surgeon: Josue Hector, MD;  Location: [facility] ENDOSCOPY;  Service: Cardiovascular;  Laterality: N/A;    Prior to Admission medications   Medication Sig Start Date End Date Taking? Authorizing Provider  acetaminophen (TYLENOL) 500 MG tablet Take 500 mg by mouth every 6 (six) hours as needed for mild pain.    [provider]  ALPRAZolam Duanne Moron) 0.5 MG tablet Take 0.5 mg by mouth 2 (two) times daily as needed for anxiety.    [provider]  aspirin EC 81 MG EC tablet Take 1 tablet (81 mg total) by mouth daily. 04/12/17   Hillary Bow, MD  atorvastatin (LIPITOR) 80 MG tablet Take 1 tablet (80 mg total) by mouth daily at 6 PM. 04/21/17   Bonnell Public, MD  Calcium-Vitamin D (CALTRATE 600 PLUS-VIT D PO) Take 4 tablets by mouth daily.    [provider]  carvedilol (COREG) 25 MG tablet Take 25 mg by mouth 2 (two) times daily with a meal.    [provider]  clopidogrel (PLAVIX) 75 MG tablet Take 1 tablet (75  mg total) by mouth daily. 05/26/18   Sater, Pearletha Furlichard A, MD  escitalopram (LEXAPRO) 20 MG tablet Take 20 mg by mouth daily.    [provider]  hydrALAZINE (APRESOLINE) 50 MG tablet Take 50 mg by mouth 2 (two) times daily.    [provider]  lactulose (CEPHULAC) 10 g packet Take 1 packet (10 g total) by mouth 3 (three) times daily. 01/29/18   Willy Eddyobinson, Patrick, MD  Multiple Vitamins-Minerals (HAIR SKIN & NAILS ADVANCED PO) Take by mouth 1 day or 1 dose.    [provider]   Multiple Vitamins-Minerals (MULTIVITAMIN ADULT PO) Take 2 tablets by mouth every morning. Advanced multi-Bariatric vit    [provider]  natalizumab (TYSABRI) 300 MG/15ML injection Inject 15 mLs into the vein every 28 (twenty-eight) days.     [provider]  ondansetron (ZOFRAN-ODT) 4 MG disintegrating tablet Take 4 mg by mouth every 8 (eight) hours as needed for nausea or vomiting.    [provider]  pantoprazole (PROTONIX) 20 MG tablet Take 20 mg by mouth daily.    [provider]  potassium chloride (MICRO-K) 10 MEQ CR capsule Take 10 mEq by mouth 1 day or 1 dose.    [provider]  Sennosides-Docusate Sodium (STOOL SOFTENER/LAXATIVE PO) Take 1 tablet by mouth daily.    [provider]    Allergies Oxycodone and Darvon [propoxyphene]  Family History  Problem Relation Age of Onset  . Fibromyalgia Mother   . Stroke Father   . Diabetes Father   . Stroke Other     Social History Social History   Tobacco Use  . Smoking status: Never Smoker  . Smokeless tobacco: Never Used  Substance Use Topics  . Alcohol use: Yes    Comment: Occasional  . Drug use: No    Review of Systems  Constitutional: Negative for fever. Eyes: Negative for visual changes. ENT: Negative for sore throat. Cardiovascular: Negative for chest pain. Respiratory: Negative for shortness of breath. Gastrointestinal: Negative for abdominal pain, vomiting and diarrhea. Genitourinary: Negative for dysuria. Musculoskeletal: Negative for back pain. Skin: Negative for rash. Neurological: Negative for headaches, focal weakness or numbness. ____________________________________________  PHYSICAL EXAM:  VITAL SIGNS: ED Triage Vitals  Enc Vitals Group     BP 08/04/18 1531 (!) 189/87     Pulse Rate 08/04/18 1531 69     Resp 08/04/18 1531 16     Temp 08/04/18 1531 98.1 F (36.7 C)     Temp Source 08/04/18 1531 Oral     SpO2 08/04/18 1531 98 %     Weight  08/04/18 1526 240 lb 8.4 oz (109.1 kg)     Height --      Head Circumference --      Peak Flow --      Pain Score 08/04/18 1526 0     Pain Loc --      Pain Edu? --      Excl. in GC? --     Constitutional: Alert and oriented. Well appearing and in no distress. Head: Normocephalic and atraumatic. Eyes: Conjunctivae are normal. Normal extraocular movements Cardiovascular: Normal rate, regular rhythm. Normal distal pulses. Respiratory: Normal respiratory effort. No wheezes/rales/rhonchi. Musculoskeletal: Nontender with normal range of motion in all extremities.  Neurologic:  Normal gait without ataxia. Normal speech and language. No gross focal neurologic deficits are appreciated. Skin:  Skin is warm, dry and intact. No rash noted. Local area of ecchymosis, measuring approximately 3 x 2 cm to  the distal upper arm. No lymphangitis, streaking, or distal edema.  Psychiatric: Mood and affect are normal. Patient exhibits appropriate insight and judgment. ____________________________________________   RADIOLOGY  Venous US RUE  IMPRESSION: 1. No evidence of DVT within the right upper extremity. 2. No sonographic correlate for patient's area bruising involving anterior distal aspect of the right upper arm. ____________________________________________  PROCEDURES  Procedures ____________________________________________  INITIAL IMPRESSION / ASSESSMENT AND PLAN / ED COURSE  Macye Wagenblast was evaluated in Emergency Department on 08/04/2018 for the symptoms described in the history of present illness. She was evaluated in the context of the global COVID-19 pandemic, which necessitated consideration that the patient might be at risk for infection with the SARS-CoV-2 virus that causes COVID-19. Institutional protocols and algorithms that pertain to the evaluation of patients at risk for COVID-19 are in a state of rapid change based on information released by regulatory bodies including the CDC and  federal and state organizations. These policies and algorithms were followed during the patient's care in the ED.   Patient with ED evaluation of a local hematoma to the upper right arm. She is reassured by her negative [place] study. She will follow-up with her provider as scheduled.  ____________________________________________  FINAL CLINICAL IMPRESSION(S) / ED DIAGNOSES  Final diagnoses:  Swelling in right armpit  Superficial bruising of arm, right, initial encounter  Swelling of right upper extremity      Karmen Stabs, Charlesetta Ivory, PA-C 08/05/18 0045    Arnaldo Natal, MD 08/05/18 [phone]

## 2018-08-06 ENCOUNTER — Other Ambulatory Visit: Payer: Self-pay

## 2018-08-06 ENCOUNTER — Ambulatory Visit (HOSPITAL_COMMUNITY)
Admission: RE | Admit: 2018-08-06 | Discharge: 2018-08-06 | Disposition: A | Discharge status: Home / Self Care | Payer: Medicare HMO | Source: Ambulatory Visit | Attending: Neurology | Admitting: Neurology

## 2018-08-06 DIAGNOSIS — G35 Multiple sclerosis: Secondary | ICD-10-CM | POA: Insufficient documentation

## 2018-08-06 MED ORDER — SODIUM CHLORIDE 0.9 % IV SOLN
INTRAVENOUS | Status: DC | PRN
  Administered 2018-08-06: 250 mL via INTRAVENOUS

## 2018-08-06 MED ORDER — SODIUM CHLORIDE 0.9 % IV SOLN
300.0000 mg | INTRAVENOUS | Status: DC
  Administered 2018-08-06: 300 mg via INTRAVENOUS
  Filled 2018-08-06: qty 15

## 2018-08-06 NOTE — Progress Notes (Signed)
PATIENT CARE CENTER NOTE  Diagnosis: Multiple Sclerosis    Provider: Arlice Colt  MD   Procedure: Tysabri infusion    Note: Patient received Tysabri infusion. Tolerated well with no adverse reaction. Vital signs remained stable. Patient did not want  to remain for the 1 hour post-infusion. Alert, oriented and ambulatory at discharge.

## 2018-08-06 NOTE — Discharge Instructions (Signed)
Natalizumab injection What is this medicine? NATALIZUMAB (na ta LIZ you mab) is used to treat relapsing multiple sclerosis. This drug is not a cure. It is also used to treat Crohn's disease. This medicine may be used for other purposes; ask your health care provider or pharmacist if you have questions. COMMON BRAND NAME(S): Tysabri What should I tell my health care provider before I take this medicine? They need to know if you have any of these conditions:  immune system problems  progressive multifocal leukoencephalopathy (PML)  an unusual or allergic reaction to natalizumab, other medicines, foods, dyes, or preservatives  pregnant or trying to get pregnant  breast-feeding How should I use this medicine? This medicine is for infusion into a vein. It is given by a health care professional in a hospital or clinic setting. A special MedGuide will be given to you by the pharmacist with each prescription and refill. Be sure to read this information carefully each time. Talk to your pediatrician regarding the use of this medicine in children. This medicine is not approved for use in children. Overdosage: If you think you have taken too much of this medicine contact a poison control center or emergency room at once. NOTE: This medicine is only for you. Do not share this medicine with others. What if I miss a dose? It is important not to miss your dose. Call your doctor or health care professional if you are unable to keep an appointment. What may interact with this medicine?  azathioprine  cyclosporine  interferon  6-mercaptopurine  methotrexate  steroid medicines like prednisone or cortisone  TNF-alpha inhibitors like adalimumab, etanercept, and infliximab  vaccines This list may not describe all possible interactions. Give your health care provider a list of all the medicines, herbs, non-prescription drugs, or dietary supplements you use. Also tell them if you smoke, drink  alcohol, or use illegal drugs. Some items may interact with your medicine. What should I watch for while using this medicine? Your condition will be monitored carefully while you are receiving this medicine. Visit your doctor for regular check ups. Tell your doctor or healthcare professional if your symptoms do not start to get better or if they get worse. Stay away from people who are sick. Call your doctor or health care professional for advice if you get a fever, chills or sore throat, or other symptoms of a cold or flu. Do not treat yourself. In some patients, this medicine may cause a serious brain infection that may cause death. If you have any problems seeing, thinking, speaking, walking, or standing, tell your doctor right away. If you cannot reach your doctor, get urgent medical care. What side effects may I notice from receiving this medicine? Side effects that you should report to your doctor or health care professional as soon as possible:  allergic reactions like skin rash, itching or hives, swelling of the face, lips, or tongue  breathing problems  changes in vision  chest pain  dark urine  depression, feelings of sadness  dizziness  general ill feeling or flu-like symptoms  irregular, missed, or painful menstrual periods  light-colored stools  loss of appetite, nausea  muscle weakness  problems with balance, talking, or walking  right upper belly pain  unusually weak or tired  yellowing of the eyes or skin Side effects that usually do not require medical attention (report to your doctor or health care professional if they continue or are bothersome):  aches, pains  headache  stomach upset    tiredness This list may not describe all possible side effects. Call your doctor for medical advice about side effects. You may report side effects to FDA at 1-800-FDA-1088. Where should I keep my medicine? This drug is given in a hospital or clinic and will not be  stored at home. NOTE: This sheet is a summary. It may not cover all possible information. If you have questions about this medicine, talk to your doctor, pharmacist, or health care provider.  2020 Elsevier/Gold Standard (2008-03-04 13:33:21)  

## 2018-08-09 ENCOUNTER — Telehealth: Payer: Self-pay | Admitting: Neurology

## 2018-08-09 NOTE — Telephone Encounter (Signed)
Noted, will work on authorization. Checked chart and pt scheduled for next infusion 09/03/2018

## 2018-08-09 NOTE — Telephone Encounter (Signed)
Pt called and stated that her insurance needs to be called for an authorization for her natalizumab (TYSABRI) 300 MG/15ML injection Please advise.

## 2018-08-10 NOTE — Telephone Encounter (Signed)
PA Tysabri approved 08/13/18 to 02/13/2019. [employer]FI[employer]#[id number]. Member ID: [id number].   Faxed new orders with updated auth info to [facility] at [phone]. Received fax confirmation.

## 2018-08-10 NOTE — Telephone Encounter (Signed)
Submitted PA Tysabri on CMM to [employer] part B. [id number]. Waiting on determination.

## 2018-08-18 ENCOUNTER — Telehealth: Payer: Self-pay | Admitting: *Deleted

## 2018-08-18 NOTE — Telephone Encounter (Signed)
Received fax notification from touch prescribing program that pt authorized from06/27/20-12/26/20.  Enrollment number: [id number]. [place].  Site auth number: [id number].

## 2018-08-26 ENCOUNTER — Encounter: Payer: Self-pay | Admitting: Neurology

## 2018-08-26 ENCOUNTER — Telehealth: Payer: Self-pay | Admitting: Neurology

## 2018-08-26 ENCOUNTER — Other Ambulatory Visit: Payer: Self-pay

## 2018-08-26 ENCOUNTER — Ambulatory Visit: Payer: Medicare HMO | Admitting: Neurology

## 2018-08-26 ENCOUNTER — Telehealth: Payer: Self-pay | Admitting: *Deleted

## 2018-08-26 VITALS — BP 125/60 | HR 66 | Temp 98.4°F | Ht 68.0 in | Wt 216.5 lb

## 2018-08-26 DIAGNOSIS — G2581 Restless legs syndrome: Secondary | ICD-10-CM

## 2018-08-26 DIAGNOSIS — I6300 Cerebral infarction due to thrombosis of unspecified precerebral artery: Secondary | ICD-10-CM | POA: Diagnosis not present

## 2018-08-26 DIAGNOSIS — R350 Frequency of micturition: Secondary | ICD-10-CM

## 2018-08-26 DIAGNOSIS — G35 Multiple sclerosis: Secondary | ICD-10-CM | POA: Diagnosis not present

## 2018-08-26 DIAGNOSIS — E559 Vitamin D deficiency, unspecified: Secondary | ICD-10-CM

## 2018-08-26 DIAGNOSIS — G4733 Obstructive sleep apnea (adult) (pediatric): Secondary | ICD-10-CM

## 2018-08-26 DIAGNOSIS — F418 Other specified anxiety disorders: Secondary | ICD-10-CM

## 2018-08-26 DIAGNOSIS — Z9989 Dependence on other enabling machines and devices: Secondary | ICD-10-CM

## 2018-08-26 DIAGNOSIS — Z79899 Other long term (current) drug therapy: Secondary | ICD-10-CM

## 2018-08-26 DIAGNOSIS — R269 Unspecified abnormalities of gait and mobility: Secondary | ICD-10-CM

## 2018-08-26 DIAGNOSIS — G35D Multiple sclerosis, unspecified: Secondary | ICD-10-CM

## 2018-08-26 MED ORDER — SOLIFENACIN SUCCINATE 5 MG PO TABS: 5.0000 mg | ORAL_TABLET | Freq: Every day | ORAL | 11 refills | Status: DC

## 2018-08-26 MED ORDER — GABAPENTIN 300 MG PO CAPS: ORAL_CAPSULE | ORAL | 11 refills | Status: DC

## 2018-08-26 NOTE — Telephone Encounter (Signed)
Placed [facility] in [facility] lock box for routine lab pick up. Results pending. 

## 2018-08-26 NOTE — Telephone Encounter (Signed)
[employer] medicare order sent to [facility]. They will obtain the auth and reach out to the patient to schedule.  °

## 2018-08-26 NOTE — Progress Notes (Signed)
[facility]  PATIENT: Rachel Gilbert DOB: Jun 06, 1960  REFERRING DOCTOR OR PCP:  ER  No PCP SOURCE: ER notes, images on PACS, patient  _________________________________   HISTORICAL  CHIEF COMPLAINT:  Chief Complaint  Patient presents with  . Follow-up    RM 12, alone. Last seen 02/24/2018. Her legs have been bothering her more when she lays down at night. Describes as a throbbing.  Wanting to know if there is something OTC she can take. She is not interested in medication she can get addicted to.   . Multiple Sclerosis    On Tysabri. Last JCV negative, index: 0.23. NEEDS JCV LAB TODAY. Last infusion: 08/06/2018. Next infusion: 09/03/2018.  [name]. Hx CVA    Taking ASA, Plavix as preventative   . CPAP    Still using CPAP machine. She has lost a lot of weight and feels settings need adjusting. wakes up with her mouth very dry. Printed CPAP download from [employer]med. DME: [employer]    HISTORY OF PRESENT ILLNESS:  Rachel Gilbert is 58 y.o. woman with MS and OSA.    Update 08/26/2018: She is on Tysabri for her MS and feels her MS is mostly stable.      She is noting more pain in both legs with a throbbing characteristic.   Moving reduces the pain.  She eventually falls asleep (after an hour) and then stays asleep.    She has urinary urgency but no incontinence.     She has nocturia every 60-90 minutes at night but falls back asleep easily.     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   However, her mouth is dry when she wakes up which is annoying to her.    She has lost 56 pounds since surgery.    We discussed a repeat study in the future when weight is stable.    Update 02/24/2018: She is on Tysabri and she tolerates it well.   Her last infusion was 02/05/18.    She denies any exacerbation.   Her gait is baseline.  No falls.  No new numbness or  weakness.   She has urinary frequency and nocturia but no changes.   Vision is fine.   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    She is sleeping well.  She uses CPAP.  Mood is doing well.  She had a duodenal switch gastric bypass bariatric procedure and she is feeling well.   She has lost 21 pounds since the surgery.   She has had constipation since surgery and is on stool softeners.   She had a stroke March 2019.   She has some expressive aphasia that has improved.    This is often frustrating.  She is on Plavix plus aspirin.   No new TIA or stroke symptoms.    Update 08/06/2017: She feels her MS has been stable.  She is on Tysabri 300 mg every 4 weeks.  She tolerates it well.  Her JCV antibody is negative.  (0.22)  She has not had any more strokelike symptoms.  She was hospitalized at [facility] in late March and had aphasia due to a left MCA distribution stroke.  She still has mild aphasia.  Because she was on Tysabri, she also had a lumbar  puncture to rule out PML.  CT angiogram showed occlusion of 1 of the MCA branches.  TEE did not show a PFO or other source of emboli.  Carotid Dopplers did not show significant stenosis.  She was discharged on aspirin Plavix and continues on these.  She is doing about the same with her gait.   She stumbles but no recent falls.   Hr arm and leg strength are bout the same.     She is considering bariatric surgery.  She has OSA and uses CPAP nightly.    She still has some insomnia and rarely takes xanax if still awake after a while.    Update 05/07/2017: She was hospitalized at [facility] between 04/12/2017 and 04/21/2017 due to aphasia due to a left MCA distribution stroke. She also had some  There was a somewhat stuttering course and she first presented a few days earlier to an outside hospital and was  Treated with 3 days of IV Solu-Medrol with the thought that she might be having an  exacerbation.  When symptoms worsen on 04/12/2017 she presented to [facility].  I have reviewed imaging studies and laboratory results from that hospitalization.  Because she is on Tysabri, there was initially some concern that the stroke could represent PML.  However, she underwent a lumbar puncture in the CSF did not show any evidence of JCV by PCR.  Additionally, CT angiogram appears to show occlusion of 1 of the MCA branches.     She has been placed on aspirin and then Plavix was added.  She had a TEE but no evidence of a PFO or other source of emboli.  Doppler study showed less than 50% stenosis.  A loop recorder is being considered.    Risk factors:   HTN (on 3 med's), OSA, no DM, no smoking, lipids ok (though Lipitor was added).   .   For her MS, she continues on Tysabri.  Last infusion was 04/29/2017.    She tolerates it well and has not had any definite exacerbations since starting.    She has some gait issues but this is the same as before the stroke.    She has more anxiety, helped by escitalopram.  Update 11/06/2016:    She feels her MS is doing well. She is on Tysabri. She tolerates it well and has not had any definite exacerbations. Her last JCV antibody (05/07/2016) was negative at 0.15.  She denies any new symptoms.   Her gait is the same but she notes more left calf cramps.   Gait is wore on an uneven surface and climbing a few steps can be difficult.   She did have one fall when she bent down to pick up an ice cube that fell.   She did not hurt her head.     Her arms feels strong but the legs feels heavy (R=L) .  She denies numbness.    She notes frequency and urgency and rare urge incontinence.    Vision is doing well.   Fatigue and sleepiness persist to some extent, even with CPAP.  She feels a second wind in the late mornings .  She stopped phentermine because it did not help weight loss and did not help focus too much.  Many years ago she was o Ritalin with benefit for focus and attention,  fatigue and weight loss..    She has severe OSA and uses CPAP nightly (+8 cm)  _________________________________________ From 05/07/2016:  MS:   She has been on Tysabri since 05/2014.   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   We discussed the risks and benefits of Tysabri (about 1:2000 risk of PML assuming she fluctuates from low positive or negative)   OSA:   She has severe OSA. She is currently on CPAP +8 cm. She tolerates it well. I looked at the download from the last 53 days. It showed 100% compliance and a very good AHI equals 3.7. However, there was more than expected leak with 21.3 L/m.   The sleep study 2009 showed an AHI equals 38 with oxygen desaturations to 76%.   She notes that her mask sometimes leaks. She does not think that she snores through the mask.   She uses a FF mask and notes breathing through her mouth  Gait/strength/sensation/coordination:   Gait is slightly better.   Her new home does not have stairs and she does not tire out as much getting about.   Ampyra helped but the co-pay was very high at $400 and she cannot afford that. She does not think that she could afford the compounded drug (which is usually about $60 a month).    Balance is poor affecting gait.    She often uses a cane for balance.    Her legs are both mildly weak.   She has most trouble with changes in surface or with curbs.   . She denies weakness in the arms. She denies any numbness in the arms or legs. She has mild leg clumsiness. Ampyra has helped her gait and she has been on x 2 years.       Bladder/bowel: She has urinary urgency, frequency and nocturia (twice a night). Ditropan XL 10 mg has helped but she just takes now and then since starting the new CPAP machine.   At most  she has one x nocturia.     Vision: She feels her vision is fine.  Fatigue/sleep: She has physical and mental fatigue.  Fatigue is better on Tysabri than previous treatments.  She feels less fstigue and sleepiness with her CPAP machine.   Ampyra helps her gait endurance as she is able to walk a little further with it.  Phentermine helps as much as Adderall but makes her feel hot at times.    She has insomnia, both sleep onset and sleep maintenance.   Mood/Cognition:   She still has some crying spells and sometimes inappropriate crying.    She could not afford Nuedexta.    Her boyfriend died in a motorcycle accident last year.    She has done grief counseling.   She feels her cognitive fog is reduced since being on Tysabri and with stimulants.       MS history: She was diagnosed in September 2012 after presenting with diplopia. She was hospitalized and received 5 days of IV steroids and she had MRI and lumbar puncture. She recalls that the lumbar puncture was not consistent with MS but the MRI was.  She was placed on Gilenya in December 2012. She was able to return to work and the diplopia completely resolved. Then in July 2014 she had another exacerbation with visual disturbance bilaterally, gait changes and cognitive changes.  Reportedly, there was a new lesion in the brainstem and also in the spinal cord at that time. She received 5 more days of steroid.    In  early May 2016, she had the onset of several symptoms including worse cognitive problems, worse gait problems and weakness in the hands.   She went to the [place] regional emergency room on 06/03/2014 and she was evaluated. An admitted and received 3 days of IV Solu-Medrol.The MRI of the brain shows many white matter lesions. Some are periventricular with an appearance consistent with MS. She also has deep white matter subcortical and juxtacortical foci. Some of these look more consistent with small vessel ischemic changes and most more consistent  with multiple sclerosis. She does have new foci in the left vermis of the cerebellum and in the left frontal periventricular region.[name]    REVIEW OF SYSTEMS: Constitutional: No fevers, chills, sweats, or change in appetite.   She has fatigue Eyes: No visual changes, double vision, eye pain Ear, nose and throat: No hearing loss, ear pain, nasal congestion, sore throat Cardiovascular: No chest pain, palpitations Respiratory: No shortness of breath at rest or with exertion.   No wheezes GastrointestinaI: No nausea, vomiting, diarrhea, abdominal pain, fecal incontinence Genitourinary: No dysuria, urinary retention.   Frequency better on medications. Musculoskeletal: No neck pain, back pain Integumentary: No rash, pruritus, skin lesions Neurological: as above Psychiatric: No depression at this time.  No anxiety Endocrine: No palpitations, diaphoresis, change in appetite, change in weigh or increased thirst Hematologic/Lymphatic: No anemia, purpura, petechiae. Allergic/Immunologic: No itchy/runny eyes, nasal congestion, recent allergic reactions, rashes  ALLERGIES: Allergies  Allergen Reactions  . Oxycodone Anaphylaxis  . Darvon [Propoxyphene] Other (See Comments)    Throat swelling    HOME MEDICATIONS:  Current Outpatient Medications:  .  ALPRAZolam (XANAX) 0.5 MG tablet, Take 0.5 mg by mouth 2 (two) times daily as needed for anxiety., Disp: , Rfl:  .  aspirin EC 81 MG EC tablet, Take 1 tablet (81 mg total) by mouth daily., Disp: 30 tablet, Rfl: 0 .  atorvastatin (LIPITOR) 80 MG tablet, Take 1 tablet (80 mg total) by mouth daily at 6 PM., Disp: 30 tablet, Rfl: 0 .  Calcium-Vitamin D (CALTRATE 600 PLUS-VIT D PO), Take 4 tablets by mouth daily., Disp: , Rfl:  .  carvedilol (COREG) 25 MG tablet, Take 25 mg by mouth 2 (two) times daily with a meal., Disp: , Rfl:  .  clopidogrel (PLAVIX) 75 MG tablet, Take 1 tablet (75 mg total) by mouth daily., Disp: 90 tablet, Rfl: 1 .  escitalopram  (LEXAPRO) 20 MG tablet, Take 20 mg by mouth daily., Disp: , Rfl:  .  hydrALAZINE (APRESOLINE) 50 MG tablet, Take 50 mg by mouth 2 (two) times daily., Disp: , Rfl:  .  Multiple Vitamins-Minerals (HAIR SKIN & NAILS ADVANCED PO), Take by mouth 1 day or 1 dose., Disp: , Rfl:  .  natalizumab (TYSABRI) 300 MG/15ML injection, Inject 15 mLs into the vein every 28 (twenty-eight) days. , Disp: , Rfl:  .  ondansetron (ZOFRAN-ODT) 4 MG disintegrating tablet, Take 4 mg by mouth every 8 (eight) hours as needed for nausea or vomiting., Disp: , Rfl:  .  pantoprazole (PROTONIX) 20 MG tablet, Take 20 mg by mouth daily., Disp: , Rfl:  .  potassium chloride (MICRO-K) 10 MEQ CR capsule, Take 10 mEq by mouth 1 day or 1 dose., Disp: , Rfl:  .  UNABLE TO FIND, Take 2 tablets by mouth daily. Med Name: Bariatric chewable Advanced Multi EA, Disp: , Rfl:  .  gabapentin (NEURONTIN) 300 MG capsule, Take one or two at bedtime, Disp: 60 capsule, Rfl: 11 .  solifenacin (VESICARE) 5 MG tablet, Take 1 tablet (5 mg total) by mouth daily., Disp: 30 tablet, Rfl: 11  PAST MEDICAL HISTORY: Past Medical History:  Diagnosis Date  . Complication of anesthesia    hard time waking up   . Hearing loss   . Hypertension   . Kidney stones   . Multiple sclerosis (HCC)   . Sleep apnea    Uses CPAP  . Stroke (HCC) 03/2017  . Vision abnormalities     PAST SURGICAL HISTORY: Past Surgical History:  Procedure Laterality Date  . CESAREAN SECTION  1986  . CHOLECYSTECTOMY  2000   laparoscopic  . IR FLUORO GUIDED NEEDLE PLC ASPIRATION/INJECTION LOC  04/15/2017  . LAPAROSCOPIC GASTRIC RESTRICTIVE DUODENAL PROCEDURE (DUODENAL SWITCH)    . RADIOLOGY WITH ANESTHESIA N/A 04/15/2017   Procedure: LUMBER PUNCTURE MRI SCAN;  Surgeon: Radiologist, Medication, MD;  Location: [facility] OR;  Service: Radiology;  Laterality: N/A;  . TEE WITHOUT CARDIOVERSION N/A 04/16/2017   Procedure: TRANSESOPHAGEAL ECHOCARDIOGRAM (TEE) WITH LOOP;  Surgeon: Wendall StadeNishan, Peter C,  MD;  [facility];  Service: Cardiovascular;  Laterality: N/A;    FAMILY HISTORY: Family History  Problem Relation Age of Onset  . Fibromyalgia Mother   . Stroke Father   . Diabetes Father   . Stroke Other     SOCIAL HISTORY:  Social History   Socioeconomic History  . Marital status: Single    Spouse name: Not on file  . Number of children: Not on file  . Years of education: Not on file  . Highest education level: Not on file  Occupational History  . Occupation: disabled  Social Needs  . Financial resource strain: Not on file  . Food insecurity    Worry: Not on file    Inability: Not on file  . Transportation needs    Medical: Not on file    Non-medical: Not on file  Tobacco Use  . Smoking status: Never Smoker  . Smokeless tobacco: Never Used  Substance and Sexual Activity  . Alcohol use: Yes    Comment: Occasional  . Drug use: No  . Sexual activity: Not on file  Lifestyle  . Physical activity    Days per week: Not on file    Minutes per session: Not on file  . Stress: Not on file  Relationships  . Social Musicianconnections    Talks on phone: Not on file    Gets together: Not on file    Attends religious service: Not on file    Active member of club or organization: Not on file    Attends meetings of clubs or organizations: Not on file    Relationship status: Not on file  . Intimate partner violence    Fear of current or ex partner: Not on file    Emotionally abused: Not on file    Physically abused: Not on file    Forced sexual activity: Not on file  Other Topics Concern  . Not on file  Social History Narrative  . Not on file     PHYSICAL EXAM  Vitals:   08/26/18 0951  BP: 125/60  Pulse: 66  Temp: 98.4 F (36.9 C)  SpO2: 98%  Weight: 216 lb 8 oz (98.2 kg)  Height: 5\' 8"  (1.727 m)    Body mass index is 32.92 kg/m.   General: The patient is well-developed and well-nourished and in no acute distress  Neurologic Exam  Mental  status: The patient is alert and oriented  x 3 at the time of the examination.   Good affect today.  She is alert and oriented.     Focus and attention are good today.  No obvious aphasia today.  Cranial nerves: Extraocular movements are full.  PERRLA.  Color vision is symmetric.  Facial strength and sensation is normal.  Trapezius strength is normal.  Minimal dysarthria.  The tongue is midline, and the patient has  elevation of the soft palate.   Motor:  Muscle bulk is normal.  Muscle tone is normal in the arms are mildly increased in the left leg.  Strength is 5/5.except 4+/5 left toe extension.   Sensory: Sensory testing shows intact touch sensation in the arms and legs  Coordination: Cerebellar testing reveals good finger-nose-finger reduced left heel to shin n .  Gait and station: Station is normal.  She has a wide gait and poor tandem gait..    Romberg is negative.   Reflexes: Deep tendon reflexes are symmetric and slightly increased in legs.       DIAGNOSTIC DATA (LABS, IMAGING, TESTING) - I reviewed patient records, labs, notes, testing and imaging myself where available.  Lab Results  Component Value Date   WBC 9.9 02/24/2018   HGB 12.0 02/24/2018   HCT 36.1 02/24/2018   MCV 91 02/24/2018   PLT 221 02/24/2018      Component Value Date/Time   NA 143 02/24/2018 1332   K 3.8 02/24/2018 1332   CL 105 02/24/2018 1332   CO2 19 (L) 02/24/2018 1332   GLUCOSE 90 02/24/2018 1332   GLUCOSE 121 (H) 01/29/2018 1030   BUN 19 02/24/2018 1332   CREATININE 1.02 (H) 02/24/2018 1332   CALCIUM 9.6 02/24/2018 1332   PROT 5.9 (L) 02/24/2018 1332   ALBUMIN 4.3 02/24/2018 1332   AST 22 02/24/2018 1332   ALT 43 (H) 02/24/2018 1332   ALKPHOS 108 02/24/2018 1332   BILITOT 0.9 02/24/2018 1332   GFRNONAA 62 02/24/2018 1332   GFRAA 71 02/24/2018 1332      ASSESSMENT AND PLAN    1. Multiple sclerosis (HCC)   2. Cerebrovascular accident (CVA) due to thrombosis of precerebral artery  (HCC)   3. Obstructive sleep apnea on CPAP   4. Gait disturbance   5. Depression with anxiety   6. Vitamin D deficiency   7. Restless leg syndrome   8. High risk medication use   9. Urinary frequency      1.   She will continue Tysabri.   Check  JCV Ab and CBC.   We discussed switching to Ocrevus if she converts from JCV negative to positive.     2.   Continue plavix plus aspirin.    3.   Stay active and exercise as tolerated.     4.   Continue CPAP , she will ask DME about humidifier.   Consider a repeat study due to weight loss  5.   She will return in 6 months for f/u with me or call sooner if problems    40 minute face to face interaction with > 1/2 the time counseling and coordinating care about her MS and other symptoms  Ashawn Rinehart A. Epimenio Foot, MD, PhD, FAAN Certified in Neurology, Clinical Neurophysiology, Sleep Medicine, Pain Medicine and Neuroimaging Director, [facility] S[facility] at [facility]  [facility] [address], [address] [place], [place] [address] [phone]

## 2018-08-27 ENCOUNTER — Telehealth: Payer: Self-pay | Admitting: Neurology

## 2018-08-27 LAB — CBC WITH DIFFERENTIAL/PLATELET
Basophils Absolute: 0.1 10*3/uL (ref 0.0–0.2)
Basos: 1 %
EOS (ABSOLUTE): 0.3 10*3/uL (ref 0.0–0.4)
Eos: 3 %
Hematocrit: 36.9 % (ref 34.0–46.6)
Hemoglobin: 12.1 g/dL (ref 11.1–15.9)
Immature Grans (Abs): 0.1 10*3/uL (ref 0.0–0.1)
Immature Granulocytes: 1 %
Lymphocytes Absolute: 2.4 10*3/uL (ref 0.7–3.1)
Lymphs: 25 %
MCH: 29.4 pg (ref 26.6–33.0)
MCHC: 32.8 g/dL (ref 31.5–35.7)
MCV: 90 fL (ref 79–97)
Monocytes Absolute: 0.7 10*3/uL (ref 0.1–0.9)
Monocytes: 8 %
Neutrophils Absolute: 6.1 10*3/uL (ref 1.4–7.0)
Neutrophils: 62 %
Platelets: 248 10*3/uL (ref 150–450)
RBC: 4.11 x10E6/uL (ref 3.77–5.28)
RDW: 13.1 % (ref 11.7–15.4)
WBC: 9.6 10*3/uL (ref 3.4–10.8)

## 2018-08-27 LAB — VITAMIN D 25 HYDROXY (VIT D DEFICIENCY, FRACTURES): Vit D, 25-Hydroxy: 52.6 ng/mL (ref 30.0–100.0)

## 2018-08-27 NOTE — Telephone Encounter (Signed)
Pt called and states that she is needing financial assistance for her MRI and she was given a number in the past for this and would like RN to call her back with that number. Please advise.

## 2018-08-30 ENCOUNTER — Telehealth: Payer: Self-pay | Admitting: Neurology

## 2018-08-30 MED ORDER — OXYBUTYNIN CHLORIDE 5 MG PO TABS: 5.0000 mg | ORAL_TABLET | Freq: Two times a day (BID) | ORAL | 11 refills | Status: DC

## 2018-08-30 NOTE — Telephone Encounter (Signed)
Pt called in and stated she cant afford the solifenacin (VESICARE) 5 MG tablet, she wants to know if anything can be offered cheaper

## 2018-08-30 NOTE — Telephone Encounter (Signed)
Researched and pt can call  MSAA ([employer]) at [phone], ext. [phone].

## 2018-08-30 NOTE — Telephone Encounter (Signed)
I called pt. She is agreeable to try ditropan again and see if this helps urinary issues. I escribed to her pharmacy.

## 2018-08-30 NOTE — Telephone Encounter (Signed)
Spoke with Dr. Felecia Shelling. He recommends placing her back on ditroptan 5mg  po BID

## 2018-08-30 NOTE — Telephone Encounter (Signed)
Called pt. Relayed information below. She will call and fill out application for assistance. She is scheduled end of August for MRI. She will call back if anything further needed. She also asked about lab results. I reviewed in Epic. Advised labs WNL.

## 2018-09-03 ENCOUNTER — Other Ambulatory Visit: Payer: Self-pay

## 2018-09-03 ENCOUNTER — Ambulatory Visit (HOSPITAL_COMMUNITY)
Admission: RE | Admit: 2018-09-03 | Discharge: 2018-09-03 | Disposition: A | Discharge status: Home / Self Care | Payer: Medicare HMO | Source: Ambulatory Visit | Attending: Neurology | Admitting: Neurology

## 2018-09-03 DIAGNOSIS — G35 Multiple sclerosis: Secondary | ICD-10-CM | POA: Diagnosis not present

## 2018-09-03 MED ORDER — SODIUM CHLORIDE 0.9 % IV SOLN
INTRAVENOUS | Status: DC | PRN
  Administered 2018-09-03: 11:00:00 250 mL via INTRAVENOUS

## 2018-09-03 MED ORDER — SODIUM CHLORIDE 0.9 % IV SOLN
300.0000 mg | Freq: Once | INTRAVENOUS | Status: AC
  Administered 2018-09-03: 11:00:00 300 mg via INTRAVENOUS
  Filled 2018-09-03: qty 15

## 2018-09-03 NOTE — Discharge Instructions (Signed)
Natalizumab injection What is this medicine? NATALIZUMAB (na ta LIZ you mab) is used to treat relapsing multiple sclerosis. This drug is not a cure. It is also used to treat Crohn's disease. This medicine may be used for other purposes; ask your health care provider or pharmacist if you have questions. COMMON BRAND NAME(S): Tysabri What should I tell my health care provider before I take this medicine? They need to know if you have any of these conditions:  immune system problems  progressive multifocal leukoencephalopathy (PML)  an unusual or allergic reaction to natalizumab, other medicines, foods, dyes, or preservatives  pregnant or trying to get pregnant  breast-feeding How should I use this medicine? This medicine is for infusion into a vein. It is given by a health care professional in a hospital or clinic setting. A special MedGuide will be given to you by the pharmacist with each prescription and refill. Be sure to read this information carefully each time. Talk to your pediatrician regarding the use of this medicine in children. This medicine is not approved for use in children. Overdosage: If you think you have taken too much of this medicine contact a poison control center or emergency room at once. NOTE: This medicine is only for you. Do not share this medicine with others. What if I miss a dose? It is important not to miss your dose. Call your doctor or health care professional if you are unable to keep an appointment. What may interact with this medicine?  azathioprine  cyclosporine  interferon  6-mercaptopurine  methotrexate  steroid medicines like prednisone or cortisone  TNF-alpha inhibitors like adalimumab, etanercept, and infliximab  vaccines This list may not describe all possible interactions. Give your health care provider a list of all the medicines, herbs, non-prescription drugs, or dietary supplements you use. Also tell them if you smoke, drink  alcohol, or use illegal drugs. Some items may interact with your medicine. What should I watch for while using this medicine? Your condition will be monitored carefully while you are receiving this medicine. Visit your doctor for regular check ups. Tell your doctor or healthcare professional if your symptoms do not start to get better or if they get worse. Stay away from people who are sick. Call your doctor or health care professional for advice if you get a fever, chills or sore throat, or other symptoms of a cold or flu. Do not treat yourself. In some patients, this medicine may cause a serious brain infection that may cause death. If you have any problems seeing, thinking, speaking, walking, or standing, tell your doctor right away. If you cannot reach your doctor, get urgent medical care. What side effects may I notice from receiving this medicine? Side effects that you should report to your doctor or health care professional as soon as possible:  allergic reactions like skin rash, itching or hives, swelling of the face, lips, or tongue  breathing problems  changes in vision  chest pain  dark urine  depression, feelings of sadness  dizziness  general ill feeling or flu-like symptoms  irregular, missed, or painful menstrual periods  light-colored stools  loss of appetite, nausea  muscle weakness  problems with balance, talking, or walking  right upper belly pain  unusually weak or tired  yellowing of the eyes or skin Side effects that usually do not require medical attention (report to your doctor or health care professional if they continue or are bothersome):  aches, pains  headache  stomach upset    tiredness This list may not describe all possible side effects. Call your doctor for medical advice about side effects. You may report side effects to FDA at 1-800-FDA-1088. Where should I keep my medicine? This drug is given in a hospital or clinic and will not be  stored at home. NOTE: This sheet is a summary. It may not cover all possible information. If you have questions about this medicine, talk to your doctor, pharmacist, or health care provider.  2020 Elsevier/Gold Standard (2008-03-04 13:33:21)  

## 2018-09-03 NOTE — Progress Notes (Signed)
Patient received Tysabri via PIV. Patient declined 1 hour post infusion observation. Tolerated well, vitals stable, discharge instructions given, verbalized understanding. Patient alert, oriented and ambulatory at the time of discharge.  

## 2018-09-06 NOTE — Telephone Encounter (Signed)
JCV ab drawn on 08/26/18 negative, index: 0.17.

## 2018-09-08 ENCOUNTER — Telehealth: Payer: Self-pay | Admitting: *Deleted

## 2018-09-08 NOTE — Telephone Encounter (Signed)
Faxed completed/signed MSAA application for MRI brain to be completed at [phone]. Received fax confirmation.

## 2018-09-14 ENCOUNTER — Ambulatory Visit (LOCAL_COMMUNITY_HEALTH_CENTER): Payer: Self-pay

## 2018-09-14 ENCOUNTER — Other Ambulatory Visit: Payer: Self-pay

## 2018-09-14 ENCOUNTER — Telehealth: Payer: Self-pay | Admitting: Neurology

## 2018-09-14 DIAGNOSIS — Z111 Encounter for screening for respiratory tuberculosis: Secondary | ICD-10-CM

## 2018-09-14 NOTE — Telephone Encounter (Signed)
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

## 2018-09-14 NOTE — Telephone Encounter (Signed)
She is on Tysabri.  You can let her know that Tysabri is okay to be on with TB.    Some other medications used for MS but not safe unless the TB is completely eliminated.

## 2018-09-14 NOTE — Telephone Encounter (Signed)
Dr. Sater- please advise 

## 2018-09-14 NOTE — Progress Notes (Signed)
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 Reports having annual CXR for several years with no abnormal findings. TLTBI never initiated. Due to extensive medication list (including monthly Tysabri infusion),  hx of CVA and MS, client encouraged contact with medical providers regarding initiation of TLTBI should skin test be positive. Also encouraged client to notify providers of prior positive PPD hx. Shona Needles, RN

## 2018-09-15 NOTE — Telephone Encounter (Signed)
Called, LVM relaying Dr. Garth Bigness message below. Asked her to call back if she has further questions/concerns.

## 2018-09-17 ENCOUNTER — Ambulatory Visit (LOCAL_COMMUNITY_HEALTH_CENTER): Payer: Self-pay

## 2018-09-17 ENCOUNTER — Other Ambulatory Visit: Payer: Self-pay

## 2018-09-17 DIAGNOSIS — Z111 Encounter for screening for respiratory tuberculosis: Secondary | ICD-10-CM

## 2018-09-17 LAB — TB SKIN TEST
Induration: 0 mm
TB Skin Test: NEGATIVE

## 2018-09-17 NOTE — Progress Notes (Signed)
Pt states she has MS and is taking medication for MS. PPDR negative, 0 mm, no indurations and no redness at site.

## 2018-09-22 NOTE — Telephone Encounter (Signed)
[employer] medicare Josem Kaufmann: [id number] (exp. 09/21/18 to 03/20/19) patient is scheduled at GI for 09/24/18.

## 2018-09-24 ENCOUNTER — Other Ambulatory Visit: Payer: Self-pay

## 2018-09-24 ENCOUNTER — Ambulatory Visit
Admission: RE | Admit: 2018-09-24 | Discharge: 2018-09-24 | Disposition: A | Discharge status: Home / Self Care | Payer: Medicare HMO | Source: Ambulatory Visit | Attending: Neurology | Admitting: Neurology

## 2018-09-24 DIAGNOSIS — G35 Multiple sclerosis: Secondary | ICD-10-CM | POA: Diagnosis not present

## 2018-09-27 ENCOUNTER — Telehealth: Payer: Self-pay | Admitting: *Deleted

## 2018-09-27 NOTE — Telephone Encounter (Signed)
Called and spoke with pt about results per Dr. Sater note. She verbalized understanding.  

## 2018-09-27 NOTE — Telephone Encounter (Signed)
-----   Message from Britt Bottom, MD sent at 09/26/2018 10:11 PM EDT ----- Please let her know that the MRI of the brain did not show any new MS or stroke lesions compared to last year.

## 2018-10-01 ENCOUNTER — Encounter (HOSPITAL_COMMUNITY): Payer: Medicare HMO

## 2018-10-01 ENCOUNTER — Ambulatory Visit (HOSPITAL_COMMUNITY)
Admission: RE | Admit: 2018-10-01 | Discharge: 2018-10-01 | Disposition: A | Discharge status: Home / Self Care | Payer: Medicare HMO | Source: Ambulatory Visit | Attending: Neurology | Admitting: Neurology

## 2018-10-01 ENCOUNTER — Other Ambulatory Visit: Payer: Self-pay

## 2018-10-01 DIAGNOSIS — G35 Multiple sclerosis: Secondary | ICD-10-CM | POA: Diagnosis present

## 2018-10-01 MED ORDER — SODIUM CHLORIDE 0.9 % IV SOLN
300.0000 mg | Freq: Once | INTRAVENOUS | Status: AC
  Administered 2018-10-01: 14:00:00 300 mg via INTRAVENOUS
  Filled 2018-10-01: qty 15

## 2018-10-01 MED ORDER — SODIUM CHLORIDE 0.9 % IV SOLN
INTRAVENOUS | Status: DC | PRN
  Administered 2018-10-01: 250 mL via INTRAVENOUS

## 2018-10-01 NOTE — Progress Notes (Signed)
Patient received Tysabri via PIV. Patient declined 1 hour post infusion observation. Tolerated well, vitals stable, discharge instructions given, verbalized understanding. Patient alert, oriented and ambulatory at the time of discharge.  

## 2018-10-06 ENCOUNTER — Telehealth: Payer: Self-pay | Admitting: Neurology

## 2018-10-06 NOTE — Telephone Encounter (Signed)
[facility] on behalf of [facility] has called asking for RN Terrence Dupont to discuss infusions from 02-05-18 to 08-06-18. In addition to this message being left for RN Judge Stall was transferred to the infusion suite for any possible assistance they would be able to offer. Please call

## 2018-10-06 NOTE — Telephone Encounter (Signed)
Spoke with Liane (intrafusion). She already spoke with [employer] and informed them to call [facility] where pt receives Tysabri infusion. Discharged from intrafusion in 2018 per Liane.

## 2018-10-11 ENCOUNTER — Telehealth: Payer: Self-pay | Admitting: Neurology

## 2018-10-11 DIAGNOSIS — I878 Other specified disorders of veins: Secondary | ICD-10-CM

## 2018-10-11 DIAGNOSIS — G35 Multiple sclerosis: Secondary | ICD-10-CM

## 2018-10-11 DIAGNOSIS — Z79899 Other long term (current) drug therapy: Secondary | ICD-10-CM

## 2018-10-11 DIAGNOSIS — G35D Multiple sclerosis, unspecified: Secondary | ICD-10-CM

## 2018-10-11 NOTE — Telephone Encounter (Signed)
Pt called stating that they are having problems at [facility] finding her veins and she "is tired of being a pin cushion" and she would like to know if she can have a Port put in or be given another medication as good as the Tysabri given to her. Please advise.

## 2018-10-11 NOTE — Telephone Encounter (Signed)
Called pt. Advised we placed order for port-a-cath placement. This will be done at [facility]. She will be called to get scheduled. She should speak with infusion site about making sure this is maintained. Should be flushed at least monthly. She gets Tysabri monthly so this should keep it patent and flushed regularly. She verbalized understanding.

## 2018-10-11 NOTE — Telephone Encounter (Signed)
Dr. Felecia Shelling- ok to place order for port-a-cath?

## 2018-10-11 NOTE — Telephone Encounter (Signed)
Ok for port-a-cath referral

## 2018-10-12 ENCOUNTER — Other Ambulatory Visit (HOSPITAL_COMMUNITY): Payer: Self-pay | Admitting: Neurology

## 2018-10-12 ENCOUNTER — Telehealth: Payer: Self-pay | Admitting: Neurology

## 2018-10-12 DIAGNOSIS — I878 Other specified disorders of veins: Secondary | ICD-10-CM

## 2018-10-12 NOTE — Telephone Encounter (Signed)
Tessa from [facility] denial department called. Wanting letter of how Fritzi Mandes is being paid for. I called her supervisor, Larrie Kass at [phone]. Advised pt has current [place] on file for Tysabri. She gets infusion via [facility] [place]. She is touched authorized for this. She is also authorized via [employer] to receive Tysabri. I faxed Johann Capers all this information at [phone] per Anne's request. Received fax confirmation.

## 2018-10-12 NOTE — Telephone Encounter (Signed)
Rachel Gilbert has called stating it is important that she speaks with RN Terrence Dupont re:the Tysabri for pt.  Rachel Gilbert states

## 2018-10-19 ENCOUNTER — Other Ambulatory Visit: Payer: Self-pay | Admitting: Radiology

## 2018-10-20 ENCOUNTER — Telehealth: Payer: Self-pay | Admitting: Neurology

## 2018-10-20 ENCOUNTER — Ambulatory Visit (HOSPITAL_COMMUNITY)
Admission: RE | Admit: 2018-10-20 | Discharge: 2018-10-20 | Disposition: A | Discharge status: Home / Self Care | Payer: Medicare HMO | Source: Ambulatory Visit | Attending: Neurology | Admitting: Neurology

## 2018-10-20 ENCOUNTER — Other Ambulatory Visit: Payer: Self-pay

## 2018-10-20 ENCOUNTER — Encounter (HOSPITAL_COMMUNITY): Payer: Self-pay

## 2018-10-20 DIAGNOSIS — Z8673 Personal history of transient ischemic attack (TIA), and cerebral infarction without residual deficits: Secondary | ICD-10-CM | POA: Diagnosis not present

## 2018-10-20 DIAGNOSIS — Z7902 Long term (current) use of antithrombotics/antiplatelets: Secondary | ICD-10-CM | POA: Diagnosis not present

## 2018-10-20 DIAGNOSIS — Z79899 Other long term (current) drug therapy: Secondary | ICD-10-CM | POA: Insufficient documentation

## 2018-10-20 DIAGNOSIS — G35 Multiple sclerosis: Secondary | ICD-10-CM | POA: Insufficient documentation

## 2018-10-20 DIAGNOSIS — H9190 Unspecified hearing loss, unspecified ear: Secondary | ICD-10-CM | POA: Diagnosis not present

## 2018-10-20 DIAGNOSIS — G4730 Sleep apnea, unspecified: Secondary | ICD-10-CM | POA: Diagnosis not present

## 2018-10-20 DIAGNOSIS — I878 Other specified disorders of veins: Secondary | ICD-10-CM | POA: Insufficient documentation

## 2018-10-20 DIAGNOSIS — Z7982 Long term (current) use of aspirin: Secondary | ICD-10-CM | POA: Insufficient documentation

## 2018-10-20 DIAGNOSIS — I10 Essential (primary) hypertension: Secondary | ICD-10-CM | POA: Insufficient documentation

## 2018-10-20 DIAGNOSIS — Z95828 Presence of other vascular implants and grafts: Secondary | ICD-10-CM

## 2018-10-20 HISTORY — PX: IR IMAGING GUIDED PORT INSERTION: IMG5740

## 2018-10-20 LAB — CBC
HCT: 40.6 % (ref 36.0–46.0)
Hemoglobin: 12.9 g/dL (ref 12.0–15.0)
MCH: 29.7 pg (ref 26.0–34.0)
MCHC: 31.8 g/dL (ref 30.0–36.0)
MCV: 93.5 fL (ref 80.0–100.0)
Platelets: 238 10*3/uL (ref 150–400)
RBC: 4.34 MIL/uL (ref 3.87–5.11)
RDW: 14.7 % (ref 11.5–15.5)
WBC: 9.4 10*3/uL (ref 4.0–10.5)
nRBC: 0 % (ref 0.0–0.2)

## 2018-10-20 LAB — BASIC METABOLIC PANEL
Anion gap: 7 (ref 5–15)
BUN: 11 mg/dL (ref 6–20)
CO2: 28 mmol/L (ref 22–32)
Calcium: 9.1 mg/dL (ref 8.9–10.3)
Chloride: 109 mmol/L (ref 98–111)
Creatinine, Ser: 1.14 mg/dL — ABNORMAL HIGH (ref 0.44–1.00)
GFR calc Af Amer: >60 mL/min (ref >60)
GFR calc non Af Amer: 53 mL/min — ABNORMAL LOW (ref >60)
Glucose, Bld: 91 mg/dL (ref 70–99)
Potassium: 3 mmol/L — ABNORMAL LOW (ref 3.5–5.1)
Sodium: 144 mmol/L (ref 135–145)

## 2018-10-20 LAB — PROTIME-INR
INR: 1.1 (ref 0.8–1.2)
Prothrombin Time: 13.6 seconds (ref 11.4–15.2)

## 2018-10-20 MED ORDER — FENTANYL CITRATE (PF) 100 MCG/2ML IJ SOLN
INTRAMUSCULAR | Status: AC | PRN
  Administered 2018-10-20 (×2): 50 ug via INTRAVENOUS

## 2018-10-20 MED ORDER — LIDOCAINE-EPINEPHRINE (PF) 1 %-1:200000 IJ SOLN
INTRAMUSCULAR | Status: AC
  Filled 2018-10-20: qty 30

## 2018-10-20 MED ORDER — LIDOCAINE-EPINEPHRINE (PF) 1 %-1:200000 IJ SOLN
INTRAMUSCULAR | Status: AC | PRN
  Administered 2018-10-20: 10 mL

## 2018-10-20 MED ORDER — LIDOCAINE-PRILOCAINE 2.5-2.5 % EX CREA: 1.0000 "application " | TOPICAL_CREAM | CUTANEOUS | 1 refills | Status: DC | PRN

## 2018-10-20 MED ORDER — HYDRALAZINE HCL 20 MG/ML IJ SOLN: 10.0000 mg | INTRAMUSCULAR | Status: DC | PRN

## 2018-10-20 MED ORDER — CEFAZOLIN SODIUM-DEXTROSE 2-4 GM/100ML-% IV SOLN: 2.0000 g | INTRAVENOUS | Status: DC

## 2018-10-20 MED ORDER — MIDAZOLAM HCL 2 MG/2ML IJ SOLN
INTRAMUSCULAR | Status: AC | PRN
  Administered 2018-10-20 (×2): 1 mg via INTRAVENOUS

## 2018-10-20 MED ORDER — MIDAZOLAM HCL 2 MG/2ML IJ SOLN
INTRAMUSCULAR | Status: AC
  Filled 2018-10-20: qty 2

## 2018-10-20 MED ORDER — SODIUM CHLORIDE 0.9 % IV SOLN
INTRAVENOUS | Status: AC | PRN
  Administered 2018-10-20: 10 mL/h via INTRAVENOUS

## 2018-10-20 MED ORDER — CEFAZOLIN SODIUM-DEXTROSE 2-4 GM/100ML-% IV SOLN
INTRAVENOUS | Status: AC
  Filled 2018-10-20: qty 100

## 2018-10-20 MED ORDER — HYDRALAZINE HCL 20 MG/ML IJ SOLN
INTRAMUSCULAR | Status: AC
  Filled 2018-10-20: qty 1

## 2018-10-20 MED ORDER — HEPARIN SOD (PORK) LOCK FLUSH 100 UNIT/ML IV SOLN
INTRAVENOUS | Status: AC
  Filled 2018-10-20: qty 5

## 2018-10-20 MED ORDER — SODIUM CHLORIDE 0.9 % IV SOLN: INTRAVENOUS | Status: DC

## 2018-10-20 MED ORDER — HEPARIN SOD (PORK) LOCK FLUSH 100 UNIT/ML IV SOLN
INTRAVENOUS | Status: AC | PRN
  Administered 2018-10-20: 500 [IU] via INTRAVENOUS

## 2018-10-20 MED ORDER — ONDANSETRON HCL 4 MG/2ML IJ SOLN: 4.0000 mg | Freq: Once | INTRAMUSCULAR | Status: DC

## 2018-10-20 MED ORDER — FENTANYL CITRATE (PF) 100 MCG/2ML IJ SOLN
INTRAMUSCULAR | Status: AC
  Filled 2018-10-20: qty 2

## 2018-10-20 NOTE — Addendum Note (Signed)
Addended by: Hope Pigeon on: 10/20/2018 12:24 PM   Modules accepted: Orders

## 2018-10-20 NOTE — Progress Notes (Signed)
Discharge instructions reviewed with patient and friend Dayton Bailiff. Verbalized understanding.

## 2018-10-20 NOTE — Discharge Instructions (Signed)
Implanted Port Insertion, Care After °This sheet gives you information about how to care for yourself after your procedure. Your health care provider may also give you more specific instructions. If you have problems or questions, contact your health care provider. °What can I expect after the procedure? °After the procedure, it is common to have: °· Discomfort at the port insertion site. °· Bruising on the skin over the port. This should improve over 3-4 days. °Follow these instructions at home: °Port care °· After your port is placed, you will get a manufacturer's information card. The card has information about your port. Keep this card with you at all times. °· Take care of the port as told by your health care provider. Ask your health care provider if you or a family member can get training for taking care of the port at home. A home health care nurse may also take care of the port. °· Make sure to remember what type of port you have. °Incision care ° °  ° °· Follow instructions from your health care provider about how to take care of your port insertion site. Make sure you: °? Wash your hands with soap and water before and after you change your bandage (dressing). If soap and water are not available, use hand sanitizer. °? Change your dressing as told by your health care provider. °? Leave stitches (sutures), skin glue, or adhesive strips in place. These skin closures may need to stay in place for 2 weeks or longer. If adhesive strip edges start to loosen and curl up, you may trim the loose edges. Do not remove adhesive strips completely unless your health care provider tells you to do that. °· Check your port insertion site every day for signs of infection. Check for: °? Redness, swelling, or pain. °? Fluid or blood. °? Warmth. °? Pus or a bad smell. °Activity °· Return to your normal activities as told by your health care provider. Ask your health care provider what activities are safe for you. °· Do not  lift anything that is heavier than 10 lb (4.5 kg), or the limit that you are told, until your health care provider says that it is safe. °General instructions °· Take over-the-counter and prescription medicines only as told by your health care provider. °· Do not take baths, swim, or use a hot tub until your health care provider approves. Ask your health care provider if you may take showers. You may only be allowed to take sponge baths. °· Do not drive for 24 hours if you were given a sedative during your procedure. °· Wear a medical alert bracelet in case of an emergency. This will tell any health care providers that you have a port. °· Keep all follow-up visits as told by your health care provider. This is important. °Contact a health care provider if: °· You cannot flush your port with saline as directed, or you cannot draw blood from the port. °· You have a fever or chills. °· You have redness, swelling, or pain around your port insertion site. °· You have fluid or blood coming from your port insertion site. °· Your port insertion site feels warm to the touch. °· You have pus or a bad smell coming from the port insertion site. °Get help right away if: °· You have chest pain or shortness of breath. °· You have bleeding from your port that you cannot control. °Summary °· Take care of the port as told by your health   care provider. Keep the manufacturer's information card with you at all times. °· Change your dressing as told by your health care provider. °· Contact a health care provider if you have a fever or chills or if you have redness, swelling, or pain around your port insertion site. °· Keep all follow-up visits as told by your health care provider. °This information is not intended to replace advice given to you by your health care provider. Make sure you discuss any questions you have with your health care provider. °Document Released: 11/03/2012 Document Revised: 08/11/2017 Document Reviewed:  08/11/2017 °Elsevier Patient Education © 2020 Elsevier Inc. ° °Moderate Conscious Sedation, Adult, Care After °These instructions provide you with information about caring for yourself after your procedure. Your health care provider may also give you more specific instructions. Your treatment has been planned according to current medical practices, but problems sometimes occur. Call your health care provider if you have any problems or questions after your procedure. °What can I expect after the procedure? °After your procedure, it is common: °· To feel sleepy for several hours. °· To feel clumsy and have poor balance for several hours. °· To have poor judgment for several hours. °· To vomit if you eat too soon. °Follow these instructions at home: °For at least 24 hours after the procedure: ° °· Do not: °? Participate in activities where you could fall or become injured. °? Drive. °? Use heavy machinery. °? Drink alcohol. °? Take sleeping pills or medicines that cause drowsiness. °? Make important decisions or sign legal documents. °? Take care of children on your own. °· Rest. °Eating and drinking °· Follow the diet recommended by your health care provider. °· If you vomit: °? Drink water, juice, or soup when you can drink without vomiting. °? Make sure you have little or no nausea before eating solid foods. °General instructions °· Have a responsible adult stay with you until you are awake and alert. °· Take over-the-counter and prescription medicines only as told by your health care provider. °· If you smoke, do not smoke without supervision. °· Keep all follow-up visits as told by your health care provider. This is important. °Contact a health care provider if: °· You keep feeling nauseous or you keep vomiting. °· You feel light-headed. °· You develop a rash. °· You have a fever. °Get help right away if: °· You have trouble breathing. °This information is not intended to replace advice given to you by your  health care provider. Make sure you discuss any questions you have with your health care provider. °Document Released: 11/03/2012 Document Revised: 12/26/2016 Document Reviewed: 05/05/2015 °Elsevier Patient Education © 2020 Elsevier Inc. ° °

## 2018-10-20 NOTE — Telephone Encounter (Addendum)
Spoke with Dr. Felecia Shelling, he is okay with sending in rx. I called pt and relayed we sent in prescription for her. She verbalized understanding and appreciation. She will call back if anything further needed.

## 2018-10-20 NOTE — Procedures (Signed)
Interventional Radiology Procedure Note  Procedure: Placement of a right IJ approach single lumen PowerPort.  Tip is positioned at the superior cavoatrial junction and catheter is ready for immediate use.  Complications: No immediate Recommendations:  - Ok to shower tomorrow - Do not submerge for 7 days - Routine line care   Signed,  Jini Horiuchi K. Anvika Gashi, MD   

## 2018-10-20 NOTE — H&P (Signed)
Chief Complaint: Patient was seen in consultation today for port placement.  Referring Physician(s): Belle Chasse A  Supervising Physician: Jacqulynn Cadet  Patient Status: [facility] - Out-pt  History of Present Illness: Rachel Gilbert is a 58 y.o. female with a past medical history significant for sleep apnea, stroke, hearing loss, HTN and multiple sclerosis currently on Tysabri monthly who presents today for port placement. Patient has been receiving monthly infusions of Tysabri for several years due to her multiple sclerosis, however recently she has noticed that the last several times she received infusions it was very difficult for the staff to find an appropriate vein resulting in her being stuck multiple times. She has requested to have a port placed so that she has reliable IV access for her infusions.  Past Medical History:  Diagnosis Date  . Complication of anesthesia    hard time waking up   . Hearing loss   . Hypertension   . Kidney stones   . Multiple sclerosis ([place])   . Sleep apnea    Uses CPAP  . Stroke ([place]) 03/2017  . Vision abnormalities     Past Surgical History:  Procedure Laterality Date  . CESAREAN SECTION  1986  . CHOLECYSTECTOMY  2000   laparoscopic  . IR FLUORO GUIDED NEEDLE PLC ASPIRATION/INJECTION LOC  04/15/2017  . LAPAROSCOPIC GASTRIC RESTRICTIVE DUODENAL PROCEDURE (DUODENAL SWITCH)    . RADIOLOGY WITH ANESTHESIA N/A 04/15/2017   Procedure: LUMBER PUNCTURE MRI SCAN;  Surgeon: Radiologist, Medication, MD;  Location: [facility];  Service: Radiology;  Laterality: N/A;  . TEE WITHOUT CARDIOVERSION N/A 04/16/2017   Procedure: TRANSESOPHAGEAL ECHOCARDIOGRAM (TEE) WITH LOOP;  Surgeon: Josue Hector, MD;  Location: [facility];  Service: Cardiovascular;  Laterality: N/A;    Allergies: Darvon [propoxyphene], Oxycodone, and Percocet [oxycodone-acetaminophen]  Medications: Prior to Admission medications   Medication Sig Start Date End Date Taking? Authorizing  Provider  ALPRAZolam Duanne Moron) 0.5 MG tablet Take 0.5 mg by mouth 2 (two) times daily as needed for anxiety.   Yes [provider]  aspirin EC 81 MG EC tablet Take 1 tablet (81 mg total) by mouth daily. 04/12/17  Yes Hillary Bow, MD  atorvastatin (LIPITOR) 80 MG tablet Take 1 tablet (80 mg total) by mouth daily at 6 PM. 04/21/17  Yes Ogbata, Babs Bertin, MD  Calcium-Vitamin D (CALTRATE 600 PLUS-VIT D PO) Take 4 tablets by mouth daily.   Yes [provider]  carvedilol (COREG) 25 MG tablet Take 25 mg by mouth 2 (two) times daily with a meal.   Yes [provider]  escitalopram (LEXAPRO) 20 MG tablet Take 20 mg by mouth daily.   Yes [provider]  gabapentin (NEURONTIN) 300 MG capsule Take one or two at bedtime Patient taking differently: Take 600 mg by mouth at bedtime.  08/26/18  Yes Sater, Nanine Means, MD  hydrALAZINE (APRESOLINE) 50 MG tablet Take 50 mg by mouth 2 (two) times daily.   Yes [provider]  Multiple Vitamins-Minerals (HAIR SKIN & NAILS ADVANCED PO) Take 1 tablet by mouth daily.    Yes [provider]  natalizumab (TYSABRI) 300 MG/15ML injection Inject 15 mLs into the vein every 28 (twenty-eight) days.    Yes [provider]  ondansetron (ZOFRAN-ODT) 4 MG disintegrating tablet Take 4 mg by mouth every 8 (eight) hours as needed for nausea or vomiting.   Yes [provider]  oxybutynin (DITROPAN) 5 MG tablet Take 1 tablet (5 mg total) by mouth 2 (two) times  daily. Patient taking differently: Take 10 mg by mouth at bedtime.  08/30/18  Yes Sater, Pearletha Furlichard A, MD  pantoprazole (PROTONIX) 20 MG tablet Take 20 mg by mouth daily.   Yes [provider]  potassium chloride (MICRO-K) 10 MEQ CR capsule Take 10 mEq by mouth daily.    Yes [provider]  UNABLE TO FIND Take 2 tablets by mouth daily. Med Name: Bariatric chewable Advanced Multi EA   Yes [provider]  clopidogrel (PLAVIX) 75 MG tablet  Take 1 tablet (75 mg total) by mouth daily. 05/26/18   Sater, Pearletha Furlichard A, MD     Family History  Problem Relation Age of Onset  . Fibromyalgia Mother   . Stroke Father   . Diabetes Father   . Stroke Other     Social History   Socioeconomic History  . Marital status: Single    Spouse name: Not on file  . Number of children: Not on file  . Years of education: Not on file  . Highest education level: Not on file  Occupational History  . Occupation: disabled  Social Needs  . Financial resource strain: Not on file  . Food insecurity    Worry: Not on file    Inability: Not on file  . Transportation needs    Medical: Not on file    Non-medical: Not on file  Tobacco Use  . Smoking status: Never Smoker  . Smokeless tobacco: Never Used  Substance and Sexual Activity  . Alcohol use: Yes    Comment: Occasional  . Drug use: No  . Sexual activity: Not on file  Lifestyle  . Physical activity    Days per week: Not on file    Minutes per session: Not on file  . Stress: Not on file  Relationships  . Social Musicianconnections    Talks on phone: Not on file    Gets together: Not on file    Attends religious service: Not on file    Active member of club or organization: Not on file    Attends meetings of clubs or organizations: Not on file    Relationship status: Not on file  Other Topics Concern  . Not on file  Social History Narrative  . Not on file     Review of Systems: A 12 point ROS discussed and pertinent positives are indicated in the HPI above.  All other systems are negative.  Review of Systems  Constitutional: Negative for chills and fever.  HENT: Negative for nosebleeds.   Respiratory: Negative for cough and shortness of breath.   Cardiovascular: Negative for chest pain.  Gastrointestinal: Negative for abdominal pain, blood in stool, diarrhea, nausea and vomiting.  Genitourinary: Negative for hematuria.  Musculoskeletal: Negative for back pain.  Skin: Negative for rash  and wound.  Neurological: Negative for dizziness, syncope and headaches.    Vital Signs: BP (!) 169/84   Pulse (!) 57   Temp 98 F (36.7 C) (Oral)   Resp 16   Ht 5' 8.5" (1.74 m)   Wt 225 lb (102.1 kg)   LMP 01/15/2015   SpO2 99%   BMI 33.71 kg/m   Physical Exam Vitals signs reviewed.  Constitutional:      General: She is not in acute distress. HENT:     Head: Normocephalic.     Mouth/Throat:     Mouth: Mucous membranes are moist.     Pharynx: Oropharynx is clear. No oropharyngeal exudate or posterior oropharyngeal erythema.  Cardiovascular:     Rate and Rhythm: Normal rate and regular rhythm.  Pulmonary:     Effort: Pulmonary effort is normal.     Breath sounds: Normal breath sounds.  Abdominal:     General: There is no distension.     Palpations: Abdomen is soft.     Tenderness: There is no abdominal tenderness.  Skin:    General: Skin is warm and dry.  Neurological:     Mental Status: She is alert and oriented to person, place, and time.  Psychiatric:        Mood and Affect: Mood normal.        Behavior: Behavior normal.        Thought Content: Thought content normal.        Judgment: Judgment normal.      MD Evaluation Airway: WNL Heart: WNL Abdomen: WNL Chest/ Lungs: WNL ASA  Classification: 3 Mallampati/Airway Score: One   Imaging: Mr Brain Wo Contrast  Result Date: 09/26/2018  [facility] [address], [address] [place], [place] [address] [phone] NEUROIMAGING REPORT STUDY DATE: 09/24/2018 PATIENT NAME: Nakaia Tessmann DOB: 03-19-1960 MRN: [id number] EXAM: MRI Brain without contrast ORDERING CLINICIAN: Richard A. Sater, MD. PhD CLINICAL HISTORY: 58 year old woman with multiple sclerosis COMPARISON FILMS: MRI 04/15/2017 TECHNIQUE: MRI of the brain without contrast was obtained utilizing 5 mm axial slices with T1, T2, T2 flair, SWI and diffusion weighted views.  T1 sagittal and T2 coronal views were obtained. CONTRAST: none IMAGING SITE:  [facility], [address] [place], [place] FINDINGS: On sagittal images, the spinal cord is imaged caudally to C3 and is normal in caliber.   The contents of the posterior fossa are of normal size and position.   The pituitary gland and optic chiasm appear normal.    Brain volume appears normal.   The ventricles are normal in size and without distortion.  There are no abnormal extra-axial collections of fluid.  The cerebellum appears normal.  There is one focus in the left pons.  There are 2 foci within the left thalamus more consistent with chronic lacunar infarction than to demyelination.  There are multiple T2/flair hyperintense foci in the periventricular, juxtacortical and deep white matter.  Some of the deep white matter foci, especially those in the left frontal hemisphere likely represent the sequela of the acute stroke noted on the 04/15/2017 MRI.  Other foci are more consistent with demyelination.  None of the foci appear to be acute.  Compared to the MRI from 04/15/2017, there are no new lesions.  Diffusion weighted images are normal.  Susceptibility weighted images are normal.  The orbits appear normal.   The VIIth/VIIIth nerve complex appears normal.  The mastoid air cells appear normal.  There is mucoperiosteal thickening of the right hemi-sphenoid.  The other paranasal sinuses appear normal.  Flow voids are identified within the major intracerebral arteries.     This MRI of the brain without contrast shows the following: 1.    Multiple T2/flair hyperintense foci in the periventricular, juxtacortical, subcortical and deep white matter.  There are also foci in the pons and left thalamus.  Some of the foci in the left frontal lobe likely represent the sequela of the acute stroke noted on the 04/15/2017 MRI.  Other foci are more likely to represent demyelination from MS.  None of the foci appear to be acute and there are no new lesions compared to the 2019 study. 2.    Mild chronic sphenoid  sinusitis.  3.    There are no acute findings. INTERPRETING PHYSICIAN: Richard A. Epimenio Foot, MD, PhD, FAAN Certified in  Neuroimaging by American Society of Neuroimaging    Labs:  CBC: Recent Labs    01/29/18 1030 02/24/18 1332 08/26/18 1240  WBC 13.3* 9.9 9.6  HGB 11.8* 12.0 12.1  HCT 35.7* 36.1 36.9  PLT 286 221 248    COAGS: No results for input(s): INR, APTT in the last 8760 hours.  BMP: Recent Labs    01/29/18 1030 02/24/18 1332  NA 139 143  K 3.4* 3.8  CL 106 105  CO2 23 19*  GLUCOSE 121* 90  BUN 24* 19  CALCIUM 8.8* 9.6  CREATININE 1.02* 1.02*  GFRNONAA >60 62  GFRAA >60 71    LIVER FUNCTION TESTS: Recent Labs    01/29/18 1030 02/24/18 1332  BILITOT 1.6* 0.9  AST 23 22  ALT 30 43*  ALKPHOS 76 108  PROT 6.2* 5.9*  ALBUMIN 4.0 4.3    TUMOR MARKERS: No results for input(s): AFPTM, CEA, CA199, CHROMGRNA in the last 8760 hours.  Assessment and Plan:  58 y/o F with multiple sclerosis currently receiving monthly Tysarbi infusions who presents today for port placement for durable IV access.   Patient has been NPO since 11:55 pm last night, last dose of Plavix 10/13/18. Afebrile, pre-procedure lab work pending at the time of this note writing and will be reviewed prior to procedure.  Risks and benefits of image guided port-a-catheter placement were discussed with the patient including, but not limited to bleeding, infection, pneumothorax, or fibrin sheath development and need for additional procedures.  All of the patient's questions were answered, patient is agreeable to proceed.  Consent signed and in chart.  Thank you for this interesting consult.  I greatly enjoyed meeting Rachel Gilbert and look forward to participating in their care.  A copy of this report was sent to the requesting provider on this date.  Electronically Signed: Villa Herb, PA-C 10/20/2018, 7:53 AM   I spent a total of 15 Minutes in face to face in clinical consultation,  greater than 50% of which was counseling/coordinating care for port placement.

## 2018-10-20 NOTE — Telephone Encounter (Signed)
Patient calling because she had her port put in this morning & was told to get EMLA cream to use on the port before her next infusion. She is scheduled for 10/29/18. Please send to [facility] on [address] in [place]. Please call patient also at [phone]

## 2018-10-25 ENCOUNTER — Encounter (HOSPITAL_COMMUNITY): Payer: Self-pay | Admitting: Interventional Radiology

## 2018-10-29 ENCOUNTER — Other Ambulatory Visit: Payer: Self-pay

## 2018-10-29 ENCOUNTER — Ambulatory Visit (HOSPITAL_COMMUNITY)
Admission: RE | Admit: 2018-10-29 | Discharge: 2018-10-29 | Disposition: A | Discharge status: Home / Self Care | Payer: Medicare HMO | Source: Ambulatory Visit | Attending: Neurology | Admitting: Neurology

## 2018-10-29 ENCOUNTER — Encounter (HOSPITAL_COMMUNITY): Payer: Medicare HMO

## 2018-10-29 ENCOUNTER — Telehealth: Payer: Self-pay | Admitting: Neurology

## 2018-10-29 DIAGNOSIS — G35 Multiple sclerosis: Secondary | ICD-10-CM | POA: Insufficient documentation

## 2018-10-29 MED ORDER — SODIUM CHLORIDE 0.9 % IV SOLN
300.0000 mg | INTRAVENOUS | Status: DC
  Administered 2018-10-29: 14:00:00 300 mg via INTRAVENOUS
  Filled 2018-10-29: qty 15

## 2018-10-29 MED ORDER — SODIUM CHLORIDE 0.9 % IV SOLN
INTRAVENOUS | Status: DC | PRN
  Administered 2018-10-29: 250 mL via INTRAVENOUS

## 2018-10-29 MED ORDER — SODIUM CHLORIDE 0.9% FLUSH
10.0000 mL | INTRAVENOUS | Status: AC | PRN
  Administered 2018-10-29: 10 mL

## 2018-10-29 MED ORDER — HYDRALAZINE HCL 25 MG PO TABS
25.0000 mg | ORAL_TABLET | Freq: Once | ORAL | Status: AC
  Administered 2018-10-29: 25 mg via ORAL
  Filled 2018-10-29: qty 1

## 2018-10-29 MED ORDER — HEPARIN SOD (PORK) LOCK FLUSH 100 UNIT/ML IV SOLN
500.0000 [IU] | INTRAVENOUS | Status: AC | PRN
  Administered 2018-10-29: 17:00:00 500 [IU]
  Filled 2018-10-29: qty 5

## 2018-10-29 NOTE — Discharge Instructions (Signed)
Natalizumab injection What is this medicine? NATALIZUMAB (na ta LIZ you mab) is used to treat relapsing multiple sclerosis. This drug is not a cure. It is also used to treat Crohn's disease. This medicine may be used for other purposes; ask your health care provider or pharmacist if you have questions. COMMON BRAND NAME(S): Tysabri What should I tell my health care provider before I take this medicine? They need to know if you have any of these conditions:  immune system problems  progressive multifocal leukoencephalopathy (PML)  an unusual or allergic reaction to natalizumab, other medicines, foods, dyes, or preservatives  pregnant or trying to get pregnant  breast-feeding How should I use this medicine? This medicine is for infusion into a vein. It is given by a health care professional in a hospital or clinic setting. A special MedGuide will be given to you by the pharmacist with each prescription and refill. Be sure to read this information carefully each time. Talk to your pediatrician regarding the use of this medicine in children. This medicine is not approved for use in children. Overdosage: If you think you have taken too much of this medicine contact a poison control center or emergency room at once. NOTE: This medicine is only for you. Do not share this medicine with others. What if I miss a dose? It is important not to miss your dose. Call your doctor or health care professional if you are unable to keep an appointment. What may interact with this medicine?  azathioprine  cyclosporine  interferon  6-mercaptopurine  methotrexate  steroid medicines like prednisone or cortisone  TNF-alpha inhibitors like adalimumab, etanercept, and infliximab  vaccines This list may not describe all possible interactions. Give your health care provider a list of all the medicines, herbs, non-prescription drugs, or dietary supplements you use. Also tell them if you smoke, drink  alcohol, or use illegal drugs. Some items may interact with your medicine. What should I watch for while using this medicine? Your condition will be monitored carefully while you are receiving this medicine. Visit your doctor for regular check ups. Tell your doctor or healthcare professional if your symptoms do not start to get better or if they get worse. Stay away from people who are sick. Call your doctor or health care professional for advice if you get a fever, chills or sore throat, or other symptoms of a cold or flu. Do not treat yourself. In some patients, this medicine may cause a serious brain infection that may cause death. If you have any problems seeing, thinking, speaking, walking, or standing, tell your doctor right away. If you cannot reach your doctor, get urgent medical care. What side effects may I notice from receiving this medicine? Side effects that you should report to your doctor or health care professional as soon as possible:  allergic reactions like skin rash, itching or hives, swelling of the face, lips, or tongue  breathing problems  changes in vision  chest pain  dark urine  depression, feelings of sadness  dizziness  general ill feeling or flu-like symptoms  irregular, missed, or painful menstrual periods  light-colored stools  loss of appetite, nausea  muscle weakness  problems with balance, talking, or walking  right upper belly pain  unusually weak or tired  yellowing of the eyes or skin Side effects that usually do not require medical attention (report to your doctor or health care professional if they continue or are bothersome):  aches, pains  headache  stomach upset    tiredness This list may not describe all possible side effects. Call your doctor for medical advice about side effects. You may report side effects to FDA at 1-800-FDA-1088. Where should I keep my medicine? This drug is given in a hospital or clinic and will not be  stored at home. NOTE: This sheet is a summary. It may not cover all possible information. If you have questions about this medicine, talk to your doctor, pharmacist, or health care provider.  2020 Elsevier/Gold Standard (2008-03-04 13:33:21)  

## 2018-10-29 NOTE — Telephone Encounter (Signed)
Her BP has increased some to (162/88) during infusion (now complete)  She is on hydralazine 50 mg bid and carvedilol 25 mg bid.    I will have her get an additional dose of hydralazine 25 mg po before discharge.

## 2018-10-29 NOTE — Progress Notes (Addendum)
Patient received Tysabri via PIV. Pre infusion BP was 150/78. RN contacted Arlice Colt MD's office. Per MD RN should proceed with infusion. Post infusion BP was 162/88. Per MD, patient should be given 25 mg of Hydralazine and to discharge patient. Tolerated medication well. Discharge instructions given, verbalized understanding. Patient alert, oriented and ambulatory at the time of discharge.  Patient declined the 1 hour post infusion observation period.

## 2018-10-29 NOTE — Telephone Encounter (Signed)
[facility] center called:   BP is elevated at 150/78 and they asked if ok to proceed with Tysabri infusion.   I gave the go-ahead to proceed.

## 2018-11-26 ENCOUNTER — Ambulatory Visit (HOSPITAL_COMMUNITY)
Admission: RE | Admit: 2018-11-26 | Discharge: 2018-11-26 | Disposition: A | Discharge status: Home / Self Care | Payer: Medicare HMO | Source: Ambulatory Visit | Attending: Internal Medicine | Admitting: Internal Medicine

## 2018-11-26 ENCOUNTER — Other Ambulatory Visit: Payer: Self-pay

## 2018-11-26 DIAGNOSIS — G35 Multiple sclerosis: Secondary | ICD-10-CM | POA: Diagnosis not present

## 2018-11-26 MED ORDER — HEPARIN SOD (PORK) LOCK FLUSH 100 UNIT/ML IV SOLN
500.0000 [IU] | INTRAVENOUS | Status: AC | PRN
  Administered 2018-11-26: 14:00:00 500 [IU]
  Filled 2018-11-26: qty 5

## 2018-11-26 MED ORDER — SODIUM CHLORIDE 0.9 % IV SOLN
300.0000 mg | INTRAVENOUS | Status: DC
  Administered 2018-11-26: 300 mg via INTRAVENOUS
  Filled 2018-11-26: qty 15

## 2018-11-26 MED ORDER — SODIUM CHLORIDE 0.9% FLUSH
10.0000 mL | INTRAVENOUS | Status: AC | PRN
  Administered 2018-11-26: 10 mL

## 2018-11-26 MED ORDER — SODIUM CHLORIDE 0.9 % IV SOLN
INTRAVENOUS | Status: DC | PRN
  Administered 2018-11-26: 250 mL via INTRAVENOUS

## 2018-11-26 NOTE — Progress Notes (Signed)
Patient received 300 mg of Tysabri as ordered by Arlice Colt MD via an implanted port that was accessed and de-aaccessed per protocol. Patient declined the 60 minutes post infusion observation.Tolerated well, vitals stable, discharge instructions given, verbalized understanding. Patient alert, oriented and ambulatory at the time of discharge.

## 2018-11-26 NOTE — Discharge Instructions (Signed)
Natalizumab injection What is this medicine? NATALIZUMAB (na ta LIZ you mab) is used to treat relapsing multiple sclerosis. This drug is not a cure. It is also used to treat Crohn's disease. This medicine may be used for other purposes; ask your health care provider or pharmacist if you have questions. COMMON BRAND NAME(S): Tysabri What should I tell my health care provider before I take this medicine? They need to know if you have any of these conditions:  immune system problems  progressive multifocal leukoencephalopathy (PML)  an unusual or allergic reaction to natalizumab, other medicines, foods, dyes, or preservatives  pregnant or trying to get pregnant  breast-feeding How should I use this medicine? This medicine is for infusion into a vein. It is given by a health care professional in a hospital or clinic setting. A special MedGuide will be given to you by the pharmacist with each prescription and refill. Be sure to read this information carefully each time. Talk to your pediatrician regarding the use of this medicine in children. This medicine is not approved for use in children. Overdosage: If you think you have taken too much of this medicine contact a poison control center or emergency room at once. NOTE: This medicine is only for you. Do not share this medicine with others. What if I miss a dose? It is important not to miss your dose. Call your doctor or health care professional if you are unable to keep an appointment. What may interact with this medicine?  azathioprine  cyclosporine  interferon  6-mercaptopurine  methotrexate  steroid medicines like prednisone or cortisone  TNF-alpha inhibitors like adalimumab, etanercept, and infliximab  vaccines This list may not describe all possible interactions. Give your health care provider a list of all the medicines, herbs, non-prescription drugs, or dietary supplements you use. Also tell them if you smoke, drink  alcohol, or use illegal drugs. Some items may interact with your medicine. What should I watch for while using this medicine? Your condition will be monitored carefully while you are receiving this medicine. Visit your doctor for regular check ups. Tell your doctor or healthcare professional if your symptoms do not start to get better or if they get worse. Stay away from people who are sick. Call your doctor or health care professional for advice if you get a fever, chills or sore throat, or other symptoms of a cold or flu. Do not treat yourself. In some patients, this medicine may cause a serious brain infection that may cause death. If you have any problems seeing, thinking, speaking, walking, or standing, tell your doctor right away. If you cannot reach your doctor, get urgent medical care. What side effects may I notice from receiving this medicine? Side effects that you should report to your doctor or health care professional as soon as possible:  allergic reactions like skin rash, itching or hives, swelling of the face, lips, or tongue  breathing problems  changes in vision  chest pain  dark urine  depression, feelings of sadness  dizziness  general ill feeling or flu-like symptoms  irregular, missed, or painful menstrual periods  light-colored stools  loss of appetite, nausea  muscle weakness  problems with balance, talking, or walking  right upper belly pain  unusually weak or tired  yellowing of the eyes or skin Side effects that usually do not require medical attention (report to your doctor or health care professional if they continue or are bothersome):  aches, pains  headache  stomach upset    tiredness This list may not describe all possible side effects. Call your doctor for medical advice about side effects. You may report side effects to FDA at 1-800-FDA-1088. Where should I keep my medicine? This drug is given in a hospital or clinic and will not be  stored at home. NOTE: This sheet is a summary. It may not cover all possible information. If you have questions about this medicine, talk to your doctor, pharmacist, or health care provider.  2020 Elsevier/Gold Standard (2008-03-04 13:33:21)  

## 2018-12-06 ENCOUNTER — Telehealth: Payer: Self-pay | Admitting: Neurology

## 2018-12-06 NOTE — Telephone Encounter (Signed)
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 She stated pt is getting discharge home tomorrow and she had concerns. She has to take her temperature daily and be screen, wear a mask while caring for the pt. I ask if the pt was asymptomatic being discharge home. Rachel Gilbert she was told pt is doing well with no symptoms. I stated message will be sent to Dr.Sater for any of his recommendations. She verbalized understanding.

## 2018-12-06 NOTE — Telephone Encounter (Signed)
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  Pt states her client is being released from rehab and she is supposed to start caring for the pt on tomorrow.  Pt would like to know what Dr Felecia Shelling has to say about this for her.

## 2018-12-07 NOTE — Telephone Encounter (Signed)
I called pt to give her Dr. Leonie Man recommendations who had COVID 19 on 11/05/2018. I stated per Dr.Sater recommendations she should try to reduce exposure to an individual who had the COVID.They need to be symptom free for at least 24 hours and at least 10 days symptom free from first day. PT stated pt is symptom free and doing well. She is at home now. 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

## 2018-12-07 NOTE — Telephone Encounter (Signed)
She should try to reduce exposure to an individual who had Covid until: they are fever free for at least 24 hours AND at least 10 days from the first day of their symptoms

## 2018-12-21 ENCOUNTER — Telehealth: Payer: Self-pay

## 2018-12-21 NOTE — Telephone Encounter (Signed)
Ms touch authorization has been approved. Effective dates 12/21/2018-07/24/2019.  Site [place] # [id number]

## 2018-12-28 ENCOUNTER — Ambulatory Visit (HOSPITAL_COMMUNITY): Payer: Medicare HMO

## 2018-12-29 ENCOUNTER — Other Ambulatory Visit: Payer: Self-pay

## 2018-12-29 ENCOUNTER — Ambulatory Visit (HOSPITAL_COMMUNITY)
Admission: RE | Admit: 2018-12-29 | Discharge: 2018-12-29 | Disposition: A | Discharge status: Home / Self Care | Payer: Medicare HMO | Source: Ambulatory Visit | Attending: Neurology | Admitting: Neurology

## 2018-12-29 DIAGNOSIS — G35 Multiple sclerosis: Secondary | ICD-10-CM | POA: Diagnosis not present

## 2018-12-29 MED ORDER — HEPARIN SOD (PORK) LOCK FLUSH 100 UNIT/ML IV SOLN
500.0000 [IU] | INTRAVENOUS | Status: AC | PRN
  Administered 2018-12-29: 500 [IU]
  Filled 2018-12-29: qty 5

## 2018-12-29 MED ORDER — SODIUM CHLORIDE 0.9 % IV SOLN
300.0000 mg | Freq: Once | INTRAVENOUS | Status: AC
  Administered 2018-12-29: 11:00:00 300 mg via INTRAVENOUS
  Filled 2018-12-29: qty 15

## 2018-12-29 MED ORDER — HEPARIN SOD (PORK) LOCK FLUSH 100 UNIT/ML IV SOLN: 250.0000 [IU] | INTRAVENOUS | Status: DC | PRN

## 2018-12-29 MED ORDER — SODIUM CHLORIDE 0.9 % IV SOLN
INTRAVENOUS | Status: DC | PRN
  Administered 2018-12-29: 11:00:00 250 mL via INTRAVENOUS

## 2018-12-29 MED ORDER — SODIUM CHLORIDE 0.9% FLUSH
10.0000 mL | INTRAVENOUS | Status: AC | PRN
  Administered 2018-12-29: 12:00:00 10 mL

## 2018-12-29 NOTE — Discharge Instructions (Signed)
Natalizumab injection What is this medicine? NATALIZUMAB (na ta LIZ you mab) is used to treat relapsing multiple sclerosis. This drug is not a cure. It is also used to treat Crohn's disease. This medicine may be used for other purposes; ask your health care provider or pharmacist if you have questions. COMMON BRAND NAME(S): Tysabri What should I tell my health care provider before I take this medicine? They need to know if you have any of these conditions:  immune system problems  progressive multifocal leukoencephalopathy (PML)  an unusual or allergic reaction to natalizumab, other medicines, foods, dyes, or preservatives  pregnant or trying to get pregnant  breast-feeding How should I use this medicine? This medicine is for infusion into a vein. It is given by a health care professional in a hospital or clinic setting. A special MedGuide will be given to you by the pharmacist with each prescription and refill. Be sure to read this information carefully each time. Talk to your pediatrician regarding the use of this medicine in children. This medicine is not approved for use in children. Overdosage: If you think you have taken too much of this medicine contact a poison control center or emergency room at once. NOTE: This medicine is only for you. Do not share this medicine with others. What if I miss a dose? It is important not to miss your dose. Call your doctor or health care professional if you are unable to keep an appointment. What may interact with this medicine?  azathioprine  cyclosporine  interferon  6-mercaptopurine  methotrexate  steroid medicines like prednisone or cortisone  TNF-alpha inhibitors like adalimumab, etanercept, and infliximab  vaccines This list may not describe all possible interactions. Give your health care provider a list of all the medicines, herbs, non-prescription drugs, or dietary supplements you use. Also tell them if you smoke, drink  alcohol, or use illegal drugs. Some items may interact with your medicine. What should I watch for while using this medicine? Your condition will be monitored carefully while you are receiving this medicine. Visit your doctor for regular check ups. Tell your doctor or healthcare professional if your symptoms do not start to get better or if they get worse. Stay away from people who are sick. Call your doctor or health care professional for advice if you get a fever, chills or sore throat, or other symptoms of a cold or flu. Do not treat yourself. In some patients, this medicine may cause a serious brain infection that may cause death. If you have any problems seeing, thinking, speaking, walking, or standing, tell your doctor right away. If you cannot reach your doctor, get urgent medical care. What side effects may I notice from receiving this medicine? Side effects that you should report to your doctor or health care professional as soon as possible:  allergic reactions like skin rash, itching or hives, swelling of the face, lips, or tongue  breathing problems  changes in vision  chest pain  dark urine  depression, feelings of sadness  dizziness  general ill feeling or flu-like symptoms  irregular, missed, or painful menstrual periods  light-colored stools  loss of appetite, nausea  muscle weakness  problems with balance, talking, or walking  right upper belly pain  unusually weak or tired  yellowing of the eyes or skin Side effects that usually do not require medical attention (report to your doctor or health care professional if they continue or are bothersome):  aches, pains  headache  stomach upset    tiredness This list may not describe all possible side effects. Call your doctor for medical advice about side effects. You may report side effects to FDA at 1-800-FDA-1088. Where should I keep my medicine? This drug is given in a hospital or clinic and will not be  stored at home. NOTE: This sheet is a summary. It may not cover all possible information. If you have questions about this medicine, talk to your doctor, pharmacist, or health care provider.  2020 Elsevier/Gold Standard (2008-03-04 13:33:21)  

## 2018-12-29 NOTE — Progress Notes (Addendum)
Patient received IV Tysabri as ordered by Arlice Colt MD. Patient declined 60 minutes post infusion observation.Tolerated well, vitals stable, discharge instructions given, verbalized understanding. Patient alert, oriented and ambulatory at the time of discharge.

## 2019-01-26 ENCOUNTER — Other Ambulatory Visit: Payer: Self-pay

## 2019-01-26 ENCOUNTER — Ambulatory Visit (HOSPITAL_COMMUNITY)
Admission: RE | Admit: 2019-01-26 | Discharge: 2019-01-26 | Disposition: A | Discharge status: Home / Self Care | Payer: Medicare HMO | Source: Ambulatory Visit | Attending: Internal Medicine | Admitting: Internal Medicine

## 2019-01-26 DIAGNOSIS — G35 Multiple sclerosis: Secondary | ICD-10-CM | POA: Diagnosis not present

## 2019-01-26 MED ORDER — SODIUM CHLORIDE 0.9 % IV SOLN
INTRAVENOUS | Status: DC | PRN
  Administered 2019-01-26: 250 mL via INTRAVENOUS

## 2019-01-26 MED ORDER — SODIUM CHLORIDE 0.9 % IV SOLN
300.0000 mg | Freq: Once | INTRAVENOUS | Status: AC
  Administered 2019-01-26: 300 mg via INTRAVENOUS
  Filled 2019-01-26: qty 15

## 2019-01-26 MED ORDER — HEPARIN SOD (PORK) LOCK FLUSH 100 UNIT/ML IV SOLN
500.0000 [IU] | INTRAVENOUS | Status: AC | PRN
  Administered 2019-01-26: 500 [IU]
  Filled 2019-01-26: qty 5

## 2019-01-26 MED ORDER — SODIUM CHLORIDE 0.9% FLUSH
10.0000 mL | INTRAVENOUS | Status: AC | PRN
  Administered 2019-01-26: 10 mL

## 2019-01-26 NOTE — Progress Notes (Signed)
PATIENT CARE CENTER NOTE  Diagnosis:Multiple Sclerosis   Provider:Sater, Richard MD   Procedure:Tysabri infusion   Note:Patient received Tysabri infusion. Tolerated well with no adverse reaction. Vital signs remained stable. Patientdid not wantto remain for the 1 hour post-infusion. Alert, oriented and ambulatory at discharge.

## 2019-01-26 NOTE — Discharge Instructions (Signed)
Natalizumab injection What is this medicine? NATALIZUMAB (na ta LIZ you mab) is used to treat relapsing multiple sclerosis. This drug is not a cure. It is also used to treat Crohn's disease. This medicine may be used for other purposes; ask your health care provider or pharmacist if you have questions. COMMON BRAND NAME(S): Tysabri What should I tell my health care provider before I take this medicine? They need to know if you have any of these conditions:  immune system problems  progressive multifocal leukoencephalopathy (PML)  an unusual or allergic reaction to natalizumab, other medicines, foods, dyes, or preservatives  pregnant or trying to get pregnant  breast-feeding How should I use this medicine? This medicine is for infusion into a vein. It is given by a health care professional in a hospital or clinic setting. A special MedGuide will be given to you by the pharmacist with each prescription and refill. Be sure to read this information carefully each time. Talk to your pediatrician regarding the use of this medicine in children. This medicine is not approved for use in children. Overdosage: If you think you have taken too much of this medicine contact a poison control center or emergency room at once. NOTE: This medicine is only for you. Do not share this medicine with others. What if I miss a dose? It is important not to miss your dose. Call your doctor or health care professional if you are unable to keep an appointment. What may interact with this medicine?  azathioprine  cyclosporine  interferon  6-mercaptopurine  methotrexate  steroid medicines like prednisone or cortisone  TNF-alpha inhibitors like adalimumab, etanercept, and infliximab  vaccines This list may not describe all possible interactions. Give your health care provider a list of all the medicines, herbs, non-prescription drugs, or dietary supplements you use. Also tell them if you smoke, drink  alcohol, or use illegal drugs. Some items may interact with your medicine. What should I watch for while using this medicine? Your condition will be monitored carefully while you are receiving this medicine. Visit your doctor for regular check ups. Tell your doctor or healthcare professional if your symptoms do not start to get better or if they get worse. Stay away from people who are sick. Call your doctor or health care professional for advice if you get a fever, chills or sore throat, or other symptoms of a cold or flu. Do not treat yourself. In some patients, this medicine may cause a serious brain infection that may cause death. If you have any problems seeing, thinking, speaking, walking, or standing, tell your doctor right away. If you cannot reach your doctor, get urgent medical care. What side effects may I notice from receiving this medicine? Side effects that you should report to your doctor or health care professional as soon as possible:  allergic reactions like skin rash, itching or hives, swelling of the face, lips, or tongue  breathing problems  changes in vision  chest pain  dark urine  depression, feelings of sadness  dizziness  general ill feeling or flu-like symptoms  irregular, missed, or painful menstrual periods  light-colored stools  loss of appetite, nausea  muscle weakness  problems with balance, talking, or walking  right upper belly pain  unusually weak or tired  yellowing of the eyes or skin Side effects that usually do not require medical attention (report to your doctor or health care professional if they continue or are bothersome):  aches, pains  headache  stomach upset    tiredness This list may not describe all possible side effects. Call your doctor for medical advice about side effects. You may report side effects to FDA at 1-800-FDA-1088. Where should I keep my medicine? This drug is given in a hospital or clinic and will not be  stored at home. NOTE: This sheet is a summary. It may not cover all possible information. If you have questions about this medicine, talk to your doctor, pharmacist, or health care provider.  2020 Elsevier/Gold Standard (2008-03-04 13:33:21)  

## 2019-02-01 DIAGNOSIS — Z6829 Body mass index (BMI) 29.0-29.9, adult: Secondary | ICD-10-CM | POA: Insufficient documentation

## 2019-02-07 ENCOUNTER — Telehealth: Payer: Self-pay | Admitting: Neurology

## 2019-02-07 NOTE — Telephone Encounter (Signed)
Noted, we will work on this 

## 2019-02-07 NOTE — Telephone Encounter (Signed)
Pt called stating that RN is needing to get an authorization for the next 6 months for her natalizumab (TYSABRI) 300 MG/15ML injection Pt states her next infusion is on Jan. 29th Please advise.

## 2019-02-08 NOTE — Telephone Encounter (Signed)
PA approved from 02/14/19-08/14/19. [id number]. Faxed copy of approval letter to [facility] at [phone]. Received fax confirmation.

## 2019-02-08 NOTE — Telephone Encounter (Addendum)
Submitted PA on CMM. SAY:[id number]. Waiting on determination from [facility].

## 2019-02-09 DIAGNOSIS — H9190 Unspecified hearing loss, unspecified ear: Secondary | ICD-10-CM | POA: Insufficient documentation

## 2019-02-11 ENCOUNTER — Other Ambulatory Visit: Payer: Self-pay

## 2019-02-11 ENCOUNTER — Emergency Department
Admission: EM | Admit: 2019-02-11 | Discharge: 2019-02-11 | Disposition: A | Discharge status: Home / Self Care | Payer: Medicare HMO | Attending: Emergency Medicine | Admitting: Emergency Medicine

## 2019-02-11 ENCOUNTER — Emergency Department: Payer: Medicare HMO

## 2019-02-11 ENCOUNTER — Encounter: Payer: Self-pay | Admitting: Emergency Medicine

## 2019-02-11 DIAGNOSIS — Z8673 Personal history of transient ischemic attack (TIA), and cerebral infarction without residual deficits: Secondary | ICD-10-CM | POA: Diagnosis not present

## 2019-02-11 DIAGNOSIS — N390 Urinary tract infection, site not specified: Secondary | ICD-10-CM | POA: Insufficient documentation

## 2019-02-11 DIAGNOSIS — I10 Essential (primary) hypertension: Secondary | ICD-10-CM | POA: Insufficient documentation

## 2019-02-11 DIAGNOSIS — R109 Unspecified abdominal pain: Secondary | ICD-10-CM | POA: Diagnosis present

## 2019-02-11 DIAGNOSIS — Z7982 Long term (current) use of aspirin: Secondary | ICD-10-CM | POA: Diagnosis not present

## 2019-02-11 DIAGNOSIS — Z7901 Long term (current) use of anticoagulants: Secondary | ICD-10-CM | POA: Insufficient documentation

## 2019-02-11 DIAGNOSIS — Z79899 Other long term (current) drug therapy: Secondary | ICD-10-CM | POA: Insufficient documentation

## 2019-02-11 LAB — COMPREHENSIVE METABOLIC PANEL
ALT: 57 U/L — ABNORMAL HIGH (ref 0–44)
AST: 30 U/L (ref 15–41)
Albumin: 4 g/dL (ref 3.5–5.0)
Alkaline Phosphatase: 139 U/L — ABNORMAL HIGH (ref 38–126)
Anion gap: 9 (ref 5–15)
BUN: 17 mg/dL (ref 6–20)
CO2: 25 mmol/L (ref 22–32)
Calcium: 9 mg/dL (ref 8.9–10.3)
Chloride: 109 mmol/L (ref 98–111)
Creatinine, Ser: 1.25 mg/dL — ABNORMAL HIGH (ref 0.44–1.00)
GFR calc Af Amer: 55 mL/min — ABNORMAL LOW (ref >60)
GFR calc non Af Amer: 48 mL/min — ABNORMAL LOW (ref >60)
Glucose, Bld: 100 mg/dL — ABNORMAL HIGH (ref 70–99)
Potassium: 4.4 mmol/L (ref 3.5–5.1)
Sodium: 143 mmol/L (ref 135–145)
Total Bilirubin: 0.9 mg/dL (ref 0.3–1.2)
Total Protein: 6.3 g/dL — ABNORMAL LOW (ref 6.5–8.1)

## 2019-02-11 LAB — URINALYSIS, COMPLETE (UACMP) WITH MICROSCOPIC
Bacteria, UA: NONE SEEN
Bilirubin Urine: NEGATIVE
Glucose, UA: NEGATIVE mg/dL
Ketones, ur: NEGATIVE mg/dL
Nitrite: NEGATIVE
Protein, ur: NEGATIVE mg/dL
Specific Gravity, Urine: 1.024 (ref 1.005–1.030)
pH: 5 (ref 5.0–8.0)

## 2019-02-11 LAB — CBC WITH DIFFERENTIAL/PLATELET
Abs Immature Granulocytes: 0.03 10*3/uL (ref 0.00–0.07)
Basophils Absolute: 0.1 10*3/uL (ref 0.0–0.1)
Basophils Relative: 1 %
Eosinophils Absolute: 0.4 10*3/uL (ref 0.0–0.5)
Eosinophils Relative: 5 %
HCT: 40.2 % (ref 36.0–46.0)
Hemoglobin: 12.5 g/dL (ref 12.0–15.0)
Immature Granulocytes: 0 %
Lymphocytes Relative: 32 %
Lymphs Abs: 2.4 10*3/uL (ref 0.7–4.0)
MCH: 28.7 pg (ref 26.0–34.0)
MCHC: 31.1 g/dL (ref 30.0–36.0)
MCV: 92.4 fL (ref 80.0–100.0)
Monocytes Absolute: 0.6 10*3/uL (ref 0.1–1.0)
Monocytes Relative: 8 %
Neutro Abs: 4.1 10*3/uL (ref 1.7–7.7)
Neutrophils Relative %: 54 %
Platelets: 216 10*3/uL (ref 150–400)
RBC: 4.35 MIL/uL (ref 3.87–5.11)
RDW: 14.9 % (ref 11.5–15.5)
WBC: 7.6 10*3/uL (ref 4.0–10.5)
nRBC: 0 % (ref 0.0–0.2)

## 2019-02-11 MED ORDER — HYDROCODONE-ACETAMINOPHEN 5-325 MG PO TABS: 1.0000 | ORAL_TABLET | Freq: Four times a day (QID) | ORAL | 0 refills | Status: DC | PRN

## 2019-02-11 MED ORDER — CEPHALEXIN 500 MG PO CAPS: 500.0000 mg | ORAL_CAPSULE | Freq: Three times a day (TID) | ORAL | 0 refills | Status: DC

## 2019-02-11 NOTE — ED Provider Notes (Signed)
[facility] Emergency Department Provider Note  ____________________________________________   None    (approximate)  I have reviewed the triage vital signs and the nursing notes.   HISTORY  Chief Complaint Flank Pain   HPI Rachel Gilbert is a 59 y.o. female presents to the ED with complaint of right flank pain that began 5 days ago.  Patient states that she has some urinary urgency along with her flank pain.  She denies any recent nausea or vomiting.  Patient states that she called her PCP and has been taking Bactrim twice daily for the last 3 days without any improvement.  She reports that she does have a history of kidney stones with the last 1 being approximately 10 years ago.  She rates her pain as a 10/10.       Past Medical History:  Diagnosis Date  . Complication of anesthesia    hard time waking up   . Hearing loss   . Hypertension   . Kidney stones   . Multiple sclerosis ([place])   . Sleep apnea    Uses CPAP  . Stroke ([place]) 03/2017  . Vision abnormalities     Patient Active Problem List   Diagnosis Date Noted  . Restless leg syndrome 08/26/2018  . Pedal edema 08/06/2017  . High risk medication use 06/05/2017  . Abnormal laboratory test result 06/05/2017  . Aphasia 05/07/2017  . Dysphagia   . CVA (cerebral vascular accident) ([place]) 04/13/2017  . TIA (transient ischemic attack) 04/08/2017  . Attention deficit 11/06/2016  . 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 Insomnia 11/16/2014  . Other fatigue 07/03/2014  . Urinary frequency 07/03/2014  . Gait disturbance 06/14/2014  . Depression with anxiety 06/14/2014  . Vitamin D deficiency 06/14/2014  . Multiple sclerosis exacerbation ([place]) 06/04/2014  . Essential hypertension 06/04/2014  . Obesity 06/04/2014  . Obstructive sleep apnea on CPAP 06/04/2014  . Gastroesophageal reflux disease without esophagitis 06/04/2014  . Multiple sclerosis ([place]) 06/04/2014    Past Surgical History:    Procedure Laterality Date  . CESAREAN SECTION  1986  . CHOLECYSTECTOMY  2000   laparoscopic  . IR FLUORO GUIDED NEEDLE PLC ASPIRATION/INJECTION LOC  04/15/2017  . IR IMAGING GUIDED PORT INSERTION  10/20/2018  . LAPAROSCOPIC GASTRIC RESTRICTIVE DUODENAL PROCEDURE (DUODENAL SWITCH)    . RADIOLOGY WITH ANESTHESIA N/A 04/15/2017   Procedure: LUMBER PUNCTURE MRI SCAN;  Surgeon: Radiologist, Medication, MD;  Location: [facility];  Service: Radiology;  Laterality: N/A;  . TEE WITHOUT CARDIOVERSION N/A 04/16/2017   Procedure: TRANSESOPHAGEAL ECHOCARDIOGRAM (TEE) WITH LOOP;  Surgeon: Josue Hector, MD;  Location: [facility] ENDOSCOPY;  Service: Cardiovascular;  Laterality: N/A;    Prior to Admission medications   Medication Sig Start Date End Date Taking? Authorizing Provider  ALPRAZolam Duanne Moron) 0.5 MG tablet Take 0.5 mg by mouth 2 (two) times daily as needed for anxiety.    [provider]  aspirin EC 81 MG EC tablet Take 1 tablet (81 mg total) by mouth daily. 04/12/17   Hillary Bow, MD  atorvastatin (LIPITOR) 80 MG tablet Take 1 tablet (80 mg total) by mouth daily at 6 PM. 04/21/17   Bonnell Public, MD  Calcium-Vitamin D (CALTRATE 600 PLUS-VIT D PO) Take 4 tablets by mouth daily.    [provider]  carvedilol (COREG) 25 MG tablet Take 25 mg by mouth 2 (two) times daily with a meal.    [provider]  cephALEXin (KEFLEX) 500 MG capsule Take 1 capsule (  500 mg total) by mouth 3 (three) times daily. 02/11/19   Tommi Rumps, PA-C  clopidogrel (PLAVIX) 75 MG tablet Take 1 tablet (75 mg total) by mouth daily. 05/26/18   Sater, Pearletha Furl, MD  escitalopram (LEXAPRO) 20 MG tablet Take 20 mg by mouth daily.    [provider]  gabapentin (NEURONTIN) 300 MG capsule Take one or two at bedtime Patient taking differently: Take 600 mg by mouth at bedtime.  08/26/18   Sater, Pearletha Furl, MD  hydrALAZINE (APRESOLINE) 50 MG tablet Take 50 mg by mouth 2 (two) times daily.    [provider]  HYDROcodone-acetaminophen (NORCO/VICODIN) 5-325 MG tablet Take 1 tablet by mouth every 6 (six) hours as needed for moderate pain. 02/11/19   Tommi Rumps, PA-C  lidocaine-prilocaine (EMLA) cream Apply 1 application topically as needed. 10/20/18   Sater, Pearletha Furl, MD  Multiple Vitamins-Minerals (HAIR SKIN & NAILS ADVANCED PO) Take 1 tablet by mouth daily.     [provider]  natalizumab (TYSABRI) 300 MG/15ML injection Inject 15 mLs into the vein every 28 (twenty-eight) days.     [provider]  ondansetron (ZOFRAN-ODT) 4 MG disintegrating tablet Take 4 mg by mouth every 8 (eight) hours as needed for nausea or vomiting.    [provider]  oxybutynin (DITROPAN) 5 MG tablet Take 1 tablet (5 mg total) by mouth 2 (two) times daily. Patient taking differently: Take 10 mg by mouth at bedtime.  08/30/18   Sater, Pearletha Furl, MD  pantoprazole (PROTONIX) 20 MG tablet Take 20 mg by mouth daily.    [provider]  potassium chloride (MICRO-K) 10 MEQ CR capsule Take 10 mEq by mouth daily.     [provider]  UNABLE TO FIND Take 2 tablets by mouth daily. Med Name: Bariatric chewable Advanced Multi EA    [provider]    Allergies Darvon [propoxyphene], Oxycodone, and Percocet [oxycodone-acetaminophen]  Family History  Problem Relation Age of Onset  . Fibromyalgia Mother   . Stroke Father   . Diabetes Father   . Stroke Other     Social History Social History   Tobacco Use  . Smoking status: Never Smoker  . Smokeless tobacco: Never Used  Substance Use Topics  . Alcohol use: Yes    Comment: Occasional  . Drug use: No    Review of Systems Constitutional: No fever/chills Cardiovascular: Denies chest pain. Respiratory: Denies shortness of breath. Gastrointestinal: No abdominal pain.  No nausea, no vomiting.  Positive for right flank pain. Genitourinary: Positive for dysuria and frequency. Musculoskeletal: Negative for  back pain. Skin: Negative for rash. Neurological: Negative for headaches, focal weakness or numbness. ____________________________________________   PHYSICAL EXAM:  VITAL SIGNS: ED Triage Vitals [02/11/19 0513]  Enc Vitals Group     BP      Pulse      Resp      Temp      Temp src      SpO2      Weight 200 lb (90.7 kg)     Height 5\' 8"  (1.727 m)     Head Circumference      Peak Flow      Pain Score 10     Pain Loc      Pain Edu?      Excl. in GC?     Constitutional: Alert and oriented. Well appearing and in no acute distress. Eyes: Conjunctivae are normal.  Head: Atraumatic. Neck: No stridor.  Cardiovascular: Normal rate, regular rhythm. Grossly normal heart sounds.  Good peripheral circulation. Respiratory: Normal respiratory effort.  No retractions. Lungs CTAB. Gastrointestinal: Soft and nontender. No distention.  Positive right CVA tenderness. Musculoskeletal: Negative for muscle aches. Neurologic:  Normal speech and language. No gross focal neurologic deficits are appreciated. No gait instability. Skin:  Skin is warm, dry and intact. No rash noted. Psychiatric: Mood and affect are normal. Speech and behavior are normal.  ____________________________________________   LABS (all labs ordered are listed, but only abnormal results are displayed)  Labs Reviewed  URINALYSIS, COMPLETE (UACMP) WITH MICROSCOPIC - Abnormal; Notable for the following components:      Result Value   Color, Urine YELLOW (*)    APPearance TURBID (*)    Hgb urine dipstick SMALL (*)    Leukocytes,Ua TRACE (*)    All other components within normal limits  COMPREHENSIVE METABOLIC PANEL - Abnormal; Notable for the following components:   Glucose, Bld 100 (*)    Creatinine, Ser 1.25 (*)    Total Protein 6.3 (*)    ALT 57 (*)    Alkaline Phosphatase 139 (*)    GFR calc non Af Amer 48 (*)    GFR calc Af Amer 55 (*)    All other components within normal limits  CBC WITH DIFFERENTIAL/PLATELET      RADIOLOGY  Official radiology report(s): CT Renal Stone Study  Result Date: 02/11/2019 CLINICAL DATA:  Right flank pain.  Microhematuria EXAM: CT ABDOMEN AND PELVIS WITHOUT CONTRAST TECHNIQUE: Multidetector CT imaging of the abdomen and pelvis was performed following the standard protocol without oral or IV contrast. COMPARISON:  None. FINDINGS: Lower chest: There is minimal scarring in the inferior lingula. Lung bases otherwise are clear. Hepatobiliary: No focal liver lesions are evident. Gallbladder is absent. There is no appreciable biliary duct dilatation. Pancreas: There is no pancreatic mass or inflammatory focus. Spleen: No splenic lesions are evident. A small accessory spleen is noted anterior to the spleen. Adrenals/Urinary Tract: Adrenals bilaterally appear unremarkable. There is fetal lobulation in each kidney, an anatomic variant. There is no renal mass or hydronephrosis on either side. There is no evident renal or ureteral calculus on either side. Urinary bladder is midline with wall thickness within normal limits. Stomach/Bowel: There is evidence of previous gastric bypass procedure. There is no wall thickening or fluid in postoperative areas. Bowel anastomoses appear patent. There is no appreciable bowel wall or mesenteric thickening. No evident bowel obstruction. Terminal ileum appears normal. No evident free air or portal venous air. Vascular/Lymphatic: No abdominal aortic aneurysm. There are scattered foci of aortic atherosclerotic calcification. No adenopathy is evident in the abdomen or pelvis. Reproductive: Uterus is anteverted. There is an apparent intrauterine device within the endometrium. No pelvic mass evident. Other: Appendix appears unremarkable. No evident abscess or ascites in the abdomen or pelvis. Musculoskeletal: There is degenerative change in the lumbar spine. No blastic or lytic bone lesions. No intramuscular or abdominal wall lesions are evident. IMPRESSION: 1. No  hydronephrosis on either side. No renal or ureteral calculus. Urinary bladder wall thickness normal. A cause for hematuria has not been established with this study. 2. 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 No bowel obstruction. No abscess in the abdomen pelvis. Appendix appears normal. 3.  Gallbladder absent. 4.  Apparent intrauterine device within the endometrium. 5.  Aortic Atherosclerosis (ICD10-I70.0). Electronically Signed   By: Bretta Bang III M.D.   On: 02/11/2019 08:53    ____________________________________________   PROCEDURES  Procedure(s) performed (including Critical Care):  Procedures   ____________________________________________   INITIAL IMPRESSION / ASSESSMENT AND PLAN / ED COURSE  As part of my medical decision making, I reviewed the following data within the electronic MEDICAL RECORD NUMBER Notes from prior ED visits and [place] Controlled Substance Database Camron Essman was evaluated in Emergency Department on 02/11/2019 for the symptoms described in the history of present illness. She was evaluated in the context of the global COVID-19 pandemic, which necessitated consideration that the patient might be at risk for infection with the SARS-CoV-2 virus that causes COVID-19. Institutional protocols and algorithms that pertain to the evaluation of patients at risk for COVID-19 are in a state of rapid change based on information released by regulatory bodies including the CDC and federal and state organizations. These policies and algorithms were followed during the patient's care in the ED.  59 year old female presents to the ED with complaint of 4 days right flank pain and dysuria.  Patient is unaware of any fever and denies chills.  She has had a history of kidney stones in the past.  Her PCP started her on Bactrim 3 days ago which she states she has taken religiously and not improving.  Urinalysis was suspicious for a stone however CT scan does not  show any stones at this time.  Patient will discontinue taking the Bactrim and begin taking cephalexin 500 mg 3 times daily for 10 days.  She also was given a prescription for Norco 1 every 6 hours as needed for pain.  She states that she has taken Tussionex in the past without any allergic reactions as she does with oxycodone.  She is encouraged to increase fluids.  She will follow-up with her PCP if any continued problems.  She is instructed to return to the ED over the weekend if any worsening of her symptoms.  ____________________________________________   FINAL CLINICAL IMPRESSION(S) / ED DIAGNOSES  Final diagnoses:  Acute UTI (urinary tract infection)  Right flank pain     ED Discharge Orders         Ordered    cephALEXin (KEFLEX) 500 MG capsule  3 times daily     02/11/19 0933    HYDROcodone-acetaminophen (NORCO/VICODIN) 5-325 MG tablet  Every 6 hours PRN     02/11/19 0933           Note:  This document was prepared using Dragon voice recognition software and may include unintentional dictation errors.    Tommi Rumps, PA-C 02/11/19 4239    Minna Antis, MD 02/11/19 1416

## 2019-02-11 NOTE — Discharge Instructions (Addendum)
Follow-up with your primary care provider if any continued problems.  Increase fluids.  Discontinue taking the antibiotic that you are currently taking.  Begin taking the antibiotic that was sent to your pharmacy along with the pain medication as directed.  CT scan shows no kidney stone today so this appears to be a urinary tract infection.  Return to the emergency department over the weekend if any severe worsening of your symptoms.

## 2019-02-11 NOTE — ED Triage Notes (Addendum)
Patient ambulatory to triage with steady gait, without difficulty or distress noted, mask in place; pt reports rt flank pain,nonradiating, since Sunday; st pain worse now with urinary urgency; st has hx kidney stones; pt has portacath for MS infusions but agrees to periph blood draw at this time

## 2019-02-11 NOTE — ED Notes (Signed)

## 2019-02-25 ENCOUNTER — Ambulatory Visit (HOSPITAL_COMMUNITY)
Admission: RE | Admit: 2019-02-25 | Discharge: 2019-02-25 | Disposition: A | Discharge status: Home / Self Care | Payer: Medicare HMO | Source: Ambulatory Visit | Attending: Neurology | Admitting: Neurology

## 2019-02-25 ENCOUNTER — Other Ambulatory Visit: Payer: Self-pay

## 2019-02-25 DIAGNOSIS — G35 Multiple sclerosis: Secondary | ICD-10-CM | POA: Diagnosis not present

## 2019-02-25 MED ORDER — SODIUM CHLORIDE 0.9 % IV SOLN: INTRAVENOUS | Status: DC | PRN

## 2019-02-25 MED ORDER — HEPARIN SOD (PORK) LOCK FLUSH 100 UNIT/ML IV SOLN
500.0000 [IU] | INTRAVENOUS | Status: AC | PRN
  Administered 2019-02-25: 500 [IU]
  Filled 2019-02-25: qty 5

## 2019-02-25 MED ORDER — SODIUM CHLORIDE 0.9% FLUSH
10.0000 mL | INTRAVENOUS | Status: AC | PRN
  Administered 2019-02-25: 11:00:00 10 mL

## 2019-02-25 MED ORDER — SODIUM CHLORIDE 0.9 % IV SOLN
300.0000 mg | Freq: Once | INTRAVENOUS | Status: AC
  Administered 2019-02-25: 300 mg via INTRAVENOUS
  Filled 2019-02-25: qty 15

## 2019-02-25 NOTE — Discharge Instructions (Signed)
Natalizumab injection What is this medicine? NATALIZUMAB (na ta LIZ you mab) is used to treat relapsing multiple sclerosis. This drug is not a cure. It is also used to treat Crohn's disease. This medicine may be used for other purposes; ask your health care provider or pharmacist if you have questions. COMMON BRAND NAME(S): Tysabri What should I tell my health care provider before I take this medicine? They need to know if you have any of these conditions:  immune system problems  progressive multifocal leukoencephalopathy (PML)  an unusual or allergic reaction to natalizumab, other medicines, foods, dyes, or preservatives  pregnant or trying to get pregnant  breast-feeding How should I use this medicine? This medicine is for infusion into a vein. It is given by a health care professional in a hospital or clinic setting. A special MedGuide will be given to you by the pharmacist with each prescription and refill. Be sure to read this information carefully each time. Talk to your pediatrician regarding the use of this medicine in children. This medicine is not approved for use in children. Overdosage: If you think you have taken too much of this medicine contact a poison control center or emergency room at once. NOTE: This medicine is only for you. Do not share this medicine with others. What if I miss a dose? It is important not to miss your dose. Call your doctor or health care professional if you are unable to keep an appointment. What may interact with this medicine? Do not take this medicine with any of the following medications:  biologic medicines such as adalimumab, certolizumab, etanercept, golimumab, infliximab This medicine may also interact with the following medications:  azathioprine  cyclosporine  interferons  6-mercaptopurine  methotrexate  other medicines that lower your chance of fighting an infection  steroid medicines like prednisone or  cortisone  vaccines This list may not describe all possible interactions. Give your health care provider a list of all the medicines, herbs, non-prescription drugs, or dietary supplements you use. Also tell them if you smoke, drink alcohol, or use illegal drugs. Some items may interact with your medicine. What should I watch for while using this medicine? Your condition will be monitored carefully while you are receiving this medicine. Visit your doctor for regular check ups. Tell your doctor or healthcare professional if your symptoms do not start to get better or if they get worse. Stay away from people who are sick. Call your doctor or health care professional for advice if you get a fever, chills or sore throat, or other symptoms of a cold or flu. Do not treat yourself. In some patients, this medicine may cause a serious brain infection that may cause death. If you have any problems seeing, thinking, speaking, walking, or standing, tell your doctor right away. If you cannot reach your doctor, get urgent medical care. What side effects may I notice from receiving this medicine? Side effects that you should report to your doctor or health care professional as soon as possible:  allergic reactions like skin rash, itching or hives, swelling of the face, lips, or tongue  breathing problems  changes in vision  chest pain  confusion  depressed mood  dizziness  feeling faint; lightheaded; falls  general ill feeling or flu-like symptoms  loss of memory  missed menstrual periods  muscle weakness  problems with balance, talking, or walking  signs and symptoms of liver injury like dark yellow or brown urine; general ill feeling or flu-like symptoms; light-colored   stools; loss of appetite; nausea; right upper belly pain; unusually weak or tired; yellowing of the eyes or skin  suicidal thoughts, mood changes  unusual bruising or bleeding  unusually weak or tired Side effects that  usually do not require medical attention (report to your doctor or health care professional if they continue or are bothersome):  headache  joint pain  muscle cramps  muscle pain  nausea, vomiting  pain, redness, or irritation at site where injected  tiredness This list may not describe all possible side effects. Call your doctor for medical advice about side effects. You may report side effects to FDA at 1-800-FDA-1088. Where should I keep my medicine? This drug is given in a hospital or clinic and will not be stored at home. NOTE: This sheet is a summary. It may not cover all possible information. If you have questions about this medicine, talk to your doctor, pharmacist, or health care provider.  2020 Elsevier/Gold Standard (2018-07-19 13:20:26)  

## 2019-02-25 NOTE — Progress Notes (Signed)
Patient received300 mg of Tysabri as ordered by Sater Richard MDvia an implanted port that was accessed and de-accessed per protocol.Patient declined the 60minutes post infusion observation.Tolerated well, vitals stable, discharge instructions given, verbalized understanding. Patient alert, oriented and ambulatory at the time of discharge. 

## 2019-02-28 ENCOUNTER — Encounter: Payer: Self-pay | Admitting: Neurology

## 2019-02-28 ENCOUNTER — Other Ambulatory Visit: Payer: Self-pay

## 2019-02-28 ENCOUNTER — Telehealth: Payer: Self-pay | Admitting: *Deleted

## 2019-02-28 ENCOUNTER — Ambulatory Visit: Payer: Medicare HMO | Admitting: Neurology

## 2019-02-28 VITALS — BP 145/88 | HR 64 | Temp 97.5°F | Ht 68.0 in | Wt 204.5 lb

## 2019-02-28 DIAGNOSIS — G4733 Obstructive sleep apnea (adult) (pediatric): Secondary | ICD-10-CM

## 2019-02-28 DIAGNOSIS — F418 Other specified anxiety disorders: Secondary | ICD-10-CM

## 2019-02-28 DIAGNOSIS — G2581 Restless legs syndrome: Secondary | ICD-10-CM

## 2019-02-28 DIAGNOSIS — Z79899 Other long term (current) drug therapy: Secondary | ICD-10-CM

## 2019-02-28 DIAGNOSIS — G35 Multiple sclerosis: Secondary | ICD-10-CM

## 2019-02-28 DIAGNOSIS — G35D Multiple sclerosis, unspecified: Secondary | ICD-10-CM

## 2019-02-28 DIAGNOSIS — I6300 Cerebral infarction due to thrombosis of unspecified precerebral artery: Secondary | ICD-10-CM

## 2019-02-28 DIAGNOSIS — Z9989 Dependence on other enabling machines and devices: Secondary | ICD-10-CM

## 2019-02-28 DIAGNOSIS — I878 Other specified disorders of veins: Secondary | ICD-10-CM | POA: Insufficient documentation

## 2019-02-28 DIAGNOSIS — R269 Unspecified abnormalities of gait and mobility: Secondary | ICD-10-CM

## 2019-02-28 MED ORDER — CLOPIDOGREL BISULFATE 75 MG PO TABS: 75.0000 mg | ORAL_TABLET | Freq: Every day | ORAL | 1 refills | Status: DC

## 2019-02-28 NOTE — Telephone Encounter (Signed)
Placed [facility] in [facility] lock box for routine lab pick up. Results pending. 

## 2019-02-28 NOTE — Progress Notes (Addendum)
[facility]  PATIENT: Rachel Gilbert DOB: 1960-10-10  REFERRING DOCTOR OR PCP:  ER  No PCP SOURCE: ER notes, images on PACS, patient  _________________________________   HISTORICAL  CHIEF COMPLAINT:  Chief Complaint  Patient presents with  . Follow-up    RM 12, alone.  Last seen 08/26/2018. She has appt with urologist on 03/24/19 with [facility]).  . Multiple Sclerosis    On tysabri. Needs labs. Last JCV 02/24/18 indeterminate, inhibition assay: negative. Last infusion 02/25/2019. She now has a port.     HISTORY OF PRESENT ILLNESS:  Rachel Gilbert is 59 y.o. woman with relapsing remitting MS and OSA.    Update 02/28/2019: She receives Tysabri 300 mg every 4 weeks as her disease modifying therapy for MS.  She tolerated well she feels that the MS is mostly stable with no recent exacerbation.  She has had some issues with the port being painful to access, despite using EMLA cream.   It does not bother her when she is not getting the infusion.   This is her third port and the others did not hurt.   She has had trouble with regular IV's  She feels she is neurologically stable.   Her gait is ok with some stumbles.   She recently hit her calf and bruised it.   Legs are symmetric and she noted her muscles fatigue easily   She feel gabapentin 600 mg po qHS has helped the tingling dysesthesia.  She has urinary frequency but also has hesitancy and she sees urology.  She was placed on an antibiotic recently.     She has fatigue many days.    She sleeps well most nights.She notes some depression/anxiety despite Lexapro.     She will have  Pilonidal cyst procedure (scalp) next week.   She will call to see if she needs to go off Plavix.    She has OSA and uses CPAP nightly.   AHI = 2.4   Compliance was 100%.   Update 08/26/2018: She is on Tysabri for her MS and feels her MS is mostly stable.      She is noting more pain in both legs with a throbbing  characteristic.   Moving reduces the pain.  She eventually falls asleep (after an hour) and then stays asleep.    She has urinary urgency but no incontinence.     She has nocturia every 60-90 minutes at night but falls back asleep easily.     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   However, her mouth is dry when she wakes up which is annoying to her.    She has lost 56 pounds since surgery.    We discussed a repeat study in the future when weight is stable.    Update 02/24/2018: She is on Tysabri and she tolerates it well.   Her last infusion was 02/05/18.    She denies any exacerbation.   Her gait is baseline.  No falls.  No new numbness or weakness.   She has urinary frequency and nocturia but no changes.   Vision is fine.   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    She is sleeping well.  She uses CPAP.  Mood is doing well.  She had a duodenal switch gastric bypass bariatric procedure and she is feeling well.   She has lost 21 pounds since the surgery.   She has had constipation since surgery and is on stool softeners.   She had a stroke March 2019.   She has some expressive aphasia that has improved.    This is often frustrating.  She is on Plavix plus aspirin.   No new TIA or stroke symptoms.    Update 08/06/2017: She feels her MS has been stable.  She is on Tysabri 300 mg every 4 weeks.  She tolerates it well.  Her JCV antibody is negative.  (0.22)  She has not had any more strokelike symptoms.  She was hospitalized at [facility] in late March and had aphasia due to a left MCA distribution stroke.  She still has mild aphasia.  Because she was on Tysabri, she also had a lumbar puncture to rule out PML.  CT angiogram showed occlusion of 1 of the MCA branches.  TEE did not show a PFO or other source of emboli.   Carotid Dopplers did not show significant stenosis.  She was discharged on aspirin Plavix and continues on these.  She is doing about the same with her gait.   She stumbles but no recent falls.   Hr arm and leg strength are bout the same.     She is considering bariatric surgery.  She has OSA and uses CPAP nightly.    She still has some insomnia and rarely takes xanax if still awake after a while.    Update 05/07/2017: She was hospitalized at [facility] between 04/12/2017 and 04/21/2017 due to aphasia due to a left MCA distribution stroke. She also had some  There was a somewhat stuttering course and she first presented a few days earlier to an outside hospital and was  Treated with 3 days of IV Solu-Medrol with the thought that she might be having an exacerbation.  When symptoms worsen on 04/12/2017 she presented to [facility].  I have reviewed imaging studies and laboratory results from that hospitalization.  Because she is on Tysabri, there was initially some concern that the stroke could represent PML.  However, she underwent a lumbar puncture in the CSF did not show any evidence of JCV by PCR.  Additionally, CT angiogram appears to show occlusion of 1 of the MCA branches.     She has been placed on aspirin and then Plavix was added.  She had a TEE but no evidence of a PFO or other source of emboli.  Doppler study showed less than 50% stenosis.  A loop recorder is being considered.    Risk factors:   HTN (on 3 med's), OSA, no DM, no smoking, lipids ok (though Lipitor was added).   .   For her MS, she continues on Tysabri.  Last infusion was 04/29/2017.    She tolerates it well and has not had any definite exacerbations since starting.    She has some gait issues but this is the same as before the stroke.    She has more anxiety, helped by escitalopram.  Update 11/06/2016:    She feels her MS is doing well. She is on Tysabri. She tolerates it well and has not had any definite  exacerbations. Her last JCV antibody (05/07/2016) was negative at 0.15.  She denies any new symptoms.   Her gait is the same but she notes more left calf cramps.  Gait is wore on an uneven surface and climbing a few steps can be difficult.   She did have one fall when she bent down to pick up an ice cube that fell.   She did not hurt her head.     Her arms feels strong but the legs feels heavy (R=L) .  She denies numbness.    She notes frequency and urgency and rare urge incontinence.    Vision is doing well.   Fatigue and sleepiness persist to some extent, even with CPAP.  She feels a second wind in the late mornings .  She stopped phentermine because it did not help weight loss and did not help focus too much.  Many years ago she was o Ritalin with benefit for focus and attention, fatigue and weight loss..    She has severe OSA and uses CPAP nightly (+8 cm)                                                                                                                                                                                       _________________________________________ From 05/07/2016:  MS:   She has been on Tysabri since 05/2014.   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   We discussed the risks and benefits of Tysabri (about 1:2000 risk of PML assuming she fluctuates from low positive or negative)   OSA:   She has severe OSA. She is currently on CPAP +8 cm. She tolerates it well. I looked at the download from the last 53 days. It showed 100% compliance and a very good AHI equals 3.7. However, there was more than expected leak with 21.3 L/m.   The sleep study 2009 showed an AHI equals 38 with oxygen desaturations to 76%.   She notes that her mask sometimes leaks. She does not think that she snores through the mask.   She uses a FF mask and notes breathing through her mouth  Gait/strength/sensation/coordination:   Gait is slightly better.   Her  new home does not have stairs and she does not tire out as much getting about.   Ampyra helped but the co-pay was very high at $400 and she cannot afford that. She does not think that she could afford the compounded drug (which is usually about $60 a month).    Balance is poor affecting gait.    She often uses a cane for balance.    Her legs are both mildly weak.   She has most trouble with changes in surface or with curbs.   . She denies weakness in  the arms. She denies any numbness in the arms or legs. She has mild leg clumsiness. Ampyra has helped her gait and she has been on x 2 years.       Bladder/bowel: She has urinary urgency, frequency and nocturia (twice a night). Ditropan XL 10 mg has helped but she just takes now and then since starting the new CPAP machine.   At most she has one x nocturia.     Vision: She feels her vision is fine.  Fatigue/sleep: She has physical and mental fatigue.  Fatigue is better on Tysabri than previous treatments.  She feels less fstigue and sleepiness with her CPAP machine.   Ampyra helps her gait endurance as she is able to walk a little further with it.  Phentermine helps as much as Adderall but makes her feel hot at times.    She has insomnia, both sleep onset and sleep maintenance.   Mood/Cognition:   She still has some crying spells and sometimes inappropriate crying.    She could not afford Nuedexta.    Her boyfriend died in a motorcycle accident last year.    She has done grief counseling.   She feels her cognitive fog is reduced since being on Tysabri and with stimulants.       MS history: She was diagnosed in September 2012 after presenting with diplopia. She was hospitalized and received 5 days of IV steroids and she had MRI and lumbar puncture. She recalls that the lumbar puncture was not consistent with MS but the MRI was.  She was placed on Gilenya in December 2012. She was able to return to work and the diplopia completely resolved. Then in July 2014  she had another exacerbation with visual disturbance bilaterally, gait changes and cognitive changes.  Reportedly, there was a new lesion in the brainstem and also in the spinal cord at that time. She received 5 more days of steroid.    In early May 2016, she had the onset of several symptoms including worse cognitive problems, worse gait problems and weakness in the hands.   She went to the [place] [facility] emergency [facility] on 06/03/2014 and she was evaluated. An admitted and received 3 days of IV Solu-Medrol.The MRI of the brain shows many white matter lesions. Some are periventricular with an appearance consistent with MS. She also has deep white matter subcortical and juxtacortical foci. Some of these look more consistent with small vessel ischemic changes and most more consistent with multiple sclerosis. She does have new foci in the left vermis of the cerebellum and in the left frontal periventricular region.[name]    REVIEW OF SYSTEMS: Constitutional: No fevers, chills, sweats, or change in appetite.   She has fatigue Eyes: No visual changes, double vision, eye pain Ear, nose and throat: No hearing loss, ear pain, nasal congestion, sore throat Cardiovascular: No chest pain, palpitations Respiratory: No shortness of breath at rest or with exertion.   No wheezes GastrointestinaI: No nausea, vomiting, diarrhea, abdominal pain, fecal incontinence Genitourinary: No dysuria, urinary retention.   Frequency better on medications. Musculoskeletal: No neck pain, back pain Integumentary: No rash, pruritus, skin lesions Neurological: as above Psychiatric: No depression at this time.  No anxiety Endocrine: No palpitations, diaphoresis, change in appetite, change in weigh or increased thirst Hematologic/Lymphatic: No anemia, purpura, petechiae. Allergic/Immunologic: No itchy/runny eyes, nasal congestion, recent allergic reactions, rashes  ALLERGIES: Allergies  Allergen Reactions  . Darvon [Propoxyphene]  Anaphylaxis and Other (See Comments)    Throat swelling  .  Oxycodone Anaphylaxis  . Percocet [Oxycodone-Acetaminophen] Anaphylaxis    HOME MEDICATIONS:  Current Outpatient Medications:  .  ALPRAZolam (XANAX) 0.5 MG tablet, Take 0.5 mg by mouth 2 (two) times daily as needed for anxiety., Disp: , Rfl:  .  aspirin EC 81 MG EC tablet, Take 1 tablet (81 mg total) by mouth daily., Disp: 30 tablet, Rfl: 0 .  atorvastatin (LIPITOR) 80 MG tablet, Take 1 tablet (80 mg total) by mouth daily at 6 PM., Disp: 30 tablet, Rfl: 0 .  Calcium-Vitamin D (CALTRATE 600 PLUS-VIT D PO), Take 4 tablets by mouth daily., Disp: , Rfl:  .  carvedilol (COREG) 25 MG tablet, Take 25 mg by mouth 2 (two) times daily with a meal., Disp: , Rfl:  .  clopidogrel (PLAVIX) 75 MG tablet, Take 1 tablet (75 mg total) by mouth daily., Disp: 90 tablet, Rfl: 1 .  escitalopram (LEXAPRO) 20 MG tablet, Take 20 mg by mouth daily., Disp: , Rfl:  .  gabapentin (NEURONTIN) 300 MG capsule, Take one or two at bedtime (Patient taking differently: Take 600 mg by mouth at bedtime. ), Disp: 60 capsule, Rfl: 11 .  hydrALAZINE (APRESOLINE) 50 MG tablet, Take 50 mg by mouth 2 (two) times daily., Disp: , Rfl:  .  lidocaine-prilocaine (EMLA) cream, Apply 1 application topically as needed., Disp: 30 g, Rfl: 1 .  Multiple Vitamins-Minerals (HAIR SKIN & NAILS ADVANCED PO), Take 1 tablet by mouth daily. , Disp: , Rfl:  .  natalizumab (TYSABRI) 300 MG/15ML injection, Inject 15 mLs into the vein every 28 (twenty-eight) days. , Disp: , Rfl:  .  ondansetron (ZOFRAN-ODT) 4 MG disintegrating tablet, Take 4 mg by mouth every 8 (eight) hours as needed for nausea or vomiting., Disp: , Rfl:  .  oxybutynin (DITROPAN) 5 MG tablet, Take 1 tablet (5 mg total) by mouth 2 (two) times daily. (Patient taking differently: Take 10 mg by mouth at bedtime. ), Disp: 60 tablet, Rfl: 11 .  pantoprazole (PROTONIX) 20 MG tablet, Take 20 mg by mouth daily., Disp: , Rfl:  .  potassium  chloride (MICRO-K) 10 MEQ CR capsule, Take 10 mEq by mouth daily. , Disp: , Rfl:  .  UNABLE TO FIND, Take 2 tablets by mouth daily. Med Name: Bariatric chewable Advanced Multi EA, Disp: , Rfl:   PAST MEDICAL HISTORY: Past Medical History:  Diagnosis Date  . Complication of anesthesia    hard time waking up   . Hearing loss   . Hypertension   . Kidney stones   . Multiple sclerosis (HCC)   . Sleep apnea    Uses CPAP  . Stroke (HCC) 03/2017  . Vision abnormalities     PAST SURGICAL HISTORY: Past Surgical History:  Procedure Laterality Date  . CESAREAN SECTION  1986  . CHOLECYSTECTOMY  2000   laparoscopic  . IR FLUORO GUIDED NEEDLE PLC ASPIRATION/INJECTION LOC  04/15/2017  . IR IMAGING GUIDED PORT INSERTION  10/20/2018  . LAPAROSCOPIC GASTRIC RESTRICTIVE DUODENAL PROCEDURE (DUODENAL SWITCH)    . RADIOLOGY WITH ANESTHESIA N/A 04/15/2017   Procedure: LUMBER PUNCTURE MRI SCAN;  Surgeon: Radiologist, Medication, MD;  Location: [facility] OR;  Service: Radiology;  Laterality: N/A;  . TEE WITHOUT CARDIOVERSION N/A 04/16/2017   Procedure: TRANSESOPHAGEAL ECHOCARDIOGRAM (TEE) WITH LOOP;  Surgeon: Wendall Stade, MD;  Location: [facility] ENDOSCOPY;  Service: Cardiovascular;  Laterality: N/A;    FAMILY HISTORY: Family History  Problem Relation Age of Onset  . Fibromyalgia Mother   . Stroke Father   .  Diabetes Father   . Stroke Other     SOCIAL HISTORY:  Social History   Socioeconomic History  . Marital status: Single    Spouse name: Not on file  . Number of children: Not on file  . Years of education: Not on file  . Highest education level: Not on file  Occupational History  . Occupation: disabled  Tobacco Use  . Smoking status: Never Smoker  . Smokeless tobacco: Never Used  Substance and Sexual Activity  . Alcohol use: Yes    Comment: Occasional  . Drug use: No  . Sexual activity: Not on file  Other Topics Concern  . Not on file  Social History Narrative  . Not on file   Social  Determinants of Health   Financial Resource Strain:   . Difficulty of Paying Living Expenses: Not on file  Food Insecurity:   . Worried About Programme researcher, broadcasting/film/video in the Last Year: Not on file  . Ran Out of Food in the Last Year: Not on file  Transportation Needs:   . Lack of Transportation (Medical): Not on file  . Lack of Transportation (Non-Medical): Not on file  Physical Activity:   . Days of Exercise per Week: Not on file  . Minutes of Exercise per Session: Not on file  Stress:   . Feeling of Stress : Not on file  Social Connections:   . Frequency of Communication with Friends and Family: Not on file  . Frequency of Social Gatherings with Friends and Family: Not on file  . Attends Religious Services: Not on file  . Active Member of Clubs or Organizations: Not on file  . Attends Banker Meetings: Not on file  . Marital Status: Not on file  Intimate Partner Violence:   . Fear of Current or Ex-Partner: Not on file  . Emotionally Abused: Not on file  . Physically Abused: Not on file  . Sexually Abused: Not on file     PHYSICAL EXAM  Vitals:   02/28/19 1300  BP: (!) 145/88  Pulse: 64  Temp: (!) 97.5 F (36.4 C)  Weight: 204 lb 8 oz (92.8 kg)  Height: 5\' 8"  (1.727 m)    Body mass index is 31.09 kg/m.   General: The patient is well-developed and well-nourished and in no acute distress  Neurologic Exam  Mental status: The patient is alert and oriented x 3 at the time of the examination.   Good affect today.  She is alert and oriented.     Focus and attention are good today.  No obvious aphasia today.  Cranial nerves: Extraocular movements are full.  PERRLA.  Color vision is symmetric.  Facial strength and sensation is normal.  Trapezius strength is normal.  Minimal dysarthria.  The tongue is midline, and the patient has  elevation of the soft palate.   Motor:  Muscle bulk is normal.  Muscle tone is normal in the arms are mildly increased in the left leg.   Strength is 5/5.except 4+/5 left toe extension.   Sensory: Sensory testing shows intact touch sensation in the arms and legs  Coordination: Finger-nose-finger is performed well.  Heel-to-shin is reduced on the left.  Gait and station: Station is normal.  The gait is mildly wide.  Tandem gait is poor..    Romberg is negative.   Reflexes: Deep tendon reflexes are symmetric and slightly increased in legs.       DIAGNOSTIC DATA (LABS, IMAGING, TESTING) - I  reviewed patient records, labs, notes, testing and imaging myself where available.  Lab Results  Component Value Date   WBC 7.6 02/11/2019   HGB 12.5 02/11/2019   HCT 40.2 02/11/2019   MCV 92.4 02/11/2019   PLT 216 02/11/2019      Component Value Date/Time   NA 143 02/11/2019 0516   NA 143 02/24/2018 1332   K 4.4 02/11/2019 0516   CL 109 02/11/2019 0516   CO2 25 02/11/2019 0516   GLUCOSE 100 (H) 02/11/2019 0516   BUN 17 02/11/2019 0516   BUN 19 02/24/2018 1332   CREATININE 1.25 (H) 02/11/2019 0516   CALCIUM 9.0 02/11/2019 0516   PROT 6.3 (L) 02/11/2019 0516   PROT 5.9 (L) 02/24/2018 1332   ALBUMIN 4.0 02/11/2019 0516   ALBUMIN 4.3 02/24/2018 1332   AST 30 02/11/2019 0516   ALT 57 (H) 02/11/2019 0516   ALKPHOS 139 (H) 02/11/2019 0516   BILITOT 0.9 02/11/2019 0516   BILITOT 0.9 02/24/2018 1332   GFRNONAA 48 (L) 02/11/2019 0516   GFRAA 55 (L) 02/11/2019 0516      ASSESSMENT AND PLAN    1. Multiple sclerosis (HCC)   2. High risk medication use   3. Restless leg syndrome   4. Depression with anxiety   5. Gait disturbance   6. Obstructive sleep apnea on CPAP   7. Cerebrovascular accident (CVA) due to thrombosis of precerebral artery (HCC)   8. Poor venous access      1.   She will continue Tysabri.   Check  JCV Ab and CBC.   If she continues to have pain at the port site for access consider having her seen vascular.  In the interim I have asked her to put the EMLA cream on a few hours before the visit and  again right before she leaves her house  2.   Continue plavix plus aspirin.   She is advised to ask the surgeon doing the pilonidal cyst removal whether or not she would need to go off of Plavix 3.   Stay active and exercise as tolerated.     4.   She is doing well with CPAP and will continue the current settings. 5.   She will return in 6 months for f/u with me or call sooner if problems    Allyn Bertoni A. Epimenio Foot, MD, PhD, FAAN Certified in Neurology, Clinical Neurophysiology, Sleep Medicine, Pain Medicine and Neuroimaging Director, [facility] at [facility]  [facility] [address], [address] [place], [place] [address] [phone]

## 2019-03-01 LAB — CBC WITH DIFFERENTIAL/PLATELET
Basophils Absolute: 0 10*3/uL (ref 0.0–0.2)
Basos: 1 %
EOS (ABSOLUTE): 0.2 10*3/uL (ref 0.0–0.4)
Eos: 3 %
Hematocrit: 39 % (ref 34.0–46.6)
Hemoglobin: 12.4 g/dL (ref 11.1–15.9)
Immature Grans (Abs): 0 10*3/uL (ref 0.0–0.1)
Immature Granulocytes: 1 %
Lymphocytes Absolute: 2.4 10*3/uL (ref 0.7–3.1)
Lymphs: 29 %
MCH: 29.3 pg (ref 26.6–33.0)
MCHC: 31.8 g/dL (ref 31.5–35.7)
MCV: 92 fL (ref 79–97)
Monocytes Absolute: 0.6 10*3/uL (ref 0.1–0.9)
Monocytes: 7 %
Neutrophils Absolute: 4.9 10*3/uL (ref 1.4–7.0)
Neutrophils: 59 %
Platelets: 224 10*3/uL (ref 150–450)
RBC: 4.23 x10E6/uL (ref 3.77–5.28)
RDW: 13.7 % (ref 11.7–15.4)
WBC: 8.2 10*3/uL (ref 3.4–10.8)

## 2019-03-07 NOTE — Telephone Encounter (Signed)
JCV ab drawn on 02/28/19 negative, index: 0.19

## 2019-03-24 ENCOUNTER — Ambulatory Visit: Payer: Medicare HMO | Admitting: Urology

## 2019-03-24 ENCOUNTER — Encounter: Payer: Self-pay | Admitting: Urology

## 2019-03-24 ENCOUNTER — Other Ambulatory Visit: Payer: Self-pay

## 2019-03-24 VITALS — BP 138/81 | HR 66 | Ht 68.0 in | Wt 202.7 lb

## 2019-03-24 DIAGNOSIS — N390 Urinary tract infection, site not specified: Secondary | ICD-10-CM

## 2019-03-24 DIAGNOSIS — N3281 Overactive bladder: Secondary | ICD-10-CM

## 2019-03-24 LAB — URINALYSIS, COMPLETE
Bilirubin, UA: NEGATIVE
Glucose, UA: NEGATIVE
Ketones, UA: NEGATIVE
Leukocytes,UA: NEGATIVE
Nitrite, UA: NEGATIVE
Protein,UA: NEGATIVE
RBC, UA: NEGATIVE
Specific Gravity, UA: >1.03 — ABNORMAL HIGH (ref 1.005–1.030)
Urobilinogen, Ur: 1 mg/dL (ref 0.2–1.0)
pH, UA: 5 (ref 5.0–7.5)

## 2019-03-24 LAB — MICROSCOPIC EXAMINATION
Bacteria, UA: NONE SEEN
RBC, Urine: NONE SEEN /hpf (ref 0–2)

## 2019-03-24 LAB — BLADDER SCAN AMB NON-IMAGING

## 2019-03-24 MED ORDER — OXYBUTYNIN CHLORIDE ER 10 MG PO TB24: 10.0000 mg | ORAL_TABLET | Freq: Every day | ORAL | 11 refills | Status: DC

## 2019-03-24 NOTE — Progress Notes (Signed)
03/24/19 9:50 AM   Rachel Gilbert 06-Aug-1960 [id number]  CC: Overactive bladder  HPI: I saw Rachel Gilbert in urology clinic for primary complaint of urinary frequency, urgency, and urge incontinence.  She is a very comorbid 59 year old female with past medical history notable for anxiety, multiple sclerosis, stroke, morbid obesity status post gastric bypass, and nephrolithiasis.  She denies any history of recurrent UTIs.  She has a long history of urinary urgency and frequency with occasional urge incontinence, and she feels like she urinates every 30 minutes to an hour during the day.  She was prescribed oxybutynin 5 mg twice daily by her neurologist, however she is just taking 1 dose right before bed.  She has no nocturia or urinary complaints overnight.  She drinks tea and juice during the day.  She denies any gross hematuria.  She was recently seen in the ED on 02/11/2019 with dysuria and flank pain and a CT was performed to evaluate for stones.  CT showed no hydronephrosis or nephrolithiasis, and was essentially benign.  Her urinalysis showed 11-20 RBCs and 11-20 WBCs, however no culture was sent.  She was treated with antibiotics and her pain resolved.  There are no prior positive urine cultures.  She denies any smoking history or other carcinogenic exposures.  Urinalysis today is completely benign with 0 RBCs, 0 WBCs, no bacteria, nitrite negative.  PVR is normal at 10 mL.   PMH: Past Medical History:  Diagnosis Date  . Complication of anesthesia    hard time waking up   . Hearing loss   . Hypertension   . Kidney stones   . Multiple sclerosis ([place])   . Sleep apnea    Uses CPAP  . Stroke ([place]) 03/2017  . Vision abnormalities     Surgical History: Past Surgical History:  Procedure Laterality Date  . CESAREAN SECTION  1986  . CHOLECYSTECTOMY  2000   laparoscopic  . IR FLUORO GUIDED NEEDLE PLC ASPIRATION/INJECTION LOC  04/15/2017  . IR IMAGING GUIDED PORT INSERTION  10/20/2018  .  LAPAROSCOPIC GASTRIC RESTRICTIVE DUODENAL PROCEDURE (DUODENAL SWITCH)    . RADIOLOGY WITH ANESTHESIA N/A 04/15/2017   Procedure: LUMBER PUNCTURE MRI SCAN;  Surgeon: Radiologist, Medication, MD;  Location: [place];  Service: Radiology;  Laterality: N/A;  . TEE WITHOUT CARDIOVERSION N/A 04/16/2017   Procedure: TRANSESOPHAGEAL ECHOCARDIOGRAM (TEE) WITH LOOP;  Surgeon: Josue Hector, MD;  Location[facility] END[facility]COPY;  Service: Cardiovascular;  Laterality: N/A;    Family History: Family History  Problem Relation Age of Onset  . Fibromyalgia Mother   . Stroke Father   . Diabetes Father   . Stroke Other     Social History:  reports that she has never smoked. She has never used smokeless tobacco. She reports current alcohol use. She reports that she does not use drugs.  Physical Exam: LMP 01/15/2015    Constitutional:  Alert and oriented, No acute distress. Cardiovascular: No clubbing, cyanosis, or edema. Respiratory: Normal respiratory effort, no increased work of breathing. GI: Abdomen is soft, nontender, nondistended, no abdominal masses Psychiatric: Normal mood and affect.  Laboratory Data: Reviewed, see HPI  Pertinent Imaging: I have personally reviewed the CT stone protocol, see HPI.  No hydronephrosis or nephrolithiasis.  Assessment & Plan:   In summary, she is a comorbid 59 year old female with multiple sclerosis and overactive bladder symptoms of urinary frequency, urgency, and urge incontinence.  She had one episode of microscopic hematuria 6 weeks ago during a suspected acute UTI episode with  dysuria and flank pain that improved with antibiotics, however culture was not sent.  Her urinalysis is completely benign today, and she does not meet the definition for hematuria work-up with CT urogram or cystoscopy.  We discussed at length the relationship between MS and OAB symptoms and treatment strategies.    We discussed that overactive bladder (OAB) is not a disease, but is a symptom  complex that is generally not life-threatening.  Symptoms typically include urinary urgency, frequency, and urge incontinence.  There are numerous treatment options, however there are risks and benefits with both medical and surgical management.  First-line treatment is behavioral therapies including bladder training, pelvic floor muscle training, and fluid management.  Second line treatments include oral antimuscarinics(Ditropan er, Trospium) and beta-3 agonist (Mybetriq). There is typically a period of medication trial (4-8 weeks) to find the optimal therapy and dosing. If symptoms are bothersome despite the above management, third line options include intra-detrusor botox, peripheral tibial nerve stimulation (PTNS), and interstim (SNS). These are more invasive treatments with higher side effect profile, but may improve quality of life for patients with severe OAB symptoms.   Behavioral strategies discussed Trial of oxybutynin 10 mg XL daily, good Rx coupon provided RTC with PA 6 weeks for symptom check Consider PTNS in the future if refractory symptoms  I spent 45 total minutes on the day of the encounter including pre-visit review of the medical record, face-to-face time with the patient, and post visit ordering of labs/imaging/tests.  Legrand Rams, MD 03/24/2019  [facility] [address] [place], [place] [address] [phone]

## 2019-03-24 NOTE — Patient Instructions (Signed)

## 2019-03-25 ENCOUNTER — Ambulatory Visit (HOSPITAL_COMMUNITY)
Admission: RE | Admit: 2019-03-25 | Discharge: 2019-03-25 | Disposition: A | Discharge status: Home / Self Care | Payer: Medicare HMO | Source: Ambulatory Visit | Attending: Neurology | Admitting: Neurology

## 2019-03-25 DIAGNOSIS — G35 Multiple sclerosis: Secondary | ICD-10-CM | POA: Diagnosis present

## 2019-03-25 MED ORDER — HEPARIN SOD (PORK) LOCK FLUSH 100 UNIT/ML IV SOLN
500.0000 [IU] | INTRAVENOUS | Status: AC | PRN
  Administered 2019-03-25: 500 [IU]
  Filled 2019-03-25: qty 5

## 2019-03-25 MED ORDER — SODIUM CHLORIDE 0.9% FLUSH
10.0000 mL | INTRAVENOUS | Status: AC | PRN
  Administered 2019-03-25: 12:00:00 10 mL

## 2019-03-25 MED ORDER — SODIUM CHLORIDE 0.9 % IV SOLN
INTRAVENOUS | Status: DC | PRN
  Administered 2019-03-25: 10:00:00 250 mL via INTRAVENOUS

## 2019-03-25 MED ORDER — SODIUM CHLORIDE 0.9 % IV SOLN
300.0000 mg | INTRAVENOUS | Status: DC
  Administered 2019-03-25: 300 mg via INTRAVENOUS
  Filled 2019-03-25: qty 15

## 2019-03-25 NOTE — Discharge Instructions (Signed)
Natalizumab injection What is this medicine? NATALIZUMAB (na ta LIZ you mab) is used to treat relapsing multiple sclerosis. This drug is not a cure. It is also used to treat Crohn's disease. This medicine may be used for other purposes; ask your health care provider or pharmacist if you have questions. COMMON BRAND NAME(S): Tysabri What should I tell my health care provider before I take this medicine? They need to know if you have any of these conditions:  immune system problems  progressive multifocal leukoencephalopathy (PML)  an unusual or allergic reaction to natalizumab, other medicines, foods, dyes, or preservatives  pregnant or trying to get pregnant  breast-feeding How should I use this medicine? This medicine is for infusion into a vein. It is given by a health care professional in a hospital or clinic setting. A special MedGuide will be given to you by the pharmacist with each prescription and refill. Be sure to read this information carefully each time. Talk to your pediatrician regarding the use of this medicine in children. This medicine is not approved for use in children. Overdosage: If you think you have taken too much of this medicine contact a poison control center or emergency room at once. NOTE: This medicine is only for you. Do not share this medicine with others. What if I miss a dose? It is important not to miss your dose. Call your doctor or health care professional if you are unable to keep an appointment. What may interact with this medicine? Do not take this medicine with any of the following medications:  biologic medicines such as adalimumab, certolizumab, etanercept, golimumab, infliximab This medicine may also interact with the following medications:  azathioprine  cyclosporine  interferons  6-mercaptopurine  methotrexate  other medicines that lower your chance of fighting an infection  steroid medicines like prednisone or  cortisone  vaccines This list may not describe all possible interactions. Give your health care provider a list of all the medicines, herbs, non-prescription drugs, or dietary supplements you use. Also tell them if you smoke, drink alcohol, or use illegal drugs. Some items may interact with your medicine. What should I watch for while using this medicine? Your condition will be monitored carefully while you are receiving this medicine. Visit your doctor for regular check ups. Tell your doctor or healthcare professional if your symptoms do not start to get better or if they get worse. Stay away from people who are sick. Call your doctor or health care professional for advice if you get a fever, chills or sore throat, or other symptoms of a cold or flu. Do not treat yourself. In some patients, this medicine may cause a serious brain infection that may cause death. If you have any problems seeing, thinking, speaking, walking, or standing, tell your doctor right away. If you cannot reach your doctor, get urgent medical care. What side effects may I notice from receiving this medicine? Side effects that you should report to your doctor or health care professional as soon as possible:  allergic reactions like skin rash, itching or hives, swelling of the face, lips, or tongue  breathing problems  changes in vision  chest pain  confusion  depressed mood  dizziness  feeling faint; lightheaded; falls  general ill feeling or flu-like symptoms  loss of memory  missed menstrual periods  muscle weakness  problems with balance, talking, or walking  signs and symptoms of liver injury like dark yellow or brown urine; general ill feeling or flu-like symptoms; light-colored   stools; loss of appetite; nausea; right upper belly pain; unusually weak or tired; yellowing of the eyes or skin  suicidal thoughts, mood changes  unusual bruising or bleeding  unusually weak or tired Side effects that  usually do not require medical attention (report to your doctor or health care professional if they continue or are bothersome):  headache  joint pain  muscle cramps  muscle pain  nausea, vomiting  pain, redness, or irritation at site where injected  tiredness This list may not describe all possible side effects. Call your doctor for medical advice about side effects. You may report side effects to FDA at 1-800-FDA-1088. Where should I keep my medicine? This drug is given in a hospital or clinic and will not be stored at home. NOTE: This sheet is a summary. It may not cover all possible information. If you have questions about this medicine, talk to your doctor, pharmacist, or health care provider.  2020 Elsevier/Gold Standard (2018-07-19 13:20:26)  

## 2019-03-25 NOTE — Progress Notes (Signed)
PATIENT CARE CENTER NOTE  Diagnosis:Multiple Sclerosis   Provider:Sater, Richard MD   Procedure:Tysabri infusion   Note:Patient received Tysabri infusion via right chest PAC. Tolerated well with no adverse reaction. Vital signs remained stable. Patientdid not wantto remain for the 1 hour post-infusion. Discharge instructions given. Patient alert, oriented and ambulatory at discharge.

## 2019-04-22 ENCOUNTER — Ambulatory Visit (HOSPITAL_COMMUNITY)
Admission: RE | Admit: 2019-04-22 | Discharge: 2019-04-22 | Disposition: A | Discharge status: Home / Self Care | Payer: Medicare HMO | Source: Ambulatory Visit | Attending: Neurology | Admitting: Neurology

## 2019-04-22 ENCOUNTER — Telehealth: Payer: Self-pay | Admitting: Neurology

## 2019-04-22 ENCOUNTER — Other Ambulatory Visit: Payer: Self-pay

## 2019-04-22 DIAGNOSIS — G35 Multiple sclerosis: Secondary | ICD-10-CM | POA: Diagnosis not present

## 2019-04-22 MED ORDER — SODIUM CHLORIDE 0.9% FLUSH
10.0000 mL | INTRAVENOUS | Status: AC | PRN
  Administered 2019-04-22: 12:00:00 10 mL

## 2019-04-22 MED ORDER — HEPARIN SOD (PORK) LOCK FLUSH 100 UNIT/ML IV SOLN
500.0000 [IU] | INTRAVENOUS | Status: AC | PRN
  Administered 2019-04-22: 12:00:00 500 [IU]
  Filled 2019-04-22: qty 5

## 2019-04-22 MED ORDER — SODIUM CHLORIDE 0.9 % IV SOLN
INTRAVENOUS | Status: DC | PRN
  Administered 2019-04-22: 11:00:00 250 mL via INTRAVENOUS

## 2019-04-22 MED ORDER — SODIUM CHLORIDE 0.9 % IV SOLN
300.0000 mg | INTRAVENOUS | Status: DC
  Administered 2019-04-22: 11:00:00 300 mg via INTRAVENOUS
  Filled 2019-04-22: qty 15

## 2019-04-22 NOTE — Progress Notes (Signed)
Patient received 300 mg of Tysabri as ordered by Despina Arias MD via an implanted port that was accessed and de-accessed per protocol. Patient declined the 60 minutes post infusion observation.Tolerated well, vitals stable, discharge instructions given, verbalized understanding. Patient alert, oriented and ambulatory at the time of discharge.

## 2019-04-22 NOTE — Discharge Instructions (Signed)
Natalizumab injection What is this medicine? NATALIZUMAB (na ta LIZ you mab) is used to treat relapsing multiple sclerosis. This drug is not a cure. It is also used to treat Crohn's disease. This medicine may be used for other purposes; ask your health care provider or pharmacist if you have questions. COMMON BRAND NAME(S): Tysabri What should I tell my health care provider before I take this medicine? They need to know if you have any of these conditions:  immune system problems  progressive multifocal leukoencephalopathy (PML)  an unusual or allergic reaction to natalizumab, other medicines, foods, dyes, or preservatives  pregnant or trying to get pregnant  breast-feeding How should I use this medicine? This medicine is for infusion into a vein. It is given by a health care professional in a hospital or clinic setting. A special MedGuide will be given to you by the pharmacist with each prescription and refill. Be sure to read this information carefully each time. Talk to your pediatrician regarding the use of this medicine in children. This medicine is not approved for use in children. Overdosage: If you think you have taken too much of this medicine contact a poison control center or emergency room at once. NOTE: This medicine is only for you. Do not share this medicine with others. What if I miss a dose? It is important not to miss your dose. Call your doctor or health care professional if you are unable to keep an appointment. What may interact with this medicine? Do not take this medicine with any of the following medications:  biologic medicines such as adalimumab, certolizumab, etanercept, golimumab, infliximab This medicine may also interact with the following medications:  azathioprine  cyclosporine  interferons  6-mercaptopurine  methotrexate  other medicines that lower your chance of fighting an infection  steroid medicines like prednisone or  cortisone  vaccines This list may not describe all possible interactions. Give your health care provider a list of all the medicines, herbs, non-prescription drugs, or dietary supplements you use. Also tell them if you smoke, drink alcohol, or use illegal drugs. Some items may interact with your medicine. What should I watch for while using this medicine? Your condition will be monitored carefully while you are receiving this medicine. Visit your doctor for regular check ups. Tell your doctor or healthcare professional if your symptoms do not start to get better or if they get worse. Stay away from people who are sick. Call your doctor or health care professional for advice if you get a fever, chills or sore throat, or other symptoms of a cold or flu. Do not treat yourself. In some patients, this medicine may cause a serious brain infection that may cause death. If you have any problems seeing, thinking, speaking, walking, or standing, tell your doctor right away. If you cannot reach your doctor, get urgent medical care. What side effects may I notice from receiving this medicine? Side effects that you should report to your doctor or health care professional as soon as possible:  allergic reactions like skin rash, itching or hives, swelling of the face, lips, or tongue  breathing problems  changes in vision  chest pain  confusion  depressed mood  dizziness  feeling faint; lightheaded; falls  general ill feeling or flu-like symptoms  loss of memory  missed menstrual periods  muscle weakness  problems with balance, talking, or walking  signs and symptoms of liver injury like dark yellow or brown urine; general ill feeling or flu-like symptoms; light-colored   stools; loss of appetite; nausea; right upper belly pain; unusually weak or tired; yellowing of the eyes or skin  suicidal thoughts, mood changes  unusual bruising or bleeding  unusually weak or tired Side effects that  usually do not require medical attention (report to your doctor or health care professional if they continue or are bothersome):  headache  joint pain  muscle cramps  muscle pain  nausea, vomiting  pain, redness, or irritation at site where injected  tiredness This list may not describe all possible side effects. Call your doctor for medical advice about side effects. You may report side effects to FDA at 1-800-FDA-1088. Where should I keep my medicine? This drug is given in a hospital or clinic and will not be stored at home. NOTE: This sheet is a summary. It may not cover all possible information. If you have questions about this medicine, talk to your doctor, pharmacist, or health care provider.  2020 Elsevier/Gold Standard (2018-07-19 13:20:26)  

## 2019-04-22 NOTE — Telephone Encounter (Signed)
Ashley from the [facility] called stating she is needing an updated Tysabri order. The one they have now expired in January. Please fax to [phone]

## 2019-04-25 NOTE — Telephone Encounter (Signed)
Order filled out, waiting on MD signature

## 2019-04-25 NOTE — Telephone Encounter (Signed)
Faxed signed order to [facility] at [phone]. Received fax confirmation.

## 2019-05-05 ENCOUNTER — Ambulatory Visit: Payer: Medicare HMO | Admitting: Physician Assistant

## 2019-05-05 ENCOUNTER — Encounter (INDEPENDENT_AMBULATORY_CARE_PROVIDER_SITE_OTHER): Payer: Self-pay

## 2019-05-05 ENCOUNTER — Other Ambulatory Visit: Payer: Self-pay

## 2019-05-05 VITALS — BP 164/99 | HR 84 | Wt 205.0 lb

## 2019-05-05 DIAGNOSIS — N3281 Overactive bladder: Secondary | ICD-10-CM

## 2019-05-05 LAB — BLADDER SCAN AMB NON-IMAGING: Scan Result: 17

## 2019-05-05 NOTE — Progress Notes (Signed)
05/05/2019 11:12 AM   Rachel Gilbert [birth year] [id number]  CC: OAB follow-up on oxybutynin 10 mg XL  HPI: Rachel Gilbert is a 59 y.o. female with PMH MS, stroke, nephrolithiasis, and OAB with urgency, frequency, and occasional urge incontinence who presents today for symptom follow-up.  She saw Dr. Diamantina Providence in clinic on 03/24/2019 for evaluation of the above and was started on oxybutynin 10 mg XL daily.  Today, she reports significant symptom improvement on Ditropan.  She is only using 1 pad daily for occasional urge incontinence, previously 3 pads daily.  She notices marked improvement in her frequency and urgency as well.  Overall, she is pleased with her symptom improvement.  No acute concerns.  PVR 49mL.  PMH: Past Medical History:  Diagnosis Date  . Complication of anesthesia    hard time waking up   . Hearing loss   . Hypertension   . Kidney stones   . Multiple sclerosis ([place])   . Sleep apnea    Uses CPAP  . Stroke ([place]) 03/2017  . Vision abnormalities     Surgical History: Past Surgical History:  Procedure Laterality Date  . CESAREAN SECTION  1986  . CHOLECYSTECTOMY  2000   laparoscopic  . IR FLUORO GUIDED NEEDLE PLC ASPIRATION/INJECTION LOC  04/15/2017  . IR IMAGING GUIDED PORT INSERTION  10/20/2018  . LAPAROSCOPIC GASTRIC RESTRICTIVE DUODENAL PROCEDURE (DUODENAL SWITCH)    . RADIOLOGY WITH ANESTHESIA N/A 04/15/2017   Procedure: LUMBER PUNCTURE MRI SCAN;  Surgeon: Radiologist, Medication, MD;  Location: [place];  Service: Radiology;  Laterality: N/A;  . TEE WITHOUT CARDIOVERSION N/A 04/16/2017   Procedure: TRANSESOPHAGEAL ECHOCARDIOGRAM (TEE) WITH LOOP;  Surgeon: Josue Hector, MD;  Location: [facility] ENDOSCOPY;  Service: Cardiovascular;  Laterality: N/A;    Home Medications:  Allergies as of 05/05/2019      Reactions   Darvon [propoxyphene] Anaphylaxis, Other (See Comments)   Throat swelling   Oxycodone Anaphylaxis   Percocet [oxycodone-acetaminophen] Anaphylaxis      Medication List       Accurate as of May 05, 2019 11:12 AM. If you have any questions, ask your nurse or doctor.        ALPRAZolam 0.5 MG tablet Commonly known as: XANAX Take 0.5 mg by mouth 2 (two) times daily as needed for anxiety.   aspirin 81 MG EC tablet Take 1 tablet (81 mg total) by mouth daily.   atorvastatin 80 MG tablet Commonly known as: LIPITOR Take 1 tablet (80 mg total) by mouth daily at 6 PM.   CALTRATE 600 PLUS-VIT D PO Take 4 tablets by mouth daily.   carvedilol 25 MG tablet Commonly known as: COREG Take 25 mg by mouth 2 (two) times daily with a meal.   clopidogrel 75 MG tablet Commonly known as: PLAVIX Take 1 tablet (75 mg total) by mouth daily.   DSS 100 MG Caps Stool Softener 100 mg capsule  Take 1 capsule every day by oral route.   escitalopram 20 MG tablet Commonly known as: LEXAPRO Take 20 mg by mouth daily.   FLUoxetine 20 MG tablet Commonly known as: PROZAC fluoxetine 20 mg tablet  4 20 mg tabs in am   gabapentin 300 MG capsule Commonly known as: NEURONTIN Take one or two at bedtime What changed:   how much to take  how to take this  when to take this  additional instructions   HAIR SKIN & NAILS ADVANCED PO Take 1 tablet by mouth daily.   hydrALAZINE  50 MG tablet Commonly known as: APRESOLINE Take 50 mg by mouth 2 (two) times daily.   lidocaine-prilocaine cream Commonly known as: EMLA Apply 1 application topically as needed.   natalizumab 300 MG/15ML injection Commonly known as: TYSABRI Inject 15 mLs into the vein every 28 (twenty-eight) days.   ondansetron 4 MG disintegrating tablet Commonly known as: ZOFRAN-ODT Take 4 mg by mouth every 8 (eight) hours as needed for nausea or vomiting.   oxybutynin 10 MG 24 hr tablet Commonly known as: DITROPAN-XL Take 1 tablet (10 mg total) by mouth daily.   pantoprazole 20 MG tablet Commonly known as: PROTONIX Take 20 mg by mouth daily.   potassium chloride 10 MEQ CR  capsule Commonly known as: MICRO-K Take 10 mEq by mouth daily.   UNABLE TO FIND Take 2 tablets by mouth daily. Med Name: Bariatric chewable Advanced Multi EA       Allergies:  Allergies  Allergen Reactions  . Darvon [Propoxyphene] Anaphylaxis and Other (See Comments)    Throat swelling  . Oxycodone Anaphylaxis  . Percocet [Oxycodone-Acetaminophen] Anaphylaxis    Family History: Family History  Problem Relation Age of Onset  . Fibromyalgia Mother   . Stroke Father   . Diabetes Father   . Stroke Other     Social History:   reports that she has never smoked. She has never used smokeless tobacco. She reports current alcohol use. She reports that she does not use drugs.  Physical Exam: BP (!) 164/99   Pulse 84   Wt 205 lb (93 kg)   LMP 01/15/2015   BMI 31.17 kg/m   Constitutional:  Alert and oriented, no acute distress, nontoxic appearing HEENT: [place], AT Cardiovascular: No clubbing, cyanosis, or edema Respiratory: Normal respiratory effort, no increased work of breathing Skin: No rashes, bruises or suspicious lesions Neurologic: Grossly intact, no focal deficits, moving all 4 extremities Psychiatric: Normal mood and affect  Laboratory Data: Results for orders placed or performed in visit on 05/05/19  BLADDER SCAN AMB NON-IMAGING  Result Value Ref Range   Scan Result 17 ML    Assessment & Plan:   1. Overactive bladder 59 year old female with a history of MS, stroke, nephrolithiasis, and OAB here for symptom recheck on Ditropan XL.  Symptoms significantly improved after 6-week trial of this medication.  PVR WNL.  Counseled patient to continue this with plans for follow-up in 1 year.  She expressed understanding. - BLADDER SCAN AMB NON-IMAGING   Return in about 1 year (around 05/04/2020) for OAB f/u with Dr. Richardo Hanks.  [name], PA-C  [facility] [address] [place], [place] [address] [phone]

## 2019-05-06 ENCOUNTER — Ambulatory Visit: Payer: Self-pay | Admitting: Physician Assistant

## 2019-05-20 ENCOUNTER — Other Ambulatory Visit: Payer: Self-pay

## 2019-05-20 ENCOUNTER — Ambulatory Visit (HOSPITAL_COMMUNITY)
Admission: RE | Admit: 2019-05-20 | Discharge: 2019-05-20 | Disposition: A | Discharge status: Home / Self Care | Payer: Medicare HMO | Source: Ambulatory Visit | Attending: Neurology | Admitting: Neurology

## 2019-05-20 DIAGNOSIS — G35 Multiple sclerosis: Secondary | ICD-10-CM | POA: Diagnosis not present

## 2019-05-20 MED ORDER — SODIUM CHLORIDE 0.9 % IV SOLN
INTRAVENOUS | Status: DC | PRN
  Administered 2019-05-20: 250 mL via INTRAVENOUS

## 2019-05-20 MED ORDER — HEPARIN SOD (PORK) LOCK FLUSH 100 UNIT/ML IV SOLN
500.0000 [IU] | INTRAVENOUS | Status: AC | PRN
  Administered 2019-05-20: 500 [IU]

## 2019-05-20 MED ORDER — SODIUM CHLORIDE 0.9% FLUSH
10.0000 mL | INTRAVENOUS | Status: AC | PRN
  Administered 2019-05-20: 10 mL

## 2019-05-20 MED ORDER — SODIUM CHLORIDE 0.9 % IV SOLN
300.0000 mg | INTRAVENOUS | Status: DC
  Administered 2019-05-20: 300 mg via INTRAVENOUS
  Filled 2019-05-20: qty 15

## 2019-05-20 NOTE — Progress Notes (Signed)
PATIENT CARE CENTER NOTE  Diagnosis:Multiple Sclerosis   Provider:Sater, Richard MD   Procedure:Tysabri infusion   Note:Patient received Tysabri infusion via right chest PAC. Tolerated well with no adverse reaction. Vital signs remained stable. Patientdid not wantto remain for the 1 hour post-infusion. Discharge instructions given. Patient alert, oriented and ambulatory at discharge.

## 2019-05-20 NOTE — Discharge Instructions (Signed)
Natalizumab injection What is this medicine? NATALIZUMAB (na ta LIZ you mab) is used to treat relapsing multiple sclerosis. This drug is not a cure. It is also used to treat Crohn's disease. This medicine may be used for other purposes; ask your health care provider or pharmacist if you have questions. COMMON BRAND NAME(S): Tysabri What should I tell my health care provider before I take this medicine? They need to know if you have any of these conditions:  immune system problems  progressive multifocal leukoencephalopathy (PML)  an unusual or allergic reaction to natalizumab, other medicines, foods, dyes, or preservatives  pregnant or trying to get pregnant  breast-feeding How should I use this medicine? This medicine is for infusion into a vein. It is given by a health care professional in a hospital or clinic setting. A special MedGuide will be given to you by the pharmacist with each prescription and refill. Be sure to read this information carefully each time. Talk to your pediatrician regarding the use of this medicine in children. This medicine is not approved for use in children. Overdosage: If you think you have taken too much of this medicine contact a poison control center or emergency room at once. NOTE: This medicine is only for you. Do not share this medicine with others. What if I miss a dose? It is important not to miss your dose. Call your doctor or health care professional if you are unable to keep an appointment. What may interact with this medicine? Do not take this medicine with any of the following medications:  biologic medicines such as adalimumab, certolizumab, etanercept, golimumab, infliximab This medicine may also interact with the following medications:  azathioprine  cyclosporine  interferons  6-mercaptopurine  methotrexate  other medicines that lower your chance of fighting an infection  steroid medicines like prednisone or  cortisone  vaccines This list may not describe all possible interactions. Give your health care provider a list of all the medicines, herbs, non-prescription drugs, or dietary supplements you use. Also tell them if you smoke, drink alcohol, or use illegal drugs. Some items may interact with your medicine. What should I watch for while using this medicine? Your condition will be monitored carefully while you are receiving this medicine. Visit your doctor for regular check ups. Tell your doctor or healthcare professional if your symptoms do not start to get better or if they get worse. Stay away from people who are sick. Call your doctor or health care professional for advice if you get a fever, chills or sore throat, or other symptoms of a cold or flu. Do not treat yourself. In some patients, this medicine may cause a serious brain infection that may cause death. If you have any problems seeing, thinking, speaking, walking, or standing, tell your doctor right away. If you cannot reach your doctor, get urgent medical care. What side effects may I notice from receiving this medicine? Side effects that you should report to your doctor or health care professional as soon as possible:  allergic reactions like skin rash, itching or hives, swelling of the face, lips, or tongue  breathing problems  changes in vision  chest pain  confusion  depressed mood  dizziness  feeling faint; lightheaded; falls  general ill feeling or flu-like symptoms  loss of memory  missed menstrual periods  muscle weakness  problems with balance, talking, or walking  signs and symptoms of liver injury like dark yellow or brown urine; general ill feeling or flu-like symptoms; light-colored   stools; loss of appetite; nausea; right upper belly pain; unusually weak or tired; yellowing of the eyes or skin  suicidal thoughts, mood changes  unusual bruising or bleeding  unusually weak or tired Side effects that  usually do not require medical attention (report to your doctor or health care professional if they continue or are bothersome):  headache  joint pain  muscle cramps  muscle pain  nausea, vomiting  pain, redness, or irritation at site where injected  tiredness This list may not describe all possible side effects. Call your doctor for medical advice about side effects. You may report side effects to FDA at 1-800-FDA-1088. Where should I keep my medicine? This drug is given in a hospital or clinic and will not be stored at home. NOTE: This sheet is a summary. It may not cover all possible information. If you have questions about this medicine, talk to your doctor, pharmacist, or health care provider.  2020 Elsevier/Gold Standard (2018-07-19 13:20:26)  

## 2019-06-15 ENCOUNTER — Encounter: Payer: Self-pay | Admitting: *Deleted

## 2019-06-15 ENCOUNTER — Telehealth: Payer: Self-pay | Admitting: *Deleted

## 2019-06-15 NOTE — Telephone Encounter (Signed)
Received email from [employer] (rep with [employer]) that pt would like to try and transition infusions back to our office. Pt enrolled in free drug via [employer] for Tysabri. Authorization via [employer] effective until 08/14/19. Gave updated orders to [facility] with all supporting documentation to see if pt can be transitioned to them. They will submit and keep [place] updated.  I called pt and LVM with above information. Recommended she keep current appt with [facility] on [facility]19 until we can get her hopefully transitioned to our infusion suite. Advised her to call back if she has any further questions.

## 2019-06-16 NOTE — Telephone Encounter (Signed)
Called pt back. [facility] is not open on Friday's either per pt. I recommended she discuss with [facility] when she goes tomorrow. She may have to ask to speak with billing manager there to get things straightened out. She verbalized understanding and appreciation. She will call me back early next week if she runs into any issues.

## 2019-06-16 NOTE — Telephone Encounter (Signed)
Pt called back in regards to infusion states she traditionally gets infusion done on fridays and would like to get infusions at [facility]   due to work . Pt states she usually take about 3 days to recuperate and is good to start work on Monday when receiving infusion on fridays

## 2019-06-17 ENCOUNTER — Other Ambulatory Visit: Payer: Self-pay

## 2019-06-17 ENCOUNTER — Ambulatory Visit (HOSPITAL_COMMUNITY)
Admission: RE | Admit: 2019-06-17 | Discharge: 2019-06-17 | Disposition: A | Discharge status: Home / Self Care | Payer: Medicare HMO | Source: Ambulatory Visit | Attending: Neurology | Admitting: Neurology

## 2019-06-17 DIAGNOSIS — G35 Multiple sclerosis: Secondary | ICD-10-CM | POA: Insufficient documentation

## 2019-06-17 MED ORDER — HEPARIN SOD (PORK) LOCK FLUSH 100 UNIT/ML IV SOLN
500.0000 [IU] | INTRAVENOUS | Status: AC | PRN
  Administered 2019-06-17: 500 [IU]
  Filled 2019-06-17: qty 5

## 2019-06-17 MED ORDER — SODIUM CHLORIDE 0.9 % IV SOLN
300.0000 mg | Freq: Once | INTRAVENOUS | Status: AC
  Administered 2019-06-17: 300 mg via INTRAVENOUS
  Filled 2019-06-17: qty 15

## 2019-06-17 MED ORDER — SODIUM CHLORIDE 0.9% FLUSH
10.0000 mL | INTRAVENOUS | Status: AC | PRN
  Administered 2019-06-17: 10 mL

## 2019-06-17 NOTE — Progress Notes (Signed)
PATIENT CARE CENTER NOTE  Diagnosis:Multiple Sclerosis   Provider:Sater, Richard MD   Procedure:Tysabri infusion   Note:Patient received Tysabri infusionvia right chest PAC. Tolerated well with no adverse reaction. Vital signs remained stable. Patientdid not wantto remain for the 1 hour obervation post-infusion.Discharge instructions given. Patient alert, oriented and ambulatory at discharge.

## 2019-06-20 ENCOUNTER — Telehealth: Payer: Self-pay | Admitting: *Deleted

## 2019-06-20 NOTE — Telephone Encounter (Addendum)
Faxed completed/signed Tysabri pt status report and reauth questionnaire to [facility] at [phone]. Received confirmation.   Received fax notification back that pt is re-authorized from 06/20/19-01/23/2020. Pt enrollment number: [id number]. Account: [facility]. Site [employer]: [id number]. [address] [place] [address]. Phone: [phone] Fax: [phone].

## 2019-07-15 ENCOUNTER — Ambulatory Visit (HOSPITAL_COMMUNITY)
Admission: RE | Admit: 2019-07-15 | Discharge: 2019-07-15 | Disposition: A | Discharge status: Home / Self Care | Payer: Medicare HMO | Source: Ambulatory Visit | Attending: Neurology | Admitting: Neurology

## 2019-07-15 ENCOUNTER — Other Ambulatory Visit: Payer: Self-pay

## 2019-07-15 DIAGNOSIS — G35 Multiple sclerosis: Secondary | ICD-10-CM | POA: Diagnosis present

## 2019-07-15 MED ORDER — SODIUM CHLORIDE 0.9 % IV SOLN
300.0000 mg | INTRAVENOUS | Status: DC
  Administered 2019-07-15: 300 mg via INTRAVENOUS
  Filled 2019-07-15: qty 15

## 2019-07-15 MED ORDER — SODIUM CHLORIDE 0.9% FLUSH
10.0000 mL | INTRAVENOUS | Status: AC | PRN
  Administered 2019-07-15: 10 mL

## 2019-07-15 MED ORDER — HEPARIN SOD (PORK) LOCK FLUSH 100 UNIT/ML IV SOLN
500.0000 [IU] | INTRAVENOUS | Status: AC | PRN
  Administered 2019-07-15: 500 [IU]
  Filled 2019-07-15: qty 5

## 2019-07-15 MED ORDER — SODIUM CHLORIDE 0.9 % IV SOLN
INTRAVENOUS | Status: DC | PRN
  Administered 2019-07-15: 250 mL via INTRAVENOUS

## 2019-07-15 NOTE — Discharge Instructions (Signed)
Natalizumab injection What is this medicine? NATALIZUMAB (na ta LIZ you mab) is used to treat relapsing multiple sclerosis. This drug is not a cure. It is also used to treat Crohn's disease. This medicine may be used for other purposes; ask your health care provider or pharmacist if you have questions. COMMON BRAND NAME(S): Tysabri What should I tell my health care provider before I take this medicine? They need to know if you have any of these conditions:  immune system problems  progressive multifocal leukoencephalopathy (PML)  an unusual or allergic reaction to natalizumab, other medicines, foods, dyes, or preservatives  pregnant or trying to get pregnant  breast-feeding How should I use this medicine? This medicine is for infusion into a vein. It is given by a health care professional in a hospital or clinic setting. A special MedGuide will be given to you by the pharmacist with each prescription and refill. Be sure to read this information carefully each time. Talk to your pediatrician regarding the use of this medicine in children. This medicine is not approved for use in children. Overdosage: If you think you have taken too much of this medicine contact a poison control center or emergency room at once. NOTE: This medicine is only for you. Do not share this medicine with others. What if I miss a dose? It is important not to miss your dose. Call your doctor or health care professional if you are unable to keep an appointment. What may interact with this medicine? Do not take this medicine with any of the following medications:  biologic medicines such as adalimumab, certolizumab, etanercept, golimumab, infliximab This medicine may also interact with the following medications:  azathioprine  cyclosporine  interferons  6-mercaptopurine  methotrexate  other medicines that lower your chance of fighting an infection  steroid medicines like prednisone or  cortisone  vaccines This list may not describe all possible interactions. Give your health care provider a list of all the medicines, herbs, non-prescription drugs, or dietary supplements you use. Also tell them if you smoke, drink alcohol, or use illegal drugs. Some items may interact with your medicine. What should I watch for while using this medicine? Your condition will be monitored carefully while you are receiving this medicine. Visit your doctor for regular check ups. Tell your doctor or healthcare professional if your symptoms do not start to get better or if they get worse. Stay away from people who are sick. Call your doctor or health care professional for advice if you get a fever, chills or sore throat, or other symptoms of a cold or flu. Do not treat yourself. In some patients, this medicine may cause a serious brain infection that may cause death. If you have any problems seeing, thinking, speaking, walking, or standing, tell your doctor right away. If you cannot reach your doctor, get urgent medical care. What side effects may I notice from receiving this medicine? Side effects that you should report to your doctor or health care professional as soon as possible:  allergic reactions like skin rash, itching or hives, swelling of the face, lips, or tongue  breathing problems  changes in vision  chest pain  confusion  depressed mood  dizziness  feeling faint; lightheaded; falls  general ill feeling or flu-like symptoms  loss of memory  missed menstrual periods  muscle weakness  problems with balance, talking, or walking  signs and symptoms of liver injury like dark yellow or brown urine; general ill feeling or flu-like symptoms; light-colored   stools; loss of appetite; nausea; right upper belly pain; unusually weak or tired; yellowing of the eyes or skin  suicidal thoughts, mood changes  unusual bruising or bleeding  unusually weak or tired Side effects that  usually do not require medical attention (report to your doctor or health care professional if they continue or are bothersome):  headache  joint pain  muscle cramps  muscle pain  nausea, vomiting  pain, redness, or irritation at site where injected  tiredness This list may not describe all possible side effects. Call your doctor for medical advice about side effects. You may report side effects to FDA at 1-800-FDA-1088. Where should I keep my medicine? This drug is given in a hospital or clinic and will not be stored at home. NOTE: This sheet is a summary. It may not cover all possible information. If you have questions about this medicine, talk to your doctor, pharmacist, or health care provider.  2020 Elsevier/Gold Standard (2018-07-19 13:20:26)  

## 2019-07-15 NOTE — Progress Notes (Signed)
Patient received300 mg of Tysabri as ordered by Despina Arias MDvia an implanted port that was accessed and de-accessed per protocol.Patient declined the post infusion observation.Tolerated well, vitals stable, discharge instructions given, verbalized understanding. Patient alert, oriented and ambulatory at the time of discharge.

## 2019-08-08 ENCOUNTER — Telehealth: Payer: Self-pay | Admitting: *Deleted

## 2019-08-08 NOTE — Telephone Encounter (Signed)
Received fax notification back that pt is re-authorized from 07/25/19-01/23/20. Pt enrollment number: [id number]. Account: [facility]. Site [employer]: [id number].

## 2019-08-12 ENCOUNTER — Inpatient Hospital Stay (HOSPITAL_COMMUNITY): Admission: RE | Admit: 2019-08-12 | Payer: Medicare HMO | Source: Ambulatory Visit

## 2019-08-23 IMAGING — CT CT ANGIO HEAD
4 of 14 series · 12 of 47 positions shown · IV contrast (APPLIED)
Comparison: Head CT 04/12/2017

ADDENDUM:
On further review of this CTA in the subsequently performed MRI, the
following finding is noted. There is severe stenosis of 1 of the
left opercular (M3 segment) MCA branches, best demonstrated on
series 13, image 27 and series 12, image 21. This location is
compatible with the area of infarction demonstrated on the MRI
performed later the same day.
CLINICAL DATA: Stroke follow-up

EXAM:
CT ANGIOGRAPHY HEAD
TECHNIQUE: Multidetector CT imaging of the head was performed using the
standard protocol during bolus administration of intravenous
contrast. Multiplanar CT image reconstructions and MIPs were
obtained to evaluate the vascular anatomy.
CONTRAST:  50 mL Isovue 370

[Series 7: headangio 2.0 hr36 3 · axial · 0.46mm/px · z∈[-92,-6]mm · 3 of 87 slices shown]
[im 22/87  brain]
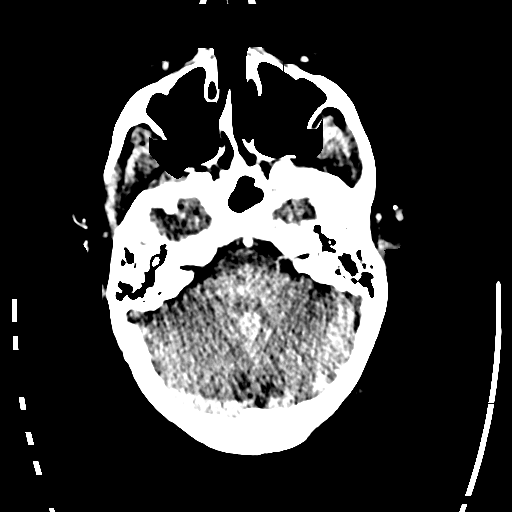
[im 44/87  bone]
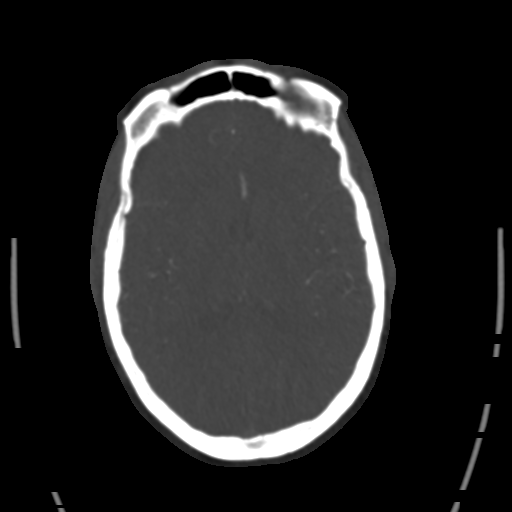
[im 65/87  brain]
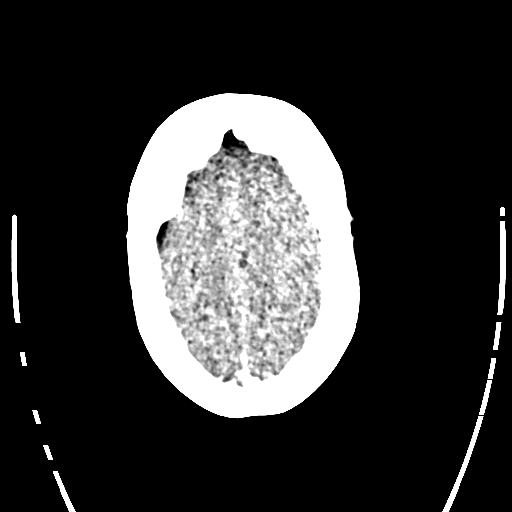

[Series 9: headangio 1.0 mpr cor · coronal · 0.34mm/px · 3 of 211 slices shown]
[im 71/211  brain]
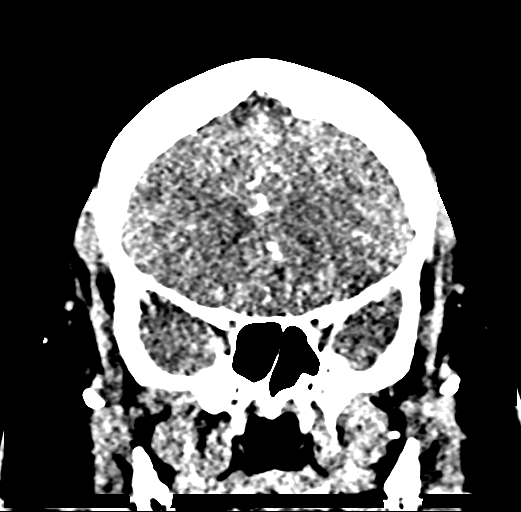
[im 106/211  brain]
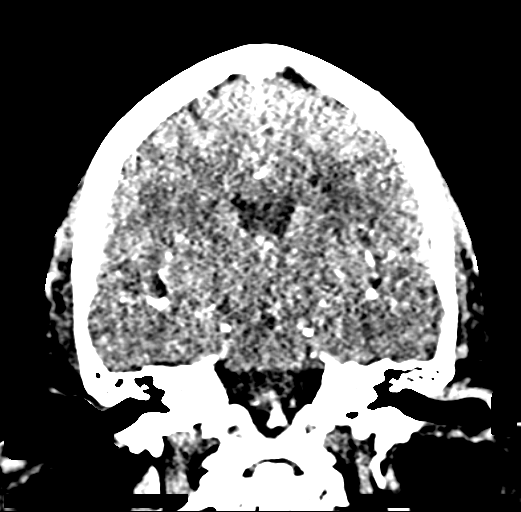
[im 141/211  brain]
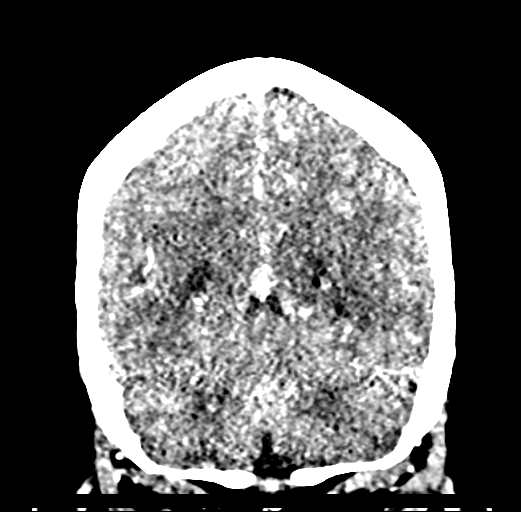

[Series 10: headangio 1.0 mpr sag · sagittal · 0.34mm/px · 2 of 174 slices shown]
[im 58/174  brain]
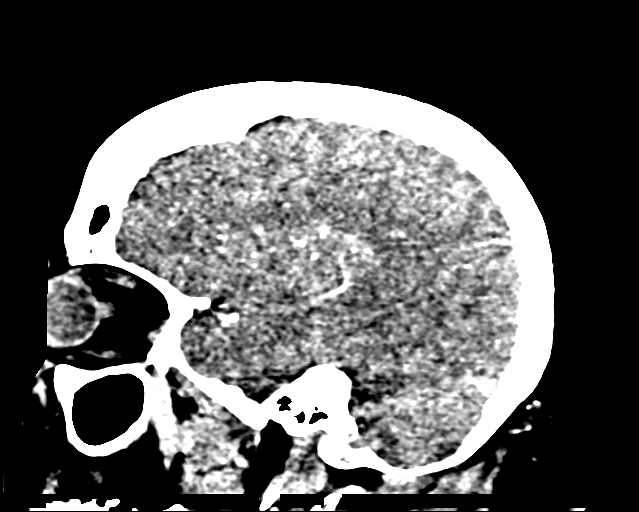
[im 116/174  brain]
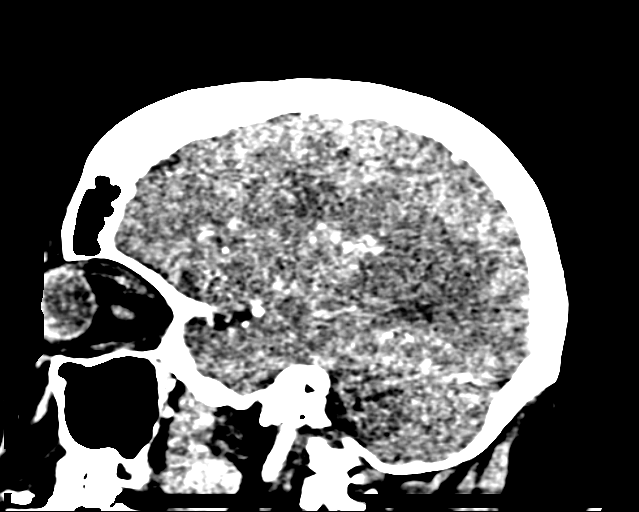

[Series 16: head bone · axial · 0.45mm/px · z∈[-96,+6]mm · 4 of 87 slices shown]
[im 18/87  bone]
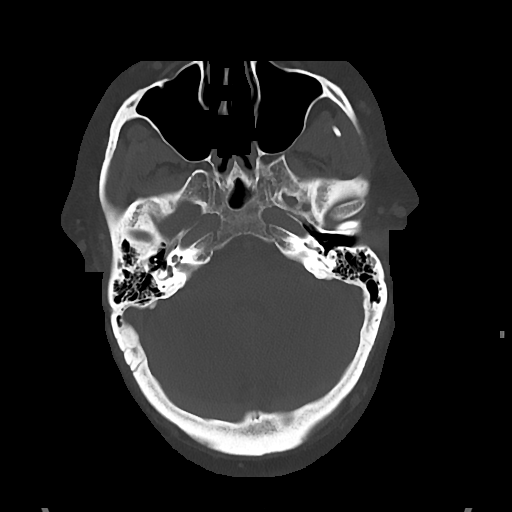
[im 35/87  bone]
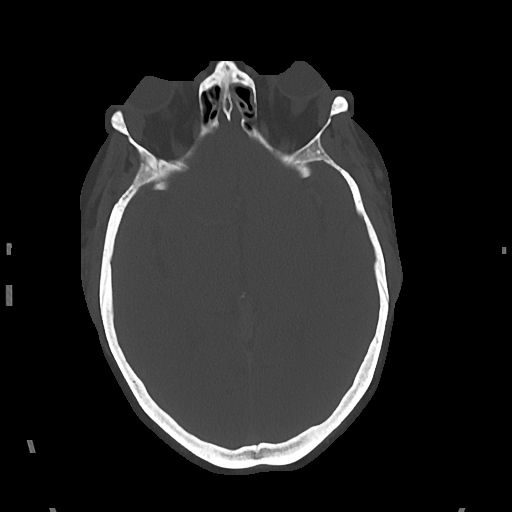
[im 52/87  bone]
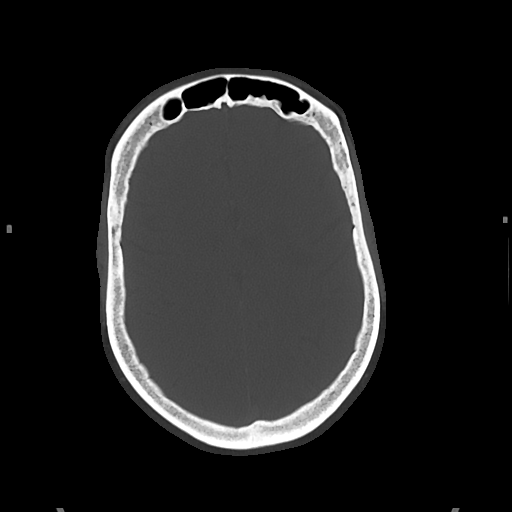
[im 69/87  bone]
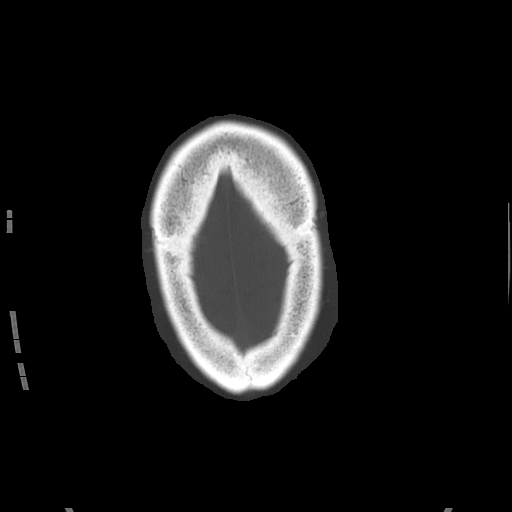

[12 of 47 positions shown; findings below may reference images not displayed]

FINDINGS: Anterior circulation:

--Intracranial internal carotid arteries: Normal.

--Anterior cerebral arteries: Normal. Both A1 segments are present.

--Middle cerebral arteries: Normal.

--Posterior communicating arteries: Present on the left, absent on
the right.

Posterior circulation:

--Basilar artery: Normal.

--Posterior cerebral arteries: Moderate stenosis of the right P1 and
proximal P2 segments. Fetal origin of the left PCA.

--Superior cerebellar arteries: Normal.

--Inferior cerebellar arteries: Normal anterior and posterior
inferior cerebellar arteries.

Venous sinuses: As permitted by contrast timing, patent.

Anatomic variants: None

Delayed phase: There are areas of faint hypoattenuation in the left
frontal white matter corresponding to the ischemia demonstrated on
the prior MRI.

Review of the MIP images confirms the above findings.
IMPRESSION: 1. No emergent large vessel occlusion.
2. Moderate stenosis of the right posterior cerebral artery P1 and
proximal P2 segments.
3. Hypoattenuation in the left frontal white matter consistent with
known recent infarct.

## 2019-08-23 IMAGING — MR MR HEAD W/O CM
6 series · 43 of 48 positions shown · non-contrast
Comparison: CT head 04/08/2017. CT head 04/12/2017. MRI 07/02/2016
and 11/29/2014.

CLINICAL DATA: New onset of slurred speech, without definite
weakness. History of hypertension. History of multiple sclerosis.

EXAM:
MRI HEAD WITHOUT CONTRAST
TECHNIQUE: Multiplanar, multiecho pulse sequences of the brain and surrounding
structures were obtained without intravenous contrast.

[Series 3: DWI · axial · 3.0mm · 0.94mm/px · z∈[-61,+95]mm · 11 of 105 slices shown (1 of 2)]
[im 1/105]
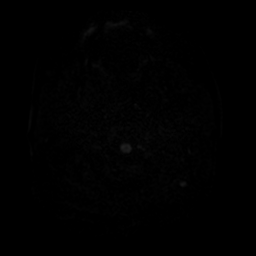
[im 7/105]
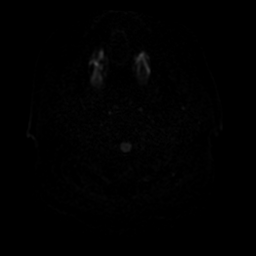
[im 14/105]
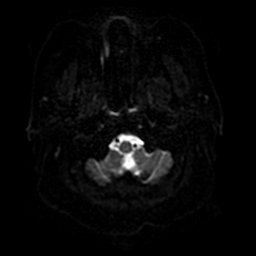
[im 21/105]
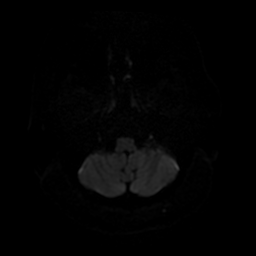
[im 35/105]
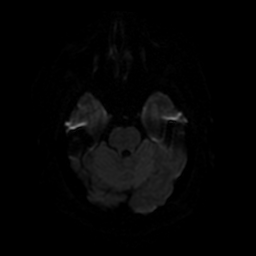
[im 49/105]
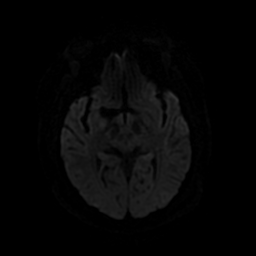
[im 56/105]
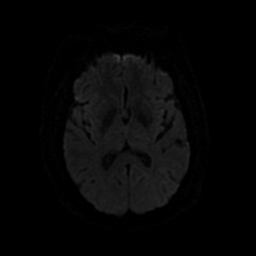
[im 63/105]
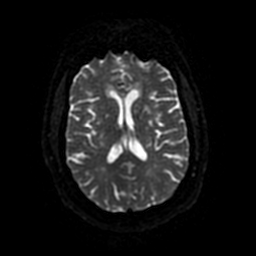
[im 77/105]
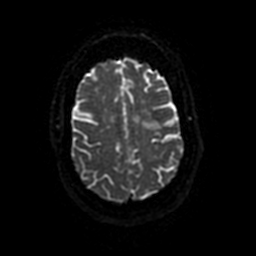
[im 91/105]
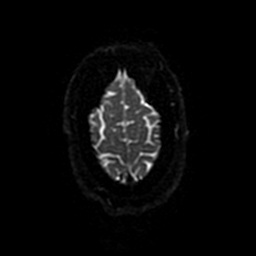
[im 105/105]
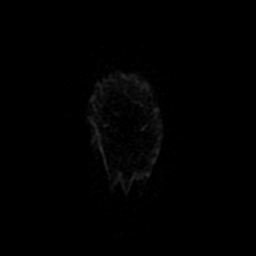

[Series 4: DWI · coronal · 4.0mm · 0.94mm/px · 11 of 72 slices shown (2 of 2)]
[im 1/72]
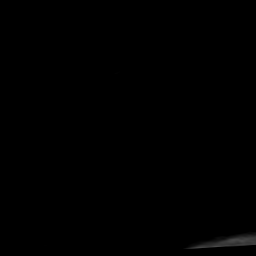
[im 8/72]
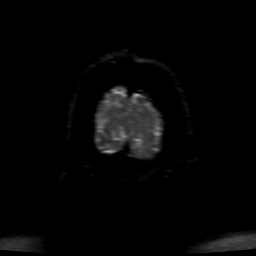
[im 15/72]
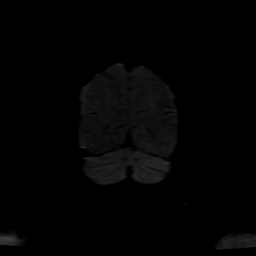
[im 22/72]
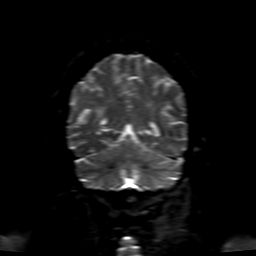
[im 29/72]
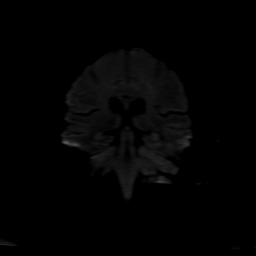
[im 36/72]
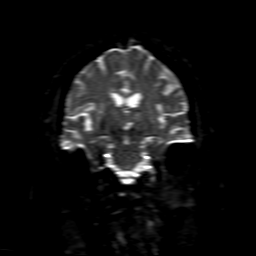
[im 43/72]
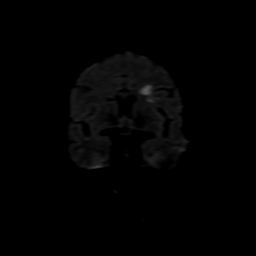
[im 50/72]
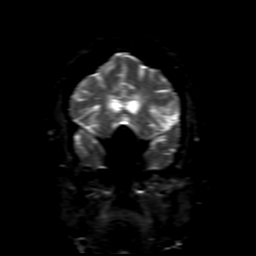
[im 57/72]
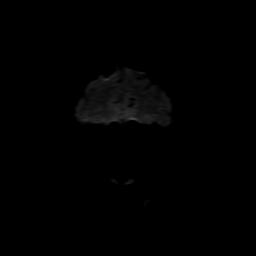
[im 64/72]
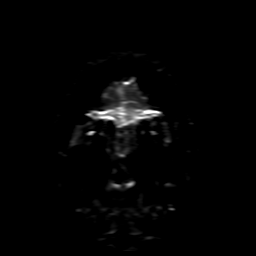
[im 72/72]
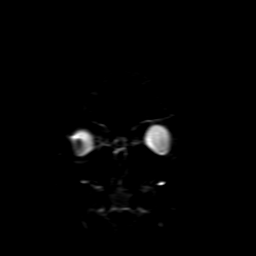

[Series 5: FLAIR · sagittal · 5.0mm · 0.47mm/px · 4 of 27 slices shown]
[im 1/27]
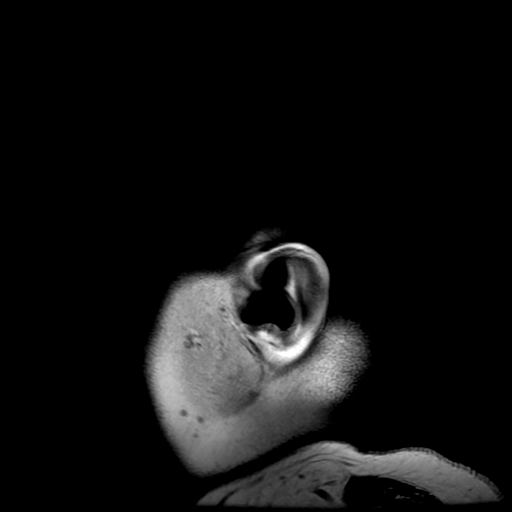
[im 9/27]
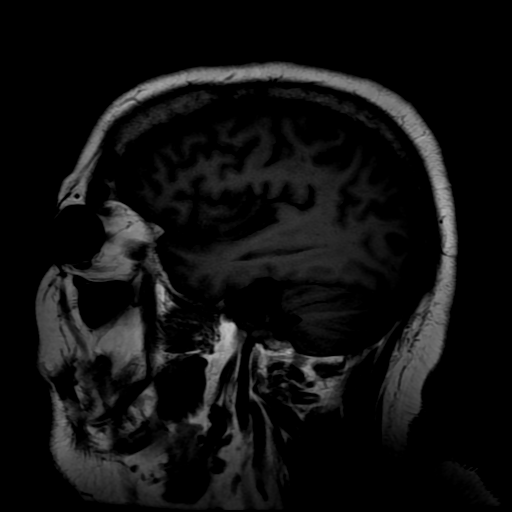
[im 18/27]
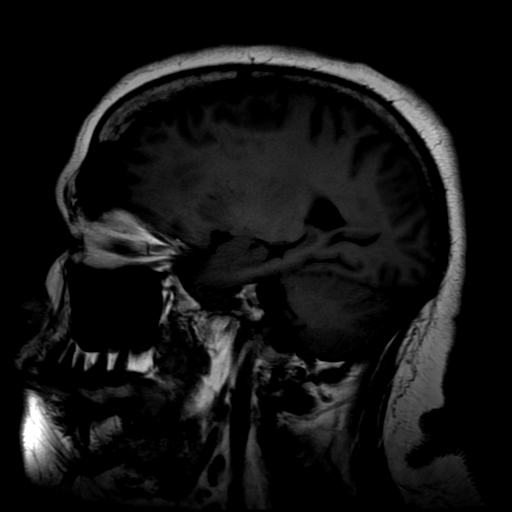
[im 27/27]
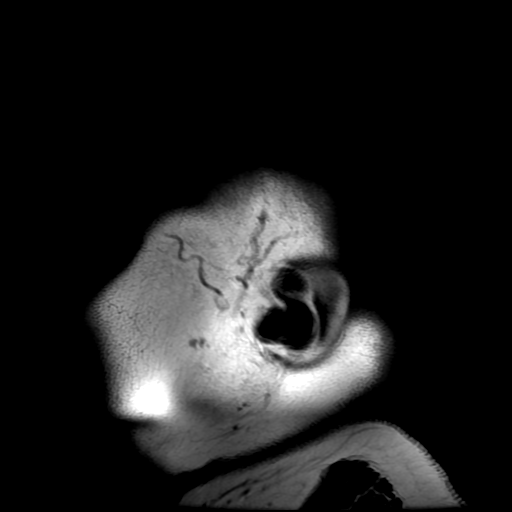

[Series 6: T2 · axial · 5.0mm · 0.47mm/px · z∈[-61,+95]mm · 4 of 27 slices shown]
[im 1/27]
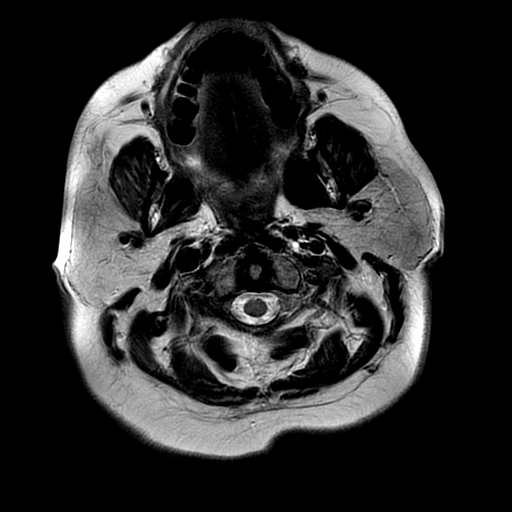
[im 9/27]
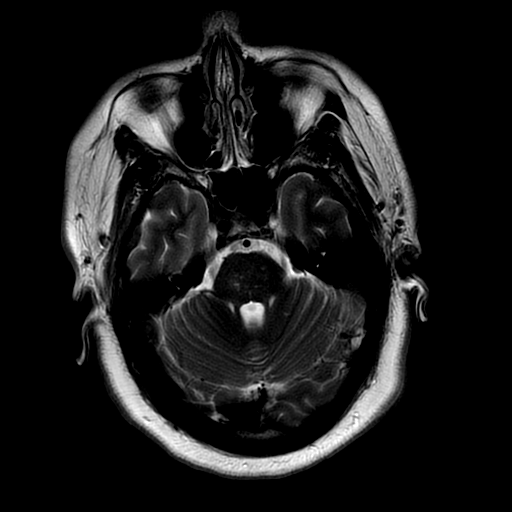
[im 18/27]
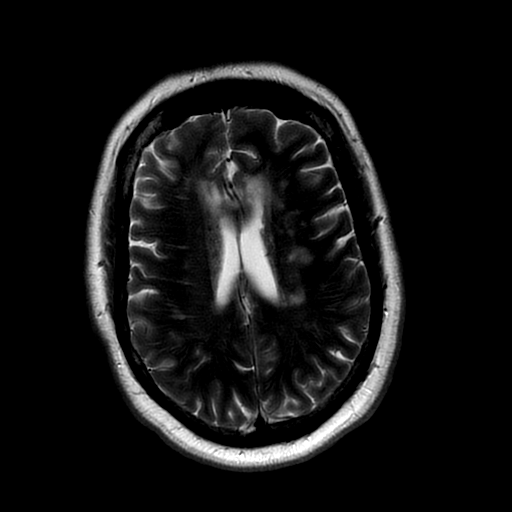
[im 27/27]
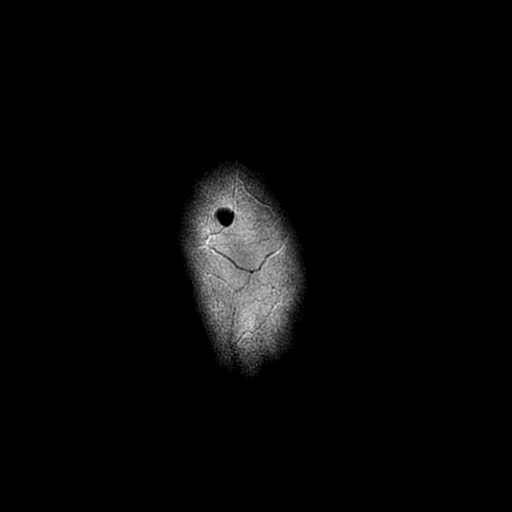

[Series 350: ADC · axial · 3.0mm · 0.94mm/px · z∈[-61,+95]mm · 8 of 53 slices shown (1 of 2)]
[im 1/53]
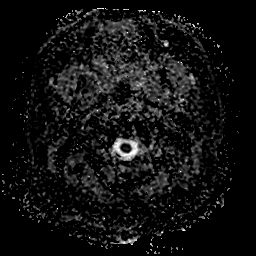
[im 8/53]
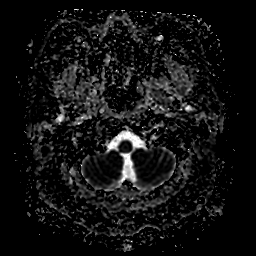
[im 15/53]
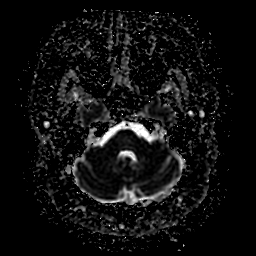
[im 23/53]
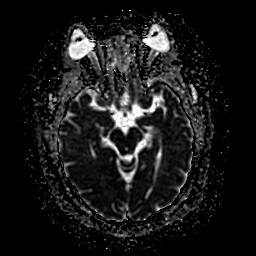
[im 30/53]
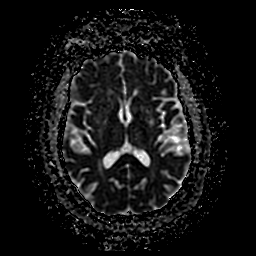
[im 38/53]
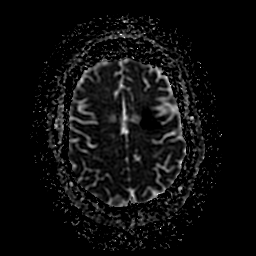
[im 45/53]
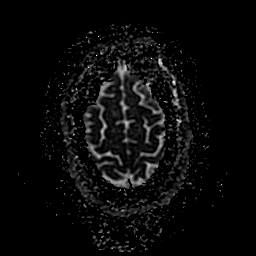
[im 53/53]
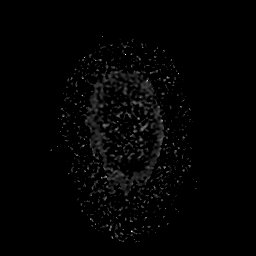

[Series 450: ADC · coronal · 4.0mm · 0.94mm/px · 5 of 36 slices shown (2 of 2)]
[im 1/36]
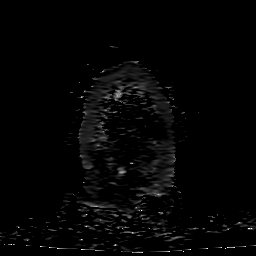
[im 9/36]
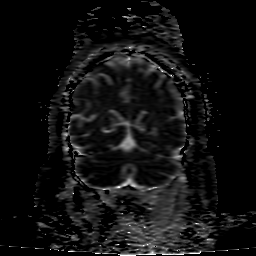
[im 18/36]
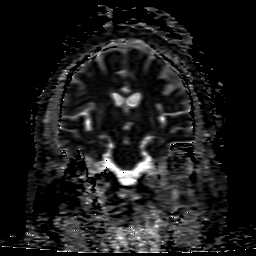
[im 27/36]
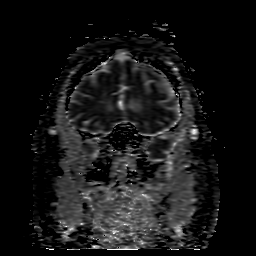
[im 36/36]
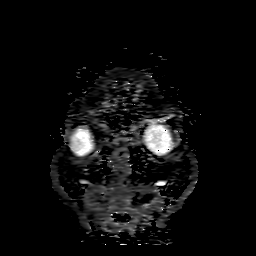

[43 of 48 positions shown; findings below may reference images not displayed]

FINDINGS: The patient prematurely truncated the exam after diffusion, sagittal
T1, and axial T2 images were obtained. Sensitivity and specificity
are reduced.

Brain: Multiple areas of both focal and confluent restricted
diffusion affect the LEFT frontal cortex and subcortical white
matter, with slight involvement of the superior insula, possibly the
superior lentiform nucleus, consistent with acute infarction. These
correspond to LEFT MCA territory ischemia. No hemorrhage, mass
lesion, hydrocephalus, or extra-axial fluid.

Mild premature for age cerebral volume loss. Numerous
periventricular white matter lesions are observed, likely greater
than 10, with corresponding volume loss of the corpus callosum. None
definitely demonstrate restriction. Medial LEFT thalamic T2
hyperintense lesion could be ischemic or related to chronic
demyelination. Similarly, LEFT external capsule lesions could be
ischemic or demyelinating in origin. These are incompletely
evaluated given the premature termination of the study.

Vascular: Normal flow voids.

Skull and upper cervical spine: Normal marrow signal

Sinuses/Orbits: Chronic sinus disease. Symmetric and negative orbits
except for proptosis.

Other: None.

Compared with prior MRs, similar appearance to the suspected
demyelinating lesions.
IMPRESSION: The patient refused to continue the exam, resulting in an incomplete
study. Sensitivity and specificity are reduced.

Multiple areas of both focal and confluent restricted diffusion
within the LEFT MCA territory, LEFT frontal predominant,
representing nonhemorrhagic acute infarction, corresponding with the
recent CT.

Multiple white matter lesions, periventricular predominant,
consistent with chronic multiple sclerosis.

## 2019-08-29 ENCOUNTER — Ambulatory Visit: Payer: Medicare HMO | Admitting: Neurology

## 2019-09-08 ENCOUNTER — Telehealth: Payer: Self-pay | Admitting: *Deleted

## 2019-09-08 ENCOUNTER — Other Ambulatory Visit: Payer: Self-pay | Admitting: Neurology

## 2019-09-08 DIAGNOSIS — G35 Multiple sclerosis: Secondary | ICD-10-CM

## 2019-09-08 NOTE — Telephone Encounter (Signed)
Placed [facility] in [facility] lock box for routine lab pick up. Results pending. 

## 2019-09-08 NOTE — Addendum Note (Signed)
Addended by: Tamera Stands D on: 09/08/2019 02:53 PM   Modules accepted: Orders

## 2019-09-09 LAB — CBC WITH DIFFERENTIAL/PLATELET
Basophils Absolute: 0 10*3/uL (ref 0.0–0.2)
Basos: 1 %
EOS (ABSOLUTE): 0.2 10*3/uL (ref 0.0–0.4)
Eos: 3 %
Hematocrit: 36.1 % (ref 34.0–46.6)
Hemoglobin: 12 g/dL (ref 11.1–15.9)
Immature Grans (Abs): 0 10*3/uL (ref 0.0–0.1)
Immature Granulocytes: 0 %
Lymphocytes Absolute: 2.3 10*3/uL (ref 0.7–3.1)
Lymphs: 29 %
MCH: 29.5 pg (ref 26.6–33.0)
MCHC: 33.2 g/dL (ref 31.5–35.7)
MCV: 89 fL (ref 79–97)
Monocytes Absolute: 0.5 10*3/uL (ref 0.1–0.9)
Monocytes: 6 %
Neutrophils Absolute: 4.8 10*3/uL (ref 1.4–7.0)
Neutrophils: 61 %
Platelets: 190 10*3/uL (ref 150–450)
RBC: 4.07 x10E6/uL (ref 3.77–5.28)
RDW: 13.1 % (ref 11.7–15.4)
WBC: 7.9 10*3/uL (ref 3.4–10.8)

## 2019-09-14 NOTE — Telephone Encounter (Signed)
JCV ab drawn on 09/08/19 negative, index: 0.18

## 2019-09-15 ENCOUNTER — Ambulatory Visit: Payer: Medicare HMO | Admitting: Neurology

## 2019-09-29 ENCOUNTER — Ambulatory Visit: Payer: Medicare HMO | Admitting: Neurology

## 2019-09-29 ENCOUNTER — Encounter: Payer: Self-pay | Admitting: Neurology

## 2019-09-29 ENCOUNTER — Telehealth: Payer: Self-pay | Admitting: Neurology

## 2019-09-29 VITALS — BP 146/87 | HR 61 | Ht 68.0 in | Wt 204.5 lb

## 2019-09-29 DIAGNOSIS — R4701 Aphasia: Secondary | ICD-10-CM

## 2019-09-29 DIAGNOSIS — G35 Multiple sclerosis: Secondary | ICD-10-CM

## 2019-09-29 DIAGNOSIS — G4733 Obstructive sleep apnea (adult) (pediatric): Secondary | ICD-10-CM

## 2019-09-29 DIAGNOSIS — Z9989 Dependence on other enabling machines and devices: Secondary | ICD-10-CM

## 2019-09-29 DIAGNOSIS — Z79899 Other long term (current) drug therapy: Secondary | ICD-10-CM

## 2019-09-29 DIAGNOSIS — R5383 Other fatigue: Secondary | ICD-10-CM

## 2019-09-29 DIAGNOSIS — I6300 Cerebral infarction due to thrombosis of unspecified precerebral artery: Secondary | ICD-10-CM

## 2019-09-29 NOTE — Telephone Encounter (Signed)
[employer] medicare order sent to [facility]. They will obtain the auth and reach out to the patient to schedule.  °

## 2019-09-29 NOTE — Progress Notes (Signed)
[facility]  PATIENT: Rachel Gilbert DOB: 1960-12-14  REFERRING DOCTOR OR PCP:  ER  No PCP SOURCE: ER notes, images on PACS, patient  _________________________________   HISTORICAL  CHIEF COMPLAINT:  Chief Complaint  Patient presents with   Follow-up    RM 13, alone. Last seen 02/28/2019. Doing well, no new sx    Multiple Sclerosis    On Tysabri. Last: 09/08/19, next: 10/13/19. Last JCV 09/08/19 negative, index: 0.18.     HISTORY OF PRESENT ILLNESS:  Rachel Gilbert is 59 y.o. woman with relapsing remitting MS and OSA.    Update 09/29/2019:  She is on Tysabri 300 mg q 4 weeks and tolerates it well.   She has remained JCV Ab negative.   Her MS has been stable.      Her gait is doing well with some stumbles but no falls.   She recently hit her calf and bruised it.   Legs are symmetric and she noted her muscles fatigue easily   She has tingling frequently in legs.  She feel gabapentin 600 mg po qHS has helped sime.  She has urinary frequency but also has hesitancy and she sees urology.  She has had some UTI's .  She was just placed on daily cipro.    She has fatigue many days.    She sleeps well most nights.  She uses CPAP.  She notes some depression/anxiety despite Lexapro.     She is noting mild cognitive issues, word finding, with reduced short term memory and   She had the duodenal switch bariatric procedure and has lost 80 pounds.   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    She has not had her Covid-19 vaccination.   I let her know I believe the benfits are greater than risks.    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   Additionally, CT angiogram appears to show occlusion of 1 of the MCA branches.     She has been placed on aspirin and then Plavix was added.  She had a TEE but no evidence of a PFO or other  source of emboli.  Doppler study showed less than 50% stenosis.  A loop recorder is being considered.    Risk factors:   HTN (on 3 med's), OSA, no DM, no smoking, lipids ok (though Lipitor was added).   .     MS history: She was diagnosed in September 2012 after presenting with diplopia. She was hospitalized and received 5 days of IV steroids and she had MRI and lumbar puncture. She recalls that the lumbar puncture was not consistent with MS but the MRI was.  She was placed on Gilenya in December 2012. She was able to return to work and the diplopia completely resolved. Then in July 2014 she had another exacerbation with visual disturbance bilaterally, gait changes and cognitive changes.  Reportedly, there was a new lesion in the brainstem and also in the spinal cord at that time. She received 5 more days of steroid.    In early May 2016, she had the onset of several symptoms including worse cognitive problems, worse gait problems and weakness in the hands.   She went to the [place] [facility] emergency room on 06/03/2014 and she was evaluated. An admitted and received 3 days of IV Solu-Medrol.The MRI of the brain shows many  white matter lesions. Some are periventricular with an appearance consistent with MS. She also has deep white matter subcortical and juxtacortical foci. Some of these look more consistent with small vessel ischemic changes and most more consistent with multiple sclerosis. She does have new foci in the left vermis of the cerebellum and in the left frontal periventricular region..   She has been po Tysabri since 2016.  Images MRI 09/24/2018:   She has multiple T2/flair hyperintense foci in the periventricular, juxtacortical, subcortical and deep white matter.  There are also foci in the pons and left thalamus.  Some of the foci in the left frontal lobe likely represent the sequela of the acute stroke noted on the 04/15/2017 MRI.  Other foci are more likely to represent demyelination from MS.   None of the foci appear to be acute and there are no new lesions compared to the 2019 study.  Also has mild chronic sphenoid sinusitis.   REVIEW OF SYSTEMS: Constitutional: No fevers, chills, sweats, or change in appetite.   She has fatigue Eyes: No visual changes, double vision, eye pain Ear, nose and throat: No hearing loss, ear pain, nasal congestion, sore throat Cardiovascular: No chest pain, palpitations Respiratory: No shortness of breath at rest or with exertion.   No wheezes GastrointestinaI: No nausea, vomiting, diarrhea, abdominal pain, fecal incontinence Genitourinary: No dysuria, urinary retention.   Frequency better on medications. Musculoskeletal: No neck pain, back pain Integumentary: No rash, pruritus, skin lesions Neurological: as above Psychiatric: No depression at this time.  No anxiety Endocrine: No palpitations, diaphoresis, change in appetite, change in weigh or increased thirst Hematologic/Lymphatic: No anemia, purpura, petechiae. Allergic/Immunologic: No itchy/runny eyes, nasal congestion, recent allergic reactions, rashes  ALLERGIES: Allergies  Allergen Reactions   Darvon [Propoxyphene] Anaphylaxis and Other (See Comments)    Throat swelling   Oxycodone Anaphylaxis   Percocet [Oxycodone-Acetaminophen] Anaphylaxis    HOME MEDICATIONS:  Current Outpatient Medications:    ALPRAZolam (XANAX) 0.5 MG tablet, Take 0.5 mg by mouth 2 (two) times daily as needed for anxiety., Disp: , Rfl:    aspirin EC 81 MG EC tablet, Take 1 tablet (81 mg total) by mouth daily., Disp: 30 tablet, Rfl: 0   atorvastatin (LIPITOR) 80 MG tablet, Take 1 tablet (80 mg total) by mouth daily at 6 PM., Disp: 30 tablet, Rfl: 0   Calcium-Vitamin D (CALTRATE 600 PLUS-VIT D PO), Take 4 tablets by mouth daily., Disp: , Rfl:    carvedilol (COREG) 25 MG tablet, Take 25 mg by mouth 2 (two) times daily with a meal., Disp: , Rfl:    clopidogrel (PLAVIX) 75 MG tablet, Take 1 tablet (75 mg  total) by mouth daily., Disp: 90 tablet, Rfl: 1   Docusate Sodium (DSS) 100 MG CAPS, Stool Softener 100 mg capsule  Take 1 capsule every day by oral route., Disp: , Rfl:    escitalopram (LEXAPRO) 20 MG tablet, Take 20 mg by mouth daily., Disp: , Rfl:    gabapentin (NEURONTIN) 300 MG capsule, Take one or two at bedtime (Patient taking differently: Take 600 mg by mouth at bedtime. ), Disp: 60 capsule, Rfl: 11   hydrALAZINE (APRESOLINE) 50 MG tablet, Take 50 mg by mouth 2 (two) times daily., Disp: , Rfl:    lidocaine-prilocaine (EMLA) cream, Apply 1 application topically as needed., Disp: 30 g, Rfl: 1   Multiple Vitamins-Minerals (HAIR SKIN & NAILS ADVANCED PO), Take 1 tablet by mouth daily. , Disp: , Rfl:    natalizumab (TYSABRI) 300  MG/15ML injection, Inject 15 mLs into the vein every 28 (twenty-eight) days. , Disp: , Rfl:    ondansetron (ZOFRAN-ODT) 4 MG disintegrating tablet, Take 4 mg by mouth every 8 (eight) hours as needed for nausea or vomiting., Disp: , Rfl:    oxybutynin (DITROPAN-XL) 10 MG 24 hr tablet, Take 1 tablet (10 mg total) by mouth daily., Disp: 30 tablet, Rfl: 11   pantoprazole (PROTONIX) 20 MG tablet, Take 20 mg by mouth daily., Disp: , Rfl:    potassium chloride (MICRO-K) 10 MEQ CR capsule, Take 10 mEq by mouth daily. , Disp: , Rfl:    UNABLE TO FIND, Take 2 tablets by mouth daily. Med Name: Bariatric chewable Advanced Multi EA, Disp: , Rfl:   PAST MEDICAL HISTORY: Past Medical History:  Diagnosis Date   Complication of anesthesia    hard time waking up    Hearing loss    Hypertension    Kidney stones    Multiple sclerosis (HCC)    Sleep apnea    Uses CPAP   Stroke (HCC) 03/2017   Vision abnormalities     PAST SURGICAL HISTORY: Past Surgical History:  Procedure Laterality Date   CESAREAN SECTION  1986   CHOLECYSTECTOMY  2000   laparoscopic   IR FLUORO GUIDED NEEDLE PLC ASPIRATION/INJECTION LOC  04/15/2017   IR IMAGING GUIDED PORT INSERTION   10/20/2018   LAPAROSCOPIC GASTRIC RESTRICTIVE DUODENAL PROCEDURE (DUODENAL SWITCH)     RADIOLOGY WITH ANESTHESIA N/A 04/15/2017   Procedure: LUMBER PUNCTURE MRI SCAN;  Surgeon: Radiologist, Medication, MD;  Location: [facility] OR;  Service: Radiology;  Laterality: N/A;   TEE WITHOUT CARDIOVERSION N/A 04/16/2017   Procedure: TRANSESOPHAGEAL ECHOCARDIOGRAM (TEE) WITH LOOP;  Surgeon: Wendall Stade, MD;  Location: [facility] ENDOSCOPY;  Service: Cardiovascular;  Laterality: N/A;    FAMILY HISTORY: Family History  Problem Relation Age of Onset   Fibromyalgia Mother    Stroke Father    Diabetes Father    Stroke Other     SOCIAL HISTORY:  Social History   Socioeconomic History   Marital status: Single    Spouse name: Not on file   Number of children: Not on file   Years of education: Not on file   Highest education level: Not on file  Occupational History   Occupation: disabled  Tobacco Use   Smoking status: Never Smoker   Smokeless tobacco: Never Used  Building services engineer Use: Never used  Substance and Sexual Activity   Alcohol use: Yes    Comment: Occasional   Drug use: No   Sexual activity: Yes    Birth control/protection: None  Other Topics Concern   Not on file  Social History Narrative   Not on file   Social Determinants of Health   Financial Resource Strain:    Difficulty of Paying Living Expenses: Not on file  Food Insecurity:    Worried About Programme researcher, broadcasting/film/video in the Last Year: Not on file   [employer] of Food in the Last Year: Not on file  Transportation Needs:    Lack of Transportation (Medical): Not on file   Lack of Transportation (Non-Medical): Not on file  Physical Activity:    Days of Exercise per Week: Not on file   Minutes of Exercise per Session: Not on file  Stress:    Feeling of Stress : Not on file  Social Connections:    Frequency of Communication with Friends and Family: Not on file   Frequency  of Social Gatherings with  Friends and Family: Not on file   Attends Religious Services: Not on file   Active Member of Clubs or Organizations: Not on file   Attends Banker Meetings: Not on file   Marital Status: Not on file  Intimate Partner Violence:    Fear of Current or Ex-Partner: Not on file   Emotionally Abused: Not on file   Physically Abused: Not on file   Sexually Abused: Not on file     PHYSICAL EXAM  Vitals:   09/29/19 0933  BP: (!) 146/87  Pulse: 61  Weight: 204 lb 8 oz (92.8 kg)  Height:  (1.727 m)    Body mass index is 31.09 kg/m.   General: The patient is well-developed and well-nourished and in no acute distress  Neurologic Exam  Mental status: The patient is alert and oriented x 3 at the time of the examination.   Good affect today.  She is alert and oriented.     Focus and attention are good today.  No obvious aphasia today.  Cranial nerves: Extraocular movements are full.  Color vision is symmetric.  Pupils react equally to light and accommodation.  Facial strength and sensation is normal.  Trapezius strength is normal.  Minimal dysarthria.  Hearing was symmetric.  Motor:  Muscle bulk is normal.  Muscle tone is normal in the arms are mildly increased in the left leg.  Strength is 5/5.except 4+/5 left toe extension.   Sensory: Sensory testing shows intact touch sensation in the arms and legs  Coordination: Finger-nose-finger is performed well.  Heel-to-shin is reduced on the left.  Gait and station: Station is normal.  The gait is mildly wide.  Tandem gait is poor..    Romberg is negative.   Reflexes: Deep tendon reflexes are symmetric and slightly increased in legs.       DIAGNOSTIC DATA (LABS, IMAGING, TESTING) - I reviewed patient records, labs, notes, testing and imaging myself where available.  Lab Results  Component Value Date   WBC 7.9 09/08/2019   HGB 12.0 09/08/2019   HCT 36.1 09/08/2019   MCV 89 09/08/2019   PLT 190 09/08/2019       Component Value Date/Time   NA 143 02/11/2019 0516   NA 143 02/24/2018 1332   K 4.4 02/11/2019 0516   CL 109 02/11/2019 0516   CO2 25 02/11/2019 0516   GLUCOSE 100 (H) 02/11/2019 0516   BUN 17 02/11/2019 0516   BUN 19 02/24/2018 1332   CREATININE 1.25 (H) 02/11/2019 0516   CALCIUM 9.0 02/11/2019 0516   PROT 6.3 (L) 02/11/2019 0516   PROT 5.9 (L) 02/24/2018 1332   ALBUMIN 4.0 02/11/2019 0516   ALBUMIN 4.3 02/24/2018 1332   AST 30 02/11/2019 0516   ALT 57 (H) 02/11/2019 0516   ALKPHOS 139 (H) 02/11/2019 0516   BILITOT 0.9 02/11/2019 0516   BILITOT 0.9 02/24/2018 1332   GFRNONAA 48 (L) 02/11/2019 0516   GFRAA 55 (L) 02/11/2019 0516      ASSESSMENT AND PLAN    1. Multiple sclerosis (HCC)   2. Cerebrovascular accident (CVA) due to thrombosis of precerebral artery (HCC)   3. Obstructive sleep apnea on CPAP   4. Aphasia   5. Other fatigue      1.   She will continue Tysabri.   JCV antibody was recently checked and is negative.  We will check an MRI of the brain to determine if there is any subclinical  disease progression and consider a different disease modifying therapy if this is occurring. 2.   Continue plavix plus aspirin.    3.   Stay active and exercise as tolerated.     4.   She is doing well with CPAP and will continue the current settings.  If she is able to keep weight off she may be able to reduce pressures and I will consider doing a home sleep study once weight is stable for at least a few months. 5.   She will return in 6 months for f/u with me or call sooner if problems    Catelynn Sparger A. Epimenio Foot, MD, PhD, FAAN Certified in Neurology, Clinical Neurophysiology, Sleep Medicine, Pain Medicine and Neuroimaging Director, [facility]  [facility] [address], [address] [place], [place] [address] [phone]

## 2019-10-04 NOTE — Telephone Encounter (Signed)
[employer] medicare Berkley Harvey: [id number] (exp. 09/30/19 to 03/28/20) patient is scheduled at GI for 10/15/19.

## 2019-10-15 ENCOUNTER — Other Ambulatory Visit: Payer: Self-pay

## 2019-10-15 ENCOUNTER — Ambulatory Visit
Admission: RE | Admit: 2019-10-15 | Discharge: 2019-10-15 | Disposition: A | Discharge status: Home / Self Care | Payer: Medicare HMO | Source: Ambulatory Visit | Attending: Neurology | Admitting: Neurology

## 2019-10-15 DIAGNOSIS — G35 Multiple sclerosis: Secondary | ICD-10-CM

## 2019-10-15 DIAGNOSIS — I6300 Cerebral infarction due to thrombosis of unspecified precerebral artery: Secondary | ICD-10-CM

## 2019-10-17 ENCOUNTER — Telehealth: Payer: Self-pay | Admitting: *Deleted

## 2019-10-17 NOTE — Telephone Encounter (Signed)
Called, LVM for pt about MRI results per Dr. Epimenio Foot note.

## 2019-10-17 NOTE — Telephone Encounter (Signed)
-----   Message from Asa Lente, MD sent at 10/16/2019  7:06 PM EDT ----- Please let her know that the MRI of the brain looks about the same as previous one.  There do not appear to be any new MS lesions or strokes.

## 2019-12-26 ENCOUNTER — Telehealth: Payer: Self-pay | Admitting: *Deleted

## 2019-12-26 NOTE — Telephone Encounter (Addendum)
Faxed completed/signed Tysabri pt status report and reauth questionnaire to MS touch at [phone]. Received confirmation.   Received fax notification back that pt is re-authorized from11/29/2021-07/24/2020. Pt enrollment number:[id number]. Account:[employer]Site Caffie Pinto: [id number].

## 2020-01-11 ENCOUNTER — Other Ambulatory Visit: Payer: Self-pay | Admitting: *Deleted

## 2020-01-11 ENCOUNTER — Other Ambulatory Visit: Payer: Self-pay | Admitting: Neurology

## 2020-01-11 MED ORDER — METHYLPHENIDATE HCL 10 MG PO TABS: ORAL_TABLET | ORAL | 0 refills | Status: DC

## 2020-01-11 MED ORDER — CLOPIDOGREL BISULFATE 75 MG PO TABS: 75.0000 mg | ORAL_TABLET | Freq: Every day | ORAL | 3 refills | Status: DC

## 2020-02-06 ENCOUNTER — Telehealth: Payer: Self-pay | Admitting: Neurology

## 2020-02-06 NOTE — Telephone Encounter (Signed)
Pt called, physician to call insurance for authorization for infusion. My infusion is scheduled for 1/13 at 2:30p.Would like a call from the nurse.   Also here is my insurance info: Member ID: [id number] BIN: [id number] PCN: [id number]

## 2020-02-06 NOTE — Telephone Encounter (Signed)
Call information given to [facility] RN, infusion suite. She will call the patient to discuss.

## 2020-02-08 DIAGNOSIS — G2581 Restless legs syndrome: Secondary | ICD-10-CM | POA: Diagnosis not present

## 2020-02-08 DIAGNOSIS — G35 Multiple sclerosis: Secondary | ICD-10-CM | POA: Diagnosis not present

## 2020-02-08 DIAGNOSIS — R69 Illness, unspecified: Secondary | ICD-10-CM | POA: Diagnosis not present

## 2020-02-08 DIAGNOSIS — R269 Unspecified abnormalities of gait and mobility: Secondary | ICD-10-CM | POA: Diagnosis not present

## 2020-02-09 DIAGNOSIS — G35 Multiple sclerosis: Secondary | ICD-10-CM | POA: Diagnosis not present

## 2020-03-06 DIAGNOSIS — G2581 Restless legs syndrome: Secondary | ICD-10-CM | POA: Diagnosis not present

## 2020-03-06 DIAGNOSIS — R69 Illness, unspecified: Secondary | ICD-10-CM | POA: Diagnosis not present

## 2020-03-06 DIAGNOSIS — G35 Multiple sclerosis: Secondary | ICD-10-CM | POA: Diagnosis not present

## 2020-03-06 DIAGNOSIS — R269 Unspecified abnormalities of gait and mobility: Secondary | ICD-10-CM | POA: Diagnosis not present

## 2020-03-08 ENCOUNTER — Telehealth: Payer: Self-pay | Admitting: *Deleted

## 2020-03-08 ENCOUNTER — Other Ambulatory Visit: Payer: Self-pay | Admitting: *Deleted

## 2020-03-08 DIAGNOSIS — G35 Multiple sclerosis: Secondary | ICD-10-CM

## 2020-03-08 DIAGNOSIS — Z79899 Other long term (current) drug therapy: Secondary | ICD-10-CM | POA: Diagnosis not present

## 2020-03-08 NOTE — Addendum Note (Signed)
Addended by: Tamera Stands D on: 03/08/2020 02:40 PM   Modules accepted: Orders

## 2020-03-08 NOTE — Telephone Encounter (Signed)
Placed [facility] in [facility] lock box for routine lab pick up. Results pending. 

## 2020-03-09 LAB — CBC WITH DIFFERENTIAL/PLATELET
Basophils Absolute: 0 10*3/uL (ref 0.0–0.2)
Basos: 1 %
EOS (ABSOLUTE): 0.2 10*3/uL (ref 0.0–0.4)
Eos: 3 %
Hematocrit: 36.9 % (ref 34.0–46.6)
Hemoglobin: 12.2 g/dL (ref 11.1–15.9)
Immature Grans (Abs): 0 10*3/uL (ref 0.0–0.1)
Immature Granulocytes: 1 %
Lymphocytes Absolute: 2.5 10*3/uL (ref 0.7–3.1)
Lymphs: 31 %
MCH: 29.1 pg (ref 26.6–33.0)
MCHC: 33.1 g/dL (ref 31.5–35.7)
MCV: 88 fL (ref 79–97)
Monocytes Absolute: 0.5 10*3/uL (ref 0.1–0.9)
Monocytes: 6 %
Neutrophils Absolute: 4.6 10*3/uL (ref 1.4–7.0)
Neutrophils: 58 %
Platelets: 182 10*3/uL (ref 150–450)
RBC: 4.19 x10E6/uL (ref 3.77–5.28)
RDW: 13.4 % (ref 11.7–15.4)
WBC: 7.8 10*3/uL (ref 3.4–10.8)

## 2020-03-13 ENCOUNTER — Other Ambulatory Visit: Payer: Self-pay

## 2020-03-13 ENCOUNTER — Ambulatory Visit (LOCAL_COMMUNITY_HEALTH_CENTER): Payer: Medicare HMO

## 2020-03-13 DIAGNOSIS — Z111 Encounter for screening for respiratory tuberculosis: Secondary | ICD-10-CM

## 2020-03-15 ENCOUNTER — Telehealth: Payer: Self-pay | Admitting: *Deleted

## 2020-03-15 LAB — STRATIFY JCV AB (W/ INDEX) W/ RFLX
Index Value: 0.26 — ABNORMAL HIGH
Stratify JCV (TM) Ab w/Reflex Inhibition: UNDETERMINED — AB

## 2020-03-15 LAB — RFLX STRATIFY JCV (TM) AB INHIBITION: JCV Antibody by Inhibition: POSITIVE

## 2020-03-15 NOTE — Telephone Encounter (Signed)
-----   Message from Asa Lente, MD sent at 03/15/2020  9:24 AM EST ----- Kara Mead, Ms. Senna's JCV antibody is low positive.  We need to change her Tysabri infusions to every 6 weeks.  Please let the patient know that as long as it stays in the low positive range we will keep her on Tysabri but if the next reading is much higher we may want to consider a different medication.

## 2020-03-15 NOTE — Telephone Encounter (Signed)
Called pt. Relayed results per Dr. Bonnita Hollow note. Her last infusion was 03/08/20. R/s her next infusion to 04/19/20 (to be 6 wks after last one) at 2pm with Liane,RN. Liane,RN will r/s her other infusions that were in the future to be q 6wks and will give pt updated list of appt when she comes for next infusion. We will also get new orders from Dr. Epimenio Foot.

## 2020-03-16 ENCOUNTER — Other Ambulatory Visit: Payer: Self-pay

## 2020-03-16 ENCOUNTER — Ambulatory Visit (LOCAL_COMMUNITY_HEALTH_CENTER): Payer: Medicare HMO

## 2020-03-16 DIAGNOSIS — Z111 Encounter for screening for respiratory tuberculosis: Secondary | ICD-10-CM

## 2020-03-16 LAB — TB SKIN TEST
Induration: 6 mm
TB Skin Test: NEGATIVE

## 2020-03-29 ENCOUNTER — Ambulatory Visit: Payer: Medicare HMO | Admitting: Family Medicine

## 2020-03-29 ENCOUNTER — Other Ambulatory Visit: Payer: Self-pay

## 2020-03-29 ENCOUNTER — Encounter: Payer: Self-pay | Admitting: Family Medicine

## 2020-03-29 VITALS — BP 155/85 | HR 52 | Ht 69.0 in | Wt 203.0 lb

## 2020-03-29 DIAGNOSIS — I6300 Cerebral infarction due to thrombosis of unspecified precerebral artery: Secondary | ICD-10-CM | POA: Diagnosis not present

## 2020-03-29 DIAGNOSIS — Z79899 Other long term (current) drug therapy: Secondary | ICD-10-CM | POA: Diagnosis not present

## 2020-03-29 DIAGNOSIS — G2581 Restless legs syndrome: Secondary | ICD-10-CM | POA: Diagnosis not present

## 2020-03-29 DIAGNOSIS — R5383 Other fatigue: Secondary | ICD-10-CM | POA: Diagnosis not present

## 2020-03-29 DIAGNOSIS — R4701 Aphasia: Secondary | ICD-10-CM

## 2020-03-29 DIAGNOSIS — G4733 Obstructive sleep apnea (adult) (pediatric): Secondary | ICD-10-CM | POA: Diagnosis not present

## 2020-03-29 DIAGNOSIS — R69 Illness, unspecified: Secondary | ICD-10-CM | POA: Diagnosis not present

## 2020-03-29 DIAGNOSIS — G35 Multiple sclerosis: Secondary | ICD-10-CM

## 2020-03-29 DIAGNOSIS — R269 Unspecified abnormalities of gait and mobility: Secondary | ICD-10-CM

## 2020-03-29 DIAGNOSIS — F418 Other specified anxiety disorders: Secondary | ICD-10-CM

## 2020-03-29 DIAGNOSIS — Z9989 Dependence on other enabling machines and devices: Secondary | ICD-10-CM

## 2020-03-29 MED ORDER — METHYLPHENIDATE HCL 10 MG PO TABS: ORAL_TABLET | ORAL | 0 refills | Status: DC

## 2020-03-29 NOTE — Progress Notes (Signed)
I have read the note, and I agree with the clinical assessment and plan.  Richard A. Sater, MD, PhD, FAAN Certified in Neurology, Clinical Neurophysiology, Sleep Medicine, Pain Medicine and Neuroimaging  [facility] [address], [address] [place], [place] [address] [phone]  

## 2020-03-29 NOTE — Progress Notes (Signed)
I have read the note, and I agree with the clinical assessment and plan.  Richard A. Sater, MD, PhD, FAAN Certified in Neurology, Clinical Neurophysiology, Sleep Medicine, Pain Medicine and Neuroimaging  [facility] [address], [address] [place], [place] [address] [phone]  

## 2020-03-29 NOTE — Patient Instructions (Signed)
Below is our plan:  We will continue current treatment plan. Will start Tysabri infusions every 6 weeks this month. Continue close follow up with PCP. Keep an eye on BP readings.   Please make sure you are staying well hydrated. I recommend 50-60 ounces daily. Well balanced diet and regular exercise encouraged. Consistent sleep schedule with 6-8 hours recommended.   Please continue follow up with care team as directed.   Follow up with Dr Epimenio Foot in 6 months.   You may receive a survey regarding today's visit. I encourage you to leave honest feed back as I do use this information to improve patient care. Thank you for seeing me today!      Multiple Sclerosis Multiple sclerosis (MS) is a disease of the brain, spinal cord, and optic nerves (central nervous system). It causes the body's disease-fighting (immune) system to destroy the protective covering (myelin sheath) around nerves in the brain. When this happens, signals (nerve impulses) going to and from the brain and spinal cord do not get sent properly or may not get sent at all. There are several types of MS:  Relapsing-remitting MS. This is the most common type. This causes sudden attacks of symptoms. After an attack, you may recover completely until the next attack, or some symptoms may remain permanently.  Secondary progressive MS. This usually develops after the onset of relapsing-remitting MS. Similar to relapsing-remitting MS, this type also causes sudden attacks of symptoms. Attacks may be less frequent, but symptoms slowly get worse (progress) over time.  Primary progressive MS. This causes symptoms that steadily progress over time. This type of MS does not cause sudden attacks of symptoms. The age of onset of MS varies, but it often develops between 10-55 years of age. MS is a lifelong (chronic) condition. There is no cure, but treatment can help slow down the progression of the disease. What are the causes? The cause of this  condition is not known. What increases the risk? You are more likely to develop this condition if:  You are a woman.  You have a relative with MS. However, the condition is not passed from parent to child (inherited).  You have a lack (deficiency) of vitamin D.  You smoke. MS is more common in the [place] than in the [place]. What are the signs or symptoms? Relapsing-remitting and secondary progressive MS cause symptoms to occur in episodes or attacks that may last weeks to months. There may be long periods between attacks in which there are almost no symptoms. Primary progressive MS causes symptoms to steadily progress after they develop. Symptoms of MS vary because of the many different ways it affects the central nervous system. The main symptoms include:  Vision problems and eye pain.  Numbness and weakness.  Inability to move your arms, hands, feet, or legs (paralysis).  Balance problems.  Shaking that you cannot control (tremors).  Muscle spasms.  Problems with thinking (cognitive changes). MS can also cause symptoms that are associated with the disease, but are not always the direct result of an MS attack. They may include:  Inability to control urination or bowel movements (incontinence).  Headaches.  Fatigue.  Inability to tolerate heat.  Emotional changes.  Depression.  Pain. How is this diagnosed? This condition is diagnosed based on:  Your symptoms.  A neurological exam. This involves checking central nervous system function, such as nerve function, reflexes, and coordination.  MRIs of the brain and spinal cord.  Lab tests,  including a lumbar puncture that tests the fluid that surrounds the brain and spinal cord (cerebrospinal fluid).  Tests to measure the electrical activity of the brain in response to stimulation (evoked potentials). How is this treated? There is no cure for MS, but medicines can help decrease the  number and frequency of attacks and help relieve nuisance symptoms. Treatment options may include:  Medicines that reduce the frequency of attacks. These medicines may be given by injection, by mouth (orally), or through an IV.  Medicines that reduce inflammation (steroids). These may provide short-term relief of symptoms.  Medicines to help control pain, depression, fatigue, or incontinence.  Nutritional counseling. Vitamin D supplements, if you have a deficiency.  Using devices to help you move around (assistive devices), such as braces, a cane, or a walker.  Physical therapy to strengthen and stretch your muscles.  Occupational therapy to help you with everyday tasks.  Alternative or complementary treatments such as exercise, massage, or acupuncture.   Follow these instructions at home:  Take over-the-counter and prescription medicines only as told by your health care provider.  Do not drive or use heavy machinery while taking prescription pain medicine.  Use assistive devices as recommended by your physical therapist or your health care provider.  Exercise as directed by your health care provider.  Eating healthy can help manage MS symptoms.  Return to your normal activities as told by your health care provider. Ask your health care provider what activities are safe for you.  Reach out for support. Share your feelings with friends, family, or a support group.  Keep all follow-up visits as told by your health care provider and therapists. This is important. Where to find more information  National Multiple Sclerosis Society: https://www.nationalmssociety.org  [employer] of Neurological Disorders and Stroke: https://johnson-smith.net/  [employer] for Complementary and Integrative Health: http://miller-hamilton.net/ Contact a health care provider if:  You feel depressed.  You develop new pain or numbness.  You have tremors.  You have problems with sexual  function. Get help right away if:  You develop paralysis.  You develop numbness.  You have problems with your bladder or bowel function.  You develop double vision.  You lose vision in one or both eyes.  You develop suicidal thoughts.  You develop severe confusion. If you ever feel like you may hurt yourself or others, or have thoughts about taking your own life, get help right away. You can go to your nearest emergency department or call:  Your local emergency services (911 in the [place]  A suicide crisis helpline, such as the National Suicide Prevention Lifeline at 504-780-9266. This is open 24 hours a day. Summary  Multiple sclerosis (MS) is a disease of the central nervous system that causes the body's immune system to destroy the protective covering (myelin sheath) around nerves in the brain.  There are 3 types of MS: relapsing-remitting, secondary progressive, and primary progressive. Relapsing-remitting and secondary progressive MS cause symptoms to occur in episodes or attacks that may last weeks to months. Primary progressive MS causes symptoms to steadily progress after they develop.  There is no cure for MS, but medicines can help decrease the number and frequency of attacks and help relieve nuisance symptoms. Treatment may also include physical or occupational therapy.  If you develop numbness, paralysis, vision problems, or other neurological symptoms, get help right away. This information is not intended to replace advice given to you by your health care provider. Make sure you discuss any questions  you have with your health care provider. Document Revised: 10/25/2019 Document Reviewed: 10/25/2019 Elsevier Patient Education  2021 [employer].

## 2020-03-29 NOTE — Progress Notes (Signed)
Chief Complaint  Patient presents with  . Follow-up    Rm 1 alone Pt is sleeps well,  MS stable      HISTORY OF PRESENT ILLNESS: 03/29/20 ALL:  Rachel Gilbert is a 60 y.o. female here today for follow up for RRMS and OSA on CPAP. She continues Tysabri. 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  Infusions now at 6wk intervals.  MRI brain 09/2019 stable. Multiple chronic microhemorrhages in deep gray matter most consistent with hypertensive change.   She feels MS symptoms are  Gait is . She stumbles from time to time. Using cane sometimes. No falls. Gabapentin helps with RLS.   She continues methylphenidate 20mg  in mornings and 10mg  in afternoon. This helps with inattention and MS fatigue. She is sleeping well. She does have some anxiety and depression. She feels symptoms are stable on escitalopram. She has participated in grief counseling that seemed to help.   She continues Plavix and atorvastatin 80mg . No TIA/stroke symptoms. She is followed closely by PCP. BP has been fairly normal. She reports readings are usually higher 120-80-lower 130's/80s at home.   She continues CPAP. Compliance at 100%. Residual AHI 0.6 over past 30 days on set pressure of 8cmH20. No leak. ESS: 10, FSS: 57  HISTORY (copied from Dr Bonnita HollowSater's previous note)  Rachel Gilbert is 60 y.o. woman with relapsing remitting MS and OSA.    Update 09/29/2019:  She is on Tysabri 300 mg q 4 weeks and tolerates it well.   She has remained JCV Ab negative.   Her MS has been stable.      Her gait is doing well with some stumbles but no falls.   She recently hit her calf and bruised it.   Legs are symmetric and she noted her muscles fatigue easily   She has tingling frequently in legs.  She feel gabapentin 600 mg po qHS has helped sime.  She has urinary frequency but also has hesitancy and she sees urology.  She has had some UTI's .  She was just placed on daily cipro.    She has fatigue many days.    She  sleeps well most nights.  She uses CPAP.  She notes some depression/anxiety despite Lexapro.     She is noting mild cognitive issues, word finding, with reduced short term memory and   She had the duodenal switch bariatric procedure and has lost 80 pounds.   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    She has not had her Covid-19 vaccination.   I let her know I believe the benfits are greater than risks.    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   Additionally, CT angiogram appears to show occlusion of 1 of the MCA branches.     She has been placed on aspirin and then Plavix was added.  She had a TEE but no evidence of a PFO or other source of emboli.  Doppler study showed less than 50% stenosis.  A loop recorder is being considered.    Risk factors:   HTN (on 3 med's), OSA, no DM, no smoking, lipids ok (though Lipitor was added).   .     MS history: She was diagnosed in September 2012 after presenting with diplopia. She was hospitalized and received 5 days of IV steroids  and she had MRI and lumbar puncture. She recalls that the lumbar puncture was not consistent with MS but the MRI was.  She was placed on Gilenya in December 2012. She was able to return to work and the diplopia completely resolved. Then in July 2014 she had another exacerbation with visual disturbance bilaterally, gait changes and cognitive changes.  Reportedly, there was a new lesion in the brainstem and also in the spinal cord at that time. She received 5 more days of steroid.    In early May 2016, she had the onset of several symptoms including worse cognitive problems, worse gait problems and weakness in the hands.   She went to the [facility] on 06/03/2014 and she was evaluated. An admitted and received 3 days of IV Solu-Medrol.The MRI of the brain shows many  white matter lesions. Some are periventricular with an appearance consistent with MS. She also has deep white matter subcortical and juxtacortical foci. Some of these look more consistent with small vessel ischemic changes and most more consistent with multiple sclerosis. She does have new foci in the left vermis of the cerebellum and in the left frontal periventricular region..   She has been po Tysabri since 2016.  Images MRI 09/24/2018:   She has multiple T2/flair hyperintense foci in the periventricular, juxtacortical, subcortical and deep white matter. There are also foci in the pons and left thalamus. Some of the foci in the left frontal lobe likely represent the sequela of the acute stroke noted on the 04/15/2017 MRI. Other foci are more likely to represent demyelination from MS. None of the foci appear to be acute and there are no new lesions compared to the 2019 study.  Also has mild chronic sphenoid sinusitis.   REVIEW OF SYSTEMS: Out of a complete 14 system review of symptoms, the patient complains only of the following symptoms, aphasia, fatigue, anxiety, lower extremity weakness, RLS and all other reviewed systems are negative.    ALLERGIES: Allergies  Allergen Reactions  . Darvon [Propoxyphene] Anaphylaxis and Other (See Comments)    Throat swelling  . Oxycodone Anaphylaxis  . Percocet [Oxycodone-Acetaminophen] Anaphylaxis     HOME MEDICATIONS: Outpatient Medications Prior to Visit  Medication Sig Dispense Refill  . ALPRAZolam (XANAX) 0.5 MG tablet Take 0.5 mg by mouth 2 (two) times daily as needed for anxiety.    [name] aspirin EC 81 MG EC tablet Take 1 tablet (81 mg total) by mouth daily. 30 tablet 0  . atorvastatin (LIPITOR) 80 MG tablet Take 1 tablet (80 mg total) by mouth daily at 6 PM. 30 tablet 0  . Calcium-Vitamin D (CALTRATE 600 PLUS-VIT D PO) Take 4 tablets by mouth daily.    . carvedilol (COREG) 25 MG tablet Take 25 mg by mouth 2 (two) times daily with a meal.    .  clopidogrel (PLAVIX) 75 MG tablet Take 1 tablet (75 mg total) by mouth daily. 90 tablet 3  . escitalopram (LEXAPRO) 20 MG tablet Take 20 mg by mouth daily.    [name] gabapentin (NEURONTIN) 300 MG capsule Take one or two at bedtime (Patient taking differently: Take 600 mg by mouth at bedtime. ) 60 capsule 11  . hydrALAZINE (APRESOLINE) 50 MG tablet Take 50 mg by mouth 2 (two) times daily.    [name] lidocaine-prilocaine (EMLA) cream Apply 1 application topically as needed. 30 g 1  . Multiple Vitamins-Minerals (HAIR SKIN & NAILS ADVANCED PO) Take 1 tablet by mouth daily.     [name]  natalizumab (TYSABRI) 300 MG/15ML injection Inject 15 mLs into the vein every 28 (twenty-eight) days.     . ondansetron (ZOFRAN-ODT) 4 MG disintegrating tablet Take 4 mg by mouth every 8 (eight) hours as needed for nausea or vomiting.    [name] oxybutynin (DITROPAN-XL) 10 MG 24 hr tablet Take 1 tablet (10 mg total) by mouth daily. 30 tablet 11  . potassium chloride (MICRO-K) 10 MEQ CR capsule Take 10 mEq by mouth daily.     [name] UNABLE TO FIND Take 2 tablets by mouth daily. Med Name: Bariatric chewable Advanced Multi EA    . Docusate Sodium (DSS) 100 MG CAPS Stool Softener 100 mg capsule  Take 1 capsule every day by oral route. (Patient not taking: Reported on 03/13/2020)    . methylphenidate (RITALIN) 10 MG tablet Take 2 pills qAM and one pill po 4 hours later 90 tablet 0  . pantoprazole (PROTONIX) 20 MG tablet Take 20 mg by mouth daily.     No facility-administered medications prior to visit.     PAST MEDICAL HISTORY: Past Medical History:  Diagnosis Date  . Complication of anesthesia    hard time waking up   . Hearing loss   . Hypertension   . Kidney stones   . Multiple sclerosis (HCC)   . Sleep apnea    Uses CPAP  . Stroke (HCC) 03/2017  . Vision abnormalities      PAST SURGICAL HISTORY: Past Surgical History:  Procedure Laterality Date  . CESAREAN SECTION  1986  . CHOLECYSTECTOMY  2000   laparoscopic  . IR FLUORO  GUIDED NEEDLE PLC ASPIRATION/INJECTION LOC  04/15/2017  . IR IMAGING GUIDED PORT INSERTION  10/20/2018  . LAPAROSCOPIC GASTRIC RESTRICTIVE DUODENAL PROCEDURE (DUODENAL SWITCH)    . RADIOLOGY WITH ANESTHESIA N/A 04/15/2017   Procedure: LUMBER PUNCTURE MRI SCAN;  Surgeon: Radiologist, Medication, MD;  Location: [facility] OR;  Service: Radiology;  Laterality: N/A;  . TEE WITHOUT CARDIOVERSION N/A 04/16/2017   Procedure: TRANSESOPHAGEAL ECHOCARDIOGRAM (TEE) WITH LOOP;  Surgeon: Wendall Stade, MD;  Location: [facility]COPY;  Service: Cardiovascular;  Laterality: N/A;     FAMILY HISTORY: Family History  Problem Relation Age of Onset  . Fibromyalgia Mother   . Stroke Father   . Diabetes Father   . Stroke Other      SOCIAL HISTORY: Social History   Socioeconomic History  . Marital status: Single    Spouse name: Not on file  . Number of children: Not on file  . Years of education: Not on file  . Highest education level: Not on file  Occupational History  . Occupation: disabled  Tobacco Use  . Smoking status: Never Smoker  . Smokeless tobacco: Never Used  Vaping Use  . Vaping Use: Never used  Substance and Sexual Activity  . Alcohol use: Yes    Comment: Occasional  . Drug use: No  . Sexual activity: Yes    Birth control/protection: None  Other Topics Concern  . Not on file  Social History Narrative  . Not on file   Social Determinants of Health   Financial Resource Strain: Not on file  Food Insecurity: Not on file  Transportation Needs: Not on file  Physical Activity: Not on file  Stress: Not on file  Social Connections: Not on file  Intimate Partner Violence: Not on file      PHYSICAL EXAM  Vitals:   03/29/20 0938  BP: (!) 155/85  Pulse: (!) 52  Weight: 203 lb (92.1  kg)  Height: 5\' 9"  (1.753 m)   Body mass index is 29.98 kg/m.   Generalized: Well developed, in no acute distress  Cardiology: normal rate and rhythm, no murmur auscultated  Respiratory: clear to  auscultation bilaterally    Neurological examination  Mentation: Alert oriented to time, place, history taking. Follows all commands speech and language fluent. No obvious aphasia today.  Cranial nerve II-XII: Pupils were equal round reactive to light. Extraocular movements were full, visual field were full on confrontational test. Facial sensation and strength were normal. Head turning and shoulder shrug  were normal and symmetric. Motor: The motor testing reveals 5 over 5 strength of all 4 extremities.  Sensory: Sensory testing is intact to soft touch on all 4 extremities. No evidence of extinction is noted.  Coordination: Cerebellar testing reveals good finger-nose-finger and reduced left heel-to-shin.  Gait and station: Gait is stable without assistive device, mildly wide and arthritic, unable to  tandem  Reflexes: Deep tendon reflexes are symmetric and slightly increased in bilateral patellar     DIAGNOSTIC DATA (LABS, IMAGING, TESTING) - I reviewed patient records, labs, notes, testing and imaging myself where available.  Lab Results  Component Value Date   WBC 7.8 03/08/2020   HGB 12.2 03/08/2020   HCT 36.9 03/08/2020   MCV 88 03/08/2020   PLT 182 03/08/2020      Component Value Date/Time   NA 143 02/11/2019 0516   NA 143 02/24/2018 1332   K 4.4 02/11/2019 0516   CL 109 02/11/2019 0516   CO2 25 02/11/2019 0516   GLUCOSE 100 (H) 02/11/2019 0516   BUN 17 02/11/2019 0516   BUN 19 02/24/2018 1332   CREATININE 1.25 (H) 02/11/2019 0516   CALCIUM 9.0 02/11/2019 0516   PROT 6.3 (L) 02/11/2019 0516   PROT 5.9 (L) 02/24/2018 1332   ALBUMIN 4.0 02/11/2019 0516   ALBUMIN 4.3 02/24/2018 1332   AST 30 02/11/2019 0516   ALT 57 (H) 02/11/2019 0516   ALKPHOS 139 (H) 02/11/2019 0516   BILITOT 0.9 02/11/2019 0516   BILITOT 0.9 02/24/2018 1332   GFRNONAA 48 (L) 02/11/2019 0516   GFRAA 55 (L) 02/11/2019 0516   Lab Results  Component Value Date   CHOL 156 04/09/2017   HDL 46  04/09/2017   LDLCALC 82 04/09/2017   TRIG 142 04/09/2017   CHOLHDL 3.4 04/09/2017   Lab Results  Component Value Date   HGBA1C 5.5 04/09/2017   No results found for: 04/11/2017 Lab Results  Component Value Date   TSH 1.130 11/06/2016    No flowsheet data found.   No flowsheet data found.   ASSESSMENT AND PLAN  60 y.o. year old female  has a past medical history of Complication of anesthesia, Hearing loss, Hypertension, Kidney stones, Multiple sclerosis (HCC), Sleep apnea, Stroke (HCC) (03/2017), and Vision abnormalities. here with   Relapsing remitting multiple sclerosis (HCC)  High risk medication use  Cerebrovascular accident (CVA) due to thrombosis of precerebral artery (HCC)  Obstructive sleep apnea on CPAP  Other fatigue  Depression with anxiety  Restless leg syndrome  Gait disturbance  Aphasia  Rachel Gilbert is doing well, today. MS symptoms are stable. No new or worsening stroke symptoms. We will continue Tysabri. She is scheduled for 6 week infusions. Will recheck blood work at infusions (has port). She will continue methylphenidate, gabapentin, Plavix as prescribed. PDMP shows appropriate refills. She will continue close follow up with PCP. She will keep a close eye on BP at  home. Healthy lifestyle habits advised. She will follow up with Dr Epimenio Foot in 6 months, sooner if needed.    No orders of the defined types were placed in this encounter.    Meds ordered this encounter  Medications  . methylphenidate (RITALIN) 10 MG tablet    Sig: Take 2 pills qAM and one pill po 4 hours later    Dispense:  90 tablet    Refill:  0    Order Specific Question:   Supervising Provider    Answer:   Anson Fret [[id number]]      I spent 30 minutes of face-to-face and non-face-to-face time with patient.  This included previsit chart review, lab review, study review, order entry, electronic health record documentation, patient education.    Shawnie Dapper, MSN, FNP-C  03/29/2020, 10:31 AM  [facility] [address] [place], [place] [address] [phone]

## 2020-04-02 ENCOUNTER — Telehealth: Payer: Self-pay | Admitting: Neurology

## 2020-04-02 NOTE — Telephone Encounter (Signed)
I called and LMVM for pt that returned call.  Seen 02-28-19 and 09-29-19 last year by Dr. Epimenio Foot.    ? How many lesions (pt asking).  I do not see a #.  Do you have idea?

## 2020-04-02 NOTE — Telephone Encounter (Signed)
Pt. returned RN's call & is requesting a call back.

## 2020-04-02 NOTE — Telephone Encounter (Signed)
There are dozens of lesions in the brain.  Additionally there is the stroke on the left side of the brain.  What is most important is that the last MRI did not show anything new compared to the MRI performed a year previously.

## 2020-04-02 NOTE — Telephone Encounter (Signed)
Pt called wanting to know how many times she has been to see the provider last year and how many lesions she has. Please advise.

## 2020-04-02 NOTE — Telephone Encounter (Signed)
Called pt back. Gave her OV dates of when she was seen starting 02/2018 to present. Also relayed Dr. Bonnita Hollow message. She verbalized understanding and appreciation. She needed this info for social security disability paperwork.

## 2020-04-06 DIAGNOSIS — G2581 Restless legs syndrome: Secondary | ICD-10-CM | POA: Diagnosis not present

## 2020-04-06 DIAGNOSIS — R69 Illness, unspecified: Secondary | ICD-10-CM | POA: Diagnosis not present

## 2020-04-06 DIAGNOSIS — R269 Unspecified abnormalities of gait and mobility: Secondary | ICD-10-CM | POA: Diagnosis not present

## 2020-04-06 DIAGNOSIS — G35 Multiple sclerosis: Secondary | ICD-10-CM | POA: Diagnosis not present

## 2020-04-19 DIAGNOSIS — G35 Multiple sclerosis: Secondary | ICD-10-CM | POA: Diagnosis not present

## 2020-04-26 DIAGNOSIS — G4733 Obstructive sleep apnea (adult) (pediatric): Secondary | ICD-10-CM | POA: Diagnosis not present

## 2020-04-26 DIAGNOSIS — I10 Essential (primary) hypertension: Secondary | ICD-10-CM | POA: Diagnosis not present

## 2020-05-08 DIAGNOSIS — T730XXA Starvation, initial encounter: Secondary | ICD-10-CM | POA: Diagnosis not present

## 2020-05-08 DIAGNOSIS — K9089 Other intestinal malabsorption: Secondary | ICD-10-CM | POA: Diagnosis not present

## 2020-05-08 DIAGNOSIS — Z6829 Body mass index (BMI) 29.0-29.9, adult: Secondary | ICD-10-CM | POA: Diagnosis not present

## 2020-05-08 DIAGNOSIS — Z9884 Bariatric surgery status: Secondary | ICD-10-CM | POA: Diagnosis not present

## 2020-05-08 DIAGNOSIS — R635 Abnormal weight gain: Secondary | ICD-10-CM | POA: Diagnosis not present

## 2020-05-08 DIAGNOSIS — R7302 Impaired glucose tolerance (oral): Secondary | ICD-10-CM | POA: Diagnosis not present

## 2020-05-08 DIAGNOSIS — E559 Vitamin D deficiency, unspecified: Secondary | ICD-10-CM | POA: Diagnosis not present

## 2020-05-09 ENCOUNTER — Ambulatory Visit: Payer: Self-pay | Admitting: Urology

## 2020-05-11 DIAGNOSIS — G35 Multiple sclerosis: Secondary | ICD-10-CM | POA: Diagnosis not present

## 2020-05-11 DIAGNOSIS — R269 Unspecified abnormalities of gait and mobility: Secondary | ICD-10-CM | POA: Diagnosis not present

## 2020-05-11 DIAGNOSIS — R69 Illness, unspecified: Secondary | ICD-10-CM | POA: Diagnosis not present

## 2020-05-11 DIAGNOSIS — G2581 Restless legs syndrome: Secondary | ICD-10-CM | POA: Diagnosis not present

## 2020-05-26 DIAGNOSIS — I10 Essential (primary) hypertension: Secondary | ICD-10-CM | POA: Diagnosis not present

## 2020-05-26 DIAGNOSIS — G4733 Obstructive sleep apnea (adult) (pediatric): Secondary | ICD-10-CM | POA: Diagnosis not present

## 2020-05-31 ENCOUNTER — Other Ambulatory Visit: Payer: Self-pay | Admitting: *Deleted

## 2020-05-31 DIAGNOSIS — Z95828 Presence of other vascular implants and grafts: Secondary | ICD-10-CM

## 2020-05-31 DIAGNOSIS — G35 Multiple sclerosis: Secondary | ICD-10-CM | POA: Diagnosis not present

## 2020-05-31 MED ORDER — LIDOCAINE-PRILOCAINE 2.5-2.5 % EX CREA: 1.0000 "application " | TOPICAL_CREAM | CUTANEOUS | 1 refills | Status: DC | PRN

## 2020-06-12 DIAGNOSIS — I10 Essential (primary) hypertension: Secondary | ICD-10-CM | POA: Diagnosis not present

## 2020-06-12 DIAGNOSIS — R69 Illness, unspecified: Secondary | ICD-10-CM | POA: Diagnosis not present

## 2020-06-12 DIAGNOSIS — G35 Multiple sclerosis: Secondary | ICD-10-CM | POA: Diagnosis not present

## 2020-06-12 DIAGNOSIS — R12 Heartburn: Secondary | ICD-10-CM | POA: Diagnosis not present

## 2020-06-26 DIAGNOSIS — I10 Essential (primary) hypertension: Secondary | ICD-10-CM | POA: Diagnosis not present

## 2020-06-26 DIAGNOSIS — G4733 Obstructive sleep apnea (adult) (pediatric): Secondary | ICD-10-CM | POA: Diagnosis not present

## 2020-07-12 DIAGNOSIS — G35 Multiple sclerosis: Secondary | ICD-10-CM | POA: Diagnosis not present

## 2020-07-17 ENCOUNTER — Telehealth: Payer: Self-pay | Admitting: *Deleted

## 2020-07-17 NOTE — Telephone Encounter (Signed)
Faxed completed/signed Tysabri pt status report and reauth questionnaire to [employer] at [phone]. Received confirmation.    Received fax notification back that pt is re-authorized from 07/16/20-01/23/21. Pt enrollment number: [id number]. Account: [employer]. Site Caffie Pinto: [id number].

## 2020-07-19 DIAGNOSIS — I10 Essential (primary) hypertension: Secondary | ICD-10-CM | POA: Diagnosis not present

## 2020-07-19 DIAGNOSIS — G4733 Obstructive sleep apnea (adult) (pediatric): Secondary | ICD-10-CM | POA: Diagnosis not present

## 2020-07-23 DIAGNOSIS — R12 Heartburn: Secondary | ICD-10-CM | POA: Diagnosis not present

## 2020-07-23 DIAGNOSIS — R69 Illness, unspecified: Secondary | ICD-10-CM | POA: Diagnosis not present

## 2020-07-23 DIAGNOSIS — G35 Multiple sclerosis: Secondary | ICD-10-CM | POA: Diagnosis not present

## 2020-07-23 DIAGNOSIS — I10 Essential (primary) hypertension: Secondary | ICD-10-CM | POA: Diagnosis not present

## 2020-08-06 DIAGNOSIS — H5213 Myopia, bilateral: Secondary | ICD-10-CM | POA: Diagnosis not present

## 2020-08-07 DIAGNOSIS — H35033 Hypertensive retinopathy, bilateral: Secondary | ICD-10-CM | POA: Diagnosis not present

## 2020-08-18 DIAGNOSIS — G4733 Obstructive sleep apnea (adult) (pediatric): Secondary | ICD-10-CM | POA: Diagnosis not present

## 2020-08-18 DIAGNOSIS — I10 Essential (primary) hypertension: Secondary | ICD-10-CM | POA: Diagnosis not present

## 2020-08-22 DIAGNOSIS — R69 Illness, unspecified: Secondary | ICD-10-CM | POA: Diagnosis not present

## 2020-08-22 DIAGNOSIS — R12 Heartburn: Secondary | ICD-10-CM | POA: Diagnosis not present

## 2020-08-22 DIAGNOSIS — I10 Essential (primary) hypertension: Secondary | ICD-10-CM | POA: Diagnosis not present

## 2020-08-22 DIAGNOSIS — G35 Multiple sclerosis: Secondary | ICD-10-CM | POA: Diagnosis not present

## 2020-08-23 DIAGNOSIS — G35 Multiple sclerosis: Secondary | ICD-10-CM | POA: Diagnosis not present

## 2020-09-02 DIAGNOSIS — I69320 Aphasia following cerebral infarction: Secondary | ICD-10-CM | POA: Diagnosis not present

## 2020-09-02 DIAGNOSIS — L0390 Cellulitis, unspecified: Secondary | ICD-10-CM | POA: Diagnosis not present

## 2020-09-03 DIAGNOSIS — L0390 Cellulitis, unspecified: Secondary | ICD-10-CM | POA: Diagnosis not present

## 2020-09-03 DIAGNOSIS — B372 Candidiasis of skin and nail: Secondary | ICD-10-CM | POA: Diagnosis not present

## 2020-09-11 DIAGNOSIS — R12 Heartburn: Secondary | ICD-10-CM | POA: Diagnosis not present

## 2020-09-11 DIAGNOSIS — R69 Illness, unspecified: Secondary | ICD-10-CM | POA: Diagnosis not present

## 2020-09-11 DIAGNOSIS — G35 Multiple sclerosis: Secondary | ICD-10-CM | POA: Diagnosis not present

## 2020-09-11 DIAGNOSIS — I10 Essential (primary) hypertension: Secondary | ICD-10-CM | POA: Diagnosis not present

## 2020-09-14 ENCOUNTER — Other Ambulatory Visit: Payer: Self-pay

## 2020-09-14 ENCOUNTER — Ambulatory Visit (LOCAL_COMMUNITY_HEALTH_CENTER): Payer: Self-pay

## 2020-09-14 DIAGNOSIS — Z111 Encounter for screening for respiratory tuberculosis: Secondary | ICD-10-CM

## 2020-09-14 NOTE — Progress Notes (Signed)
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  Did not take TB meds. Has no documentation of this. Reports she has had neg ppd in recent past. Has MS and is currently on Tsyabri. Consult with TB ccordinator M. Dorminy, RN who recommends to place ppd today. Pt to return 09/17/20 for PPDR. Jerel Shepherd, RN

## 2020-09-17 ENCOUNTER — Other Ambulatory Visit: Payer: Self-pay

## 2020-09-17 ENCOUNTER — Ambulatory Visit (LOCAL_COMMUNITY_HEALTH_CENTER): Payer: Self-pay

## 2020-09-17 ENCOUNTER — Ambulatory Visit
Admission: RE | Admit: 2020-09-17 | Discharge: 2020-09-17 | Disposition: A | Discharge status: Home / Self Care | Payer: Medicare HMO | Source: Ambulatory Visit | Attending: Family Medicine | Admitting: Family Medicine

## 2020-09-17 VITALS — Wt 203.5 lb

## 2020-09-17 DIAGNOSIS — R7611 Nonspecific reaction to tuberculin skin test without active tuberculosis: Secondary | ICD-10-CM | POA: Insufficient documentation

## 2020-09-17 DIAGNOSIS — M47814 Spondylosis without myelopathy or radiculopathy, thoracic region: Secondary | ICD-10-CM | POA: Diagnosis not present

## 2020-09-17 NOTE — Progress Notes (Signed)
In Nurse Clinic for PPDR. 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 Epi completed.  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 Says she has had negative ppd's in recent yrs. Works currently for [employer], Private duty services. Has hx of MS and currently on Tysabri. PCP - Bretta Bang at [facility] (fax [phone])  and also sees Dr. Despina Arias (neurologist). ROI's signed. Pt declines HIV and syphilis blood tests today as she has a "port" and lab draws require specialized attention. Referred for Chest Xray and form given and instructions explained about how to obtain. Pt plans to go today or tomorrow. 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 TB coord contact card (M Dorminy,RN) given. Questions answered and reports understanding. RN explained that [facility] RN will contact her with chest xray results and next steps. Jerel Shepherd, RN

## 2020-09-18 ENCOUNTER — Telehealth: Payer: Self-pay

## 2020-09-18 ENCOUNTER — Telehealth: Payer: Self-pay | Admitting: Family Medicine

## 2020-09-18 DIAGNOSIS — I10 Essential (primary) hypertension: Secondary | ICD-10-CM | POA: Diagnosis not present

## 2020-09-18 DIAGNOSIS — G4733 Obstructive sleep apnea (adult) (pediatric): Secondary | ICD-10-CM | POA: Diagnosis not present

## 2020-09-18 DIAGNOSIS — Z227 Latent tuberculosis: Secondary | ICD-10-CM

## 2020-09-18 NOTE — Telephone Encounter (Signed)
TC with patient. Informed patient of negative CXR.  Discussed LTBI tx and patient declines at this time.  Will pick up TB employer letter Richmond Campbell, RN

## 2020-09-18 NOTE — Telephone Encounter (Signed)
Called the patient back and advised that after speaking with Dr. 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 She is ok to continue tysabri infusions and follow the advice from the provider following the result. Pt verbalized understanding and was appreciative for the call back.

## 2020-09-18 NOTE — Telephone Encounter (Signed)
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 Pt would like to know if this will cause any problems with her getting her infusions. Please advise.

## 2020-10-04 ENCOUNTER — Other Ambulatory Visit: Payer: Self-pay | Admitting: Family Medicine

## 2020-10-04 ENCOUNTER — Other Ambulatory Visit: Payer: Self-pay | Admitting: Neurology

## 2020-10-04 ENCOUNTER — Telehealth: Payer: Self-pay | Admitting: *Deleted

## 2020-10-04 ENCOUNTER — Other Ambulatory Visit: Payer: Self-pay

## 2020-10-04 DIAGNOSIS — Z79899 Other long term (current) drug therapy: Secondary | ICD-10-CM | POA: Diagnosis not present

## 2020-10-04 DIAGNOSIS — G35 Multiple sclerosis: Secondary | ICD-10-CM

## 2020-10-04 NOTE — Telephone Encounter (Signed)
Placed [facility] in [facility] lock box for routine lab pick up. Results pending. 

## 2020-10-05 LAB — CBC WITH DIFFERENTIAL/PLATELET
Basophils Absolute: 0 10*3/uL (ref 0.0–0.2)
Basos: 0 %
EOS (ABSOLUTE): 0.3 10*3/uL (ref 0.0–0.4)
Eos: 4 %
Hematocrit: 36.1 % (ref 34.0–46.6)
Hemoglobin: 11.6 g/dL (ref 11.1–15.9)
Immature Grans (Abs): 0 10*3/uL (ref 0.0–0.1)
Immature Granulocytes: 0 %
Lymphocytes Absolute: 2.1 10*3/uL (ref 0.7–3.1)
Lymphs: 30 %
MCH: 28.8 pg (ref 26.6–33.0)
MCHC: 32.1 g/dL (ref 31.5–35.7)
MCV: 90 fL (ref 79–97)
Monocytes Absolute: 0.5 10*3/uL (ref 0.1–0.9)
Monocytes: 8 %
Neutrophils Absolute: 4 10*3/uL (ref 1.4–7.0)
Neutrophils: 58 %
Platelets: 171 10*3/uL (ref 150–450)
RBC: 4.03 x10E6/uL (ref 3.77–5.28)
RDW: 13.5 % (ref 11.7–15.4)
WBC: 6.9 10*3/uL (ref 3.4–10.8)

## 2020-10-09 DIAGNOSIS — R12 Heartburn: Secondary | ICD-10-CM | POA: Diagnosis not present

## 2020-10-09 DIAGNOSIS — G35 Multiple sclerosis: Secondary | ICD-10-CM | POA: Diagnosis not present

## 2020-10-09 DIAGNOSIS — R69 Illness, unspecified: Secondary | ICD-10-CM | POA: Diagnosis not present

## 2020-10-09 DIAGNOSIS — I10 Essential (primary) hypertension: Secondary | ICD-10-CM | POA: Diagnosis not present

## 2020-10-11 ENCOUNTER — Ambulatory Visit: Payer: Medicare HMO | Admitting: Neurology

## 2020-10-11 ENCOUNTER — Other Ambulatory Visit: Payer: Self-pay

## 2020-10-11 ENCOUNTER — Encounter: Payer: Self-pay | Admitting: Neurology

## 2020-10-11 VITALS — BP 134/85 | HR 71 | Ht 69.0 in | Wt 205.5 lb

## 2020-10-11 DIAGNOSIS — R269 Unspecified abnormalities of gait and mobility: Secondary | ICD-10-CM | POA: Diagnosis not present

## 2020-10-11 DIAGNOSIS — R4701 Aphasia: Secondary | ICD-10-CM

## 2020-10-11 DIAGNOSIS — R5383 Other fatigue: Secondary | ICD-10-CM

## 2020-10-11 DIAGNOSIS — Z79899 Other long term (current) drug therapy: Secondary | ICD-10-CM | POA: Diagnosis not present

## 2020-10-11 DIAGNOSIS — G4733 Obstructive sleep apnea (adult) (pediatric): Secondary | ICD-10-CM

## 2020-10-11 DIAGNOSIS — I6300 Cerebral infarction due to thrombosis of unspecified precerebral artery: Secondary | ICD-10-CM

## 2020-10-11 DIAGNOSIS — Z9989 Dependence on other enabling machines and devices: Secondary | ICD-10-CM | POA: Diagnosis not present

## 2020-10-11 DIAGNOSIS — G35 Multiple sclerosis: Secondary | ICD-10-CM

## 2020-10-11 MED ORDER — METHYLPHENIDATE HCL 10 MG PO TABS: ORAL_TABLET | ORAL | 0 refills | Status: DC

## 2020-10-11 NOTE — Progress Notes (Signed)
[facility]  PATIENT: Rachel Gilbert DOB: 06/27/1960  REFERRING DOCTOR OR PCP:  ER  No PCP SOURCE: ER notes, images on PACS, patient  _________________________________   HISTORICAL  CHIEF COMPLAINT:  Chief Complaint  Patient presents with   Follow-up    Rm 2, alone. Here for 6 month MS f/u, on Tysabri.last infusion: 10/04/20 next infusion on 11/15/20 Pt is now having infusion every 6 weeks. Pt had recent blood work done. Pt reports her legs are more tired. Pt needing refill on Ritalin.     HISTORY OF PRESENT ILLNESS:  Rachel Gilbert is 60 y.o. woman with relapsing remitting MS and OSA.    Update 10/11/2020:  She is on Tysabri 300 mg q 6 weeks and tolerates it well.   She was JCV Ab negative until t 02/2020 when JCV Ab was 0.26 with positive reflex   We discussed risk would be 1:2000 or so. We changed the dosing interval to q 6 weeks.  W have a JCV pending from last week.      Her MS has been stable       Her gait is doing well with some stumbles but no falls.   She recently hit her calf and bruised it.   Legs are symmetric and she noted her muscles fatigue easily   She has tingling frequently in legs.  She feel gabapentin 600 mg po qHS has helped sime.  She has urinary frequency but also has hesitancy and she sees urology. She is on oxybutynin.   No recent UTI's .  She is no longer on daily cipro.    She has fatigue many days.    She sleeps well most nights.  She uses CPAP.  Methylphenidate helps fatigue some  She denies current depression/anxiety and is on  Lexapro.   She is noting mild cognitive issues, word finding, with reduced short term memory.  Methylphenidate helps some  She had the duodenal switch bariatric procedure in 2020 and has lost 80 pounds but gained about 10-20 pounds back..  She is trying to do chair yoga and other exercise.    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      She has had the Covid-19 vaccination (x 2 but  notthe booster).   She has not had Covid.   She had a stroke 2019 and had some aphasia  Stroke History: She was hospitalized at [facility] between 04/12/2017 and 04/21/2017 due to aphasia due to a left MCA distribution stroke.   Additionally, CT angiogram appears to show occlusion of 1 of the MCA branches.     She has been placed on aspirin and then Plavix was added.  She had a TEE but no evidence of a PFO or other source of emboli.  Doppler study showed less than 50% stenosis.  A loop recorder is being considered.    Risk factors:   HTN (on 3 med's), OSA, no DM, no smoking, lipids ok (though Lipitor was added).   .     MS history: She was diagnosed in September 2012 after presenting with diplopia. She was hospitalized and received 5 days of IV steroids and she had MRI and lumbar puncture. She recalls that the lumbar puncture was not consistent with MS but the MRI was.  She was placed on Gilenya in December 2012. She was able to return to work and the diplopia completely resolved. Then in July 2014 she had another  exacerbation with visual disturbance bilaterally, gait changes and cognitive changes.  Reportedly, there was a new lesion in the brainstem and also in the spinal cord at that time. She received 5 more days of steroid.    In early May 2016, she had the onset of several symptoms including worse cognitive problems, worse gait problems and weakness in the hands.   She went to the [place] regional emergency room on 06/03/2014 and she was evaluated. An admitted and received 3 days of IV Solu-Medrol.The MRI of the brain shows many white matter lesions. Some are periventricular with an appearance consistent with MS. She also has deep white matter subcortical and juxtacortical foci. Some of these look more consistent with small vessel ischemic changes and most more consistent with multiple sclerosis. She does have new foci in the left vermis of the cerebellum and in the left frontal  periventricular region..   She has been po Tysabri since 2016.  Images MRI 09/24/2018:   She has multiple T2/flair hyperintense foci in the periventricular, juxtacortical, subcortical and deep white matter.  There are also foci in the pons and left thalamus.  Some of the foci in the left frontal lobe likely represent the sequela of the acute stroke noted on the 04/15/2017 MRI.  Other foci are more likely to represent demyelination from MS.  None of the foci appear to be acute and there are no new lesions compared to the 2019 study.  Also has mild chronic sphenoid sinusitis.  MRI of the brain 06/05/2014 showed suspected active demyelinating plaque in the left corpus callosum and left vermis  MRI of the cervical spine 06/05/2014 showed a normal spinal cord.   REVIEW OF SYSTEMS: Constitutional: No fevers, chills, sweats, or change in appetite.   She has fatigue Eyes: No visual changes, double vision, eye pain Ear, nose and throat: No hearing loss, ear pain, nasal congestion, sore throat Cardiovascular: No chest pain, palpitations Respiratory:  No shortness of breath at rest or with exertion.   No wheezes GastrointestinaI: No nausea, vomiting, diarrhea, abdominal pain, fecal incontinence Genitourinary:  No dysuria, urinary retention.   Frequency better on medications. Musculoskeletal:  No neck pain, back pain Integumentary: No rash, pruritus, skin lesions Neurological: as above Psychiatric: No depression at this time.  No anxiety Endocrine: No palpitations, diaphoresis, change in appetite, change in weigh or increased thirst Hematologic/Lymphatic:  No anemia, purpura, petechiae. Allergic/Immunologic: No itchy/runny eyes, nasal congestion, recent allergic reactions, rashes  ALLERGIES: Allergies  Allergen Reactions   Darvon [Propoxyphene] Anaphylaxis and Other (See Comments)    Throat swelling   Oxycodone Anaphylaxis   Percocet [Oxycodone-Acetaminophen] Anaphylaxis    HOME  MEDICATIONS:  Current Outpatient Medications:    ALPRAZolam (XANAX) 0.5 MG tablet, Take 0.5 mg by mouth 2 (two) times daily as needed for anxiety., Disp: , Rfl:    aspirin EC 81 MG EC tablet, Take 1 tablet (81 mg total) by mouth daily., Disp: 30 tablet, Rfl: 0   atorvastatin (LIPITOR) 80 MG tablet, Take 1 tablet (80 mg total) by mouth daily at 6 PM., Disp: 30 tablet, Rfl: 0   Calcium-Vitamin D (CALTRATE 600 PLUS-VIT D PO), Take 4 tablets by mouth daily., Disp: , Rfl:    carvedilol (COREG) 25 MG tablet, Take 25 mg by mouth 2 (two) times daily with a meal., Disp: , Rfl:    clopidogrel (PLAVIX) 75 MG tablet, Take 1 tablet (75 mg total) by mouth daily., Disp: 90 tablet, Rfl: 3   escitalopram (LEXAPRO) 20 MG  tablet, Take 20 mg by mouth daily., Disp: , Rfl:    gabapentin (NEURONTIN) 300 MG capsule, Take one or two at bedtime (Patient taking differently: Take 600 mg by mouth at bedtime.), Disp: 60 capsule, Rfl: 11   hydrALAZINE (APRESOLINE) 50 MG tablet, Take 50 mg by mouth 2 (two) times daily., Disp: , Rfl:    lidocaine-prilocaine (EMLA) cream, Apply 1 application topically as needed., Disp: 30 g, Rfl: 1   Multiple Vitamins-Minerals (HAIR SKIN & NAILS ADVANCED PO), Take 1 tablet by mouth daily. , Disp: , Rfl:    natalizumab (TYSABRI) 300 MG/15ML injection, Inject 15 mLs into the vein every 28 (twenty-eight) days. , Disp: , Rfl:    ondansetron (ZOFRAN-ODT) 4 MG disintegrating tablet, Take 4 mg by mouth every 8 (eight) hours as needed for nausea or vomiting., Disp: , Rfl:    oxybutynin (DITROPAN-XL) 10 MG 24 hr tablet, Take 1 tablet (10 mg total) by mouth daily., Disp: 30 tablet, Rfl: 11   potassium chloride (MICRO-K) 10 MEQ CR capsule, Take 10 mEq by mouth daily. , Disp: , Rfl:    UNABLE TO FIND, Take 2 tablets by mouth daily. Med Name: Bariatric chewable Advanced Multi EA, Disp: , Rfl:    methylphenidate (RITALIN) 10 MG tablet, Take 2 pills qAM and one pill po 4 hours later, Disp: 90 tablet, Rfl:  0  PAST MEDICAL HISTORY: Past Medical History:  Diagnosis Date   Complication of anesthesia    hard time waking up    Hearing loss    Hypertension    Kidney stones    Multiple sclerosis (HCC)    Sleep apnea    Uses CPAP   Stroke (HCC) 03/2017   Vision abnormalities     PAST SURGICAL HISTORY: Past Surgical History:  Procedure Laterality Date   CESAREAN SECTION  1986   CHOLECYSTECTOMY  2000   laparoscopic   IR FLUORO GUIDED NEEDLE PLC ASPIRATION/INJECTION LOC  04/15/2017   IR IMAGING GUIDED PORT INSERTION  10/20/2018   LAPAROSCOPIC GASTRIC RESTRICTIVE DUODENAL PROCEDURE (DUODENAL SWITCH)     RADIOLOGY WITH ANESTHESIA N/A 04/15/2017   Procedure: LUMBER PUNCTURE MRI SCAN;  Surgeon: Radiologist, Medication, MD;  Location: [facility] OR;  Service: Radiology;  Laterality: N/A;   TEE WITHOUT CARDIOVERSION N/A 04/16/2017   Procedure: TRANSESOPHAGEAL ECHOCARDIOGRAM (TEE) WITH LOOP;  Surgeon: Wendall Stade, MD;  Location: [facility] ENDOSCOPY;  Service: Cardiovascular;  Laterality: N/A;    FAMILY HISTORY: Family History  Problem Relation Age of Onset   Fibromyalgia Mother    Stroke Father    Diabetes Father    Stroke Other     SOCIAL HISTORY:  Social History   Socioeconomic History   Marital status: Single    Spouse name: Not on file   Number of children: Not on file   Years of education: Not on file   Highest education level: Not on file  Occupational History   Occupation: disabled  Tobacco Use   Smoking status: Never   Smokeless tobacco: Never  Vaping Use   Vaping Use: Never used  Substance and Sexual Activity   Alcohol use: Not Currently    Comment: Occasional   Drug use: No   Sexual activity: Not Currently    Birth control/protection: None  Other Topics Concern   Not on file  Social History Narrative   Not on file   Social Determinants of Health   Financial Resource Strain: Not on file  Food Insecurity: Not on file  Transportation Needs: Not  on file  Physical  Activity: Not on file  Stress: Not on file  Social Connections: Not on file  Intimate Partner Violence: Not on file     PHYSICAL EXAM  Vitals:   10/11/20 0908  BP: 134/85  Pulse: 71  Weight: 205 lb 8 oz (93.2 kg)  Height: 5\' 9"  (1.753 m)    Body mass index is 30.35 kg/m.   General: The patient is well-developed and well-nourished and in no acute distress  Neurologic Exam  Mental status: The patient is alert and oriented x 3 at the time of the examination.   Good affect today.  She is alert and oriented.     Focus and attention are good today.  No obvious aphasia today.  Cranial nerves: Extraocular movements are full.   Pupils react equally to light and accommodation.  Fairly symmetric color vision facial strength and sensation is normal.  Trapezius strength is normal.  Minimal dysarthria.  Hearing was symmetric.  Motor:  Muscle bulk is normal.  Muscle tone is normal in the arms are mildly increased in the left leg.  Strength is 5/5.except 4+/5 left toe extension.   Sensory: Sensory testing shows intact touch sensation in the arms and legs  Coordination: Finger-nose-finger is performed well.  Heel-to-shin is reduced on the left.  Gait and station: Station is normal.  The gait is mildly wide.  No foot drop.  Tandem gait is poor.    Romberg is negative.   Reflexes: Deep tendon reflexes are symmetric and slightly increased in legs.       DIAGNOSTIC DATA (LABS, IMAGING, TESTING) - I reviewed patient records, labs, notes, testing and imaging myself where available.  Lab Results  Component Value Date   WBC 6.9 10/04/2020   HGB 11.6 10/04/2020   HCT 36.1 10/04/2020   MCV 90 10/04/2020   PLT 171 10/04/2020      Component Value Date/Time   NA 143 02/11/2019 0516   NA 143 02/24/2018 1332   K 4.4 02/11/2019 0516   CL 109 02/11/2019 0516   CO2 25 02/11/2019 0516   GLUCOSE 100 (H) 02/11/2019 0516   BUN 17 02/11/2019 0516   BUN 19 02/24/2018 1332   CREATININE 1.25 (H)  02/11/2019 0516   CALCIUM 9.0 02/11/2019 0516   PROT 6.3 (L) 02/11/2019 0516   PROT 5.9 (L) 02/24/2018 1332   ALBUMIN 4.0 02/11/2019 0516   ALBUMIN 4.3 02/24/2018 1332   AST 30 02/11/2019 0516   ALT 57 (H) 02/11/2019 0516   ALKPHOS 139 (H) 02/11/2019 0516   BILITOT 0.9 02/11/2019 0516   BILITOT 0.9 02/24/2018 1332   GFRNONAA 48 (L) 02/11/2019 0516   GFRAA 55 (L) 02/11/2019 0516      ASSESSMENT AND PLAN    1. Multiple sclerosis (HCC)   2. High risk medication use   3. Obstructive sleep apnea on CPAP   4. Cerebrovascular accident (CVA) due to thrombosis of precerebral artery (HCC)   5. Other fatigue   6. Gait disturbance   7. Aphasia       1.   She will continue Tysabri.   JCV antibody was recently checked and is pending.  Her last JCV antibody was 0.26 but the inhibition assay reflex was positive.  Therefore we switched her to every 6 weeks.  I let her know that if she converts to middle or high positive we will need to consider a different disease modifying therapy.  She feels she has done very well on  Tysabri and is comfortable continuing with her low positive status  2.   Continue plavix plus aspirin for stroke.  CT angiogram had shown moderate stenosis of the right PCA 3.   Stay active and exercise as tolerated.     4.   She is doing well with CPAP and will continue the current settings.  If she is able to keep weight off she may be able to reduce pressures and I will consider doing a home sleep study once weight is stable for at least a few months. 5.   She will return in 6 months for f/u with me or call sooner if problems    Luccas Towell A. Epimenio Foot, MD, PhD, FAAN Certified in Neurology, Clinical Neurophysiology, Sleep Medicine, Pain Medicine and Neuroimaging Director, [facility] at [facility]  [facility] [address] [place], [place] [address] [phone]

## 2020-10-12 DIAGNOSIS — I10 Essential (primary) hypertension: Secondary | ICD-10-CM | POA: Diagnosis not present

## 2020-10-12 DIAGNOSIS — G4733 Obstructive sleep apnea (adult) (pediatric): Secondary | ICD-10-CM | POA: Diagnosis not present

## 2020-10-12 LAB — STRATIFY JCV AB (W/ INDEX) W/ RFLX
Index Value: 0.31 — ABNORMAL HIGH
Stratify JCV (TM) Ab w/Reflex Inhibition: UNDETERMINED — AB

## 2020-10-12 LAB — RFLX STRATIFY JCV (TM) AB INHIBITION: JCV Antibody by Inhibition: POSITIVE

## 2020-10-18 ENCOUNTER — Telehealth: Payer: Self-pay | Admitting: Neurology

## 2020-10-18 NOTE — Telephone Encounter (Signed)
Pt called wanting to know about her Sept 8th lab results. Please advise.

## 2020-10-18 NOTE — Telephone Encounter (Signed)
Called and spoke with pt. Advised JCV from 10/04/20 + , index 0.31 (previously 0.26). Per Dr. Epimenio Foot, ok to stay on current regimen of Tysabri q 6 weeks. She verbalized understanding and appreciation.

## 2020-10-18 NOTE — Telephone Encounter (Signed)
JCV index value is 0.31 (up from 0.26 in Feb)  Antibody- indeterminate Inhibition-showed positive

## 2020-10-18 NOTE — Telephone Encounter (Signed)
Overall the lab work was in normal range. There was no concerns.  Her JCV remains slightly elevated and Dr Epimenio Foot is aware. He states at this time we will continue to monitor and continue current treatment plan. Will notify the patient of this information.

## 2020-10-26 DIAGNOSIS — Z23 Encounter for immunization: Secondary | ICD-10-CM | POA: Diagnosis not present

## 2020-11-15 DIAGNOSIS — G35 Multiple sclerosis: Secondary | ICD-10-CM | POA: Diagnosis not present

## 2020-11-29 DIAGNOSIS — R051 Acute cough: Secondary | ICD-10-CM | POA: Diagnosis not present

## 2020-12-18 DIAGNOSIS — R69 Illness, unspecified: Secondary | ICD-10-CM | POA: Diagnosis not present

## 2020-12-18 DIAGNOSIS — R7302 Impaired glucose tolerance (oral): Secondary | ICD-10-CM | POA: Diagnosis not present

## 2020-12-18 DIAGNOSIS — Z1231 Encounter for screening mammogram for malignant neoplasm of breast: Secondary | ICD-10-CM | POA: Diagnosis not present

## 2020-12-18 DIAGNOSIS — G35 Multiple sclerosis: Secondary | ICD-10-CM | POA: Diagnosis not present

## 2020-12-18 DIAGNOSIS — Z7689 Persons encountering health services in other specified circumstances: Secondary | ICD-10-CM | POA: Diagnosis not present

## 2020-12-18 DIAGNOSIS — E559 Vitamin D deficiency, unspecified: Secondary | ICD-10-CM | POA: Diagnosis not present

## 2020-12-18 DIAGNOSIS — E785 Hyperlipidemia, unspecified: Secondary | ICD-10-CM | POA: Diagnosis not present

## 2020-12-18 DIAGNOSIS — I10 Essential (primary) hypertension: Secondary | ICD-10-CM | POA: Diagnosis not present

## 2020-12-18 DIAGNOSIS — Z1159 Encounter for screening for other viral diseases: Secondary | ICD-10-CM | POA: Diagnosis not present

## 2020-12-18 DIAGNOSIS — Z0000 Encounter for general adult medical examination without abnormal findings: Secondary | ICD-10-CM | POA: Diagnosis not present

## 2020-12-27 DIAGNOSIS — G35 Multiple sclerosis: Secondary | ICD-10-CM | POA: Diagnosis not present

## 2021-01-04 DIAGNOSIS — G4733 Obstructive sleep apnea (adult) (pediatric): Secondary | ICD-10-CM | POA: Diagnosis not present

## 2021-01-04 DIAGNOSIS — I10 Essential (primary) hypertension: Secondary | ICD-10-CM | POA: Diagnosis not present

## 2021-02-04 DIAGNOSIS — G4733 Obstructive sleep apnea (adult) (pediatric): Secondary | ICD-10-CM | POA: Diagnosis not present

## 2021-02-04 DIAGNOSIS — I10 Essential (primary) hypertension: Secondary | ICD-10-CM | POA: Diagnosis not present

## 2021-02-07 DIAGNOSIS — G35 Multiple sclerosis: Secondary | ICD-10-CM | POA: Diagnosis not present

## 2021-03-07 DIAGNOSIS — G4733 Obstructive sleep apnea (adult) (pediatric): Secondary | ICD-10-CM | POA: Diagnosis not present

## 2021-03-07 DIAGNOSIS — I10 Essential (primary) hypertension: Secondary | ICD-10-CM | POA: Diagnosis not present

## 2021-03-13 ENCOUNTER — Telehealth: Payer: Self-pay | Admitting: Surgery

## 2021-03-13 NOTE — Telephone Encounter (Addendum)
Patient's employer wants her to get another TB test. Pt just had TB test and negative CXR 09/17/2020. Employer letter was produced at that time. Pt should not need any additional testing, can get TB screen if necessary when needed.   EPI 09/14/2021 CXR 09/17/2021 Phone call re: letter and results, declined tx at this time 09/18/2021  Jennye Moccasin, MD

## 2021-03-21 DIAGNOSIS — G35 Multiple sclerosis: Secondary | ICD-10-CM | POA: Diagnosis not present

## 2021-04-04 DIAGNOSIS — G4733 Obstructive sleep apnea (adult) (pediatric): Secondary | ICD-10-CM | POA: Diagnosis not present

## 2021-04-04 DIAGNOSIS — I10 Essential (primary) hypertension: Secondary | ICD-10-CM | POA: Diagnosis not present

## 2021-04-17 ENCOUNTER — Other Ambulatory Visit: Payer: Self-pay | Admitting: Neurology

## 2021-04-18 ENCOUNTER — Ambulatory Visit: Payer: Medicare HMO | Admitting: Family Medicine

## 2021-04-18 ENCOUNTER — Telehealth: Payer: Self-pay | Admitting: *Deleted

## 2021-04-18 ENCOUNTER — Encounter: Payer: Self-pay | Admitting: Family Medicine

## 2021-04-18 VITALS — BP 146/86 | HR 64 | Ht 69.0 in | Wt 215.5 lb

## 2021-04-18 DIAGNOSIS — Z79899 Other long term (current) drug therapy: Secondary | ICD-10-CM | POA: Diagnosis not present

## 2021-04-18 DIAGNOSIS — R269 Unspecified abnormalities of gait and mobility: Secondary | ICD-10-CM | POA: Diagnosis not present

## 2021-04-18 DIAGNOSIS — I6300 Cerebral infarction due to thrombosis of unspecified precerebral artery: Secondary | ICD-10-CM

## 2021-04-18 DIAGNOSIS — R5383 Other fatigue: Secondary | ICD-10-CM

## 2021-04-18 DIAGNOSIS — G4733 Obstructive sleep apnea (adult) (pediatric): Secondary | ICD-10-CM

## 2021-04-18 DIAGNOSIS — G35 Multiple sclerosis: Secondary | ICD-10-CM | POA: Diagnosis not present

## 2021-04-18 DIAGNOSIS — R4189 Other symptoms and signs involving cognitive functions and awareness: Secondary | ICD-10-CM

## 2021-04-18 DIAGNOSIS — Z9989 Dependence on other enabling machines and devices: Secondary | ICD-10-CM

## 2021-04-18 DIAGNOSIS — R69 Illness, unspecified: Secondary | ICD-10-CM | POA: Diagnosis not present

## 2021-04-18 DIAGNOSIS — F418 Other specified anxiety disorders: Secondary | ICD-10-CM | POA: Diagnosis not present

## 2021-04-18 DIAGNOSIS — R4701 Aphasia: Secondary | ICD-10-CM | POA: Diagnosis not present

## 2021-04-18 MED ORDER — METHYLPHENIDATE HCL 10 MG PO TABS: ORAL_TABLET | ORAL | 0 refills | Status: DC

## 2021-04-18 NOTE — Patient Instructions (Signed)
Below is our plan: ? ?We will continue current medications. I will update labs, today. I will refill methylphenidate and place referral for speech therapy. Continue working on healthy lifestyle habits. Keep a close eye on the leak from your CPAP mask and try to change the mask out every 30 days.  ? ?Please make sure you are staying well hydrated. I recommend 50-60 ounces daily. Well balanced diet and regular exercise encouraged. Consistent sleep schedule with 6-8 hours recommended.  ? ?Please continue follow up with care team as directed.  ? ?Follow up with Dr Epimenio Foot in 6 months.  ? ?You may receive a survey regarding today's visit. I encourage you to leave honest feed back as I do use this information to improve patient care. Thank you for seeing me today!  ? ? ?

## 2021-04-18 NOTE — Progress Notes (Signed)
? ? ?Chief Complaint  ?Patient presents with  ? Follow-up  ?  Rm 2, alone. Here for 6 month MS f/u, on Tysabri and tolerating well. Pt reports heaviness in legs, but overall doing well.   ? ? ? ?HISTORY OF PRESENT ILLNESS: ? ?04/18/21 ALL:  ?Rachel Gilbert is a 61 y.o. female here today for follow up for RRMS and OSA on CPAP. She continues Tysabri. Labs have been stable with exception of low positive JCV (0.31).  Infusions now at Q6W intervals.  MRI brain 09/2019 stable. Multiple chronic microhemorrhages in deep gray matter most consistent with hypertensive change.  ? ?She feels MS symptoms are  Gait is stable. She has waxing and waning heaviness of left leg. Using cane sometimes. No falls. Gabapentin PRN helps with RLS.  ? ?She continues methylphenidate 20mg  in mornings and 10mg  in afternoon. This helps with inattention and MS fatigue. She continues to have difficulty with aphasia and cognition since stroke. She needs more time to comprehend what others are saying. She is sleeping well. She does have some anxiety and depression. She feels symptoms are stable on escitalopram.  ? ?She continues Plavix and atorvastatin 80mg . No TIA/stroke symptoms. She is followed closely by PCP. BP has been fairly normal. She reports readings are usually 120-130/70's at home. She presents with BP log that shows normal readings.  ? ?She continues CPAP. Compliance at 100%. Residual AHI 0.8 over past 30 days on set pressure of 8cmH20. Leak 72l/min. She has not changed out mask regularly. Set up date 10/2015 ? ?HISTORY (copied from Dr previous note) ? ?Rachel Gilbert is 61 y.o. woman with relapsing remitting MS and OSA.   ?  ?Update 10/11/2020:  ?She is on Tysabri 300 mg q 6 weeks and tolerates it well.   She was JCV Ab negative until t 02/2020 when JCV Ab was 0.26 with positive reflex   We discussed risk would be 1:2000 or so. We changed the dosing interval to q 6 weeks.  W have a JCV pending from last week.      Her MS has been  stable      ?  ?Her gait is doing well with some stumbles but no falls.   She recently hit her calf and bruised it.   Legs are symmetric and she noted her muscles fatigue easily   She has tingling frequently in legs.  She feel gabapentin 600 mg po qHS has helped sime.  She has urinary frequency but also has hesitancy and she sees urology. She is on oxybutynin.   No recent UTI's .  She is no longer on daily cipro.   ?  ?She has fatigue many days.    She sleeps well most nights.  She uses CPAP.  Methylphenidate helps fatigue some ?  ?She denies current depression/anxiety and is on  Lexapro.   She is noting mild cognitive issues, word finding, with reduced short term memory.  Methylphenidate helps some ?  ?She had the duodenal switch bariatric procedure in 2020 and has lost 80 pounds but gained about 10-20 pounds back..  She is trying to do chair yoga and other exercise.    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     ?  ?She has had the Covid-19 vaccination (x 2 but notthe booster).   She has not had Covid.  ?  ?She had a stroke 2019 and had some aphasia ?  ?Stroke History: ?She was  hospitalized at [facility] between 04/12/2017 and 04/21/2017 due to aphasia due to a left MCA distribution stroke.   Additionally, CT angiogram appears to show occlusion of 1 of the MCA branches.     She has been placed on aspirin and then Plavix was added.  She had a TEE but no evidence of a PFO or other source of emboli.  Doppler study showed less than 50% stenosis.  A loop recorder is being considered.    Risk factors:   HTN (on 3 med's), OSA, no DM, no smoking, lipids ok (though Lipitor was added).   ?.    ?  ?MS history: She was diagnosed in September 2012 after presenting with diplopia. She was hospitalized and received 5 days of IV steroids and she had MRI and lumbar puncture. She recalls that the lumbar puncture was not consistent with MS but the MRI was.  She was placed on Gilenya  in December 2012. She was able to return to work and the diplopia completely resolved. Then in July 2014 she had another exacerbation with visual disturbance bilaterally, gait changes and cognitive changes.  Reportedly, there was a new lesion in the brainstem and also in the spinal cord at that time. She received 5 more days of steroid.    In early May 2016, she had the onset of several symptoms including worse cognitive problems, worse gait problems and weakness in the hands.   She went to the [place] regional emergency room on 06/03/2014 and she was evaluated. An admitted and received 3 days of IV Solu-Medrol.The MRI of the brain shows many white matter lesions. Some are periventricular with an appearance consistent with MS. She also has deep white matter subcortical and juxtacortical foci. Some of these look more consistent with small vessel ischemic changes and most more consistent with multiple sclerosis. She does have new foci in the left vermis of the cerebellum and in the left frontal periventricular region..   She has been po Tysabri since 2016. ?  ?Images ?MRI 09/24/2018:   She has multiple T2/flair hyperintense foci in the periventricular, juxtacortical, subcortical and deep white matter.  There are also foci in the pons and left thalamus.  Some of the foci in the left frontal lobe likely represent the sequela of the acute stroke noted on the 04/15/2017 MRI.  Other foci are more likely to represent demyelination from MS.  None of the foci appear to be acute and there are no new lesions compared to the 2019 study.  Also has mild chronic sphenoid sinusitis. ?  ?MRI of the brain 06/05/2014 showed suspected active demyelinating plaque in the left corpus callosum and left vermis ?  ?MRI of the cervical spine 06/05/2014 showed a normal spinal cord. ?  ? ?REVIEW OF SYSTEMS: Out of a complete 14 system review of symptoms, the patient complains only of the following symptoms, aphasia, fatigue, anxiety, lower extremity  weakness/heaviness, RLS and all other reviewed systems are negative. ? ? ? ?ALLERGIES: ?Allergies  ?Allergen Reactions  ? Darvon [Propoxyphene] Anaphylaxis and Other (See Comments)  ?  Throat swelling  ? Oxycodone Anaphylaxis  ? Percocet [Oxycodone-Acetaminophen] Anaphylaxis  ? ? ? ?HOME MEDICATIONS: ?Outpatient Medications Prior to Visit  ?Medication Sig Dispense Refill  ? ALPRAZolam (XANAX) 0.5 MG tablet Take 0.5 mg by mouth 2 (two) times daily as needed for anxiety.    ? aspirin EC 81 MG EC tablet Take 1 tablet (81 mg total) by mouth daily. 30 tablet 0  ?  atorvastatin (LIPITOR) 80 MG tablet Take 1 tablet (80 mg total) by mouth daily at 6 PM. 30 tablet 0  ? Calcium-Vitamin D (CALTRATE 600 PLUS-VIT D PO) Take 4 tablets by mouth daily.    ? carvedilol (COREG) 25 MG tablet Take 25 mg by mouth 2 (two) times daily with a meal.    ? clopidogrel (PLAVIX) 75 MG tablet Take 1 tablet by mouth once daily 90 tablet 0  ? escitalopram (LEXAPRO) 20 MG tablet Take 20 mg by mouth daily.    ? gabapentin (NEURONTIN) 300 MG capsule Take one or two at bedtime (Patient taking differently: Take 600 mg by mouth at bedtime.) 60 capsule 11  ? hydrALAZINE (APRESOLINE) 50 MG tablet Take 50 mg by mouth 2 (two) times daily.    ? lidocaine-prilocaine (EMLA) cream Apply 1 application topically as needed. 30 g 1  ? Multiple Vitamins-Minerals (HAIR SKIN & NAILS ADVANCED PO) Take 1 tablet by mouth daily.     ? natalizumab (TYSABRI) 300 MG/15ML injection Inject 15 mLs into the vein every 28 (twenty-eight) days.     ? ondansetron (ZOFRAN-ODT) 4 MG disintegrating tablet Take 4 mg by mouth every 8 (eight) hours as needed for nausea or vomiting.    ? oxybutynin (DITROPAN-XL) 10 MG 24 hr tablet Take 1 tablet (10 mg total) by mouth daily. (Patient taking differently: Take 10 mg by mouth as needed.) 30 tablet 11  ? potassium chloride (MICRO-K) 10 MEQ CR capsule Take 10 mEq by mouth daily.     ? UNABLE TO FIND Take 2 tablets by mouth daily. Med Name:  Bariatric chewable Advanced Multi EA    ? methylphenidate (RITALIN) 10 MG tablet Take 2 pills qAM and one pill po 4 hours later 90 tablet 0  ? ?No facility-administered medications prior to visit.  ? ? ? ?PAST ME

## 2021-04-18 NOTE — Progress Notes (Signed)
CM sent to [facility] for new order ?

## 2021-04-18 NOTE — Telephone Encounter (Signed)
Placed [facility] in [facility] lock box for routine lab pick up. Results pending. 

## 2021-04-19 LAB — CBC WITH DIFFERENTIAL/PLATELET
Basophils Absolute: 0.1 10*3/uL (ref 0.0–0.2)
Basos: 1 %
EOS (ABSOLUTE): 0.3 10*3/uL (ref 0.0–0.4)
Eos: 3 %
Hematocrit: 41 % (ref 34.0–46.6)
Hemoglobin: 13.1 g/dL (ref 11.1–15.9)
Immature Grans (Abs): 0.1 10*3/uL (ref 0.0–0.1)
Immature Granulocytes: 1 %
Lymphocytes Absolute: 2.5 10*3/uL (ref 0.7–3.1)
Lymphs: 31 %
MCH: 28.9 pg (ref 26.6–33.0)
MCHC: 32 g/dL (ref 31.5–35.7)
MCV: 90 fL (ref 79–97)
Monocytes Absolute: 0.6 10*3/uL (ref 0.1–0.9)
Monocytes: 8 %
Neutrophils Absolute: 4.7 10*3/uL (ref 1.4–7.0)
Neutrophils: 56 %
Platelets: 205 10*3/uL (ref 150–450)
RBC: 4.54 x10E6/uL (ref 3.77–5.28)
RDW: 13.8 % (ref 11.7–15.4)
WBC: 8.1 10*3/uL (ref 3.4–10.8)

## 2021-04-22 ENCOUNTER — Telehealth: Payer: Self-pay | Admitting: Neurology

## 2021-04-22 NOTE — Telephone Encounter (Signed)
Called the pt to advise of the normal lab findings. Pt states that she had also called about getting a handicap placcard renewed. Advised I would take a look at that and get that placed into the mail for her. Pt verbalized understanding. ?Pt had no questions at this time but was encouraged to call back if questions arise. ? ?

## 2021-04-22 NOTE — Telephone Encounter (Signed)
-----   Message from Shawnie Dapper, NP sent at 04/22/2021  7:08 AM EDT ----- ?Labs are normal  ?

## 2021-04-25 NOTE — Telephone Encounter (Signed)
JCV drawn on 04/18/21 indeterminate, index: 0.20. Inhibition assay: positive. Gave to MD to review. ?

## 2021-04-30 ENCOUNTER — Ambulatory Visit: Payer: Medicare HMO | Attending: Family Medicine | Admitting: Speech Pathology

## 2021-04-30 DIAGNOSIS — G35 Multiple sclerosis: Secondary | ICD-10-CM | POA: Diagnosis not present

## 2021-04-30 DIAGNOSIS — I6300 Cerebral infarction due to thrombosis of unspecified precerebral artery: Secondary | ICD-10-CM | POA: Diagnosis not present

## 2021-04-30 DIAGNOSIS — R41841 Cognitive communication deficit: Secondary | ICD-10-CM | POA: Diagnosis not present

## 2021-05-02 DIAGNOSIS — G35 Multiple sclerosis: Secondary | ICD-10-CM | POA: Diagnosis not present

## 2021-05-02 NOTE — Therapy (Addendum)
[facility] ?[place], [place], [address] ?Phone: [phone]   Fax:  [phone] ? ?Speech Language Pathology Evaluation ? ?Patient Details  ?Name: Rachel Gilbert ?MRN: [id number] ?Date of Birth: [birth year] ?Referring Provider (SLP): Amy Lomax ? ? ?Encounter Date: 04/30/2021 ? ? End of Session - 05/02/21 0757   ? ? Visit Number 1   ? Number of Visits 25   ? Date for SLP Re-Evaluation 07/23/21   ? Authorization Type [employer]na Medicare HMO/PPO   ? Authorization Time Period 04/29/2021 thru 07/23/2021   ? Authorization - Visit Number 1   ? Progress Note Due on Visit 10   ? SLP Start Time [phone]   ? SLP Stop Time  0900   ? SLP Time Calculation (min) 55 min   ? Activity Tolerance Patient tolerated treatment well;Other (comment)   tearful, emotional  ? ?  ?  ? ?  ? ? ?Past Medical History:  ?Diagnosis Date  ? Complication of anesthesia   ? hard time waking up   ? Hearing loss   ? Hypertension   ? Kidney stones   ? Multiple sclerosis ([place])   ? Sleep apnea   ? Uses CPAP  ? Stroke [facility]) 03/2017  ? Vision abnormalities   ? ? ?Past Surgical History:  ?Procedure Laterality Date  ? [place]  ? CHOLECYSTECTOMY  2000  ? laparoscopic  ? IR FLUORO GUIDED NEEDLE PLC ASPIRATION/INJECTION LOC  04/15/2017  ? IR IMAGING GUIDED PORT INSERTION  10/20/2018  ? LAPAROSCOPIC GASTRIC RESTRICTIVE DUODENAL PROCEDURE (DUODENAL SWITCH)    ? RADIOLOGY WITH ANESTHESIA N/A 04/15/2017  ? Procedure: LUMBER PUNCTURE MRI SCAN;  Surgeon: Radiologist, Medication, MD;  Location: [facility];  Service: Radiology;  Laterality: N/A;  ? TEE WITHOUT CARDIOVERSION N/A 04/16/2017  ? Procedure: TRANSESOPHAGEAL ECHOCARDIOGRAM (TEE) WITH LOOP;  Surgeon: Josue Hector, MD;  Location: [facility];  Service: Cardiovascular;  Laterality: N/A;  ? ? ?There were no vitals filed for this visit. ? ? Subjective Assessment - 05/01/21 1536   ? ? Subjective pt tearful throughout the evaluation   ? Currently in Pain?  No/denies   ? ?  ?  ? ?  ? ? ? ? ? SLP Evaluation [facility] - 05/01/21 1536   ? ?  ? SLP Visit Information  ? SLP Received On 04/30/21   ? Referring Provider (SLP) Amy Lomax   ? Onset Date 04/18/2021   ? Medical Diagnosis MS and late effect L MCA CVA   ?  ? General Information  ? HPI Pt is a 61 year old female who was referred for a cognitive linguistic evaluation by her neurologist Amy Lomax. 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 Most recent MRI without contrast on 10/15/2019 revealed chronic left frontal MCA distribution stroke.  There are T2 hyperintense foci in the pons, left middle cerebellar peduncle, cerebellar hemispheres and in the periventricular, juxtacortical and deep white matter of both hemispheres.  Some periventricular foci are radially oriented to the ventricles.  Many of the foci have a nonspecific appearance that could represent either demyelination or chronic microvascular ischemic change.  Some of the foci have an appearance more typical of lacunar infarction..  No new lesions compared to the MRI dated 09/24/2018 diffusion weighted images are normal.  Susceptibility weighted images show chronic microhemorrhages in the basal ganglia,  thalamus, pons and to lesser extent in the subcortical white matter.   ? Behavioral/Cognition tearful, anxious, emotional   ? Mobility Status ambulatory   ?  ? Balance Screen  ? Has the patient fallen in the past 6 months No   ? Has the patient had a decrease in activity level because of a fear of falling?  No   ? Is the patient reluctant to leave their home because of a fear of falling?  No   ?  ? Prior Functional Status  ? Cognitive/Linguistic Baseline Information not available   ?  Lives With Alone   ? Vocation Part time employment   ?  ? Cognition  ? Overall Cognitive Status No family/caregiver present to determine baseline cognitive functioning   ?  Memory Impaired   ? Memory Impairment Storage deficit;Retrieval deficit;Decreased short term memory   ? Decreased Short Term Memory Verbal complex;Functional complex   ? Awareness Appears intact   ? Problem Solving Appears intact   ?  ? Auditory Comprehension  ? Overall Auditory Comprehension Appears within functional limits for tasks assessed   ?  ? Visual Recognition/Discrimination  ? Discrimination Within Function Limits   ?  ? Reading Comprehension  ? Reading Status Within funtional limits   ?  ? Expression  ? Primary Mode of Expression Verbal   ?  ? Verbal Expression  ? Overall Verbal Expression Impaired   ? Initiation No impairment   ? Automatic Speech Name;Social Response;Month of year;Day of week   ? Level of Generative/Spontaneous Verbalization Sentence   ? Repetition Impaired   ? Level of Impairment Phrase level;Sentence level   ? Naming Impairment   ? Responsive 76-100% accurate   ? Confrontation 75-100% accurate   ? Convergent Not tested   ? Divergent 50-74% accurate   ? Pragmatics Impairment   ? Impairments Abnormal affect;Dysprosody;Monotone   ? Non-Verbal Means of Communication Not applicable   ?  ? Written Expression  ? Dominant Hand Right   ? Written Expression Within Functional Limits   ?  ? Oral Motor/Sensory Function  ? Overall Oral Motor/Sensory Function Appears within functional limits for tasks assessed   ?  ? Motor Speech  ? Overall Motor Speech Impaired   ? Respiration Impaired   ? Level of Impairment Sentence   ? Phonation Low vocal intensity   ? Resonance Within functional limits   ? Articulation Impaired   ? Level of Impairment Phrase   ? Intelligibility Intelligibility reduced   ? Word 75-100% accurate   ? Phrase 75-100% accurate   ? Sentence 50-74% accurate   ? Conversation 50-74% accurate   ? Motor Planning Impaired   ? Level of Impairment Phrase   ? Motor Speech Errors Aware;Consistent   ? Phonation WFL   ?  ? Standardized Assessments  ? Standardized Assessments  Western Aphasia  [place] revised   ? ?  ?  ? ?  ? ? ? ? ? ? SLP Education - 05/02/21 0757   ? ? Education Details results of this evaluation, ST POC   ? Person(s) Educated Patient   ? Methods Explanation;Demonstration;Verbal cues   ? Comprehension Verbalized understanding;Need further instruction   ? ?  ?  ? ?  ? ? ? ? SLP Short Term Goals - 05/07/21 1256   ? ?  ? SLP SHORT TERM GOAL #1  ? Title With minimal cues, pt will generate at least 2 synonyms to increase ability to alternate words during moments  of word finding.   ? Baseline new goal   ? Time 10   ? Period --   sessions  ? Status New   ?  ? SLP SHORT TERM GOAL #2  ? Title With minimal assistance, pt will generate list of 10 items within complex category across 5 sessions.   ? Baseline new goal - 6 animals   ? Time 10   ? Period --   sessions  ? Status New   ?  ? SLP SHORT TERM GOAL #3  ? Title With minimal assistance, pt will use speech intelligibility strategies to achieve 90% speech intelligibility at the sentence level.   ? Baseline new goal   ? Time 10   ? Period --   sessions  ? Status New   ? ?  ?  ? ?  ? ? ? SLP Long Term Goals - 05/07/21 1306   ? ?  ? SLP LONG TERM GOAL #1  ? Title Pt will use word finding strategies to convey semi-complex information and thoughts.   ? Baseline new goal   ? Time 12   ? Period Weeks   ? Status New   ? Target Date 07/23/21   ?  ? SLP LONG TERM GOAL #2  ? Title Pt will utilize speech intelligibility strategies to achieve > 95% speech intelligibility at the conversation level.   ? Baseline new goal - 75% intelligibility   ? Time 12   ? Period Weeks   ? Status New   ? Target Date 07/23/21   ? ?  ?  ? ? ? ? Plan - 05/02/21 0758   ? ? 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 She requested referral to Outpatient ST as she feels her symptoms are acutely worse. Pt lives alone and is a private caregiver for an elderly lady, she drives and manages her own  money/medications.      ? ?Pt does have a history of anxiety and depression and per chart she reports this is being managed well. However, pt arrived to session in tears and cried for the vast majority of the session. S

## 2021-05-05 DIAGNOSIS — I10 Essential (primary) hypertension: Secondary | ICD-10-CM | POA: Diagnosis not present

## 2021-05-05 DIAGNOSIS — G4733 Obstructive sleep apnea (adult) (pediatric): Secondary | ICD-10-CM | POA: Diagnosis not present

## 2021-05-06 ENCOUNTER — Ambulatory Visit: Payer: Medicare HMO | Admitting: Speech Pathology

## 2021-05-06 DIAGNOSIS — I6300 Cerebral infarction due to thrombosis of unspecified precerebral artery: Secondary | ICD-10-CM | POA: Diagnosis not present

## 2021-05-06 DIAGNOSIS — G35 Multiple sclerosis: Secondary | ICD-10-CM | POA: Diagnosis not present

## 2021-05-06 DIAGNOSIS — R41841 Cognitive communication deficit: Secondary | ICD-10-CM

## 2021-05-06 NOTE — Addendum Note (Signed)
Addended by: Stormy Fabian B on: 05/06/2021 07:50 AM ? ? Modules accepted: Orders ? ?

## 2021-05-07 ENCOUNTER — Encounter: Payer: Medicare HMO | Admitting: Speech Pathology

## 2021-05-08 ENCOUNTER — Ambulatory Visit: Payer: Medicare HMO | Admitting: Speech Pathology

## 2021-05-08 DIAGNOSIS — G35 Multiple sclerosis: Secondary | ICD-10-CM | POA: Diagnosis not present

## 2021-05-08 DIAGNOSIS — I6300 Cerebral infarction due to thrombosis of unspecified precerebral artery: Secondary | ICD-10-CM | POA: Diagnosis not present

## 2021-05-08 DIAGNOSIS — R41841 Cognitive communication deficit: Secondary | ICD-10-CM | POA: Diagnosis not present

## 2021-05-08 NOTE — Therapy (Signed)
[place] [facility] ?[address] ?[place], [place], [address] ?Phone: [phone]   Fax:  [phone] ? ?Speech Language Pathology Treatment ? ?Patient Details  ?Name: Rachel Gilbert ?MRN: [id number] ?Date of Birth: 08/05/1960 ?Referring Provider (SLP): Amy Lomax ? ? ?Encounter Date: 05/06/2021 ? ? End of Session - 05/08/21 1230   ? ? Visit Number 2   ? Number of Visits 25   ? Date for SLP Re-Evaluation 07/23/21   ? Authorization Type [employer]na Medicare HMO/PPO   ? Authorization Time Period 04/29/2021 thru 07/23/2021   ? Authorization - Visit Number 2   ? Progress Note Due on Visit 10   ? SLP Start Time [phone]   ? SLP Stop Time  0900   ? SLP Time Calculation (min) 55 min   ? Activity Tolerance Patient tolerated treatment well   ? ?  ?  ? ?  ? ? ?Past Medical History:  ?Diagnosis Date  ? Complication of anesthesia   ? hard time waking up   ? Hearing loss   ? Hypertension   ? Kidney stones   ? Multiple sclerosis (HCC)   ? Sleep apnea   ? Uses CPAP  ? Stroke [facility]) 03/2017  ? Vision abnormalities   ? ? ?Past Surgical History:  ?Procedure Laterality Date  ? CESAREAN SECTION  1986  ? CHOLECYSTECTOMY  2000  ? laparoscopic  ? IR FLUORO GUIDED NEEDLE PLC ASPIRATION/INJECTION LOC  04/15/2017  ? IR IMAGING GUIDED PORT INSERTION  10/20/2018  ? LAPAROSCOPIC GASTRIC RESTRICTIVE DUODENAL PROCEDURE (DUODENAL SWITCH)    ? RADIOLOGY WITH ANESTHESIA N/A 04/15/2017  ? Procedure: LUMBER PUNCTURE MRI SCAN;  Surgeon: Radiologist, Medication, MD;  [facility];  Service: Radiology;  Laterality: N/A;  ? TEE WITHOUT CARDIOVERSION N/A 04/16/2017  ? Procedure: TRANSESOPHAGEAL ECHOCARDIOGRAM (TEE) WITH LOOP;  Surgeon: Wendall Stade, MD;  Location: [facility];  Service: Cardiovascular;  Laterality: N/A;  ? ? ?There were no vitals filed for this visit. ? ? Subjective Assessment - 05/08/21 1229   ? ? Subjective pt eager   ? Currently in Pain? No/denies   ? ?  ?  ? ?  ? ? ? ? ? ? ? ? ADULT SLP TREATMENT -  05/08/21 0001   ? ?  ? Treatment Provided  ? Treatment provided Cognitive-Linquistic   ?  ? Cognitive-Linquistic Treatment  ? Treatment focused on Cognition;Aphasia;Dysarthria;Patient/family/caregiver education   ? Skilled Treatment Skilled treatment session focused on pt's cognitive communication impairment.  ? ?SLP facilitated session by providing the following interventions:     ? ?Memory Impairments:     ? ?MULTIFACTORIAL MEMORY QUESTIONNAIRE (MMQ)   ? ?Administered patient self-reported outcome measure Multifactorial Memory Questionnaire (MMQ). The Multifactorial Memory Questionnaire [facility]) consists of three scales measuring separate aspects of metamemory; Satisfaction, Ability and Strategy.  ? ?Pt's responses are converted to T-Scores with severity levels based on pt's T-Score.      ? ?Pt reports:    ?LOW Memory Satisfaction (T-score: 26)   ?BELOW AVERAGE Memory Ability (T-score: 36)  VERY HIGH use of Memory Strategies (T-score: >76)     ? ?Cognitive Communication:   ?The Neuro-QOL? Item Bank v2.0-Cognition Function-Short Form is an eight-item test designed to measure difficulties with cognitive functioning (e.g., memory, attention and decision making or in the application of such abilities to everyday tasks (e.g., planning, organizing, calculating, remembering and learning).   ? ?Pt's T-scores:  Applied Cognition - General Concerns: Moderate to Significant Difficulty   ?  Tax adviser - Executive Function: Moderate Difficulty     ? ?Word Finding:  SLP introduced strategies with focus on developing synonyms   ? ?  ?  ? ?  ? ? ? SLP Education - 05/08/21 1230   ? ? Education Details word finding strategies   ? Person(s) Educated Patient   ? Methods Explanation;Demonstration;Verbal cues;Handout   ? Comprehension Verbalized understanding;Need further instruction   ? ?  ?  ? ?  ? ? ? SLP Short Term Goals - 05/07/21 1256   ? ?  ? SLP SHORT TERM GOAL #1  ? Title With minimal cues, pt will generate at least 2  synonyms to increase ability to alternate words during moments of word finding.   ? Baseline new goal   ? Time 10   ? Period --   sessions  ? Status New   ?  ? SLP SHORT TERM GOAL #2  ? Title With minimal assistance, pt will generate list of 10 items within complex category across 5 sessions.   ? Baseline new goal - 6 animals   ? Time 10   ? Period --   sessions  ? Status New   ?  ? SLP SHORT TERM GOAL #3  ? Title With minimal assistance, pt will use speech intelligibility strategies to achieve 90% speech intelligibility at the sentence level.   ? Baseline new goal   ? Time 10   ? Period --   sessions  ? Status New   ? ?  ?  ? ?  ? ? ? SLP Long Term Goals - 05/07/21 1306   ? ?  ? SLP LONG TERM GOAL #1  ? Title Pt will use word finding strategies to convey semi-complex information and thoughts.   ? Baseline new goal   ? Time 12   ? Period Weeks   ? Status New   ? Target Date 07/23/21   ?  ? SLP LONG TERM GOAL #2  ? Title Pt will utilize speech intelligibility strategies to achieve > 95% speech intelligibility at the conversation level.   ? Baseline new goal - 75% intelligibility   ? Time 12   ? Period Weeks   ? Status New   ? Target Date 07/23/21   ? ?  ?  ? ?  ? ? ? Plan - 05/08/21 1231   ? ? Clinical Impression Statement Pt continues to be very motivated and eager to participate. In addition to pt's cognitive communication she reports struggling with PBS like symptoms. Skilled ST intervention continues to be required to target pt's mild to moderate impairments to increase her overall functional independence and participation in work as well as community activities.   ? Speech Therapy Frequency 2x / week   ? Duration 12 weeks   ? Treatment/Interventions Language facilitation;Functional tasks;SLP instruction and feedback;Patient/family education;Compensatory strategies;Internal/external aids   ? Potential to Achieve Goals Fair   ? Potential Considerations Severity of impairments;Medical prognosis;Previous level of  function   ? SLP Home Exercise Plan provided, see pt instructions section   ? Consulted and Agree with Plan of Care Patient   ? ?  ?  ? ?  ? ? ?Patient will benefit from skilled therapeutic intervention in order to improve the following deficits and impairments:   ?Cognitive communication deficit ? ?Cerebrovascular accident (CVA) due to thrombosis of precerebral artery (HCC) ? ?Multiple sclerosis exacerbation (HCC) ? ? ? ?Problem List ?Patient Active Problem List  ? Diagnosis Date  Noted  ? 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 Poor venous access 02/28/2019  ? Hearing loss 02/09/2019  ? BMI 29.0-29.9,adult 02/01/2019  ? Restless leg syndrome 08/26/2018  ? Status post bariatric surgery 03/23/2018  ? Preoperative clearance 12/28/2017  ? History of CVA (cerebrovascular accident) 12/23/2017  ? Pedal edema 08/06/2017  ? High risk medication use 06/05/2017  ? Abnormal laboratory test result 06/05/2017  ? Hyperlipidemia 05/25/2017  ? Aphasia 05/07/2017  ? Dysphagia   ? Cerebrovascular accident (CVA) due to thrombosis of precerebral artery (HCC) 04/13/2017  ? TIA (transient ischemic attack) 04/08/2017  ? Attention deficit 11/06/2016  ? Impaired glucose tolerance 10/31/2015  ? Pseudobulbar affect 02/14/2015  ? Insomnia 11/16/2014  ? Other fatigue 07/03/2014  ? Urinary frequency 07/03/2014  ? Gait disturbance 06/14/2014  ? Depression with anxiety 06/14/2014  ? Vitamin D deficiency 06/14/2014  ? Multiple sclerosis exacerbation (HCC) 06/04/2014  ? Essential hypertension 06/04/2014  ? Obesity 06/04/2014  ? Obstructive sleep apnea on CPAP 06/04/2014  ? Gastroesophageal reflux disease without esophagitis 06/04/2014  ? Relapsing remitting multiple sclerosis (HCC) 06/04/2014  ? ?Cannon Arreola B. Dreama Saa, M.S., CCC-SLP, CBIS ?Speech-Language Pathologist ?Rehabilitation Services ?Office [phone] ? ?Rachel Gilbert Dreama Saa, CCC-SLP ?05/08/2021, 12:32 PM ? ?[place] [facility] ?[address] ?[place], [place],  [address] ?Phone: [phone]   Fax:  [phone] ? ? ?Name: Rachel Gilbert ?MRN: [id number] ?Date of Birth: 02-Mar-1960 ? ?

## 2021-05-08 NOTE — Patient Instructions (Signed)
Complete worksheets on synonyms, call insurance about coverage for audiological exam/hearing aids ?

## 2021-05-09 NOTE — Patient Instructions (Signed)
Complete synonym homework, read chapter 1 of book ?

## 2021-05-09 NOTE — Therapy (Signed)
[place] ?[facility] ?[place], [place], [address] ?Phone: [phone]   Fax:  [phone] ? ?Speech Language Pathology Treatment ? ?Patient Details  ?Name: Rachel Gilbert ?MRN: [id number] ?Date of Birth: 11-23-1960 ?Referring Provider (SLP): Amy Lomax ? ? ?Encounter Date: 05/08/2021 ? ? End of Session - 05/09/21 1254   ? ? Visit Number 3   ? Number of Visits 25   ? Date for SLP Re-Evaluation 07/23/21   ? Authorization Type Aetna Medicare HMO/PPO   ? Authorization Time Period 04/29/2021 thru 07/23/2021   ? Authorization - Visit Number 3   ? Progress Note Due on Visit 10   ? SLP Start Time [phone]   ? SLP Stop Time  0900   ? SLP Time Calculation (min) 50 min   ? Activity Tolerance Patient tolerated treatment well   ? ?  ?  ? ?  ? ? ?Past Medical History:  ?Diagnosis Date  ? Complication of anesthesia   ? hard time waking up   ? Hearing loss   ? Hypertension   ? Kidney stones   ? Multiple sclerosis ([place])   ? Sleep apnea   ? Uses CPAP  ? Stroke [facility]) 03/2017  ? Vision abnormalities   ? ? ?Past Surgical History:  ?Procedure Laterality Date  ? [place]  ? CHOLECYSTECTOMY  2000  ? laparoscopic  ? IR FLUORO GUIDED NEEDLE PLC ASPIRATION/INJECTION LOC  04/15/2017  ? IR IMAGING GUIDED PORT INSERTION  10/20/2018  ? LAPAROSCOPIC GASTRIC RESTRICTIVE DUODENAL PROCEDURE (DUODENAL SWITCH)    ? RADIOLOGY WITH ANESTHESIA N/A 04/15/2017  ? Procedure: LUMBER PUNCTURE MRI SCAN;  Surgeon: Radiologist, Medication, MD;  Location: [facility];  Service: Radiology;  Laterality: N/A;  ? TEE WITHOUT CARDIOVERSION N/A 04/16/2017  ? Procedure: TRANSESOPHAGEAL ECHOCARDIOGRAM (TEE) WITH LOOP;  Surgeon: Josue Hector, MD;  Location: [facility];  Service: Cardiovascular;  Laterality: N/A;  ? ? ?There were no vitals filed for this visit. ? ? Subjective Assessment - 05/09/21 1240   ? ? Subjective pt returns with follow-thru on all previously discussed items, reports that she has an  appointment scheduled with audiology on April 25th   ? Currently in Pain? No/denies   ? ?  ?  ? ?  ? ? ? ? ? ? ? ? ADULT SLP TREATMENT - 05/09/21 0001   ? ?  ? Treatment Provided  ? Treatment provided Cognitive-Linquistic   ?  ? Cognitive-Linquistic Treatment  ? Treatment focused on Cognition;Aphasia;Dysarthria;Patient/family/caregiver education   ? Skilled Treatment Skilled treatment session focused on pt's cognitive communication impairment.  ? ?SLP facilitated session by providing the following interventions:      ? ?Speech Intelligibility: - pt with moderate distortion of "r-vowels" which leads to frustration and emotional response; SLP provided skilled education on vowels and the nucleus of the word     ? ?Word finding: SLP provided moderate assistance to generate synonyms independently without having any words to choose synonym from. Pt benefited from SLP generated sentences in which she substituted the appropriate synonyms     ? ?Reading: Pt instructed to use multi-compensatory reading/memory strategies when reading chapter 1 of Palma Holter' book "Every Breath"   ? ?  ?  ? ?  ? ? ? SLP Education - 05/09/21 1253   ? ? Education Details strategies to improve reading comprehension, speech intelligibility strategies   ? Person(s) Educated Patient   ? Methods Explanation;Demonstration;Verbal cues;Handout   ?  Comprehension Verbalized understanding;Returned demonstration;Need further instruction   ? ?  ?  ? ?  ? ? ? SLP Short Term Goals - 05/07/21 1256   ? ?  ? SLP SHORT TERM GOAL #1  ? Title With minimal cues, pt will generate at least 2 synonyms to increase ability to alternate words during moments of word finding.   ? Baseline new goal   ? Time 10   ? Period --   sessions  ? Status New   ?  ? SLP SHORT TERM GOAL #2  ? Title With minimal assistance, pt will generate list of 10 items within complex category across 5 sessions.   ? Baseline new goal - 6 animals   ? Time 10   ? Period --   sessions  ? Status New    ?  ? SLP SHORT TERM GOAL #3  ? Title With minimal assistance, pt will use speech intelligibility strategies to achieve 90% speech intelligibility at the sentence level.   ? Baseline new goal   ? Time 10   ? Period --   sessions  ? Status New   ? ?  ?  ? ?  ? ? ? SLP Long Term Goals - 05/07/21 1306   ? ?  ? SLP LONG TERM GOAL #1  ? Title Pt will use word finding strategies to convey semi-complex information and thoughts.   ? Baseline new goal   ? Time 12   ? Period Weeks   ? Status New   ? Target Date 07/23/21   ?  ? SLP LONG TERM GOAL #2  ? Title Pt will utilize speech intelligibility strategies to achieve > 95% speech intelligibility at the conversation level.   ? Baseline new goal - 75% intelligibility   ? Time 12   ? Period Weeks   ? Status New   ? Target Date 07/23/21   ? ?  ?  ? ?  ? ? ? Plan - 05/09/21 1254   ? ? Clinical Impression Statement Pt continues to be very motivated and eager to participate. Her speech intelligibility continues to evoke an emotional response, "I got mad with myself." To promote increase reading comprehension as well as continued language development, pt and SLP are going to engage in a book club reading together of Palma Holter' Every Breath. Skilled ST intervention continues to be required to target pt's mild to moderate impairments to increase her overall functional independence and participation in work as well as community activities.   ? Speech Therapy Frequency 2x / week   ? Duration 12 weeks   ? Treatment/Interventions Language facilitation;Functional tasks;SLP instruction and feedback;Patient/family education;Compensatory strategies;Internal/external aids   ? Potential to [place]   ? Potential Considerations Severity of impairments;Medical prognosis;Previous level of function   ? SLP Home Exercise Plan provided, see pt instructions section   ? Consulted and Agree with Plan of Care Patient   ? ?  ?  ? ?  ? ? ?Patient will benefit from skilled therapeutic  intervention in order to improve the following deficits and impairments:   ?Cognitive communication deficit ? ?Cerebrovascular accident (CVA) due to thrombosis of precerebral artery ([place]) ? ?Multiple sclerosis exacerbation ([place]) ? ? ? ?Problem List ?Patient Active Problem List  ? Diagnosis Date Noted  ? 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 Poor venous access 02/28/2019  ? Hearing loss 02/09/2019  ? BMI 29.0-29.9,adult 02/01/2019  ? Restless leg syndrome 08/26/2018  ? Status post bariatric  surgery 03/23/2018  ? Preoperative clearance 12/28/2017  ? History of CVA (cerebrovascular accident) 12/23/2017  ? Pedal edema 08/06/2017  ? High risk medication use 06/05/2017  ? Abnormal laboratory test result 06/05/2017  ? Hyperlipidemia 05/25/2017  ? Aphasia 05/07/2017  ? Dysphagia   ? Cerebrovascular accident (CVA) due to thrombosis of precerebral artery ([place]) 04/13/2017  ? TIA (transient ischemic attack) 04/08/2017  ? Attention deficit 11/06/2016  ? Impaired glucose tolerance 10/31/2015  ? Pseudobulbar affect 02/14/2015  ? Insomnia 11/16/2014  ? Other fatigue 07/03/2014  ? Urinary frequency 07/03/2014  ? Gait disturbance 06/14/2014  ? Depression with anxiety 06/14/2014  ? Vitamin D deficiency 06/14/2014  ? Multiple sclerosis exacerbation ([place]) 06/04/2014  ? Essential hypertension 06/04/2014  ? Obesity 06/04/2014  ? Obstructive sleep apnea on CPAP 06/04/2014  ? Gastroesophageal reflux disease without esophagitis 06/04/2014  ? Relapsing remitting multiple sclerosis ([place]) 06/04/2014  ? ?Rachel Gilbert B. Rutherford Nail, M.S., CCC-SLP, CBIS ?Speech-Language Pathologist ?Rehabilitation Services ?Office [phone] ? ?[place], [place] ?05/09/2021, 12:55 PM ? ?[place] ?[facility] ?[place], [place], [address] ?Phone: [phone]   Fax:  [phone] ? ? ?Name: Rachel Gilbert ?MRN: [id number] ?Date of Birth: 06/28/1960 ? ?

## 2021-05-13 ENCOUNTER — Ambulatory Visit: Payer: Medicare HMO | Admitting: Speech Pathology

## 2021-05-16 ENCOUNTER — Ambulatory Visit: Payer: Medicare HMO | Admitting: Speech Pathology

## 2021-05-20 ENCOUNTER — Ambulatory Visit: Payer: Medicare HMO | Admitting: Speech Pathology

## 2021-05-20 DIAGNOSIS — I6300 Cerebral infarction due to thrombosis of unspecified precerebral artery: Secondary | ICD-10-CM | POA: Diagnosis not present

## 2021-05-20 DIAGNOSIS — G35 Multiple sclerosis: Secondary | ICD-10-CM

## 2021-05-20 DIAGNOSIS — R41841 Cognitive communication deficit: Secondary | ICD-10-CM

## 2021-05-20 NOTE — Patient Instructions (Signed)
Read thru page 86 in book and keep in mind the questions from today's session as she reads ?

## 2021-05-20 NOTE — Therapy (Signed)
[place] ?[facility] ?[address] ?[place], [place], [address] ?Phone: [phone]   Fax:  [phone] ? ?Speech Language Pathology Treatment ? ?Patient Details  ?Name: Marg Scardino ?MRN: [id number] ?Date of Birth: October 07, 1960 ?Referring Provider (SLP): Amy Lomax ? ? ?Encounter Date: 05/20/2021 ? ? End of Session - 05/20/21 1316   ? ? Visit Number 4   ? Number of Visits 25   ? Date for SLP Re-Evaluation 07/23/21   ? Authorization Type Aetna Medicare HMO/PPO   ? Authorization Time Period 04/29/2021 thru 07/23/2021   ? Authorization - Visit Number 4   ? Progress Note Due on Visit 10   ? SLP Start Time 0800   ? SLP Stop Time  0900   ? SLP Time Calculation (min) 60 min   ? Activity Tolerance Patient tolerated treatment well   ? ?  ?  ? ?  ? ? ?Past Medical History:  ?Diagnosis Date  ? Complication of anesthesia   ? hard time waking up   ? Hearing loss   ? Hypertension   ? Kidney stones   ? Multiple sclerosis (HCC)   ? Sleep apnea   ? Uses CPAP  ? Stroke [facility]) 03/2017  ? Vision abnormalities   ? ? ?Past Surgical History:  ?Procedure Laterality Date  ? CESAREAN SECTION  1986  ? CHOLECYSTECTOMY  2000  ? laparoscopic  ? IR FLUORO GUIDED NEEDLE PLC ASPIRATION/INJECTION LOC  04/15/2017  ? IR IMAGING GUIDED PORT INSERTION  10/20/2018  ? LAPAROSCOPIC GASTRIC RESTRICTIVE DUODENAL PROCEDURE (DUODENAL SWITCH)    ? RADIOLOGY WITH ANESTHESIA N/A 04/15/2017  ? Procedure: LUMBER PUNCTURE MRI SCAN;  Surgeon: Radiologist, Medication, MD;  [facility];  Service: Radiology;  Laterality: N/A;  ? TEE WITHOUT CARDIOVERSION N/A 04/16/2017  ? Procedure: TRANSESOPHAGEAL ECHOCARDIOGRAM (TEE) WITH LOOP;  Surgeon: Wendall Stade, MD;  [facility]: [facility] ENDOSCOPY;  Service: Cardiovascular;  Laterality: N/A;  ? ? ?There were no vitals filed for this visit. ? ? Subjective Assessment - 05/20/21 1312   ? ? Subjective Pt arrived with sympathy card for SLP   ? Currently in Pain? No/denies   ? ?  ?  ? ?   ? ? ? ? ? ? ? ? ADULT SLP TREATMENT - 05/20/21 0001   ? ?  ? Treatment Provided  ? Treatment provided Cognitive-Linquistic   ?  ? Cognitive-Linquistic Treatment  ? Treatment focused on Cognition;Aphasia;Dysarthria;Patient/family/caregiver education   ? Skilled Treatment Skilled treatment session focused on pt's cognitive communication impairment.  ? ?SLP facilitated session by providing the following interventions:      ? ?Speech Intelligibility: > 90% at the simple sentence level     ? ?Reading and word finding/language organization: Lavella Lemons' book "EVERY BREATH" Pt had book as well as notebook with notes on chapter 1 for improved recall. SLP facilitated recall of information as well as word finding by posing the following questions:  What was your favorite part so far? Tru or Hope? - With more than a reasonable amount of time, pt able to articulate that she identified more with the character "Hope"  Which scene stuck with you the most? Pt able to identify scene with cohesive description, no evidence of word finding, speech intelligibility > 90%  What did you think of the writing?   "Once they make a movie out of it, his writing makes more sense to me"   ? ?  ?  ? ?  ? ? ?  SLP Education - 05/20/21 1316   ? ? Education Details provided with calendar of sessions for May 2023; audiology appt tomorrow   ? Person(s) Educated Patient   ? Methods Explanation;Demonstration;Verbal cues;Handout   ? Comprehension Verbalized understanding;Returned demonstration   ? ?  ?  ? ?  ? ? ? SLP Short Term Goals - 05/07/21 1256   ? ?  ? SLP SHORT TERM GOAL #1  ? Title With minimal cues, pt will generate at least 2 synonyms to increase ability to alternate words during moments of word finding.   ? Baseline new goal   ? Time 10   ? Period --   sessions  ? Status New   ?  ? SLP SHORT TERM GOAL #2  ? Title With minimal assistance, pt will generate list of 10 items within complex category across 5 sessions.   ? Baseline new goal - 6  animals   ? Time 10   ? Period --   sessions  ? Status New   ?  ? SLP SHORT TERM GOAL #3  ? Title With minimal assistance, pt will use speech intelligibility strategies to achieve 90% speech intelligibility at the sentence level.   ? Baseline new goal   ? Time 10   ? Period --   sessions  ? Status New   ? ?  ?  ? ?  ? ? ? SLP Long Term Goals - 05/07/21 1306   ? ?  ? SLP LONG TERM GOAL #1  ? Title Pt will use word finding strategies to convey semi-complex information and thoughts.   ? Baseline new goal   ? Time 12   ? Period Weeks   ? Status New   ? Target Date 07/23/21   ?  ? SLP LONG TERM GOAL #2  ? Title Pt will utilize speech intelligibility strategies to achieve > 95% speech intelligibility at the conversation level.   ? Baseline new goal - 75% intelligibility   ? Time 12   ? Period Weeks   ? Status New   ? Target Date 07/23/21   ? ?  ?  ? ?  ? ? ? Plan - 05/20/21 1317   ? ? Clinical Impression Statement Pt continues to be very motivated and eager to participate. Pt with upcoming audiology appt tomorrow April 25th. Note sent with pt requesting audiogram be faxed after appt for pt's file. Pt continues with delayed responses d/t memory and/or word finding deficits when answering general questions about book. Skilled ST intervention continues to be required to target pt's mild to moderate impairments to increase her overall functional independence and participation in work as well as community activities.   ? Speech Therapy Frequency 2x / week   ? Duration 12 weeks   ? Treatment/Interventions Language facilitation;Functional tasks;SLP instruction and feedback;Patient/family education;Compensatory strategies;Internal/external aids   ? Potential to Achieve Goals [place]   ? Potential Considerations Severity of impairments;Medical prognosis;Previous level of function   ? SLP Home Exercise Plan provided, see pt instructions section   ? Consulted and Agree with Plan of Care Patient   ? ?  ?  ? ?  ? ? ?Patient will benefit  from skilled therapeutic intervention in order to improve the following deficits and impairments:   ?Cognitive communication deficit ? ?Cerebrovascular accident (CVA) due to thrombosis of precerebral artery (HCC) ? ?Multiple sclerosis exacerbation (HCC) ? ? ? ?Problem List ?Patient Active Problem List  ? Diagnosis Date Noted  ? TB  lung, latent 09/18/2020  ? Poor venous access 02/28/2019  ? Hearing loss 02/09/2019  ? BMI 29.0-29.9,adult 02/01/2019  ? Restless leg syndrome 08/26/2018  ? Status post bariatric surgery 03/23/2018  ? Preoperative clearance 12/28/2017  ? History of CVA (cerebrovascular accident) 12/23/2017  ? Pedal edema 08/06/2017  ? High risk medication use 06/05/2017  ? Abnormal laboratory test result 06/05/2017  ? Hyperlipidemia 05/25/2017  ? Aphasia 05/07/2017  ? Dysphagia   ? Cerebrovascular accident (CVA) due to thrombosis of precerebral artery (HCC) 04/13/2017  ? TIA (transient ischemic attack) 04/08/2017  ? Attention deficit 11/06/2016  ? Impaired glucose tolerance 10/31/2015  ? Pseudobulbar affect 02/14/2015  ? Insomnia 11/16/2014  ? Other fatigue 07/03/2014  ? Urinary frequency 07/03/2014  ? Gait disturbance 06/14/2014  ? Depression with anxiety 06/14/2014  ? Vitamin D deficiency 06/14/2014  ? Multiple sclerosis exacerbation (HCC) 06/04/2014  ? Essential hypertension 06/04/2014  ? Obesity 06/04/2014  ? Obstructive sleep apnea on CPAP 06/04/2014  ? Gastroesophageal reflux disease without esophagitis 06/04/2014  ? Relapsing remitting multiple sclerosis (HCC) 06/04/2014  ? ?Bradin Mcadory B. Dreama Saa, M.S., CCC-SLP, CBIS ?Speech-Language Pathologist ?Rehabilitation Services ?Office [phone] ? ?Vedansh Kerstetter, CCC-SLP ?05/20/2021, 1:19 PM ? ?[place] ?[facility] ?[address] ?[place], [place], [address] ?Phone: [phone]   Fax:  [phone] ? ? ?Name: Arlyn Brandow ?MRN: [id number] ?Date of Birth: 1961-01-12 ? ?

## 2021-05-23 ENCOUNTER — Ambulatory Visit: Payer: Medicare HMO | Admitting: Speech Pathology

## 2021-05-28 ENCOUNTER — Encounter: Payer: Medicare HMO | Admitting: Speech Pathology

## 2021-05-30 ENCOUNTER — Ambulatory Visit: Payer: Medicare HMO | Attending: Family Medicine | Admitting: Speech Pathology

## 2021-05-30 DIAGNOSIS — G35 Multiple sclerosis: Secondary | ICD-10-CM | POA: Diagnosis not present

## 2021-05-30 DIAGNOSIS — I6300 Cerebral infarction due to thrombosis of unspecified precerebral artery: Secondary | ICD-10-CM | POA: Insufficient documentation

## 2021-05-30 DIAGNOSIS — R4701 Aphasia: Secondary | ICD-10-CM | POA: Diagnosis not present

## 2021-05-30 DIAGNOSIS — R41841 Cognitive communication deficit: Secondary | ICD-10-CM | POA: Diagnosis not present

## 2021-05-30 DIAGNOSIS — R471 Dysarthria and anarthria: Secondary | ICD-10-CM | POA: Diagnosis not present

## 2021-05-31 NOTE — Therapy (Signed)
[place] ?[facility] ?[address] ?[place], [place], [address] ?Phone: [phone]   Fax:  [phone] ? ?Speech Language Pathology Treatment ? ?Patient Details  ?Name: Rachel Gilbert ?MRN: [id number] ?Date of Birth: 01-Feb-1960 ?Referring Provider (SLP): Amy Lomax ? ? ?Encounter Date: 05/30/2021 ? ? End of Session - 05/31/21 2111   ? ? Visit Number 5   ? Number of Visits 25   ? Date for SLP Re-Evaluation 07/23/21   ? Authorization Type Aetna Medicare HMO/PPO   ? Authorization Time Period 04/29/2021 thru 07/23/2021   ? Authorization - Visit Number 5   ? Progress Note Due on Visit 10   ? SLP Start Time 0900   ? SLP Stop Time  1000   ? SLP Time Calculation (min) 60 min   ? Activity Tolerance Patient tolerated treatment well   ? ?  ?  ? ?  ? ? ?Past Medical History:  ?Diagnosis Date  ? Complication of anesthesia   ? hard time waking up   ? Hearing loss   ? Hypertension   ? Kidney stones   ? Multiple sclerosis (HCC)   ? Sleep apnea   ? Uses CPAP  ? Stroke [facility]) 03/2017  ? Vision abnormalities   ? ? ?Past Surgical History:  ?Procedure Laterality Date  ? CESAREAN SECTION  1986  ? CHOLECYSTECTOMY  2000  ? laparoscopic  ? IR FLUORO GUIDED NEEDLE PLC ASPIRATION/INJECTION LOC  04/15/2017  ? IR IMAGING GUIDED PORT INSERTION  10/20/2018  ? LAPAROSCOPIC GASTRIC RESTRICTIVE DUODENAL PROCEDURE (DUODENAL SWITCH)    ? RADIOLOGY WITH ANESTHESIA N/A 04/15/2017  ? Procedure: LUMBER PUNCTURE MRI SCAN;  Surgeon: Radiologist, Medication, MD;  [facility];  Service: Radiology;  Laterality: N/A;  ? TEE WITHOUT CARDIOVERSION N/A 04/16/2017  ? Procedure: TRANSESOPHAGEAL ECHOCARDIOGRAM (TEE) WITH LOOP;  Surgeon: Wendall Stade, MD;  Location: [facility] ENDOSCOPY;  Service: Cardiovascular;  Laterality: N/A;  ? ? ?There were no vitals filed for this visit. ? ? Subjective Assessment - 05/31/21 1531   ? ? Subjective pt voices excitment over getting hearing aids   ? Currently in Pain? No/denies   ? ?  ?  ? ?   ? ? ? ? ? ? ? ? ADULT SLP TREATMENT - 05/31/21 0001   ? ?  ? Treatment Provided  ? Treatment provided Cognitive-Linquistic   ?  ? Cognitive-Linquistic Treatment  ? Treatment focused on Cognition;Aphasia;Dysarthria;Patient/family/caregiver education   ? Skilled Treatment Skilled treatment session focused on education surrounding most recent audiogram, written information provided regarding sensorineural hearing loss, speech sounds related to their pitch and decibels. SLP further facilitated session by providing questions about current book that pt is reading. She provides that "it is difficulty to identify with the characters, it is easier for me to understand when it is made into a movie."   ? ?  ?  ? ?  ? ? ? SLP Education - 05/31/21 2110   ? ? Education Details hearing impairment and safety such as insuring that her fire alarm is loud enough, notifying fire department of hearing loss   ? Person(s) Educated Patient   ? Methods Explanation;Demonstration;Handout;Verbal cues   ? Comprehension Verbalized understanding;Need further instruction   ? ?  ?  ? ?  ? ? ? SLP Short Term Goals - 05/07/21 1256   ? ?  ? SLP SHORT TERM GOAL #1  ? Title With minimal cues, pt will generate at least 2 synonyms to  increase ability to alternate words during moments of word finding.   ? Baseline new goal   ? Time 10   ? Period --   sessions  ? Status New   ?  ? SLP SHORT TERM GOAL #2  ? Title With minimal assistance, pt will generate list of 10 items within complex category across 5 sessions.   ? Baseline new goal - 6 animals   ? Time 10   ? Period --   sessions  ? Status New   ?  ? SLP SHORT TERM GOAL #3  ? Title With minimal assistance, pt will use speech intelligibility strategies to achieve 90% speech intelligibility at the sentence level.   ? Baseline new goal   ? Time 10   ? Period --   sessions  ? Status New   ? ?  ?  ? ?  ? ? ? SLP Long Term Goals - 05/07/21 1306   ? ?  ? SLP LONG TERM GOAL #1  ? Title Pt will use word finding  strategies to convey semi-complex information and thoughts.   ? Baseline new goal   ? Time 12   ? Period Weeks   ? Status New   ? Target Date 07/23/21   ?  ? SLP LONG TERM GOAL #2  ? Title Pt will utilize speech intelligibility strategies to achieve > 95% speech intelligibility at the conversation level.   ? Baseline new goal - 75% intelligibility   ? Time 12   ? Period Weeks   ? Status New   ? Target Date 07/23/21   ? ?  ?  ? ?  ? ? ? Plan - 05/31/21 2112   ? ? Clinical Impression Statement Pt continues to make functional progress as a result of skilled ST intervention. Pt to receive hearing aids next week withcontinued ST services required to increase pt's functional independence, improve quality of life as well as maintain pt's ability to work.   ? Speech Therapy Frequency 2x / week   ? Duration 12 weeks   ? Treatment/Interventions Language facilitation;Functional tasks;SLP instruction and feedback;Patient/family education;Compensatory strategies;Internal/external aids   ? Potential to Achieve Goals Good   ? Potential Considerations Severity of impairments;Medical prognosis;Previous level of function   ? Consulted and Agree with Plan of Care Patient   ? ?  ?  ? ?  ? ? ?Patient will benefit from skilled therapeutic intervention in order to improve the following deficits and impairments:   ?Dysarthria and anarthria ? ?Aphasia ? ?Cognitive communication deficit ? ?Cerebrovascular accident (CVA) due to thrombosis of precerebral artery (HCC) ? ?Multiple sclerosis exacerbation (HCC) ? ? ? ?Problem List ?Patient Active Problem List  ? Diagnosis Date Noted  ? 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 Poor venous access 02/28/2019  ? Hearing loss 02/09/2019  ? BMI 29.0-29.9,adult 02/01/2019  ? Restless leg syndrome 08/26/2018  ? Status post bariatric surgery 03/23/2018  ? Preoperative clearance 12/28/2017  ? History of CVA (cerebrovascular accident) 12/23/2017  ? Pedal edema 08/06/2017  ? High risk medication use 06/05/2017  ?  Abnormal laboratory test result 06/05/2017  ? Hyperlipidemia 05/25/2017  ? Aphasia 05/07/2017  ? Dysphagia   ? Cerebrovascular accident (CVA) due to thrombosis of precerebral artery (HCC) 04/13/2017  ? TIA (transient ischemic attack) 04/08/2017  ? Attention deficit 11/06/2016  ? Impaired glucose tolerance 10/31/2015  ? Pseudobulbar affect 02/14/2015  ? Insomnia 11/16/2014  ? Other fatigue 07/03/2014  ? Urinary frequency 07/03/2014  ?  Gait disturbance 06/14/2014  ? Depression with anxiety 06/14/2014  ? Vitamin D deficiency 06/14/2014  ? Multiple sclerosis exacerbation (HCC) 06/04/2014  ? Essential hypertension 06/04/2014  ? Obesity 06/04/2014  ? Obstructive sleep apnea on CPAP 06/04/2014  ? Gastroesophageal reflux disease without esophagitis 06/04/2014  ? Relapsing remitting multiple sclerosis (HCC) 06/04/2014  ? ?Orva Riles B. Dreama Saa, M.S., CCC-SLP, CBIS ?Speech-Language Pathologist ?Rehabilitation Services ?Office [phone] ? ?Daton Szilagyi, CCC-SLP ?05/31/2021, 9:20 PM ? ?[place] ?[facility] ?[address] ?[place], [place], [address] ?Phone: [phone]   Fax:  [phone] ? ? ?Name: Alda Rohl ?MRN: [id number] ?Date of Birth: 1960-04-01 ? ?

## 2021-06-04 ENCOUNTER — Encounter: Payer: Medicare HMO | Admitting: Speech Pathology

## 2021-06-04 DIAGNOSIS — I10 Essential (primary) hypertension: Secondary | ICD-10-CM | POA: Diagnosis not present

## 2021-06-04 DIAGNOSIS — G4733 Obstructive sleep apnea (adult) (pediatric): Secondary | ICD-10-CM | POA: Diagnosis not present

## 2021-06-06 ENCOUNTER — Ambulatory Visit: Payer: Medicare HMO | Admitting: Speech Pathology

## 2021-06-06 DIAGNOSIS — I6300 Cerebral infarction due to thrombosis of unspecified precerebral artery: Secondary | ICD-10-CM

## 2021-06-06 DIAGNOSIS — G35 Multiple sclerosis: Secondary | ICD-10-CM

## 2021-06-06 DIAGNOSIS — R41841 Cognitive communication deficit: Secondary | ICD-10-CM

## 2021-06-06 DIAGNOSIS — R471 Dysarthria and anarthria: Secondary | ICD-10-CM | POA: Diagnosis not present

## 2021-06-06 DIAGNOSIS — R4701 Aphasia: Secondary | ICD-10-CM | POA: Diagnosis not present

## 2021-06-07 NOTE — Patient Instructions (Signed)
Read The Notebook by Lavella Lemons ?

## 2021-06-07 NOTE — Therapy (Signed)
?[facility] ?[place], [place], [address] ?Phone: [phone]   Fax:  [phone] ? ?Speech Language Pathology Treatment ? ?Patient Details  ?Name: Rachel Gilbert ?MRN: [id number] ?Date of Birth: August 25, 1960 ?Referring Provider (SLP): Amy Lomax ? ? ?Encounter Date: 06/06/2021 ? ? End of Session - 06/07/21 1214   ? ? Visit Number 6   ? Number of Visits 25   ? Date for SLP Re-Evaluation 07/23/21   ? Authorization Type Aetna Medicare HMO/PPO   ? Authorization Time Period 04/29/2021 thru 07/23/2021   ? Authorization - Visit Number 6   ? Progress Note Due on Visit 10   ? SLP Start Time 0900   ? SLP Stop Time  1000   ? SLP Time Calculation (min) 60 min   ? Activity Tolerance Patient tolerated treatment well   ? ?  ?  ? ?  ? ? ?Past Medical History:  ?Diagnosis Date  ? Complication of anesthesia   ? hard time waking up   ? Hearing loss   ? Hypertension   ? Kidney stones   ? Multiple sclerosis ([place])   ? Sleep apnea   ? Uses CPAP  ? Stroke [facility]) 03/2017  ? Vision abnormalities   ? ? ?Past Surgical History:  ?Procedure Laterality Date  ? [place]  ? CHOLECYSTECTOMY  2000  ? laparoscopic  ? IR FLUORO GUIDED NEEDLE PLC ASPIRATION/INJECTION LOC  04/15/2017  ? IR IMAGING GUIDED PORT INSERTION  10/20/2018  ? LAPAROSCOPIC GASTRIC RESTRICTIVE DUODENAL PROCEDURE (DUODENAL SWITCH)    ? RADIOLOGY WITH ANESTHESIA N/A 04/15/2017  ? Procedure: LUMBER PUNCTURE MRI SCAN;  Surgeon: Radiologist, Medication, MD;  Location: [facility];  Service: Radiology;  Laterality: N/A;  ? TEE WITHOUT CARDIOVERSION N/A 04/16/2017  ? Procedure: TRANSESOPHAGEAL ECHOCARDIOGRAM (TEE) WITH LOOP;  Surgeon: Josue Hector, MD;  Location: [place];  Service: Cardiovascular;  Laterality: N/A;  ? ? ?There were no vitals filed for this visit. ? ? Subjective Assessment - 06/07/21 0932   ? ? Subjective "I can hear" pt has new hearing aids   ? Currently in Pain? No/denies   ? ?  ?  ? ?   ? ? ? ? ? ? ? ? ADULT SLP TREATMENT - 06/07/21 0001   ? ?  ? Treatment Provided  ? Treatment provided Cognitive-Linquistic   ?  ? Cognitive-Linquistic Treatment  ? Treatment focused on Cognition;Aphasia;Dysarthria;Patient/family/caregiver education   ? Skilled Treatment Skilled treatment session focused on pt's dysarthria goals. SLP facilitated session by having pt participate in [place]? game. In addition, pt was recorded for audiofeedback. Pt's speech intelligibility was 100% with no evidence of slurred speech or gliding of "r" Pt listened to recordings and began to cry. She stated, "I knew that I was missing a lot but didn't realize how much I was missing."   ? ?  ?  ? ?  ? ? ? SLP Education - 06/07/21 [id number]   ? ? Education Details speech intelligibility much improved d/t ability to hear herself   ? Person(s) Educated Patient   ? Methods Explanation;Demonstration;Verbal cues;Handout   ? Comprehension Verbalized understanding;Need further instruction   ? ?  ?  ? ?  ? ? ? SLP Short Term Goals - 05/07/21 1256   ? ?  ? SLP SHORT TERM GOAL #1  ? Title With minimal cues, pt will generate at least 2 synonyms to increase ability to alternate words during  moments of word finding.   ? Baseline new goal   ? Time 10   ? Period --   sessions  ? Status New   ?  ? SLP SHORT TERM GOAL #2  ? Title With minimal assistance, pt will generate list of 10 items within complex category across 5 sessions.   ? Baseline new goal - 6 animals   ? Time 10   ? Period --   sessions  ? Status New   ?  ? SLP SHORT TERM GOAL #3  ? Title With minimal assistance, pt will use speech intelligibility strategies to achieve 90% speech intelligibility at the sentence level.   ? Baseline new goal   ? Time 10   ? Period --   sessions  ? Status New   ? ?  ?  ? ?  ? ? ? SLP Long Term Goals - 05/07/21 1306   ? ?  ? SLP LONG TERM GOAL #1  ? Title Pt will use word finding strategies to convey semi-complex information and thoughts.   ? Baseline new goal    ? Time 12   ? Period Weeks   ? Status New   ? Target Date 07/23/21   ?  ? SLP LONG TERM GOAL #2  ? Title Pt will utilize speech intelligibility strategies to achieve > 95% speech intelligibility at the conversation level.   ? Baseline new goal - 75% intelligibility   ? Time 12   ? Period Weeks   ? Status New   ? Target Date 07/23/21   ? ?  ?  ? ?  ? ? ? Plan - 06/07/21 1215   ? ? Clinical Impression Statement Pt with new hearing aids and much improved speech intelligibility as a result. Also suspect that pt's memory might also improve as pt is no longer missing portions of information. Skilled ST services required to increase pt's functional independence, improve quaility of life as well as maintain pt's ability to work.   ? Speech Therapy Frequency 2x / week   ? Duration 12 weeks   ? Treatment/Interventions Language facilitation;Functional tasks;SLP instruction and feedback;Patient/family education;Compensatory strategies;Internal/external aids   ? Potential to Achieve Goals Good   ? SLP Home Exercise Plan provided, see pt instructions section   ? Consulted and Agree with Plan of Care Patient   ? ?  ?  ? ?  ? ? ?Patient will benefit from skilled therapeutic intervention in order to improve the following deficits and impairments:   ?Cognitive communication deficit ? ?Dysarthria and anarthria ? ?Cerebrovascular accident (CVA) due to thrombosis of precerebral artery ([place]) ? ?Multiple sclerosis exacerbation ([place]) ? ? ? ?Problem List ?Patient Active Problem List  ? Diagnosis Date Noted  ? 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 Poor venous access 02/28/2019  ? Hearing loss 02/09/2019  ? BMI 29.0-29.9,adult 02/01/2019  ? Restless leg syndrome 08/26/2018  ? Status post bariatric surgery 03/23/2018  ? Preoperative clearance 12/28/2017  ? History of CVA (cerebrovascular accident) 12/23/2017  ? Pedal edema 08/06/2017  ? High risk medication use 06/05/2017  ? Abnormal laboratory test result 06/05/2017  ? Hyperlipidemia 05/25/2017  ?  Aphasia 05/07/2017  ? Dysphagia   ? Cerebrovascular accident (CVA) due to thrombosis of precerebral artery ([place]) 04/13/2017  ? TIA (transient ischemic attack) 04/08/2017  ? Attention deficit 11/06/2016  ? Impaired glucose tolerance 10/31/2015  ? Pseudobulbar affect 02/14/2015  ? Insomnia 11/16/2014  ? Other fatigue 07/03/2014  ? Urinary frequency 07/03/2014  ?  Gait disturbance 06/14/2014  ? Depression with anxiety 06/14/2014  ? Vitamin D deficiency 06/14/2014  ? Multiple sclerosis exacerbation ([place]) 06/04/2014  ? Essential hypertension 06/04/2014  ? Obesity 06/04/2014  ? Obstructive sleep apnea on CPAP 06/04/2014  ? Gastroesophageal reflux disease without esophagitis 06/04/2014  ? Relapsing remitting multiple sclerosis ([place]) 06/04/2014  ? ?Alyanna Stoermer B. Rutherford Nail, M.S., CCC-SLP, CBIS ?Speech-Language Pathologist ?Rehabilitation Services ?Office [phone] ? ?[place], [place] ?06/07/2021, 12:17 PM ? ?[place] [facility] ?[place], [place], [address] ?Phone: [phone]   Fax:  [phone] ? ? ?Name: Rachel Gilbert ?MRN: [id number] ?Date of Birth: 1960/04/10 ? ?

## 2021-06-11 ENCOUNTER — Encounter: Payer: Medicare HMO | Admitting: Speech Pathology

## 2021-06-13 ENCOUNTER — Encounter: Payer: Medicare HMO | Admitting: Speech Pathology

## 2021-06-13 DIAGNOSIS — G35 Multiple sclerosis: Secondary | ICD-10-CM | POA: Diagnosis not present

## 2021-06-17 DIAGNOSIS — R7302 Impaired glucose tolerance (oral): Secondary | ICD-10-CM | POA: Diagnosis not present

## 2021-06-17 DIAGNOSIS — Z1231 Encounter for screening mammogram for malignant neoplasm of breast: Secondary | ICD-10-CM | POA: Diagnosis not present

## 2021-06-17 DIAGNOSIS — Z7189 Other specified counseling: Secondary | ICD-10-CM | POA: Diagnosis not present

## 2021-06-17 DIAGNOSIS — Z713 Dietary counseling and surveillance: Secondary | ICD-10-CM | POA: Diagnosis not present

## 2021-06-17 DIAGNOSIS — I10 Essential (primary) hypertension: Secondary | ICD-10-CM | POA: Diagnosis not present

## 2021-06-17 DIAGNOSIS — G35 Multiple sclerosis: Secondary | ICD-10-CM | POA: Diagnosis not present

## 2021-06-17 DIAGNOSIS — H906 Mixed conductive and sensorineural hearing loss, bilateral: Secondary | ICD-10-CM | POA: Diagnosis not present

## 2021-06-17 DIAGNOSIS — R7989 Other specified abnormal findings of blood chemistry: Secondary | ICD-10-CM | POA: Diagnosis not present

## 2021-06-17 DIAGNOSIS — Z8673 Personal history of transient ischemic attack (TIA), and cerebral infarction without residual deficits: Secondary | ICD-10-CM | POA: Diagnosis not present

## 2021-06-17 DIAGNOSIS — Z7689 Persons encountering health services in other specified circumstances: Secondary | ICD-10-CM | POA: Diagnosis not present

## 2021-06-17 DIAGNOSIS — E782 Mixed hyperlipidemia: Secondary | ICD-10-CM | POA: Diagnosis not present

## 2021-06-17 DIAGNOSIS — F32A Depression, unspecified: Secondary | ICD-10-CM | POA: Diagnosis not present

## 2021-06-17 DIAGNOSIS — R69 Illness, unspecified: Secondary | ICD-10-CM | POA: Diagnosis not present

## 2021-06-17 DIAGNOSIS — Z6831 Body mass index (BMI) 31.0-31.9, adult: Secondary | ICD-10-CM | POA: Diagnosis not present

## 2021-06-18 ENCOUNTER — Encounter: Payer: Medicare HMO | Admitting: Speech Pathology

## 2021-06-20 ENCOUNTER — Ambulatory Visit: Payer: Medicare HMO | Admitting: Speech Pathology

## 2021-06-21 ENCOUNTER — Ambulatory Visit: Payer: Medicare HMO | Admitting: Speech Pathology

## 2021-06-21 DIAGNOSIS — R471 Dysarthria and anarthria: Secondary | ICD-10-CM | POA: Diagnosis not present

## 2021-06-21 DIAGNOSIS — R41841 Cognitive communication deficit: Secondary | ICD-10-CM | POA: Diagnosis not present

## 2021-06-21 DIAGNOSIS — G35 Multiple sclerosis: Secondary | ICD-10-CM

## 2021-06-21 DIAGNOSIS — I6300 Cerebral infarction due to thrombosis of unspecified precerebral artery: Secondary | ICD-10-CM

## 2021-06-21 DIAGNOSIS — R4701 Aphasia: Secondary | ICD-10-CM | POA: Diagnosis not present

## 2021-06-21 NOTE — Therapy (Signed)
[facility] MAIN [facility] SERVICES [address] [place], [place], [address] Phone: [phone]   Fax:  [phone]  Speech Language Pathology Treatment  Patient Details  Name: Rachel Gilbert MRN: [id number] Date of Birth: 27-Dec-1960 Referring Provider (SLP): Amy Lomax   Encounter Date: 06/21/2021   End of Session - 06/21/21 0910     Visit Number 7    Number of Visits 25    Date for SLP Re-Evaluation 07/23/21    Authorization Type Aetna Medicare HMO/PPO    Authorization Time Period 04/29/2021 thru 07/23/2021    Authorization - Visit Number 7    Progress Note Due on Visit 10    SLP Start Time 0745    SLP Stop Time  0845    SLP Time Calculation (min) 60 min    Activity Tolerance Patient tolerated treatment well             Past Medical History:  Diagnosis Date   Complication of anesthesia    hard time waking up    Hearing loss    Hypertension    Kidney stones    Multiple sclerosis (HCC)    Sleep apnea    Uses CPAP   Stroke ([facility]) 03/2017   Vision abnormalities     Past Surgical History:  Procedure Laterality Date   CESAREAN SECTION  1986   CHOLECYSTECTOMY  2000   laparoscopic   IR FLUORO GUIDED NEEDLE PLC ASPIRATION/INJECTION LOC  04/15/2017   IR IMAGING GUIDED PORT INSERTION  10/20/2018   LAPAROSCOPIC GASTRIC RESTRICTIVE DUODENAL PROCEDURE (DUODENAL SWITCH)     RADIOLOGY WITH ANESTHESIA N/A 04/15/2017   Procedure: LUMBER PUNCTURE MRI SCAN;  Surgeon: Radiologist, Medication, MD;  [facility];  Service: Radiology;  Laterality: N/A;   TEE WITHOUT CARDIOVERSION N/A 04/16/2017   Procedure: TRANSESOPHAGEAL ECHOCARDIOGRAM (TEE) WITH LOOP;  Surgeon: Wendall Stade, MD;  Location: [facility] END[facility]COPY;  Service: Cardiovascular;  Laterality: N/A;    There were no vitals filed for this visit.   Subjective Assessment - 06/21/21 0856     Subjective "I am afraid that I am talking too loud"    Currently in Pain? No/denies                    ADULT SLP TREATMENT - 06/21/21 0001       Treatment Provided   Treatment provided Cognitive-Linquistic      Cognitive-Linquistic Treatment   Treatment focused on Cognition;Aphasia;Dysarthria;Patient/family/caregiver education    Skilled Treatment Skilled treatment session focused on pt's speech language abilities. SLP facilitated session by recording pt during casual conversation to assist with decreasing her fear that she is talking too loud. Playback provided and pt able to understand that her volume is appropriate. SLP further facilitated session by introducing strategy of watching YouTube videos or author interviews prior to reading the specific book. This may allow pt greater understanding before reading the book.              SLP Education - 06/21/21 0910     Education Details while pt sounds louder to herself, her volume is appropriate    Person(s) Educated Patient    Methods Explanation;Demonstration;Verbal cues    Comprehension Verbalized understanding;Returned demonstration;Need further instruction              SLP Short Term Goals - 05/07/21 1256       SLP SHORT TERM GOAL #1   Title With minimal cues, pt will generate at least 2 synonyms  to increase ability to alternate words during moments of word finding.    Baseline new goal    Time 10    Period --   sessions   Status New      SLP SHORT TERM GOAL #2   Title With minimal assistance, pt will generate list of 10 items within complex category across 5 sessions.    Baseline new goal - 6 animals    Time 10    Period --   sessions   Status New      SLP SHORT TERM GOAL #3   Title With minimal assistance, pt will use speech intelligibility strategies to achieve 90% speech intelligibility at the sentence level.    Baseline new goal    Time 10    Period --   sessions   Status New              SLP Long Term Goals - 05/07/21 1306       SLP LONG TERM GOAL #1   Title Pt will use word  finding strategies to convey semi-complex information and thoughts.    Baseline new goal    Time 12    Period Weeks    Status New    Target Date 07/23/21      SLP LONG TERM GOAL #2   Title Pt will utilize speech intelligibility strategies to achieve > 95% speech intelligibility at the conversation level.    Baseline new goal - 75% intelligibility    Time 12    Period Weeks    Status New    Target Date 07/23/21              Plan - 06/21/21 0911     Clinical Impression Statement Pt continues to present with improved cognitive communication abilities. In addition, her speech intelligibility continues to improve d/t new hearing aids. Pt continues to benefit from skilled ST intervention to improve her mild cognitive communication impairment to promote functional independence.    Speech Therapy Frequency 2x / week    Duration 12 weeks    Treatment/Interventions Language facilitation;Functional tasks;SLP instruction and feedback;Patient/family education;Compensatory strategies;Internal/external aids    Potential to Achieve Goals Good    SLP Home Exercise Plan provided, see pt instructions section    Consulted and Agree with Plan of Care Patient             Patient will benefit from skilled therapeutic intervention in order to improve the following deficits and impairments:   Cognitive communication deficit  Cerebrovascular accident (CVA) due to thrombosis of precerebral artery (HCC)  Multiple sclerosis exacerbation (HCC)    Problem List Patient Active Problem List   Diagnosis Date Noted   TB lung, latent 09/18/2020   Poor venous access 02/28/2019   Hearing loss 02/09/2019   BMI 29.0-29.9,adult 02/01/2019   Restless leg syndrome 08/26/2018   Status post bariatric surgery 03/23/2018   Preoperative clearance 12/28/2017   History of CVA (cerebrovascular accident) 12/23/2017   Pedal edema 08/06/2017   High risk medication use 06/05/2017   Abnormal laboratory test result  06/05/2017   Hyperlipidemia 05/25/2017   Aphasia 05/07/2017   Dysphagia    Cerebrovascular accident (CVA) due to thrombosis of precerebral artery (HCC) 04/13/2017   TIA (transient ischemic attack) 04/08/2017   Attention deficit 11/06/2016   Impaired glucose tolerance 10/31/2015   Pseudobulbar affect 02/14/2015   Insomnia 11/16/2014   Other fatigue 07/03/2014   Urinary frequency 07/03/2014   Gait disturbance 06/14/2014   Depression  with anxiety 06/14/2014   Vitamin D deficiency 06/14/2014   Multiple sclerosis exacerbation (HCC) 06/04/2014   Essential hypertension 06/04/2014   Obesity 06/04/2014   Obstructive sleep apnea on CPAP 06/04/2014   Gastroesophageal reflux disease without esophagitis 06/04/2014   Relapsing remitting multiple sclerosis (HCC) 06/04/2014   Bonham Zingale B. Dreama Saa M.S., CCC-SLP, CBIS Speech-Language Pathologist Certified Brain Injury Specialist [facility]  [facility] [phone] Ascom [phone] Fax [phone]  Leo Rod 06/21/2021, 9:39 AM  [facility] MAIN [facility] SERVICES [address] [place], [place], [address] Phone: [phone]   Fax:  [phone]   Name: Seniya Stoffers MRN: [id number] Date of Birth: 1961-01-19

## 2021-06-21 NOTE — Patient Instructions (Signed)
Continue using external memory aids, re-attempt reading new book after listening to [employer] interview

## 2021-06-27 ENCOUNTER — Ambulatory Visit: Payer: Medicare HMO | Attending: Family Medicine | Admitting: Speech Pathology

## 2021-06-27 DIAGNOSIS — R4701 Aphasia: Secondary | ICD-10-CM | POA: Insufficient documentation

## 2021-06-27 DIAGNOSIS — I6300 Cerebral infarction due to thrombosis of unspecified precerebral artery: Secondary | ICD-10-CM | POA: Diagnosis not present

## 2021-06-27 DIAGNOSIS — G35 Multiple sclerosis: Secondary | ICD-10-CM | POA: Insufficient documentation

## 2021-06-27 DIAGNOSIS — R41841 Cognitive communication deficit: Secondary | ICD-10-CM | POA: Diagnosis not present

## 2021-06-28 NOTE — Therapy (Signed)
[facility] MAIN [facility] SERVICES [address] [place], [place], [address] Phone: [phone]   Fax:  [phone]  Speech Language Pathology Treatment  Patient Details  Name: Rachel Gilbert MRN: [id number] Date of Birth: February 06, 1960 Referring Provider (SLP): Amy Lomax   Encounter Date: 06/27/2021   End of Session - 06/28/21 1507     Visit Number 8    Number of Visits 25    Date for SLP Re-Evaluation 07/23/21    Authorization Type Aetna Medicare HMO/PPO    Authorization Time Period 04/29/2021 thru 07/23/2021    Authorization - Visit Number 8    Progress Note Due on Visit 10    SLP Start Time 0900    SLP Stop Time  1000    SLP Time Calculation (min) 60 min    Activity Tolerance Patient tolerated treatment well             Past Medical History:  Diagnosis Date   Complication of anesthesia    hard time waking up    Hearing loss    Hypertension    Kidney stones    Multiple sclerosis (HCC)    Sleep apnea    Uses CPAP   Stroke (HCC) 03/2017   Vision abnormalities     Past Surgical History:  Procedure Laterality Date   CESAREAN SECTION  1986   CHOLECYSTECTOMY  2000   laparoscopic   IR FLUORO GUIDED NEEDLE PLC ASPIRATION/INJECTION LOC  04/15/2017   IR IMAGING GUIDED PORT INSERTION  10/20/2018   LAPAROSCOPIC GASTRIC RESTRICTIVE DUODENAL PROCEDURE (DUODENAL SWITCH)     RADIOLOGY WITH ANESTHESIA N/A 04/15/2017   Procedure: LUMBER PUNCTURE MRI SCAN;  Surgeon: Radiologist, Medication, MD;  [facility];  Service: Radiology;  Laterality: N/A;   TEE WITHOUT CARDIOVERSION N/A 04/16/2017   Procedure: TRANSESOPHAGEAL ECHOCARDIOGRAM (TEE) WITH LOOP;  Surgeon: Wendall Stade, MD;  Location: [facility] ENDOSCOPY;  Service: Cardiovascular;  Laterality: N/A;    There were no vitals filed for this visit.   Subjective Assessment - 06/28/21 1306     Subjective pt pleasant, motivated    Currently in Pain? No/denies                   ADULT SLP  TREATMENT - 06/28/21 0001       Treatment Provided   Treatment provided Cognitive-Linquistic      Cognitive-Linquistic Treatment   Treatment focused on Cognition;Aphasia;Dysarthria;Patient/family/caregiver education    Skilled Treatment Skilled treatment focused on setting realistic goals and identifying pt's current concerns and aligning those with ways to compensate rather than restore ability.PT identify the following areas/activities that bother her: Not being able to talk while driving, knowing how to get places but can't give directions to that same place, having communication breakdowns d/t word finding deficits.              SLP Education - 06/28/21 1507     Education Details provided    Person(s) Educated Patient    Methods Explanation;Demonstration;Verbal cues;Handout    Comprehension Verbalized understanding;Returned demonstration              SLP Short Term Goals - 05/07/21 1256       SLP SHORT TERM GOAL #1   Title With minimal cues, pt will generate at least 2 synonyms to increase ability to alternate words during moments of word finding.    Baseline new goal    Time 10    Period --   sessions  Status New      SLP SHORT TERM GOAL #2   Title With minimal assistance, pt will generate list of 10 items within complex category across 5 sessions.    Baseline new goal - 6 animals    Time 10    Period --   sessions   Status New      SLP SHORT TERM GOAL #3   Title With minimal assistance, pt will use speech intelligibility strategies to achieve 90% speech intelligibility at the sentence level.    Baseline new goal    Time 10    Period --   sessions   Status New              SLP Long Term Goals - 05/07/21 1306       SLP LONG TERM GOAL #1   Title Pt will use word finding strategies to convey semi-complex information and thoughts.    Baseline new goal    Time 12    Period Weeks    Status New    Target Date 07/23/21      SLP LONG TERM GOAL #2    Title Pt will utilize speech intelligibility strategies to achieve > 95% speech intelligibility at the conversation level.    Baseline new goal - 75% intelligibility    Time 12    Period Weeks    Status New    Target Date 07/23/21              Plan - 06/28/21 1507     Clinical Impression Statement Pt continues to present with improved cognitive communication abilities. In addition, her speech intelligibility continues to improve d/t new hearing aids. Pt continues to benefit from skilled ST intervention to improve her mild cognitive communication impairment to promote functional independence.    Speech Therapy Frequency 2x / week    Duration 12 weeks    Treatment/Interventions Language facilitation;Functional tasks;SLP instruction and feedback;Patient/family education;Compensatory strategies;Internal/external aids    Potential to Achieve Goals Good    SLP Home Exercise Plan provided, see pt instructions section    Consulted and Agree with Plan of Care Patient             Patient will benefit from skilled therapeutic intervention in order to improve the following deficits and impairments:   Cognitive communication deficit  Aphasia  Cerebrovascular accident (CVA) due to thrombosis of precerebral artery (HCC)  Multiple sclerosis exacerbation (HCC)    Problem List Patient Active Problem List   Diagnosis Date Noted   TB lung, latent 09/18/2020   Poor venous access 02/28/2019   Hearing loss 02/09/2019   BMI 29.0-29.9,adult 02/01/2019   Restless leg syndrome 08/26/2018   Status post bariatric surgery 03/23/2018   Preoperative clearance 12/28/2017   History of CVA (cerebrovascular accident) 12/23/2017   Pedal edema 08/06/2017   High risk medication use 06/05/2017   Abnormal laboratory test result 06/05/2017   Hyperlipidemia 05/25/2017   Aphasia 05/07/2017   Dysphagia    Cerebrovascular accident (CVA) due to thrombosis of precerebral artery (HCC) 04/13/2017   TIA  (transient ischemic attack) 04/08/2017   Attention deficit 11/06/2016   Impaired glucose tolerance 10/31/2015   Pseudobulbar affect 02/14/2015   Insomnia 11/16/2014   Other fatigue 07/03/2014   Urinary frequency 07/03/2014   Gait disturbance 06/14/2014   Depression with anxiety 06/14/2014   Vitamin D deficiency 06/14/2014   Multiple sclerosis exacerbation (HCC) 06/04/2014   Essential hypertension 06/04/2014   Obesity 06/04/2014   Obstructive sleep apnea  on CPAP 06/04/2014   Gastroesophageal reflux disease without esophagitis 06/04/2014   Relapsing remitting multiple sclerosis (HCC) 06/04/2014   Charma Mocarski B. Dreama Saa M.S., CCC-SLP, CBIS Speech-Language Pathologist Certified Brain Injury Specialist [place]  [facility] [phone] Ascom [phone] Fax [phone]  Leo Rod 06/28/2021, 3:08 PM  [facility] MAIN [facility] SERVICES [address] [place], [place], [address] Phone: [phone]   Fax:  [phone]   Name: Rachel Gilbert MRN: [id number] Date of Birth: 1960/09/11

## 2021-07-02 ENCOUNTER — Encounter: Payer: Medicare HMO | Admitting: Speech Pathology

## 2021-07-03 DIAGNOSIS — I10 Essential (primary) hypertension: Secondary | ICD-10-CM | POA: Diagnosis not present

## 2021-07-03 DIAGNOSIS — G4733 Obstructive sleep apnea (adult) (pediatric): Secondary | ICD-10-CM | POA: Diagnosis not present

## 2021-07-04 ENCOUNTER — Ambulatory Visit: Payer: Medicare HMO | Admitting: Speech Pathology

## 2021-07-05 ENCOUNTER — Ambulatory Visit: Payer: Medicare HMO | Admitting: Speech Pathology

## 2021-07-09 ENCOUNTER — Ambulatory Visit: Payer: Medicare HMO | Admitting: Speech Pathology

## 2021-07-11 ENCOUNTER — Ambulatory Visit: Payer: Medicare HMO | Admitting: Speech Pathology

## 2021-07-11 DIAGNOSIS — R4701 Aphasia: Secondary | ICD-10-CM | POA: Diagnosis not present

## 2021-07-11 DIAGNOSIS — I6300 Cerebral infarction due to thrombosis of unspecified precerebral artery: Secondary | ICD-10-CM

## 2021-07-11 DIAGNOSIS — R41841 Cognitive communication deficit: Secondary | ICD-10-CM | POA: Diagnosis not present

## 2021-07-11 DIAGNOSIS — G35 Multiple sclerosis: Secondary | ICD-10-CM | POA: Diagnosis not present

## 2021-07-11 NOTE — Therapy (Signed)
[facility] MAIN [facility] SERVICES [address] [place], [place], [address] Phone: [phone]   Fax:  [phone]  Speech Language Pathology Treatment  Patient Details  Name: Rachel Gilbert MRN: [id number] Date of Birth: Nov 22, 1960 Referring Provider (SLP): Amy Lomax   Encounter Date: 07/11/2021   End of Session - 07/11/21 2222     Visit Number 9    Number of Visits 25    Date for SLP Re-Evaluation 07/23/21    Authorization Type [employer] Medicare HMO/PPO    Authorization Time Period 04/29/2021 thru 07/23/2021    Authorization - Visit Number 9    Progress Note Due on Visit 10    SLP Start Time 0900    SLP Stop Time  1000    SLP Time Calculation (min) 60 min    Activity Tolerance Patient tolerated treatment well             Past Medical History:  Diagnosis Date   Complication of anesthesia    hard time waking up    Hearing loss    Hypertension    Kidney stones    Multiple sclerosis (HCC)    Sleep apnea    Uses CPAP   Stroke (HCC) 03/2017   Vision abnormalities     Past Surgical History:  Procedure Laterality Date   CESAREAN SECTION  1986   CHOLECYSTECTOMY  2000   laparoscopic   IR FLUORO GUIDED NEEDLE PLC ASPIRATION/INJECTION LOC  04/15/2017   IR IMAGING GUIDED PORT INSERTION  10/20/2018   LAPAROSCOPIC GASTRIC RESTRICTIVE DUODENAL PROCEDURE (DUODENAL SWITCH)     RADIOLOGY WITH ANESTHESIA N/A 04/15/2017   Procedure: LUMBER PUNCTURE MRI SCAN;  Surgeon: Radiologist, Medication, MD;  Location: [facility] OR;  Service: Radiology;  Laterality: N/A;   TEE WITHOUT CARDIOVERSION N/A 04/16/2017   Procedure: TRANSESOPHAGEAL ECHOCARDIOGRAM (TEE) WITH LOOP;  Surgeon: Wendall Stade, MD;  Location: [facility] ENDOSCOPY;  Service: Cardiovascular;  Laterality: N/A;    There were no vitals filed for this visit.   Subjective Assessment - 07/11/21 2210     Subjective pt pleasant, motivated    Currently in Pain? No/denies                   ADULT  SLP TREATMENT - 07/11/21 0001       Treatment Provided   Treatment provided Cognitive-Linquistic      Cognitive-Linquistic Treatment   Treatment focused on Cognition;Patient/family/caregiver education    Skilled Treatment Skilled treatment session focused on instructing pt in strategies to repair communication breakdowns. Pt had several moments of word finding/recall difficulty during session but she employed a pause to aid in her recall. She was very effective and didn't have an emotional response. In addition, she demonstrated improved recall of information within book. Pt's speech intelligibiilty is also much improved with very few moments of imprecise articulation.              SLP Education - 07/11/21 2221     Education Details improved speech intelligibility, improved recall of information found within book, improved use of strategies during moments of communication breakdown    Person(s) Educated Patient    Methods Explanation;Demonstration;Verbal cues    Comprehension Verbalized understanding;Returned demonstration              SLP Short Term Goals - 05/07/21 1256       SLP SHORT TERM GOAL #1   Title With minimal cues, pt will generate at least 2 synonyms to increase ability to  alternate words during moments of word finding.    Baseline new goal    Time 10    Period --   sessions   Status New      SLP SHORT TERM GOAL #2   Title With minimal assistance, pt will generate list of 10 items within complex category across 5 sessions.    Baseline new goal - 6 animals    Time 10    Period --   sessions   Status New      SLP SHORT TERM GOAL #3   Title With minimal assistance, pt will use speech intelligibility strategies to achieve 90% speech intelligibility at the sentence level.    Baseline new goal    Time 10    Period --   sessions   Status New              SLP Long Term Goals - 05/07/21 1306       SLP LONG TERM GOAL #1   Title Pt will use word  finding strategies to convey semi-complex information and thoughts.    Baseline new goal    Time 12    Period Weeks    Status New    Target Date 07/23/21      SLP LONG TERM GOAL #2   Title Pt will utilize speech intelligibility strategies to achieve > 95% speech intelligibility at the conversation level.    Baseline new goal - 75% intelligibility    Time 12    Period Weeks    Status New    Target Date 07/23/21              Plan - 07/11/21 2222     Clinical Impression Statement Pt continues to present with improved cognitive communication abilities. In addition, her speech intelligibility continues to improve d/t new hearing aids. Pt continues to benefit from skilled ST intervention to improve her mild cognitive communication impairment to promote functional independence.    Speech Therapy Frequency 2x / week    Duration 12 weeks    Treatment/Interventions Language facilitation;Functional tasks;SLP instruction and feedback;Patient/family education;Compensatory strategies;Internal/external aids    Potential to Achieve Goals Good    SLP Home Exercise Plan provided, see pt instructions section    Consulted and Agree with Plan of Care Patient             Patient will benefit from skilled therapeutic intervention in order to improve the following deficits and impairments:   Cognitive communication deficit  Cerebrovascular accident (CVA) due to thrombosis of precerebral artery (HCC)  Multiple sclerosis exacerbation (HCC)    Problem List Patient Active Problem List   Diagnosis Date Noted   TB lung, latent 09/18/2020   Poor venous access 02/28/2019   Hearing loss 02/09/2019   BMI 29.0-29.9,adult 02/01/2019   Restless leg syndrome 08/26/2018   Status post bariatric surgery 03/23/2018   Preoperative clearance 12/28/2017   History of CVA (cerebrovascular accident) 12/23/2017   Pedal edema 08/06/2017   High risk medication use 06/05/2017   Abnormal laboratory test result  06/05/2017   Hyperlipidemia 05/25/2017   Aphasia 05/07/2017   Dysphagia    Cerebrovascular accident (CVA) due to thrombosis of precerebral artery (HCC) 04/13/2017   TIA (transient ischemic attack) 04/08/2017   Attention deficit 11/06/2016   Impaired glucose tolerance 10/31/2015   Pseudobulbar affect 02/14/2015   Insomnia 11/16/2014   Other fatigue 07/03/2014   Urinary frequency 07/03/2014   Gait disturbance 06/14/2014   Depression with anxiety 06/14/2014  Vitamin D deficiency 06/14/2014   Multiple sclerosis exacerbation (HCC) 06/04/2014   Essential hypertension 06/04/2014   Obesity 06/04/2014   Obstructive sleep apnea on CPAP 06/04/2014   Gastroesophageal reflux disease without esophagitis 06/04/2014   Relapsing remitting multiple sclerosis (HCC) 06/04/2014   Emilyrose Darrah B. Dreama Saa M.S., CCC-SLP, CBIS Speech-Language Pathologist Certified Brain Injury Specialist [facility]  [facility] [phone] Ascom [phone] Fax [phone]  Leo Rod 07/11/2021, 10:23 PM   [facility] MAIN [facility] SERVICES [address] [place], [place], [address] Phone: [phone]   Fax:  [phone]   Name: Rachel Gilbert MRN: [id number] Date of Birth: 1960-04-07

## 2021-07-11 NOTE — Patient Instructions (Signed)
Answer questions "who, what, when, where" at the end of each chapter within book, make note on sticky notes to help retain information

## 2021-07-15 ENCOUNTER — Telehealth: Payer: Self-pay | Admitting: Neurology

## 2021-07-15 NOTE — Telephone Encounter (Signed)
Rescheduled 10/5 appt with pt over the phone- Dr. Sater out. 

## 2021-07-16 ENCOUNTER — Encounter: Payer: Medicare HMO | Admitting: Speech Pathology

## 2021-07-18 ENCOUNTER — Encounter: Payer: Medicare HMO | Admitting: Speech Pathology

## 2021-07-23 ENCOUNTER — Ambulatory Visit: Payer: Medicare HMO | Admitting: Speech Pathology

## 2021-07-23 DIAGNOSIS — G35 Multiple sclerosis: Secondary | ICD-10-CM

## 2021-07-23 DIAGNOSIS — I6300 Cerebral infarction due to thrombosis of unspecified precerebral artery: Secondary | ICD-10-CM

## 2021-07-23 DIAGNOSIS — R41841 Cognitive communication deficit: Secondary | ICD-10-CM | POA: Diagnosis not present

## 2021-07-23 DIAGNOSIS — R4701 Aphasia: Secondary | ICD-10-CM | POA: Diagnosis not present

## 2021-07-24 ENCOUNTER — Encounter: Payer: Self-pay | Admitting: Speech Pathology

## 2021-07-24 NOTE — Therapy (Signed)
OUTPATIENT SPEECH LANGUAGE PATHOLOGY TREATMENT NOTE 10th SESSION PROGRESS NOTE RE-CERTIFICATION   Patient Name: Rachel Gilbert MRN: [id number] DOB:26-Sep-1960, 61 y.o., female Today's Date: 07/24/2021  PCP: Chauncy Lean, FMP REFERRING PROVIDER: Debbora Presto, NP   End of Session - 07/24/21 0759     Visit Number 10    Number of Visits 25    Date for SLP Re-Evaluation 10/16/21    Authorization Type [employer] Medicare HMO/PPO    Authorization Time Period 07/23/2021 thru 10/16/2021    Authorization - Visit Number 10    Progress Note Due on Visit 10    SLP Start Time 0900    SLP Stop Time  1000    SLP Time Calculation (min) 60 min    Activity Tolerance Patient tolerated treatment well             Past Medical History:  Diagnosis Date   Complication of anesthesia    hard time waking up    Hearing loss    Hypertension    Kidney stones    Multiple sclerosis ([place])    Sleep apnea    Uses CPAP   Stroke ([place]) 03/2017   Vision abnormalities    Past Surgical History:  Procedure Laterality Date   CESAREAN SECTION  1986   CHOLECYSTECTOMY  2000   laparoscopic   IR FLUORO GUIDED NEEDLE PLC ASPIRATION/INJECTION LOC  04/15/2017   IR IMAGING GUIDED PORT INSERTION  10/20/2018   LAPAROSCOPIC GASTRIC RESTRICTIVE DUODENAL PROCEDURE (DUODENAL SWITCH)     RADIOLOGY WITH ANESTHESIA N/A 04/15/2017   Procedure: LUMBER PUNCTURE MRI SCAN;  Surgeon: Radiologist, Medication, MD;  Location: [place];  Service: Radiology;  Laterality: N/A;   TEE WITHOUT CARDIOVERSION N/A 04/16/2017   Procedure: TRANSESOPHAGEAL ECHOCARDIOGRAM (TEE) WITH LOOP;  Surgeon: Josue Hector, MD;  Location: [facility] ENDOSCOPY;  Service: Cardiovascular;  Laterality: N/A;   Patient Active Problem List   Diagnosis Date Noted   TB lung, latent 09/18/2020   Poor venous access 02/28/2019   Hearing loss 02/09/2019   BMI 29.0-29.9,adult 02/01/2019   Restless leg syndrome 08/26/2018   Status post bariatric surgery 03/23/2018   Preoperative  clearance 12/28/2017   History of CVA (cerebrovascular accident) 12/23/2017   Pedal edema 08/06/2017   High risk medication use 06/05/2017   Abnormal laboratory test result 06/05/2017   Hyperlipidemia 05/25/2017   Aphasia 05/07/2017   Dysphagia    Cerebrovascular accident (CVA) due to thrombosis of precerebral artery ([place]) 04/13/2017   TIA (transient ischemic attack) 04/08/2017   Attention deficit 11/06/2016   Impaired glucose tolerance 10/31/2015   Pseudobulbar affect 02/14/2015   Insomnia 11/16/2014   Other fatigue 07/03/2014   Urinary frequency 07/03/2014   Gait disturbance 06/14/2014   Depression with anxiety 06/14/2014   Vitamin D deficiency 06/14/2014   Multiple sclerosis exacerbation ([place]) 06/04/2014   Essential hypertension 06/04/2014   Obesity 06/04/2014   Obstructive sleep apnea on CPAP 06/04/2014   Gastroesophageal reflux disease without esophagitis 06/04/2014   Relapsing remitting multiple sclerosis ([place]) 06/04/2014    ONSET DATE: 04/18/2021  REFERRING DIAG:  G35 (ICD-10-CM) - Relapsing remitting multiple sclerosis ([place])  R47.01 (ICD-10-CM) - Aphasia  R41.89 (ICD-10-CM) - Cognitive deficits    THERAPY DIAG:  Cognitive communication deficit  Cerebrovascular accident (CVA) due to thrombosis of precerebral artery ([place])  Multiple sclerosis exacerbation ([place])  Rationale for Evaluation and Treatment Rehabilitation  SUBJECTIVE: Pt pleasant, reports that she is finished painting her house  PAIN:  Are you having pain? No  OBJECTIVE:   TODAY'S TREATMENT: Skilled treatment session focused on pt's cognitive communication goals, specifically word finding strategies. Pt was Mod I throughout session with use of pause to help organize the words that she wanted to say. SLP facilitated the session by introducing use of alternate words as a word finding strategy. As such, pt engaged in categorization tasks, specifically from [facility] 8: Word finding: Microbiologist pp31. Pt required more than a reasonable amount of time and Min A to complete.   Finally, the Communication Effectiveness Scale was administered. The Communication Effectiveness Survey is a patient-reported outcome measure in which the patient rates their own effectiveness in different communication situations. A higher score indicates greater effectiveness. Pt's self-rating was 13/32 - of note pt with baseline shyness and prior to CVA she would not have struck up conversations with strangers.   PATIENT EDUCATION: Education details: ST POC Person educated: Patient Education method: Explanation, Demonstration, and Verbal cues Education comprehension: verbalized understanding   SLP Short Term Goals -       SLP SHORT TERM GOAL #1   Title Pt will be Mod I with use of word finding strategies to improve ability to effectively communicate semi-complex ideas/thoughts.    Baseline goal met during reporting period, goal upgraded to reflect progress    Time 10    Period --   sessions   Status Revised      SLP SHORT TERM GOAL #2   Title With minimal assistance, pt will generate list of 10 items within complex category across 5 sessions.    Baseline moderate assistance    Time 10    Period --   sessions   Status Partially Met      SLP SHORT TERM GOAL #3   Title With minimal assistance, pt will use speech intelligibility strategies to achieve 90% speech intelligibility at the sentence level.    Status Achieved              SLP Long Term Goals -       SLP LONG TERM GOAL #1   Title Pt will use word finding strategies to convey semi-complex information and thoughts.    Time 12    Period Weeks    Status Partially Met      SLP LONG TERM GOAL #2   Title Pt will utilize speech intelligibility strategies to achieve > 95% speech intelligibility at the conversation level.    Status Achieved              Plan -     Clinical Impression Statement Pt continues to  eagerly implement all strategies as well as overall suggestions. For example, pt made an appt with the [employer] to have them install a fire alarm that is profoundly loud for hearing impaired persons. While pt is using a "pause" as her primary word finding strategy, this results in fragmented conversation. Additional word finding strategies would greatly benefit pt.    Speech Therapy Frequency 2x / week    Duration 12 weeks    Treatment/Interventions Language facilitation;Functional tasks;SLP instruction and feedback;Patient/family education;Compensatory strategies;Internal/external aids    Potential to Achieve Goals Good    Consulted and Agree with Plan of Care Patient             Sabri Teal B. Rutherford Nail M.S., CCC-SLP, [place] Pathologist Certified Brain Injury [place]  [facility] Office [phone] Ascom [phone] Fax [phone]   Rachel Gilbert, [place] 07/24/2021, 9:46  PM   

## 2021-07-25 ENCOUNTER — Encounter: Payer: Medicare HMO | Admitting: Speech Pathology

## 2021-07-25 DIAGNOSIS — G35 Multiple sclerosis: Secondary | ICD-10-CM | POA: Diagnosis not present

## 2021-08-02 DIAGNOSIS — G4733 Obstructive sleep apnea (adult) (pediatric): Secondary | ICD-10-CM | POA: Diagnosis not present

## 2021-08-02 DIAGNOSIS — I10 Essential (primary) hypertension: Secondary | ICD-10-CM | POA: Diagnosis not present

## 2021-08-06 ENCOUNTER — Ambulatory Visit: Payer: Medicare HMO | Attending: Family Medicine | Admitting: Speech Pathology

## 2021-08-06 DIAGNOSIS — I6300 Cerebral infarction due to thrombosis of unspecified precerebral artery: Secondary | ICD-10-CM | POA: Diagnosis not present

## 2021-08-06 DIAGNOSIS — R41841 Cognitive communication deficit: Secondary | ICD-10-CM | POA: Insufficient documentation

## 2021-08-06 DIAGNOSIS — G35 Multiple sclerosis: Secondary | ICD-10-CM | POA: Insufficient documentation

## 2021-08-06 NOTE — Therapy (Signed)
OUTPATIENT SPEECH LANGUAGE PATHOLOGY TREATMENT NOTE  Patient Name: Rachel Gilbert MRN: [id number] DOB:04-16-1960, 61 y.o., female Today's Date: 08/06/2021  PCP: Chauncy Lean, FMP REFERRING PROVIDER: Debbora Presto, NP   End of Session - 08/06/21 0949     Visit Number 11    Number of Visits 25    Date for SLP Re-Evaluation 10/16/21    Authorization Type [employer]na Medicare HMO/PPO    Authorization Time Period 07/23/2021 thru 10/16/2021    Authorization - Visit Number 11    Progress Note Due on Visit 67    SLP Start Time 0955    SLP Stop Time  1045    SLP Time Calculation (min) 50 min    Activity Tolerance Patient tolerated treatment well             Past Medical History:  Diagnosis Date   Complication of anesthesia    hard time waking up    Hearing loss    Hypertension    Kidney stones    Multiple sclerosis ([place])    Sleep apnea    Uses CPAP   Stroke ([place]) 03/2017   Vision abnormalities    Past Surgical History:  Procedure Laterality Date   CESAREAN SECTION  1986   CHOLECYSTECTOMY  2000   laparoscopic   IR FLUORO GUIDED NEEDLE PLC ASPIRATION/INJECTION LOC  04/15/2017   IR IMAGING GUIDED PORT INSERTION  10/20/2018   LAPAROSCOPIC GASTRIC RESTRICTIVE DUODENAL PROCEDURE (DUODENAL SWITCH)     RADIOLOGY WITH ANESTHESIA N/A 04/15/2017   Procedure: LUMBER PUNCTURE MRI SCAN;  Surgeon: Radiologist, Medication, MD;  Location: [place];  Service: Radiology;  Laterality: N/A;   TEE WITHOUT CARDIOVERSION N/A 04/16/2017   Procedure: TRANSESOPHAGEAL ECHOCARDIOGRAM (TEE) WITH LOOP;  Surgeon: Josue Hector, MD;  Location: [facility] ENDOSCOPY;  Service: Cardiovascular;  Laterality: N/A;   Patient Active Problem List   Diagnosis Date Noted   TB lung, latent 09/18/2020   Poor venous access 02/28/2019   Hearing loss 02/09/2019   BMI 29.0-29.9,adult 02/01/2019   Restless leg syndrome 08/26/2018   Status post bariatric surgery 03/23/2018   Preoperative clearance 12/28/2017   History of CVA  (cerebrovascular accident) 12/23/2017   Pedal edema 08/06/2017   High risk medication use 06/05/2017   Abnormal laboratory test result 06/05/2017   Hyperlipidemia 05/25/2017   Aphasia 05/07/2017   Dysphagia    Cerebrovascular accident (CVA) due to thrombosis of precerebral artery ([place]) 04/13/2017   TIA (transient ischemic attack) 04/08/2017   Attention deficit 11/06/2016   Impaired glucose tolerance 10/31/2015   Pseudobulbar affect 02/14/2015   Insomnia 11/16/2014   Other fatigue 07/03/2014   Urinary frequency 07/03/2014   Gait disturbance 06/14/2014   Depression with anxiety 06/14/2014   Vitamin D deficiency 06/14/2014   Multiple sclerosis exacerbation ([place]) 06/04/2014   Essential hypertension 06/04/2014   Obesity 06/04/2014   Obstructive sleep apnea on CPAP 06/04/2014   Gastroesophageal reflux disease without esophagitis 06/04/2014   Relapsing remitting multiple sclerosis ([place]) 06/04/2014    ONSET DATE: 04/18/2021  REFERRING DIAG:  G35 (ICD-10-CM) - Relapsing remitting multiple sclerosis ([place])  R47.01 (ICD-10-CM) - Aphasia  R41.89 (ICD-10-CM) - Cognitive deficits    THERAPY DIAG:  Cognitive communication deficit  Cerebrovascular accident (CVA) due to thrombosis of precerebral artery ([place])  Multiple sclerosis exacerbation ([place])  Rationale for Evaluation and Treatment Rehabilitation  SUBJECTIVE: reports that [employer] came and set-up her fire alarm, pt brought in pictures of alarm  PAIN:  Are you having pain? No  OBJECTIVE:   TODAY'S TREATMENT: Skilled treatment session focused on pt's cognitive communication goals, specifically word finding strategies to use in moments of communication breakdown.   SLP facilitated session by introducing and instructing pt in semantic feature analysis. Words were chosen from initial conversation in this session. With moderate cues, pt able to list characteristics of object but despite maximal assistance, pt unable  to descriptive sentence using the object's features. SLP further facilitated session by providing pt with random list of people, places and things for pt to describe with enough semantic features for SLP to guess targeted word. Pt was effective in her description in 11 out of 14 opportunities but required substantially more than a reasonable time to develop description. Pt stated, "It was hard for me to think, hard to put it into sentences to explain to you."  The Communication Effectiveness Survey is a patient-reported outcome measure in which the patient rates their own effectiveness in different communication situations. A higher score indicates greater effectiveness. Pt's self-rating was 13/32 - of note pt with baseline shyness and prior to CVA she would not have struck up conversations with strangers.   PATIENT EDUCATION: Education details: ST POC Person educated: Patient Education method: Explanation, Demonstration, and Verbal cues Education comprehension: verbalized understanding   SLP Short Term Goals -       SLP SHORT TERM GOAL #1   Title Pt will be Mod I with use of word finding strategies to improve ability to effectively communicate semi-complex ideas/thoughts.    Baseline goal met during reporting period, goal upgraded to reflect progress    Time 10    Period --   sessions   Status Revised      SLP SHORT TERM GOAL #2   Title With minimal assistance, pt will generate list of 10 items within complex category across 5 sessions.    Baseline moderate assistance    Time 10    Period --   sessions   Status Partially Met      SLP SHORT TERM GOAL #3   Title With minimal assistance, pt will use speech intelligibility strategies to achieve 90% speech intelligibility at the sentence level.    Status Achieved              SLP Long Term Goals -       SLP LONG TERM GOAL #1   Title Pt will use word finding strategies to convey semi-complex information and thoughts.    Time 12     Period Weeks    Status Partially Met      SLP LONG TERM GOAL #2   Title Pt will utilize speech intelligibility strategies to achieve > 95% speech intelligibility at the conversation level.    Status Achieved              Plan -     Clinical Impression Statement Pt reports fewer emotional breakdowns. She also describes calling 911 during a distressing situation with good clarity and description of emergency. Additional word finding strategies would greatly benefit pt.    Speech Therapy Frequency 2x / week    Duration 12 weeks    Treatment/Interventions Language facilitation;Functional tasks;SLP instruction and feedback;Patient/family education;Compensatory strategies;Internal/external aids    Potential to Achieve Goals Good    Consulted and Agree with Plan of Care Patient             Montray Kliebert B. Rutherford Nail, M.S., CCC-SLP, [place] Pathologist Certified Brain Injury [place]  [place] Office [phone] Ascom [phone] Fax [phone]

## 2021-08-08 ENCOUNTER — Ambulatory Visit: Payer: Medicare HMO | Admitting: Speech Pathology

## 2021-08-08 DIAGNOSIS — R41841 Cognitive communication deficit: Secondary | ICD-10-CM | POA: Diagnosis not present

## 2021-08-08 DIAGNOSIS — I6300 Cerebral infarction due to thrombosis of unspecified precerebral artery: Secondary | ICD-10-CM

## 2021-08-08 DIAGNOSIS — G35 Multiple sclerosis: Secondary | ICD-10-CM

## 2021-08-08 NOTE — Therapy (Signed)
OUTPATIENT SPEECH LANGUAGE PATHOLOGY TREATMENT NOTE  Patient Name: Rachel Gilbert MRN: [id number] DOB:[birth year], 61 y.o., female Today's Date: 08/08/2021  PCP: Chauncy Lean, FMP REFERRING PROVIDER: Debbora Presto, NP   End of Session - 08/08/21 1330     Visit Number 12    Number of Visits 25    Date for SLP Re-Evaluation 10/16/21    Authorization Type [employer] Medicare HMO/PPO    Authorization Time Period 07/23/2021 thru 10/16/2021    Authorization - Visit Number 12    Progress Note Due on Visit 91    SLP Start Time 1000    SLP Stop Time  1100    SLP Time Calculation (min) 60 min    Activity Tolerance Patient tolerated treatment well             Past Medical History:  Diagnosis Date   Complication of anesthesia    hard time waking up    Hearing loss    Hypertension    Kidney stones    Multiple sclerosis ([place])    Sleep apnea    Uses CPAP   Stroke ([place]) 03/2017   Vision abnormalities    Past Surgical History:  Procedure Laterality Date   CESAREAN SECTION  1986   CHOLECYSTECTOMY  2000   laparoscopic   IR FLUORO GUIDED NEEDLE PLC ASPIRATION/INJECTION LOC  04/15/2017   IR IMAGING GUIDED PORT INSERTION  10/20/2018   LAPAROSCOPIC GASTRIC RESTRICTIVE DUODENAL PROCEDURE (DUODENAL SWITCH)     RADIOLOGY WITH ANESTHESIA N/A 04/15/2017   Procedure: LUMBER PUNCTURE MRI SCAN;  Surgeon: Radiologist, Medication, MD;  Location: [facility];  Service: Radiology;  Laterality: N/A;   TEE WITHOUT CARDIOVERSION N/A 04/16/2017   Procedure: TRANSESOPHAGEAL ECHOCARDIOGRAM (TEE) WITH LOOP;  Surgeon: Josue Hector, MD;  Location: [facility] ENDOSCOPY;  Service: Cardiovascular;  Laterality: N/A;   Patient Active Problem List   Diagnosis Date Noted   TB lung, latent 09/18/2020   Poor venous access 02/28/2019   Hearing loss 02/09/2019   BMI 29.0-29.9,adult 02/01/2019   Restless leg syndrome 08/26/2018   Status post bariatric surgery 03/23/2018   Preoperative clearance 12/28/2017   History of CVA  (cerebrovascular accident) 12/23/2017   Pedal edema 08/06/2017   High risk medication use 06/05/2017   Abnormal laboratory test result 06/05/2017   Hyperlipidemia 05/25/2017   Aphasia 05/07/2017   Dysphagia    Cerebrovascular accident (CVA) due to thrombosis of precerebral artery ([place]) 04/13/2017   TIA (transient ischemic attack) 04/08/2017   Attention deficit 11/06/2016   Impaired glucose tolerance 10/31/2015   Pseudobulbar affect 02/14/2015   Insomnia 11/16/2014   Other fatigue 07/03/2014   Urinary frequency 07/03/2014   Gait disturbance 06/14/2014   Depression with anxiety 06/14/2014   Vitamin D deficiency 06/14/2014   Multiple sclerosis exacerbation ([place]) 06/04/2014   Essential hypertension 06/04/2014   Obesity 06/04/2014   Obstructive sleep apnea on CPAP 06/04/2014   Gastroesophageal reflux disease without esophagitis 06/04/2014   Relapsing remitting multiple sclerosis ([place]) 06/04/2014    ONSET DATE: 04/18/2021  REFERRING DIAG:  G35 (ICD-10-CM) - Relapsing remitting multiple sclerosis ([place])  R47.01 (ICD-10-CM) - Aphasia  R41.89 (ICD-10-CM) - Cognitive deficits    THERAPY DIAG:  Cognitive communication deficit  Cerebrovascular accident (CVA) due to thrombosis of precerebral artery ([place])  Multiple sclerosis exacerbation ([place])  Rationale for Evaluation and Treatment Rehabilitation  SUBJECTIVE: pt reports that she has had a good week  PAIN:  Are you having pain? No     OBJECTIVE:   TODAY'S TREATMENT: Skilled treatment  session focused on pt's cognitive communication goals, specifically word finding strategies to use in moments of communication breakdown.   SLP facilitated session by introducing and instructing pt in semantic feature analysis. SLP further facilitated session by providing pt with random list of people, places and things for pt to describe with enough semantic features for SLP to guess targeted word. Pt was effective in her description in 18 out  of 25 opportunities and improved her response time. Great progress.   The Communication Effectiveness Survey is a patient-reported outcome measure in which the patient rates their own effectiveness in different communication situations. A higher score indicates greater effectiveness. Pt's self-rating was 13/32 - of note pt with baseline shyness and prior to CVA she would not have struck up conversations with strangers.   PATIENT EDUCATION: Education details: ST POC Person educated: Patient Education method: Explanation, Demonstration, and Verbal cues Education comprehension: verbalized understanding   SLP Short Term Goals -       SLP SHORT TERM GOAL #1   Title Pt will be Mod I with use of word finding strategies to improve ability to effectively communicate semi-complex ideas/thoughts.    Baseline goal met during reporting period, goal upgraded to reflect progress    Time 10    Period --   sessions   Status Revised      SLP SHORT TERM GOAL #2   Title With minimal assistance, pt will generate list of 10 items within complex category across 5 sessions.    Baseline moderate assistance    Time 10    Period --   sessions   Status Partially Met      SLP SHORT TERM GOAL #3   Title With minimal assistance, pt will use speech intelligibility strategies to achieve 90% speech intelligibility at the sentence level.    Status Achieved              SLP Long Term Goals -       SLP LONG TERM GOAL #1   Title Pt will use word finding strategies to convey semi-complex information and thoughts.    Time 12    Period Weeks    Status Partially Met      SLP LONG TERM GOAL #2   Title Pt will utilize speech intelligibility strategies to achieve > 95% speech intelligibility at the conversation level.    Status Achieved              Plan -     Clinical Impression Statement Pt reports fewer emotional breakdowns. Pt with increased comfort communicating with others and additional word  finding strategies would greatly benefit pt to improve community participation.   Speech Therapy Frequency 2x / week    Duration 12 weeks    Treatment/Interventions Language facilitation;Functional tasks;SLP instruction and feedback;Patient/family education;Compensatory strategies;Internal/external aids    Potential to Achieve Goals Good    Consulted and Agree with Plan of Care Patient             Marquitta Persichetti B. Rutherford Nail, M.S., CCC-SLP, Mining engineer Certified Brain Injury [place]  [place] Office [phone] Ascom [phone] Fax [phone]

## 2021-08-13 ENCOUNTER — Ambulatory Visit: Payer: Medicare HMO | Admitting: Speech Pathology

## 2021-08-13 DIAGNOSIS — R41841 Cognitive communication deficit: Secondary | ICD-10-CM

## 2021-08-13 DIAGNOSIS — G35 Multiple sclerosis: Secondary | ICD-10-CM

## 2021-08-13 DIAGNOSIS — I6300 Cerebral infarction due to thrombosis of unspecified precerebral artery: Secondary | ICD-10-CM | POA: Diagnosis not present

## 2021-08-13 NOTE — Therapy (Signed)
OUTPATIENT SPEECH LANGUAGE PATHOLOGY TREATMENT NOTE  Patient Name: Rachel Gilbert MRN: [id number] DOB:1960/03/23, 61 y.o., female Today's Date: 08/13/2021  PCP: Chauncy Lean, FMP REFERRING PROVIDER: Debbora Presto, NP   End of Session - 08/13/21 1008     Visit Number 13    Number of Visits 25    Date for SLP Re-Evaluation 10/16/21    Authorization Type [employer] Medicare HMO/PPO    Authorization Time Period 07/23/2021 thru 10/16/2021    Authorization - Visit Number 13    Progress Note Due on Visit 98    SLP Start Time 1005    SLP Stop Time  1100    SLP Time Calculation (min) 55 min    Activity Tolerance Patient tolerated treatment well             Past Medical History:  Diagnosis Date   Complication of anesthesia    hard time waking up    Hearing loss    Hypertension    Kidney stones    Multiple sclerosis ([place])    Sleep apnea    Uses CPAP   Stroke ([place]) 03/2017   Vision abnormalities    Past Surgical History:  Procedure Laterality Date   CESAREAN SECTION  1986   CHOLECYSTECTOMY  2000   laparoscopic   IR FLUORO GUIDED NEEDLE PLC ASPIRATION/INJECTION LOC  04/15/2017   IR IMAGING GUIDED PORT INSERTION  10/20/2018   LAPAROSCOPIC GASTRIC RESTRICTIVE DUODENAL PROCEDURE (DUODENAL SWITCH)     RADIOLOGY WITH ANESTHESIA N/A 04/15/2017   Procedure: LUMBER PUNCTURE MRI SCAN;  Surgeon: Radiologist, Medication, MD;  Location: [facility];  Service: Radiology;  Laterality: N/A;   TEE WITHOUT CARDIOVERSION N/A 04/16/2017   Procedure: TRANSESOPHAGEAL ECHOCARDIOGRAM (TEE) WITH LOOP;  Surgeon: Josue Hector, MD;  Location: [facility] ENDOSCOPY;  Service: Cardiovascular;  Laterality: N/A;   Patient Active Problem List   Diagnosis Date Noted   TB lung, latent 09/18/2020   Poor venous access 02/28/2019   Hearing loss 02/09/2019   BMI 29.0-29.9,adult 02/01/2019   Restless leg syndrome 08/26/2018   Status post bariatric surgery 03/23/2018   Preoperative clearance 12/28/2017   History of CVA  (cerebrovascular accident) 12/23/2017   Pedal edema 08/06/2017   High risk medication use 06/05/2017   Abnormal laboratory test result 06/05/2017   Hyperlipidemia 05/25/2017   Aphasia 05/07/2017   Dysphagia    Cerebrovascular accident (CVA) due to thrombosis of precerebral artery ([place]) 04/13/2017   TIA (transient ischemic attack) 04/08/2017   Attention deficit 11/06/2016   Impaired glucose tolerance 10/31/2015   Pseudobulbar affect 02/14/2015   Insomnia 11/16/2014   Other fatigue 07/03/2014   Urinary frequency 07/03/2014   Gait disturbance 06/14/2014   Depression with anxiety 06/14/2014   Vitamin D deficiency 06/14/2014   Multiple sclerosis exacerbation ([place]) 06/04/2014   Essential hypertension 06/04/2014   Obesity 06/04/2014   Obstructive sleep apnea on CPAP 06/04/2014   Gastroesophageal reflux disease without esophagitis 06/04/2014   Relapsing remitting multiple sclerosis ([place]) 06/04/2014    ONSET DATE: 04/18/2021  REFERRING DIAG:  G35 (ICD-10-CM) - Relapsing remitting multiple sclerosis ([place])  R47.01 (ICD-10-CM) - Aphasia  R41.89 (ICD-10-CM) - Cognitive deficits    THERAPY DIAG:  Cognitive communication deficit  Multiple sclerosis exacerbation ([place])  Cerebrovascular accident (CVA) due to thrombosis of precerebral artery ([place])  Rationale for Evaluation and Treatment Rehabilitation  SUBJECTIVE: why can I remember things that I did when I was a kid but I can't remember what happened just a little while ago  PAIN:  Are  you having pain? No     OBJECTIVE:   TODAY'S TREATMENT: Skilled treatment session focused on pt's cognitive communication goals, specifically word finding strategies to use in moments of communication breakdown.   SLP facilitated session by engaging pt in complex conversation about an unfamiliar topic to this listener. Pt required rare Min A for use of communication breakdowns x 10 in 30 minute conversation.   Pt continues to be intermittently  emotional with s/s of PBA. Pt expressed interest in pursuing medication again (medication had previously been prescribed several years ago but insurance didn't cover it). SLP assisted pt in setting up MyChart to help pt send chats to her physicians.    PATIENT EDUCATION: Education details: ST POC Person educated: Patient Education method: Explanation, Demonstration, and Verbal cues Education comprehension: verbalized understanding   SLP Short Term Goals -       SLP SHORT TERM GOAL #1   Title Pt will be Mod I with use of word finding strategies to improve ability to effectively communicate semi-complex ideas/thoughts.    Baseline goal met during reporting period, goal upgraded to reflect progress    Time 10    Period --   sessions   Status Revised      SLP SHORT TERM GOAL #2   Title With minimal assistance, pt will generate list of 10 items within complex category across 5 sessions.    Baseline moderate assistance    Time 10    Period --   sessions   Status Partially Met      SLP SHORT TERM GOAL #3   Title With minimal assistance, pt will use speech intelligibility strategies to achieve 90% speech intelligibility at the sentence level.    Status Achieved              SLP Long Term Goals -       SLP LONG TERM GOAL #1   Title Pt will use word finding strategies to convey semi-complex information and thoughts.    Time 12    Period Weeks    Status Partially Met      SLP LONG TERM GOAL #2   Title Pt will utilize speech intelligibility strategies to achieve > 95% speech intelligibility at the conversation level.    Status Achieved              Plan -     Clinical Impression Statement Pt presents with mild to occasionally moderate communication breakdowns impeding her ability to communicate complex thoughts/ideas. As such, continue to recommend skilled ST intervention to improve functional independence within her family, workplace and community.    Speech Therapy  Frequency 2x / week    Duration 12 weeks    Treatment/Interventions Language facilitation;Functional tasks;SLP instruction and feedback;Patient/family education;Compensatory strategies;Internal/external aids    Potential to Achieve Goals Good    Consulted and Agree with Plan of Care Patient             Tareva Leske B. Rutherford Nail, M.S., CCC-SLP, Mining engineer Certified Brain Injury [place]  [place] Office [phone] Ascom [phone] Fax [phone]

## 2021-08-16 ENCOUNTER — Ambulatory Visit: Payer: Medicare HMO | Admitting: Speech Pathology

## 2021-08-16 DIAGNOSIS — R41841 Cognitive communication deficit: Secondary | ICD-10-CM | POA: Diagnosis not present

## 2021-08-16 DIAGNOSIS — G35 Multiple sclerosis: Secondary | ICD-10-CM

## 2021-08-16 DIAGNOSIS — I6300 Cerebral infarction due to thrombosis of unspecified precerebral artery: Secondary | ICD-10-CM

## 2021-08-16 NOTE — Therapy (Signed)
OUTPATIENT SPEECH LANGUAGE PATHOLOGY TREATMENT NOTE  Patient Name: Rachel Gilbert MRN: [id number] DOB:1960-04-06, 61 y.o., female Today's Date: 08/16/2021  PCP: Chauncy Lean, FMP REFERRING PROVIDER: Debbora Presto, NP   End of Session - 08/16/21 0958     Visit Number 14    Number of Visits 25    Date for SLP Re-Evaluation 10/16/21    Authorization Type [employer] Medicare HMO/PPO    Authorization Time Period 07/23/2021 thru 10/16/2021    Authorization - Visit Number 14    Progress Note Due on Visit 33    SLP Start Time 0900    SLP Stop Time  0945    SLP Time Calculation (min) 45 min    Activity Tolerance Patient tolerated treatment well             Past Medical History:  Diagnosis Date   Complication of anesthesia    hard time waking up    Hearing loss    Hypertension    Kidney stones    Multiple sclerosis ([place])    Sleep apnea    Uses CPAP   Stroke ([place]) 03/2017   Vision abnormalities    Past Surgical History:  Procedure Laterality Date   CESAREAN SECTION  1986   CHOLECYSTECTOMY  2000   laparoscopic   IR FLUORO GUIDED NEEDLE PLC ASPIRATION/INJECTION LOC  04/15/2017   IR IMAGING GUIDED PORT INSERTION  10/20/2018   LAPAROSCOPIC GASTRIC RESTRICTIVE DUODENAL PROCEDURE (DUODENAL SWITCH)     RADIOLOGY WITH ANESTHESIA N/A 04/15/2017   Procedure: LUMBER PUNCTURE MRI SCAN;  Surgeon: Radiologist, Medication, MD;  Location: [facility];  Service: Radiology;  Laterality: N/A;   TEE WITHOUT CARDIOVERSION N/A 04/16/2017   Procedure: TRANSESOPHAGEAL ECHOCARDIOGRAM (TEE) WITH LOOP;  Surgeon: Josue Hector, MD;  Location: [facility] ENDOSCOPY;  Service: Cardiovascular;  Laterality: N/A;   Patient Active Problem List   Diagnosis Date Noted   TB lung, latent 09/18/2020   Poor venous access 02/28/2019   Hearing loss 02/09/2019   BMI 29.0-29.9,adult 02/01/2019   Restless leg syndrome 08/26/2018   Status post bariatric surgery 03/23/2018   Preoperative clearance 12/28/2017   History of CVA  (cerebrovascular accident) 12/23/2017   Pedal edema 08/06/2017   High risk medication use 06/05/2017   Abnormal laboratory test result 06/05/2017   Hyperlipidemia 05/25/2017   Aphasia 05/07/2017   Dysphagia    Cerebrovascular accident (CVA) due to thrombosis of precerebral artery ([place]) 04/13/2017   TIA (transient ischemic attack) 04/08/2017   Attention deficit 11/06/2016   Impaired glucose tolerance 10/31/2015   Pseudobulbar affect 02/14/2015   Insomnia 11/16/2014   Other fatigue 07/03/2014   Urinary frequency 07/03/2014   Gait disturbance 06/14/2014   Depression with anxiety 06/14/2014   Vitamin D deficiency 06/14/2014   Multiple sclerosis exacerbation ([place]) 06/04/2014   Essential hypertension 06/04/2014   Obesity 06/04/2014   Obstructive sleep apnea on CPAP 06/04/2014   Gastroesophageal reflux disease without esophagitis 06/04/2014   Relapsing remitting multiple sclerosis ([place]) 06/04/2014    ONSET DATE: 04/18/2021  REFERRING DIAG:  G35 (ICD-10-CM) - Relapsing remitting multiple sclerosis ([place])  R47.01 (ICD-10-CM) - Aphasia  R41.89 (ICD-10-CM) - Cognitive deficits    THERAPY DIAG:  Cognitive communication deficit  Multiple sclerosis exacerbation ([place])  Cerebrovascular accident (CVA) due to thrombosis of precerebral artery ([place])  Rationale for Evaluation and Treatment Rehabilitation  SUBJECTIVE: "does that look good?" Referring to slice of cheesecake that she brought to session.  PAIN:  Are you having pain? No     OBJECTIVE:  TODAY'S TREATMENT: Skilled treatment session focused on pt's cognitive communication goals, specifically word finding strategies to use in moments of communication breakdown.   Pt with 2 verbal blocks during 45 minute conversation. Despite attempts to use strategies prompted by SLP, she was not able to verbalize thru the block. Whenever when she began a new sentence about same topic, her speech was immediately fluent. Pt was also  independent with providing verbal directions to nail salon, which is incredible progress.    PATIENT EDUCATION: Education details: ST POC Person educated: Patient Education method: Explanation, Demonstration, and Verbal cues Education comprehension: verbalized understanding   SLP Short Term Goals -       SLP SHORT TERM GOAL #1   Title Pt will be Mod I with use of word finding strategies to improve ability to effectively communicate semi-complex ideas/thoughts.    Baseline goal met during reporting period, goal upgraded to reflect progress    Time 10    Period --   sessions   Status Revised      SLP SHORT TERM GOAL #2   Title With minimal assistance, pt will generate list of 10 items within complex category across 5 sessions.    Baseline moderate assistance    Time 10    Period --   sessions   Status Partially Met      SLP SHORT TERM GOAL #3   Title With minimal assistance, pt will use speech intelligibility strategies to achieve 90% speech intelligibility at the sentence level.    Status Achieved              SLP Long Term Goals -       SLP LONG TERM GOAL #1   Title Pt will use word finding strategies to convey semi-complex information and thoughts.    Time 12    Period Weeks    Status Partially Met      SLP LONG TERM GOAL #2   Title Pt will utilize speech intelligibility strategies to achieve > 95% speech intelligibility at the conversation level.    Status Achieved              Plan -     Clinical Impression Statement Pt presents with mild communication breakdowns impeding her ability to communicate complex thoughts/ideas. She continues to demonstrate great stimulability for using strategies during moments of communication breakdowns. As such, continue to recommend skilled ST intervention to improve functional independence within her family, workplace and community.    Speech Therapy Frequency 2x / week    Duration 12 weeks    Treatment/Interventions  Language facilitation;Functional tasks;SLP instruction and feedback;Patient/family education;Compensatory strategies;Internal/external aids    Potential to Achieve Goals Good    Consulted and Agree with Plan of Care Patient             Trinka Keshishyan B. Rutherford Nail, M.S., CCC-SLP, Mining engineer Certified Brain Injury [place]  [place] Office [phone] Ascom [phone] Fax [phone]

## 2021-08-20 ENCOUNTER — Encounter: Payer: Medicare HMO | Admitting: Speech Pathology

## 2021-08-27 ENCOUNTER — Ambulatory Visit: Payer: Medicare HMO | Attending: Family Medicine | Admitting: Speech Pathology

## 2021-08-27 DIAGNOSIS — R41841 Cognitive communication deficit: Secondary | ICD-10-CM | POA: Insufficient documentation

## 2021-08-27 DIAGNOSIS — G35 Multiple sclerosis: Secondary | ICD-10-CM | POA: Insufficient documentation

## 2021-08-27 DIAGNOSIS — I6300 Cerebral infarction due to thrombosis of unspecified precerebral artery: Secondary | ICD-10-CM | POA: Insufficient documentation

## 2021-08-27 NOTE — Therapy (Signed)
OUTPATIENT SPEECH LANGUAGE PATHOLOGY TREATMENT NOTE  Patient Name: Rachel Gilbert MRN: [id number] DOB:Oct 04, 1960, 61 y.o., female Today's Date: 08/27/2021  PCP: Rachel Gilbert, FMP REFERRING PROVIDER: Debbora Presto, NP   End of Session - 08/27/21 0922     Visit Number 15    Number of Visits 25    Date for SLP Re-Evaluation 10/16/21    Authorization Type [employer] Medicare HMO/PPO    Authorization Time Period 07/23/2021 thru 10/16/2021    Authorization - Visit Number 15    Progress Note Due on Visit 60    SLP Start Time 0900    SLP Stop Time  1000    SLP Time Calculation (min) 60 min    Activity Tolerance Patient tolerated treatment well             Past Medical History:  Diagnosis Date   Complication of anesthesia    hard time waking up    Hearing loss    Hypertension    Kidney stones    Multiple sclerosis ([place])    Sleep apnea    Uses CPAP   Stroke ([place]) 03/2017   Vision abnormalities    Past Surgical History:  Procedure Laterality Date   CESAREAN SECTION  1986   CHOLECYSTECTOMY  2000   laparoscopic   IR FLUORO GUIDED NEEDLE PLC ASPIRATION/INJECTION LOC  04/15/2017   IR IMAGING GUIDED PORT INSERTION  10/20/2018   LAPAROSCOPIC GASTRIC RESTRICTIVE DUODENAL PROCEDURE (DUODENAL SWITCH)     RADIOLOGY WITH ANESTHESIA N/A 04/15/2017   Procedure: LUMBER PUNCTURE MRI SCAN;  Surgeon: Radiologist, Medication, MD;  Location: [place];  Service: Radiology;  Laterality: N/A;   TEE WITHOUT CARDIOVERSION N/A 04/16/2017   Procedure: TRANSESOPHAGEAL ECHOCARDIOGRAM (TEE) WITH LOOP;  Surgeon: Rachel Hector, MD;  Location: [facility] ENDOSCOPY;  Service: Cardiovascular;  Laterality: N/A;   Patient Active Problem List   Diagnosis Date Noted   TB lung, latent 09/18/2020   Poor venous access 02/28/2019   Hearing loss 02/09/2019   BMI 29.0-29.9,adult 02/01/2019   Restless leg syndrome 08/26/2018   Status post bariatric surgery 03/23/2018   Preoperative clearance 12/28/2017   History of CVA  (cerebrovascular accident) 12/23/2017   Pedal edema 08/06/2017   High risk medication use 06/05/2017   Abnormal laboratory test result 06/05/2017   Hyperlipidemia 05/25/2017   Aphasia 05/07/2017   Dysphagia    Cerebrovascular accident (CVA) due to thrombosis of precerebral artery ([place]) 04/13/2017   TIA (transient ischemic attack) 04/08/2017   Attention deficit 11/06/2016   Impaired glucose tolerance 10/31/2015   Pseudobulbar affect 02/14/2015   Insomnia 11/16/2014   Other fatigue 07/03/2014   Urinary frequency 07/03/2014   Gait disturbance 06/14/2014   Depression with anxiety 06/14/2014   Vitamin D deficiency 06/14/2014   Multiple sclerosis exacerbation ([place]) 06/04/2014   Essential hypertension 06/04/2014   Obesity 06/04/2014   Obstructive sleep apnea on CPAP 06/04/2014   Gastroesophageal reflux disease without esophagitis 06/04/2014   Relapsing remitting multiple sclerosis ([place]) 06/04/2014    ONSET DATE: 04/18/2021  REFERRING DIAG:  G35 (ICD-10-CM) - Relapsing remitting multiple sclerosis ([place])  R47.01 (ICD-10-CM) - Aphasia  R41.89 (ICD-10-CM) - Cognitive deficits    THERAPY DIAG:  Cognitive communication deficit  Multiple sclerosis exacerbation ([place])  Cerebrovascular accident (CVA) due to thrombosis of precerebral artery ([place])  Rationale for Evaluation and Treatment Rehabilitation  SUBJECTIVE: "I was able to get an appt with a counselor" PAIN:  Are you having pain? No     OBJECTIVE:   TODAY'S TREATMENT: Skilled treatment  session focused on pt's cognitive communication goals, specifically word finding strategies to use in moments of communication breakdown.   Verbal description tasks to aid in using alternate words or descriptions during moments of communication breakdown. Pt able to provide adequate description with Mod I use of strategies when describing 15 basic objects.    PATIENT EDUCATION: Education details: progress with verbal description Person  educated: Patient Education method: Explanation, Demonstration, and Verbal cues Education comprehension: verbalized understanding   SLP Short Term Goals -       SLP SHORT TERM GOAL #1   Title Pt will be Mod I with use of word finding strategies to improve ability to effectively communicate semi-complex ideas/thoughts.    Baseline goal met during reporting period, goal upgraded to reflect progress    Time 10    Period --   sessions   Status Revised      SLP SHORT TERM GOAL #2   Title With minimal assistance, pt will generate list of 10 items within complex category across 5 sessions.    Baseline moderate assistance    Time 10    Period --   sessions   Status Partially Met      SLP SHORT TERM GOAL #3   Title With minimal assistance, pt will use speech intelligibility strategies to achieve 90% speech intelligibility at the sentence level.    Status Achieved              SLP Long Term Goals -       SLP LONG TERM GOAL #1   Title Pt will use word finding strategies to convey semi-complex information and thoughts.    Time 12    Period Weeks    Status Partially Met      SLP LONG TERM GOAL #2   Title Pt will utilize speech intelligibility strategies to achieve > 95% speech intelligibility at the conversation level.    Status Achieved              Plan -     Clinical Impression Statement Pt presents with mild communication breakdowns impeding her ability to communicate complex thoughts/ideas. Today, she demonstrated improved ability to verbally describe objects. As such, continue to recommend skilled ST intervention to improve functional independence within her family, workplace and community.    Speech Therapy Frequency 2x / week    Duration 12 weeks    Treatment/Interventions Language facilitation;Functional tasks;SLP instruction and feedback;Patient/family education;Compensatory strategies;Internal/external aids    Potential to Achieve Goals Good    Consulted and  Agree with Plan of Care Patient             Rachel Gilbert, M.S., CCC-SLP, Mining engineer Certified Brain Injury [place]  [place] Office [phone] Ascom [phone] Fax [phone]

## 2021-08-29 ENCOUNTER — Ambulatory Visit: Payer: Medicare HMO | Admitting: Speech Pathology

## 2021-08-29 DIAGNOSIS — R41841 Cognitive communication deficit: Secondary | ICD-10-CM

## 2021-08-29 DIAGNOSIS — G35 Multiple sclerosis: Secondary | ICD-10-CM

## 2021-08-29 DIAGNOSIS — I6300 Cerebral infarction due to thrombosis of unspecified precerebral artery: Secondary | ICD-10-CM | POA: Diagnosis not present

## 2021-08-29 NOTE — Therapy (Signed)
OUTPATIENT SPEECH LANGUAGE PATHOLOGY  DISCHARGE SUMMARY TREATMENT NOTE  Patient Name: Rachel Gilbert MRN: [id number] DOB:07/03/1960, 60 y.o., female Today's Date: 08/29/2021  PCP: Marcia Ballard, FMP REFERRING PROVIDER: Amy Lomax, NP   End of Session - 08/29/21 0903     Visit Number 16    Number of Visits 25    Date for SLP Re-Evaluation 10/16/21    Authorization Type [employer] Medicare HMO/PPO    Authorization Time Period 07/23/2021 thru 10/16/2021    Authorization - Visit Number 16    Progress Note Due on Visit 20    SLP Start Time 0845    SLP Stop Time  0930    SLP Time Calculation (min) 45 min    Activity Tolerance Patient tolerated treatment well             Past Medical History:  Diagnosis Date   Complication of anesthesia    hard time waking up    Hearing loss    Hypertension    Kidney stones    Multiple sclerosis ([facility])    Sleep apnea    Uses CPAP   Stroke ([facility]) 03/2017   Vision abnormalities    Past Surgical History:  Procedure Laterality Date   CESAREAN SECTION  1986   CHOLECYSTECTOMY  2000   laparoscopic   IR FLUORO GUIDED NEEDLE PLC ASPIRATION/INJECTION LOC  04/15/2017   IR IMAGING GUIDED PORT INSERTION  10/20/2018   LAPAROSCOPIC GASTRIC RESTRICTIVE DUODENAL PROCEDURE (DUODENAL SWITCH)     RADIOLOGY WITH ANESTHESIA N/A 04/15/2017   Procedure: LUMBER PUNCTURE MRI SCAN;  Surgeon: Radiologist, Medication, MD;  Location: [facility] OR;  Service: Radiology;  Laterality: N/A;   TEE WITHOUT CARDIOVERSION N/A 04/16/2017   Procedure: TRANSESOPHAGEAL ECHOCARDIOGRAM (TEE) WITH LOOP;  Surgeon: Nishan, Peter C, MD;  Location: [facility] ENDOSCOPY;  Service: Cardiovascular;  Laterality: N/A;   Patient Active Problem List   Diagnosis Date Noted   TB lung, latent 09/18/2020   Poor venous access 02/28/2019   Hearing loss 02/09/2019   BMI 29.0-29.9,adult 02/01/2019   Restless leg syndrome 08/26/2018   Status post bariatric surgery 03/23/2018   Preoperative clearance 12/28/2017    History of CVA (cerebrovascular accident) 12/23/2017   Pedal edema 08/06/2017   High risk medication use 06/05/2017   Abnormal laboratory test result 06/05/2017   Hyperlipidemia 05/25/2017   Aphasia 05/07/2017   Dysphagia    Cerebrovascular accident (CVA) due to thrombosis of precerebral artery ([facility]) 04/13/2017   TIA (transient ischemic attack) 04/08/2017   Attention deficit 11/06/2016   Impaired glucose tolerance 10/31/2015   Pseudobulbar affect 02/14/2015   Insomnia 11/16/2014   Other fatigue 07/03/2014   Urinary frequency 07/03/2014   Gait disturbance 06/14/2014   Depression with anxiety 06/14/2014   Vitamin D deficiency 06/14/2014   Multiple sclerosis exacerbation ([facility]) 06/04/2014   Essential hypertension 06/04/2014   Obesity 06/04/2014   Obstructive sleep apnea on CPAP 06/04/2014   Gastroesophageal reflux disease without esophagitis 06/04/2014   Relapsing remitting multiple sclerosis ([facility]) 06/04/2014    ONSET DATE: 04/18/2021  REFERRING DIAG:  G35 (ICD-10-CM) - Relapsing remitting multiple sclerosis ([facility])  R47.01 (ICD-10-CM) - Aphasia  R41.89 (ICD-10-CM) - Cognitive deficits    THERAPY DIAG:  Cognitive communication deficit  Multiple sclerosis exacerbation ([facility])  Cerebrovascular accident (CVA) due to thrombosis of precerebral artery ([facility])  Rationale for Evaluation and Treatment Rehabilitation  SUBJECTIVE: "I really liked the counselor" PAIN:  Are you having pain? No   OBJECTIVE:   TODAY'S TREATMENT: Skilled treatment session focused on pt's   cognitive communication goals, specifically word finding strategies to use in moments of communication breakdown.   Verbal description tasks to aid in using alternate words or descriptions during moments of communication breakdown. Pt able to provide adequate description with Mod I use of strategies when describing 15 words with constraints for words she couldn't use in her description.    PATIENT EDUCATION: Education  details: progress with verbal description; goals met Person educated: Patient Education method: Explanation, Demonstration, and Verbal cues Education comprehension: verbalized understanding   SLP Short Term Goals -       SLP SHORT TERM GOAL #1   Title Pt will be Mod I with use of word finding strategies to improve ability to effectively communicate semi-complex ideas/thoughts.    Baseline goal met during reporting period, goal upgraded to reflect progress    Time 10    Period --   sessions   Status Met     SLP SHORT TERM GOAL #2   Title With minimal assistance, pt will generate list of 10 items within complex category across 5 sessions.    Baseline moderate assistance    Time 10    Period --   sessions   Status Met      SLP SHORT TERM GOAL #3   Title With minimal assistance, pt will use speech intelligibility strategies to achieve 90% speech intelligibility at the sentence level.    Status Achieved              SLP Long Term Goals -       SLP LONG TERM GOAL #1   Title Pt will use word finding strategies to convey semi-complex information and thoughts.    Time 12    Period Weeks    Status Met     SLP LONG TERM GOAL #2   Title Pt will utilize speech intelligibility strategies to achieve > 95% speech intelligibility at the conversation level.    Status Achieved              Plan -     Clinical Impression Statement Pt is Mod I for use of word finding strategies. As a result, pt is experiencing fewer communication breakdowns. Pt reports improved functional independence within her family, workplace and community. At this time, she is appropriate for discharge from                    [facility] and Agree with Plan of Care Patient             Bernadetta Roell B. Rutherford Nail, M.S., CCC-SLP, Mining engineer Certified Brain Injury [place]  [place] Office [phone] Ascom [phone] Fax  [phone]

## 2021-09-02 DIAGNOSIS — I10 Essential (primary) hypertension: Secondary | ICD-10-CM | POA: Diagnosis not present

## 2021-09-02 DIAGNOSIS — G4733 Obstructive sleep apnea (adult) (pediatric): Secondary | ICD-10-CM | POA: Diagnosis not present

## 2021-09-03 ENCOUNTER — Ambulatory Visit: Payer: Medicare HMO | Admitting: Speech Pathology

## 2021-09-05 DIAGNOSIS — G35 Multiple sclerosis: Secondary | ICD-10-CM | POA: Diagnosis not present

## 2021-09-10 ENCOUNTER — Encounter: Payer: Medicare HMO | Admitting: Speech Pathology

## 2021-09-12 ENCOUNTER — Encounter: Payer: Medicare HMO | Admitting: Speech Pathology

## 2021-09-17 ENCOUNTER — Encounter: Payer: Medicare HMO | Admitting: Speech Pathology

## 2021-09-20 ENCOUNTER — Encounter: Payer: Medicare HMO | Admitting: Speech Pathology

## 2021-09-24 ENCOUNTER — Encounter: Payer: Medicare HMO | Admitting: Speech Pathology

## 2021-09-26 ENCOUNTER — Encounter: Payer: Medicare HMO | Admitting: Speech Pathology

## 2021-10-02 DIAGNOSIS — G4733 Obstructive sleep apnea (adult) (pediatric): Secondary | ICD-10-CM | POA: Diagnosis not present

## 2021-10-02 DIAGNOSIS — I10 Essential (primary) hypertension: Secondary | ICD-10-CM | POA: Diagnosis not present

## 2021-10-17 ENCOUNTER — Other Ambulatory Visit: Payer: Medicare HMO

## 2021-10-17 ENCOUNTER — Other Ambulatory Visit: Payer: Self-pay | Admitting: *Deleted

## 2021-10-17 DIAGNOSIS — Z79899 Other long term (current) drug therapy: Secondary | ICD-10-CM

## 2021-10-17 DIAGNOSIS — G35 Multiple sclerosis: Secondary | ICD-10-CM

## 2021-10-17 NOTE — Progress Notes (Signed)
Placed [facility] lab in [facility] lock box for routine lab pick up. Results pending. 

## 2021-10-18 LAB — CBC WITH DIFFERENTIAL/PLATELET
Basophils Absolute: 0 10*3/uL (ref 0.0–0.2)
Basos: 0 %
EOS (ABSOLUTE): 0.2 10*3/uL (ref 0.0–0.4)
Eos: 3 %
Hematocrit: 37.2 % (ref 34.0–46.6)
Hemoglobin: 12 g/dL (ref 11.1–15.9)
Immature Grans (Abs): 0 10*3/uL (ref 0.0–0.1)
Immature Granulocytes: 0 %
Lymphocytes Absolute: 2.5 10*3/uL (ref 0.7–3.1)
Lymphs: 31 %
MCH: 29.1 pg (ref 26.6–33.0)
MCHC: 32.3 g/dL (ref 31.5–35.7)
MCV: 90 fL (ref 79–97)
Monocytes Absolute: 0.6 10*3/uL (ref 0.1–0.9)
Monocytes: 8 %
Neutrophils Absolute: 4.6 10*3/uL (ref 1.4–7.0)
Neutrophils: 58 %
Platelets: 208 10*3/uL (ref 150–450)
RBC: 4.13 x10E6/uL (ref 3.77–5.28)
RDW: 13.5 % (ref 11.7–15.4)
WBC: 8 10*3/uL (ref 3.4–10.8)

## 2021-10-23 ENCOUNTER — Encounter: Payer: Self-pay | Admitting: Neurology

## 2021-10-23 ENCOUNTER — Telehealth: Payer: Self-pay | Admitting: Neurology

## 2021-10-23 NOTE — Telephone Encounter (Signed)
Patient ask Rachel Gilbert could contact for lab results. Pt. States do not understand on My Chart.

## 2021-10-23 NOTE — Telephone Encounter (Signed)
Dr Felecia Shelling has not reviewed the lab results for the patient. Sent a mychart message notifying the patient of the results being in normal range but that we will wait to hear the official report from Dr Felecia Shelling. Will check on the [facility] status.

## 2021-10-28 NOTE — Progress Notes (Signed)
[facility] ab drawn on 10/17/21 indeterminate, index: 0.22. Inhibition assay: negative. Gave to MD for review.

## 2021-10-29 ENCOUNTER — Encounter: Payer: Self-pay | Admitting: Neurology

## 2021-10-29 ENCOUNTER — Ambulatory Visit: Payer: Medicare HMO | Admitting: Neurology

## 2021-10-29 VITALS — BP 147/87 | HR 70 | Ht 69.0 in | Wt 209.5 lb

## 2021-10-29 DIAGNOSIS — Z95828 Presence of other vascular implants and grafts: Secondary | ICD-10-CM | POA: Diagnosis not present

## 2021-10-29 DIAGNOSIS — F418 Other specified anxiety disorders: Secondary | ICD-10-CM | POA: Diagnosis not present

## 2021-10-29 DIAGNOSIS — R4701 Aphasia: Secondary | ICD-10-CM

## 2021-10-29 DIAGNOSIS — I6300 Cerebral infarction due to thrombosis of unspecified precerebral artery: Secondary | ICD-10-CM | POA: Diagnosis not present

## 2021-10-29 DIAGNOSIS — I69320 Aphasia following cerebral infarction: Secondary | ICD-10-CM

## 2021-10-29 DIAGNOSIS — G4733 Obstructive sleep apnea (adult) (pediatric): Secondary | ICD-10-CM

## 2021-10-29 DIAGNOSIS — G35 Multiple sclerosis: Secondary | ICD-10-CM | POA: Diagnosis not present

## 2021-10-29 DIAGNOSIS — R269 Unspecified abnormalities of gait and mobility: Secondary | ICD-10-CM

## 2021-10-29 NOTE — Progress Notes (Addendum)
[facility]  PATIENT: Rachel Gilbert DOB: 20-May-1960  REFERRING DOCTOR OR PCP:  ER  No PCP SOURCE: ER notes, images on PACS, patient  _________________________________   HISTORICAL  CHIEF COMPLAINT:  Chief Complaint  Patient presents with   Follow-up    Pt in room #2 and alone. Pt is on Tysabri for MS.    HISTORY OF PRESENT ILLNESS:  Rachel Gilbert is 61 y.o. woman with relapsing remitting MS and OSA.    Update 10/29/2021:  She is on Tysabri 300 mg q 6 weeks and tolerates it well.   She was JCV Ab negative (10/17/2021) She had one 02/2020 test when JCV Ab was 0.26 with positive reflex   We discussed risk would be 1:2000 or so. We changed the dosing interval to q 6 weeks.    Her gait is doing well with some stumbles but no falls.   She recently hit her calf and bruised it.   Legs are symmetric and she noted her muscles fatigue easily   She has tingling frequently in legs. She no longer takes gabapentin..  She has urinary frequency but also has hesitancy and she sees urology. Sheno longe takes oxybutynin.   No recent UTI's .   She has fatigue many days.    She sleeps well most nights.  She uses CPAP. Download showed 100% of the nights for 4+ hours and her efficacy is excellent (AHI = 0.9).   Methylphenidate helps fatigue some but she is not taking as current job takes less focus.    She denies current depression/anxiety and is on  Lexapro.   But is very emotional today (anniversary of her fiance's death 7 years ago) .  She is noting mild cognitive issues, word finding, with reduced short term memory.  Methylphenidate helps some but not currently taking.    She had the duodenal switch bariatric procedure in 2020 and has lost 80 pounds but gained about 10-20 pounds back..  She is trying to do chair yoga and other exercise.    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       She had a stroke 2019 and had some aphasia  EPWORTH SLEEPINESS  SCALE  On a scale of 0 - 3 what is the chance of dozing:  Sitting and Reading: Watching TV:      2 Sitting inactive in a public place: Passenger in car for one hour: Lying down to rest in the afternoon:  3 Sitting and talking to someone: Sitting quietly after lunch: In a car, stopped in traffic:  Total (out of 24):    5/24   Stroke History: She was hospitalized at [facility] between 04/12/2017 and 04/21/2017 due to aphasia due to a left MCA distribution stroke.   Additionally, CT angiogram appears to show occlusion of 1 of the MCA branches.     She has been placed on aspirin and then Plavix was added.  She had a TEE but no evidence of a PFO or other source of emboli.  Doppler study showed less than 50% stenosis.  A loop recorder is being considered.    Risk factors:   HTN (on 3 med's), OSA, no DM, no smoking, lipids ok (though Lipitor was added).   .     MS history: She was diagnosed in September 2012 after presenting with diplopia. She was hospitalized and received 5 days of IV steroids and she had MRI and lumbar puncture.  She recalls that the lumbar puncture was not consistent with MS but the MRI was.  She was placed on Gilenya in December 2012. She was able to return to work and the diplopia completely resolved. Then in July 2014 she had another exacerbation with visual disturbance bilaterally, gait changes and cognitive changes.  Reportedly, there was a new lesion in the brainstem and also in the spinal cord at that time. She received 5 more days of steroid.    In early May 2016, she had the onset of several symptoms including worse cognitive problems, worse gait problems and weakness in the hands.   She went to the [facility] on 06/03/2014 and she was evaluated. An admitted and received 3 days of IV Solu-Medrol.The MRI of the brain shows many white matter lesions. Some are periventricular with an appearance consistent with MS. She also has deep white  matter subcortical and juxtacortical foci. Some of these look more consistent with small vessel ischemic changes and most more consistent with multiple sclerosis. She does have new foci in the left vermis of the cerebellum and in the left frontal periventricular region..   She has been po Tysabri since 2016.  Images MRI 09/24/2018:   She has multiple T2/flair hyperintense foci in the periventricular, juxtacortical, subcortical and deep white matter.  There are also foci in the pons and left thalamus.  Some of the foci in the left frontal lobe likely represent the sequela of the acute stroke noted on the 04/15/2017 MRI.  Other foci are more likely to represent demyelination from MS.  None of the foci appear to be acute and there are no new lesions compared to the 2019 study.  Also has mild chronic sphenoid sinusitis.  MRI of the brain 06/05/2014 showed suspected active demyelinating plaque in the left corpus callosum and left vermis  MRI of the cervical spine 06/05/2014 showed a normal spinal cord.   REVIEW OF SYSTEMS: Constitutional: No fevers, chills, sweats, or change in appetite.   She has fatigue Eyes: No visual changes, double vision, eye pain Ear, nose and throat: No hearing loss, ear pain, nasal congestion, sore throat Cardiovascular: No chest pain, palpitations Respiratory:  No shortness of breath at rest or with exertion.   No wheezes GastrointestinaI: No nausea, vomiting, diarrhea, abdominal pain, fecal incontinence Genitourinary:  No dysuria, urinary retention.   Frequency better on medications. Musculoskeletal:  No neck pain, back pain Integumentary: No rash, pruritus, skin lesions Neurological: as above Psychiatric: No depression at this time.  No anxiety Endocrine: No palpitations, diaphoresis, change in appetite, change in weigh or increased thirst Hematologic/Lymphatic:  No anemia, purpura, petechiae. Allergic/Immunologic: No itchy/runny eyes, nasal congestion, recent allergic  reactions, rashes  ALLERGIES: Allergies  Allergen Reactions   Darvon [Propoxyphene] Anaphylaxis and Other (See Comments)    Throat swelling   Oxycodone Anaphylaxis   Percocet [Oxycodone-Acetaminophen] Anaphylaxis    HOME MEDICATIONS:  Current Outpatient Medications:    ALPRAZolam (XANAX) 0.5 MG tablet, Take 0.5 mg by mouth 2 (two) times daily as needed for anxiety., Disp: , Rfl:    aspirin EC 81 MG EC tablet, Take 1 tablet (81 mg total) by mouth daily., Disp: 30 tablet, Rfl: 0   atorvastatin (LIPITOR) 80 MG tablet, Take 1 tablet (80 mg total) by mouth daily at 6 PM., Disp: 30 tablet, Rfl: 0   Calcium-Vitamin D (CALTRATE 600 PLUS-VIT D PO), Take 4 tablets by mouth daily., Disp: , Rfl:    carvedilol (COREG) 25 MG tablet,  Take 25 mg by mouth 2 (two) times daily with a meal., Disp: , Rfl:    clopidogrel (PLAVIX) 75 MG tablet, Take 1 tablet by mouth once daily, Disp: 90 tablet, Rfl: 0   escitalopram (LEXAPRO) 20 MG tablet, Take 20 mg by mouth daily., Disp: , Rfl:    hydrALAZINE (APRESOLINE) 50 MG tablet, Take 50 mg by mouth 2 (two) times daily., Disp: , Rfl:    lidocaine-prilocaine (EMLA) cream, Apply 1 application topically as needed., Disp: 30 g, Rfl: 1   methylphenidate (RITALIN) 10 MG tablet, Take 2 pills qAM and one pill po 4 hours later, Disp: 90 tablet, Rfl: 0   Multiple Vitamins-Minerals (HAIR SKIN & NAILS ADVANCED PO), Take 1 tablet by mouth daily. , Disp: , Rfl:    natalizumab (TYSABRI) 300 MG/15ML injection, Inject 15 mLs into the vein every 28 (twenty-eight) days. , Disp: , Rfl:    ondansetron (ZOFRAN-ODT) 4 MG disintegrating tablet, Take 4 mg by mouth every 8 (eight) hours as needed for nausea or vomiting., Disp: , Rfl:    potassium chloride (MICRO-K) 10 MEQ CR capsule, Take 10 mEq by mouth daily. , Disp: , Rfl:    UNABLE TO FIND, Take 2 tablets by mouth daily. Med Name: Bariatric chewable Advanced Multi EA, Disp: , Rfl:   PAST MEDICAL HISTORY: Past Medical History:  Diagnosis  Date   Complication of anesthesia    hard time waking up    Hearing loss    Hypertension    Kidney stones    Multiple sclerosis (HCC)    Sleep apnea    Uses CPAP   Stroke (HCC) 03/2017   Vision abnormalities     PAST SURGICAL HISTORY: Past Surgical History:  Procedure Laterality Date   CESAREAN SECTION  1986   CHOLECYSTECTOMY  2000   laparoscopic   IR FLUORO GUIDED NEEDLE PLC ASPIRATION/INJECTION LOC  04/15/2017   IR IMAGING GUIDED PORT INSERTION  10/20/2018   LAPAROSCOPIC GASTRIC RESTRICTIVE DUODENAL PROCEDURE (DUODENAL SWITCH)     RADIOLOGY WITH ANESTHESIA N/A 04/15/2017   Procedure: LUMBER PUNCTURE MRI SCAN;  Surgeon: Radiologist, Medication, MD;  Location: [facility] OR;  Service: Radiology;  Laterality: N/A;   TEE WITHOUT CARDIOVERSION N/A 04/16/2017   Procedure: TRANSESOPHAGEAL ECHOCARDIOGRAM (TEE) WITH LOOP;  Surgeon: Wendall Stade, MD;  Location: [facility];  Service: Cardiovascular;  Laterality: N/A;    FAMILY HISTORY: Family History  Problem Relation Age of Onset   Fibromyalgia Mother    Stroke Father    Diabetes Father    Stroke Other     SOCIAL HISTORY:  Social History   Socioeconomic History   Marital status: Single    Spouse name: Not on file   Number of children: Not on file   Years of education: Not on file   Highest education level: Not on file  Occupational History   Occupation: disabled  Tobacco Use   Smoking status: Never   Smokeless tobacco: Never  Vaping Use   Vaping Use: Never used  Substance and Sexual Activity   Alcohol use: Not Currently    Comment: Occasional   Drug use: No   Sexual activity: Not Currently    Birth control/protection: None  Other Topics Concern   Not on file  Social History Narrative   Not on file   Social Determinants of Health   Financial Resource Strain: Not on file  Food Insecurity: Not on file  Transportation Needs: Not on file  Physical Activity: Not on file  Stress:  Not on file  Social Connections: Not  on file  Intimate Partner Violence: Not on file     PHYSICAL EXAM  Vitals:   10/29/21 0928  BP: (!) 147/87  Pulse: 70  Weight: 209 lb 8 oz (95 kg)  Height: 5\' 9"  (1.753 m)    Body mass index is 30.94 kg/m.   General: The patient is well-developed and well-nourished and in no acute distress  Neurologic Exam  Mental status: The patient is alert and oriented x 3 at the time of the examination.   Good affect today.  She is alert and oriented.     Focus and attention are good today.  Minimal aphasia during conversation.  Cranial nerves: Extraocular movements are full.   Pupils react equally to light and accommodation.  Fairly symmetric color vision facial strength and sensation is normal.  Trapezius strength is normal.    Hearing was symmetric.  Motor:  Muscle bulk is normal.  Muscle tone is normal in the arms are mildly increased in the left leg.  Strength is 5/5.except 4+/5 left toe extension.   Sensory: Sensory testing shows intact touch sensation in the arms and legs  Coordination: Finger-nose-finger is performed well.  Heel-to-shin is reduced on the left.  Gait and station: Station is normal.  The gait is mildly wide with reduced stride.  No foot drop.  Tandem gait is very poor.    Romberg is negative.   Reflexes: Deep tendon reflexes are symmetric and increased in legs with spread at knees but no ankle clonus.       DIAGNOSTIC DATA (LABS, IMAGING, TESTING) - I reviewed patient records, labs, notes, testing and imaging myself where available.  Lab Results  Component Value Date   WBC 8.0 10/17/2021   HGB 12.0 10/17/2021   HCT 37.2 10/17/2021   MCV 90 10/17/2021   PLT 208 10/17/2021      Component Value Date/Time   NA 143 02/11/2019 0516   NA 143 02/24/2018 1332   K 4.4 02/11/2019 0516   CL 109 02/11/2019 0516   CO2 25 02/11/2019 0516   GLUCOSE 100 (H) 02/11/2019 0516   BUN 17 02/11/2019 0516   BUN 19 02/24/2018 1332   CREATININE 1.25 (H) 02/11/2019 0516    CALCIUM 9.0 02/11/2019 0516   PROT 6.3 (L) 02/11/2019 0516   PROT 5.9 (L) 02/24/2018 1332   ALBUMIN 4.0 02/11/2019 0516   ALBUMIN 4.3 02/24/2018 1332   AST 30 02/11/2019 0516   ALT 57 (H) 02/11/2019 0516   ALKPHOS 139 (H) 02/11/2019 0516   BILITOT 0.9 02/11/2019 0516   BILITOT 0.9 02/24/2018 1332   GFRNONAA 48 (L) 02/11/2019 0516   GFRAA 55 (L) 02/11/2019 0516      ASSESSMENT AND PLAN    1. Multiple sclerosis exacerbation (HCC)   2. Cerebrovascular accident (CVA) due to thrombosis of precerebral artery (HCC)   3. Obstructive sleep apnea on CPAP   4. Gait disturbance   5. Aphasia   6. Port-A-Cath in place   7. Depression with anxiety   8. Aphasia as late effect of cerebrovascular accident       1.   Continue Tysabri..   JCV antibody was recently is negative .  However, one was low positive in 2022  .  Therefore we switched her to every 6 weeks. Check MRI 2.   Continue plavix plus aspirin for stroke.  CT angiogram had shown moderate stenosis of the right PCA 3.   Stay active and  exercise as tolerated.     4.  Continue CPAP at current settings.   She has lost some weight we may want to consider doing a home sleep study at some point to see if CPAP is still needed 5.   She will return in 6 months for f/u with me or call sooner if problems    42-minute office visit with the majority of the time spent face-to-face for history and physical, discussion/counseling and decision-making.  Additional time with record review and documentation.  Sem Mccaughey A. Epimenio Foot, MD, PhD, FAAN Certified in Neurology, Clinical Neurophysiology, Sleep Medicine, Pain Medicine and Neuroimaging Director, [facility] at [facility]  [facility] [address] [place], [place] [address] [phone]

## 2021-10-30 ENCOUNTER — Telehealth: Payer: Self-pay | Admitting: Neurology

## 2021-10-30 NOTE — Telephone Encounter (Signed)
[employer] medicare sent to [facility] they obtain auth  

## 2021-10-31 ENCOUNTER — Ambulatory Visit: Payer: Medicare HMO | Admitting: Neurology

## 2021-11-01 DIAGNOSIS — G4733 Obstructive sleep apnea (adult) (pediatric): Secondary | ICD-10-CM | POA: Diagnosis not present

## 2021-11-01 DIAGNOSIS — I10 Essential (primary) hypertension: Secondary | ICD-10-CM | POA: Diagnosis not present

## 2021-11-11 ENCOUNTER — Telehealth: Payer: Self-pay | Admitting: *Deleted

## 2021-11-11 NOTE — Telephone Encounter (Signed)
JCV ab drawn on 10/17/21 indeterminate, index: 0.22. Inhibition assay: negative.

## 2021-11-16 ENCOUNTER — Ambulatory Visit
Admission: RE | Admit: 2021-11-16 | Discharge: 2021-11-16 | Disposition: A | Discharge status: Home / Self Care | Payer: Medicare HMO | Source: Ambulatory Visit | Attending: Neurology | Admitting: Neurology

## 2021-11-16 DIAGNOSIS — R269 Unspecified abnormalities of gait and mobility: Secondary | ICD-10-CM

## 2021-11-16 DIAGNOSIS — G35 Multiple sclerosis: Secondary | ICD-10-CM

## 2021-11-16 MED ORDER — GADOPICLENOL 0.5 MMOL/ML IV SOLN
10.0000 mL | Freq: Once | INTRAVENOUS | Status: AC | PRN
  Administered 2021-11-16: 10 mL via INTRAVENOUS

## 2021-11-28 DIAGNOSIS — G35 Multiple sclerosis: Secondary | ICD-10-CM | POA: Diagnosis not present

## 2021-12-02 DIAGNOSIS — G4733 Obstructive sleep apnea (adult) (pediatric): Secondary | ICD-10-CM | POA: Diagnosis not present

## 2021-12-02 DIAGNOSIS — I10 Essential (primary) hypertension: Secondary | ICD-10-CM | POA: Diagnosis not present

## 2021-12-13 DIAGNOSIS — M25561 Pain in right knee: Secondary | ICD-10-CM | POA: Diagnosis not present

## 2021-12-31 DIAGNOSIS — G4733 Obstructive sleep apnea (adult) (pediatric): Secondary | ICD-10-CM | POA: Diagnosis not present

## 2021-12-31 DIAGNOSIS — I10 Essential (primary) hypertension: Secondary | ICD-10-CM | POA: Diagnosis not present

## 2022-01-08 DIAGNOSIS — G35 Multiple sclerosis: Secondary | ICD-10-CM | POA: Diagnosis not present

## 2022-01-14 ENCOUNTER — Telehealth: Payer: Self-pay | Admitting: Neurology

## 2022-01-14 DIAGNOSIS — G4733 Obstructive sleep apnea (adult) (pediatric): Secondary | ICD-10-CM

## 2022-01-14 NOTE — Telephone Encounter (Signed)
Called pt back. Relayed Dr. Bonnita Hollow message. She is agreeable to Rachel Gilbert. Placed order. Aware once approved via insurance, someone from sleep lab will call to schedule her.

## 2022-01-14 NOTE — Telephone Encounter (Signed)
Pt is calling. Stated her CPAP machine is reading motor life exceeded contact care provider. Pt is requesting a prescription be sent to [facility] so she can get a new machine. Pt is requesting a call back from the nurse.

## 2022-01-14 NOTE — Addendum Note (Signed)
Addended by: Arther Abbott on: 01/14/2022 01:07 PM   Modules accepted: Orders

## 2022-01-28 DIAGNOSIS — Z124 Encounter for screening for malignant neoplasm of cervix: Secondary | ICD-10-CM | POA: Diagnosis not present

## 2022-01-28 DIAGNOSIS — E559 Vitamin D deficiency, unspecified: Secondary | ICD-10-CM | POA: Diagnosis not present

## 2022-01-28 DIAGNOSIS — Z6830 Body mass index (BMI) 30.0-30.9, adult: Secondary | ICD-10-CM | POA: Diagnosis not present

## 2022-01-28 DIAGNOSIS — H900 Conductive hearing loss, bilateral: Secondary | ICD-10-CM | POA: Diagnosis not present

## 2022-01-28 DIAGNOSIS — I10 Essential (primary) hypertension: Secondary | ICD-10-CM | POA: Diagnosis not present

## 2022-01-28 DIAGNOSIS — Z713 Dietary counseling and surveillance: Secondary | ICD-10-CM | POA: Diagnosis not present

## 2022-01-28 DIAGNOSIS — Z1231 Encounter for screening mammogram for malignant neoplasm of breast: Secondary | ICD-10-CM | POA: Diagnosis not present

## 2022-01-28 DIAGNOSIS — R7302 Impaired glucose tolerance (oral): Secondary | ICD-10-CM | POA: Diagnosis not present

## 2022-01-28 DIAGNOSIS — Z7689 Persons encountering health services in other specified circumstances: Secondary | ICD-10-CM | POA: Diagnosis not present

## 2022-01-28 DIAGNOSIS — F32A Depression, unspecified: Secondary | ICD-10-CM | POA: Diagnosis not present

## 2022-01-28 DIAGNOSIS — E782 Mixed hyperlipidemia: Secondary | ICD-10-CM | POA: Diagnosis not present

## 2022-01-28 DIAGNOSIS — Z0000 Encounter for general adult medical examination without abnormal findings: Secondary | ICD-10-CM | POA: Diagnosis not present

## 2022-01-31 DIAGNOSIS — G4733 Obstructive sleep apnea (adult) (pediatric): Secondary | ICD-10-CM | POA: Diagnosis not present

## 2022-01-31 DIAGNOSIS — I10 Essential (primary) hypertension: Secondary | ICD-10-CM | POA: Diagnosis not present

## 2022-02-02 DIAGNOSIS — Z20822 Contact with and (suspected) exposure to covid-19: Secondary | ICD-10-CM | POA: Diagnosis not present

## 2022-02-02 DIAGNOSIS — U071 COVID-19: Secondary | ICD-10-CM | POA: Diagnosis not present

## 2022-02-20 ENCOUNTER — Telehealth: Payer: Self-pay | Admitting: *Deleted

## 2022-02-20 ENCOUNTER — Other Ambulatory Visit (INDEPENDENT_AMBULATORY_CARE_PROVIDER_SITE_OTHER): Payer: Self-pay

## 2022-02-20 ENCOUNTER — Other Ambulatory Visit: Payer: Self-pay | Admitting: *Deleted

## 2022-02-20 DIAGNOSIS — Z79899 Other long term (current) drug therapy: Secondary | ICD-10-CM

## 2022-02-20 DIAGNOSIS — G35 Multiple sclerosis: Secondary | ICD-10-CM

## 2022-02-20 DIAGNOSIS — Z0289 Encounter for other administrative examinations: Secondary | ICD-10-CM

## 2022-02-20 NOTE — Telephone Encounter (Signed)
JCV blood test placed in [facility] box for pick up. Results pending.   

## 2022-02-21 LAB — CBC WITH DIFFERENTIAL/PLATELET
Basophils Absolute: 0.1 10*3/uL (ref 0.0–0.2)
Basos: 1 %
EOS (ABSOLUTE): 0.2 10*3/uL (ref 0.0–0.4)
Eos: 3 %
Hematocrit: 38.1 % (ref 34.0–46.6)
Hemoglobin: 12.2 g/dL (ref 11.1–15.9)
Immature Grans (Abs): 0 10*3/uL (ref 0.0–0.1)
Immature Granulocytes: 0 %
Lymphocytes Absolute: 2.4 10*3/uL (ref 0.7–3.1)
Lymphs: 31 %
MCH: 29.4 pg (ref 26.6–33.0)
MCHC: 32 g/dL (ref 31.5–35.7)
MCV: 92 fL (ref 79–97)
Monocytes Absolute: 0.5 10*3/uL (ref 0.1–0.9)
Monocytes: 7 %
Neutrophils Absolute: 4.5 10*3/uL (ref 1.4–7.0)
Neutrophils: 58 %
Platelets: 194 10*3/uL (ref 150–450)
RBC: 4.15 x10E6/uL (ref 3.77–5.28)
RDW: 13.9 % (ref 11.7–15.4)
WBC: 7.7 10*3/uL (ref 3.4–10.8)

## 2022-03-03 DIAGNOSIS — I10 Essential (primary) hypertension: Secondary | ICD-10-CM | POA: Diagnosis not present

## 2022-03-03 DIAGNOSIS — G4733 Obstructive sleep apnea (adult) (pediatric): Secondary | ICD-10-CM | POA: Diagnosis not present

## 2022-03-03 NOTE — Telephone Encounter (Signed)
JCV ab drawn on 02/20/22 indeterminate, index: 0.33. Inhibition assay: positive. Gave to MD for review.

## 2022-03-11 ENCOUNTER — Ambulatory Visit (INDEPENDENT_AMBULATORY_CARE_PROVIDER_SITE_OTHER): Payer: Medicare HMO | Admitting: Neurology

## 2022-03-11 DIAGNOSIS — G4733 Obstructive sleep apnea (adult) (pediatric): Secondary | ICD-10-CM

## 2022-03-13 NOTE — Progress Notes (Signed)
   [facility]  HOME SLEEP STUDY  STUDY DATE: 03/11/2022 PATIENT NAME: Rachel Gilbert DOB: [birth year] MRN: [id number]  ORDERING CLINICIAN: Richard A. Felecia Shelling, MD, PhD INTERPRETING CLINICIAN: Richard A. Felecia Shelling, MD. PhD   CLINICAL INFORMATION: 62 year old woman with multiple sclerosis and obstructive sleep apnea.  She has had weight loss over the last few years.  She had severe OSA in 2009 with AHI = 38 when she had a higher BMI.       IMPRESSION:  Mild OSA with pAHI 3% = 12.6 and ODI 4% = 2.6 Normal sleep efficiency 86%. Normal nocturnal oxygenation with SaO2 nadir 91%   RECOMMENDATION: The mild OSA could be treated with continued use of CPAP.  However, with the weight loss she has had improvement and additional weight loss would likely improve the residual OSA.  An oral appliance could also be considered. Follow-up with Dr. Felecia Shelling.   INTERPRETING PHYSICIAN:   Richard A. Felecia Shelling, MD, PhD, [facility] Certified in Neurology, Clinical Neurophysiology, Sleep Medicine, Pain Medicine and Neuroimaging  [facility] [address] [place], [place] [address] [phone]

## 2022-03-14 ENCOUNTER — Encounter: Payer: Self-pay | Admitting: Neurology

## 2022-03-27 DIAGNOSIS — L538 Other specified erythematous conditions: Secondary | ICD-10-CM | POA: Diagnosis not present

## 2022-03-27 DIAGNOSIS — R58 Hemorrhage, not elsewhere classified: Secondary | ICD-10-CM | POA: Diagnosis not present

## 2022-03-27 DIAGNOSIS — L820 Inflamed seborrheic keratosis: Secondary | ICD-10-CM | POA: Diagnosis not present

## 2022-03-27 DIAGNOSIS — L578 Other skin changes due to chronic exposure to nonionizing radiation: Secondary | ICD-10-CM | POA: Diagnosis not present

## 2022-04-03 DIAGNOSIS — G35 Multiple sclerosis: Secondary | ICD-10-CM | POA: Diagnosis not present

## 2022-05-12 NOTE — Progress Notes (Unsigned)
No chief complaint on file.    HISTORY OF PRESENT ILLNESS:  05/12/22 ALL:  Rachel Gilbert is a 62 y.o. female here today for follow up for RRMS and OSA on CPAP. She continues Tysabri. Labs have been stable. JCV (0.22).  Infusions now at Q6W intervals.  MRI brain 10/2021 stable. Multiple chronic microhemorrhages in deep gray matter most consistent with hypertensive change.   She feels MS symptoms are  Gait is stable. She has waxing and waning heaviness of left leg. Using cane sometimes. No falls. Gabapentin PRN helps with RLS.   She continues methylphenidate 20mg  in mornings and 10mg  in afternoon. This helps with inattention and MS fatigue. She continues to have difficulty with aphasia and cognition since stroke. She needs more time to comprehend what others are saying. She is sleeping well. She does have some anxiety and depression. She feels symptoms are stable on escitalopram.   She continues Plavix and atorvastatin 80mg . No TIA/stroke symptoms. She is followed closely by PCP. BP has been fairly normal. She reports readings are usually 120-130/70's at home. She presents with BP log that shows normal readings.   She continues CPAP. Recent HST showed mild OSA.   HISTORY (copied from Dr Bonnita Hollow previous note)  Rachel Gilbert is 62 y.o. woman with relapsing remitting MS and OSA.     Update 10/29/2021:  She is on Tysabri 300 mg q 6 weeks and tolerates it well.   She was JCV Ab negative (10/17/2021) She had one 02/2020 test when JCV Ab was 0.26 with positive reflex   We discussed risk would be 1:2000 or so. We changed the dosing interval to q 6 weeks.     Her gait is doing well with some stumbles but no falls.   She recently hit her calf and bruised it.   Legs are symmetric and she noted her muscles fatigue easily   She has tingling frequently in legs. She no longer takes gabapentin..  She has urinary frequency but also has hesitancy and she sees urology. Sheno longe takes oxybutynin.   No recent  UTI's .    She has fatigue many days.    She sleeps well most nights.  She uses CPAP. Download showed 100% of the nights for 4+ hours and her efficacy is excellent (AHI = 0.9).   Methylphenidate helps fatigue some but she is not taking as current job takes less focus.     She denies current depression/anxiety and is on  Lexapro.   But is very emotional today (anniversary of her fiance's death 7 years ago) .  She is noting mild cognitive issues, word finding, with reduced short term memory.  Methylphenidate helps some but not currently taking.     She had the duodenal switch bariatric procedure in 2020 and has lost 80 pounds but gained about 10-20 pounds back..  She is trying to do chair yoga and other exercise.    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       She had a stroke 2019 and had some aphasia   EPWORTH SLEEPINESS SCALE   On a scale of 0 - 3 what is the chance of dozing:   Sitting and Reading: Watching TV:  2 Sitting inactive in a public place: Passenger in car for one hour: Lying down to rest in the afternoon:               3 Sitting and talking to someone: Sitting quietly after lunch: In a car, stopped in traffic:   Total (out of 24):    5/24     Stroke History: She was hospitalized at [facility] between 04/12/2017 and 04/21/2017 due to aphasia due to a left MCA distribution stroke.   Additionally, CT angiogram appears to show occlusion of 1 of the MCA branches.     She has been placed on aspirin and then Plavix was added.  She had a TEE but no evidence of a PFO or other source of emboli.  Doppler study showed less than 50% stenosis.  A loop recorder is being considered.    Risk factors:   HTN (on 3 med's), OSA, no DM, no smoking, lipids ok (though Lipitor was added).   .      MS history: She was diagnosed in September 2012 after presenting with diplopia. She was  hospitalized and received 5 days of IV steroids and she had MRI and lumbar puncture. She recalls that the lumbar puncture was not consistent with MS but the MRI was.  She was placed on Gilenya in December 2012. She was able to return to work and the diplopia completely resolved. Then in July 2014 she had another exacerbation with visual disturbance bilaterally, gait changes and cognitive changes.  Reportedly, there was a new lesion in the brainstem and also in the spinal cord at that time. She received 5 more days of steroid.    In early May 2016, she had the onset of several symptoms including worse cognitive problems, worse gait problems and weakness in the hands.   She went to the [facility] emergency room on 06/03/2014 and she was evaluated. An admitted and received 3 days of IV Solu-Medrol.The MRI of the brain shows many white matter lesions. Some are periventricular with an appearance consistent with MS. She also has deep white matter subcortical and juxtacortical foci. Some of these look more consistent with small vessel ischemic changes and most more consistent with multiple sclerosis. She does have new foci in the left vermis of the cerebellum and in the left frontal periventricular region..   She has been po Tysabri since 2016.   Images MRI 09/24/2018:   She has multiple T2/flair hyperintense foci in the periventricular, juxtacortical, subcortical and deep white matter.  There are also foci in the pons and left thalamus.  Some of the foci in the left frontal lobe likely represent the sequela of the acute stroke noted on the 04/15/2017 MRI.  Other foci are more likely to represent demyelination from MS.  None of the foci appear to be acute and there are no new lesions compared to the 2019 study.  Also has mild chronic sphenoid sinusitis.   MRI of the brain 06/05/2014 showed suspected active demyelinating plaque in the left corpus callosum and left vermis   MRI of the cervical spine 06/05/2014 showed  a normal spinal cord.    REVIEW OF SYSTEMS: Out of a complete 14 system review of symptoms, the patient complains only of the following symptoms, aphasia, fatigue, anxiety, lower extremity weakness/heaviness, RLS and all other reviewed systems are negative.    ALLERGIES: Allergies  Allergen Reactions   Darvon [Propoxyphene] Anaphylaxis and Other (See Comments)    Throat swelling  Oxycodone Anaphylaxis   Percocet [Oxycodone-Acetaminophen] Anaphylaxis     HOME MEDICATIONS: Outpatient Medications Prior to Visit  Medication Sig Dispense Refill   aspirin EC 81 MG EC tablet Take 1 tablet (81 mg total) by mouth daily. 30 tablet 0   atorvastatin (LIPITOR) 80 MG tablet Take 1 tablet (80 mg total) by mouth daily at 6 PM. 30 tablet 0   carvedilol (COREG) 25 MG tablet Take 25 mg by mouth 2 (two) times daily with a meal.     clopidogrel (PLAVIX) 75 MG tablet Take 1 tablet by mouth once daily 90 tablet 0   escitalopram (LEXAPRO) 20 MG tablet Take 20 mg by mouth daily.     hydrALAZINE (APRESOLINE) 50 MG tablet Take 50 mg by mouth 2 (two) times daily.     lidocaine-prilocaine (EMLA) cream Apply 1 application topically as needed. 30 g 1   natalizumab (TYSABRI) 300 MG/15ML injection Inject 15 mLs into the vein every 28 (twenty-eight) days.      No facility-administered medications prior to visit.     PAST MEDICAL HISTORY: Past Medical History:  Diagnosis Date   Complication of anesthesia    hard time waking up    Hearing loss    Hypertension    Kidney stones    Multiple sclerosis (HCC)    Sleep apnea    Uses CPAP   Stroke (HCC) 03/2017   Vision abnormalities      PAST SURGICAL HISTORY: Past Surgical History:  Procedure Laterality Date   CESAREAN SECTION  1986   CHOLECYSTECTOMY  2000   laparoscopic   IR FLUORO GUIDED NEEDLE PLC ASPIRATION/INJECTION LOC  04/15/2017   IR IMAGING GUIDED PORT INSERTION  10/20/2018   LAPAROSCOPIC GASTRIC RESTRICTIVE DUODENAL PROCEDURE (DUODENAL  SWITCH)     RADIOLOGY WITH ANESTHESIA N/A 04/15/2017   Procedure: LUMBER PUNCTURE MRI SCAN;  Surgeon: Radiologist, Medication, MD;  Location: [facility] OR;  Service: Radiology;  Laterality: N/A;   TEE WITHOUT CARDIOVERSION N/A 04/16/2017   Procedure: TRANSESOPHAGEAL ECHOCARDIOGRAM (TEE) WITH LOOP;  Surgeon: Wendall Stade, MD;  Location: [facility] ENDOSCOPY;  Service: Cardiovascular;  Laterality: N/A;     FAMILY HISTORY: Family History  Problem Relation Age of Onset   Fibromyalgia Mother    Stroke Father    Diabetes Father    Stroke Other      SOCIAL HISTORY: Social History   Socioeconomic History   Marital status: Single    Spouse name: Not on file   Number of children: Not on file   Years of education: Not on file   Highest education level: Not on file  Occupational History   Occupation: disabled  Tobacco Use   Smoking status: Never   Smokeless tobacco: Never  Vaping Use   Vaping Use: Never used  Substance and Sexual Activity   Alcohol use: Not Currently    Comment: Occasional   Drug use: No   Sexual activity: Not Currently    Birth control/protection: None  Other Topics Concern   Not on file  Social History Narrative   Not on file   Social Determinants of Health   Financial Resource Strain: Not on file  Food Insecurity: Not on file  Transportation Needs: Not on file  Physical Activity: Not on file  Stress: Not on file  Social Connections: Not on file  Intimate Partner Violence: Not on file      PHYSICAL EXAM  There were no vitals filed for this visit.   There is no height or weight  on file to calculate BMI.   Generalized: Well developed, in no acute distress  Cardiology: normal rate and rhythm, no murmur auscultated  Respiratory: clear to auscultation bilaterally    Neurological examination  Mentation: Alert oriented to time, place, history taking. Follows all commands. Having more difficulty with aphasia today. Decreased comprehension.  Cranial nerve  II-XII: Pupils were equal round reactive to light. Extraocular movements were full, visual field were full on confrontational test. Facial sensation and strength were normal. Head turning and shoulder shrug  were normal and symmetric. Motor: The motor testing reveals 5 over 5 strength of all 4 extremities.  Sensory: Sensory testing is intact to soft touch on all 4 extremities. No evidence of extinction is noted.  Coordination: Cerebellar testing reveals good finger-nose-finger and reduced left heel-to-shin.  Gait and station: Gait is stable without assistive device, mildly wide and arthritic, unable to  tandem  Reflexes: Deep tendon reflexes are symmetric and slightly increased in bilateral patellar     DIAGNOSTIC DATA (LABS, IMAGING, TESTING) - I reviewed patient records, labs, notes, testing and imaging myself where available.  Lab Results  Component Value Date   WBC 7.7 02/20/2022   HGB 12.2 02/20/2022   HCT 38.1 02/20/2022   MCV 92 02/20/2022   PLT 194 02/20/2022      Component Value Date/Time   NA 143 02/11/2019 0516   NA 143 02/24/2018 1332   K 4.4 02/11/2019 0516   CL 109 02/11/2019 0516   CO2 25 02/11/2019 0516   GLUCOSE 100 (H) 02/11/2019 0516   BUN 17 02/11/2019 0516   BUN 19 02/24/2018 1332   CREATININE 1.25 (H) 02/11/2019 0516   CALCIUM 9.0 02/11/2019 0516   PROT 6.3 (L) 02/11/2019 0516   PROT 5.9 (L) 02/24/2018 1332   ALBUMIN 4.0 02/11/2019 0516   ALBUMIN 4.3 02/24/2018 1332   AST 30 02/11/2019 0516   ALT 57 (H) 02/11/2019 0516   ALKPHOS 139 (H) 02/11/2019 0516   BILITOT 0.9 02/11/2019 0516   BILITOT 0.9 02/24/2018 1332   GFRNONAA 48 (L) 02/11/2019 0516   GFRAA 55 (L) 02/11/2019 0516   Lab Results  Component Value Date   CHOL 156 04/09/2017   HDL 46 04/09/2017   LDLCALC 82 04/09/2017   TRIG 142 04/09/2017   CHOLHDL 3.4 04/09/2017   Lab Results  Component Value Date   HGBA1C 5.5 04/09/2017   No results found for: "VITAMINB12" Lab Results   Component Value Date   TSH 1.130 11/06/2016        No data to display               No data to display           ASSESSMENT AND PLAN  62 y.o. year old female  has a past medical history of Complication of anesthesia, Hearing loss, Hypertension, Kidney stones, Multiple sclerosis (HCC), Sleep apnea, Stroke (HCC) (03/2017), and Vision abnormalities. here with   No diagnosis found.  Ms. Tribett is doing well, today. MS symptoms are stable. No new or worsening stroke symptoms. We will continue Tysabri. She is scheduled for 6 week infusions. Will recheck blood work. She will continue methylphenidate 20mg  in am and 10mg  in pm, gabapentin 600mg  QHS PRN, and Plavix 75mg  daily as prescribed. PDMP shows appropriate refills. I will send referral to ST for aphasia and cognitive deficits. She will continue CPAP therapy daily. We have discussed concerns of high air leak. She was encouraged to change out mask every 30  days. She will continue close follow up with PCP. She will keep a close eye on BP at home. Healthy lifestyle habits advised. She will follow up with Dr Epimenio Foot in 6 months, sooner if needed.    No orders of the defined types were placed in this encounter.    No orders of the defined types were placed in this encounter.   Shawnie Dapper, MSN, FNP-C 05/12/2022, 5:08 PM  [facility] [address], [address] [place], [place] [address] [phone]

## 2022-05-12 NOTE — Patient Instructions (Incomplete)
Below is our plan:  We will restart gabapentin 300mg  at bedtime. You may take nightly for 1-2 weeks. Once pain is better, try taking as needed. I will order a new CPAP machine. Once you get the new machine, call me and we will set up an initial CPAP visit in 31-90 days following set up date.   Please make sure you are staying well hydrated. I recommend 50-60 ounces daily. Well balanced diet and regular exercise encouraged. Consistent sleep schedule with 6-8 hours recommended.   Please continue follow up with care team as directed.   Follow up with Dr Epimenio Foot in 6 months   You may receive a survey regarding today's visit. I encourage you to leave honest feed back as I do use this information to improve patient care. Thank you for seeing me today!   Management of Memory Problems   There are some general things you can do to help manage your memory problems.  Your memory may not in fact recover, but by using techniques and strategies you will be able to manage your memory difficulties better.   1)  Establish a routine. Try to establish and then stick to a regular routine.  By doing this, you will get used to what to expect and you will reduce the need to rely on your memory.  Also, try to do things at the same time of day, such as taking your medication or checking your calendar first thing in the morning. Think about think that you can do as a part of a regular routine and make a list.  Then enter them into a daily planner to remind you.  This will help you establish a routine.   2)  Organize your environment. Organize your environment so that it is uncluttered.  Decrease visual stimulation.  Place everyday items such as keys or cell phone in the same place every day (ie.  Basket next to front door) Use post it notes with a brief message to yourself (ie. Turn off light, lock the door) Use labels to indicate where things go (ie. Which cupboards are for food, dishes, etc.) Keep a notepad and pen by  the telephone to take messages   3)  Memory Aids A diary or journal/notebook/daily planner Making a list (shopping list, chore list, to do list that needs to be done) Using an alarm as a reminder (kitchen timer or cell phone alarm) Using cell phone to store information (Notes, Calendar, Reminders) Calendar/White board placed in a prominent position Post-it notes   In order for memory aids to be useful, you need to have good habits.  It's no good remembering to make a note in your journal if you don't remember to look in it.  Try setting aside a certain time of day to look in journal.   4)  Improving mood and managing fatigue. There may be other factors that contribute to memory difficulties.  Factors, such as anxiety, depression and tiredness can affect memory. Regular gentle exercise can help improve your mood and give you more energy. Exercise: there are short videos created by the [employer] on Health specially for older adults: https://bit.ly/2I30q97.  Mediterranean diet: which emphasizes fruits, vegetables, whole grains, legumes, fish, and other seafood; unsaturated fats such as olive oils; and low amounts of red meat, eggs, and sweets. A variation of this, called MIND [facility] Intervention for Neurodegenerative Delay) incorporates the DASH (Dietary Approaches to Stop Hypertension) diet, which has been shown to lower high blood pressure,  a risk factor for Alzheimer's disease. More information at: ExitMarketing.de.  Aerobic exercise that improve heart health is also good for the mind.  [employer] on Aging have short videos for exercises that you can do at home: BlindWorkshop.com.pt Simple relaxation techniques may help relieve symptoms of anxiety Try to get back to completing activities or hobbies you enjoyed doing in the past. Learn to pace yourself through activities to decrease  fatigue. Find out about some local support groups where you can share experiences with others. Try and achieve 7-8 hours of sleep at night.  To get to [facility] from [facility]:  Head toward [address] on [address]. Go for 305 ft.  Then 0.06 miles  Turn right onto [address] Go for 410 ft.  Then 0.08 miles  Turn right onto [facility]. Go for 354 ft.  Then 0.07 miles  Turn right onto [address] Go for 4.7 mi.  Then 4.7 miles  Take the left exit toward [address] Go for 0.2 mi.  Then 0.2 miles  Turn left onto [address] Go for 1.1 mi.  Then 1.1 miles  Turn left onto [address] Go for 0.4 mi.  Then 0.4 miles  Turn right. Go for 148 ft.  Then 0.03 miles  Turn right. Go for 187 ft.  Then 0.04 miles  Turn left. Go for 46 ft.  Then 0.01 miles  Turn right. Go for 121 ft.  Then 0.02 miles   [address] [place], [place] [address]

## 2022-05-13 ENCOUNTER — Ambulatory Visit: Payer: Medicare HMO | Admitting: Family Medicine

## 2022-05-13 ENCOUNTER — Encounter: Payer: Self-pay | Admitting: Family Medicine

## 2022-05-13 VITALS — BP 154/93 | HR 70 | Ht 69.0 in | Wt 216.0 lb

## 2022-05-13 DIAGNOSIS — G35 Multiple sclerosis: Secondary | ICD-10-CM

## 2022-05-13 DIAGNOSIS — F418 Other specified anxiety disorders: Secondary | ICD-10-CM

## 2022-05-13 DIAGNOSIS — Z79899 Other long term (current) drug therapy: Secondary | ICD-10-CM | POA: Diagnosis not present

## 2022-05-13 DIAGNOSIS — G4733 Obstructive sleep apnea (adult) (pediatric): Secondary | ICD-10-CM

## 2022-05-13 DIAGNOSIS — R269 Unspecified abnormalities of gait and mobility: Secondary | ICD-10-CM | POA: Diagnosis not present

## 2022-05-13 DIAGNOSIS — I6300 Cerebral infarction due to thrombosis of unspecified precerebral artery: Secondary | ICD-10-CM | POA: Diagnosis not present

## 2022-05-13 MED ORDER — GABAPENTIN 300 MG PO CAPS: 300.0000 mg | ORAL_CAPSULE | Freq: Every day | ORAL | 3 refills | Status: DC

## 2022-05-15 DIAGNOSIS — G35 Multiple sclerosis: Secondary | ICD-10-CM | POA: Diagnosis not present

## 2022-05-27 ENCOUNTER — Telehealth: Payer: Self-pay | Admitting: Family Medicine

## 2022-05-27 DIAGNOSIS — G4733 Obstructive sleep apnea (adult) (pediatric): Secondary | ICD-10-CM | POA: Diagnosis not present

## 2022-05-27 NOTE — Telephone Encounter (Signed)
Called pt. Set up with new CPAP machine 05/27/2022. Scheduled initial cpap f/u for 07/01/22 at 11am w/ Dr. Epimenio Foot. She needed an appt either on Tues or Thurs.

## 2022-05-27 NOTE — Telephone Encounter (Signed)
Pt states she just got her new replacement CPAP, pt is asking if an initial CPAP f/u is required for this CPAP, please call.

## 2022-06-26 ENCOUNTER — Other Ambulatory Visit: Payer: Self-pay

## 2022-06-26 ENCOUNTER — Other Ambulatory Visit: Payer: Self-pay | Admitting: *Deleted

## 2022-06-26 ENCOUNTER — Telehealth: Payer: Self-pay | Admitting: *Deleted

## 2022-06-26 DIAGNOSIS — G35 Multiple sclerosis: Secondary | ICD-10-CM

## 2022-06-26 DIAGNOSIS — Z79899 Other long term (current) drug therapy: Secondary | ICD-10-CM | POA: Diagnosis not present

## 2022-06-26 DIAGNOSIS — G4733 Obstructive sleep apnea (adult) (pediatric): Secondary | ICD-10-CM | POA: Diagnosis not present

## 2022-06-26 NOTE — Telephone Encounter (Signed)
Placed [facility] in [facility] lock box for routine lab pick up. Results pending. 

## 2022-06-27 LAB — CBC WITH DIFFERENTIAL/PLATELET
Basophils Absolute: 0.1 10*3/uL (ref 0.0–0.2)
Basos: 1 %
EOS (ABSOLUTE): 0.2 10*3/uL (ref 0.0–0.4)
Eos: 3 %
Hematocrit: 36.1 % (ref 34.0–46.6)
Hemoglobin: 11.8 g/dL (ref 11.1–15.9)
Immature Grans (Abs): 0 10*3/uL (ref 0.0–0.1)
Immature Granulocytes: 1 %
Lymphocytes Absolute: 2.2 10*3/uL (ref 0.7–3.1)
Lymphs: 28 %
MCH: 29.1 pg (ref 26.6–33.0)
MCHC: 32.7 g/dL (ref 31.5–35.7)
MCV: 89 fL (ref 79–97)
Monocytes Absolute: 0.4 10*3/uL (ref 0.1–0.9)
Monocytes: 6 %
Neutrophils Absolute: 4.9 10*3/uL (ref 1.4–7.0)
Neutrophils: 61 %
Platelets: 196 10*3/uL (ref 150–450)
RBC: 4.06 x10E6/uL (ref 3.77–5.28)
RDW: 13 % (ref 11.7–15.4)
WBC: 7.8 10*3/uL (ref 3.4–10.8)

## 2022-07-01 ENCOUNTER — Ambulatory Visit: Payer: Medicare HMO | Admitting: Neurology

## 2022-07-01 ENCOUNTER — Encounter: Payer: Self-pay | Admitting: Neurology

## 2022-07-01 VITALS — BP 166/96 | HR 69 | Ht 69.0 in | Wt 217.0 lb

## 2022-07-01 DIAGNOSIS — F418 Other specified anxiety disorders: Secondary | ICD-10-CM | POA: Diagnosis not present

## 2022-07-01 DIAGNOSIS — G4733 Obstructive sleep apnea (adult) (pediatric): Secondary | ICD-10-CM | POA: Diagnosis not present

## 2022-07-01 DIAGNOSIS — I6300 Cerebral infarction due to thrombosis of unspecified precerebral artery: Secondary | ICD-10-CM | POA: Diagnosis not present

## 2022-07-01 DIAGNOSIS — R269 Unspecified abnormalities of gait and mobility: Secondary | ICD-10-CM | POA: Diagnosis not present

## 2022-07-01 DIAGNOSIS — G35 Multiple sclerosis: Secondary | ICD-10-CM

## 2022-07-01 DIAGNOSIS — Z79899 Other long term (current) drug therapy: Secondary | ICD-10-CM | POA: Diagnosis not present

## 2022-07-01 MED ORDER — METHYLPHENIDATE HCL 10 MG PO TABS: ORAL_TABLET | ORAL | 0 refills | Status: DC

## 2022-07-01 NOTE — Progress Notes (Signed)
[facility]  PATIENT: Rachel Gilbert DOB: 11-19-1960  REFERRING DOCTOR OR PCP:  ER  No PCP SOURCE: ER notes, images on PACS, patient  _________________________________   HISTORICAL  CHIEF COMPLAINT:  Chief Complaint  Patient presents with   Room 11    Pt is here Alone. Pt states that tings have been going good with her CPAP Machine. Pt states no dry mouth or soar throat with her CPAP Machine. Pt states that she does have mild Fatigue throughout the day. Pt states she gets som headaches. Pt states that she has fallen in May.     HISTORY OF PRESENT ILLNESS:  Rachel Gilbert is 62 y.o. woman with relapsing remitting MS and OSA.    Update 07/01/2022:  She had a bad fall 05/21/2022 while walking down a handicap ramp.  She fell and hurt her left ankle but it was not broken.   She lost consciousness a few seconds and is not sure if she tripped and fell or if she passed out and then fell.     She is on Tysabri 300 mg q 6 weeks and tolerates it well.   She was JCV Ab negative (10/17/2021) She had one 02/2020 test when JCV Ab was 0.26 with positive reflex   We discussed risk would be 1:2000 or so. We changed the dosing interval to q 6 weeks.    Her gait is doing well with some stumbles but no falls.   Legs are symmetric and she noted her muscles fatigue easily   She has tingling frequently in legs. She no longer takes gabapentin..  She has urinary frequency but also has hesitancy and she sees urology. Not on any medications.  She has 2 x nocturia.   .   She has fatigue many days.      She denies current depression/anxiety and is on  Lexapro with benefit.  She is noting mild cognitive issues, word finding, with reduced short term memory.  Methylphenidate helps some but not currently taking.    She had the duodenal switch bariatric procedure in 2020 and has lost 80 pounds but gained about 20 pounds back..  She is trying to do chair yoga and other exercise.    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       She had a stroke 2019 and had some aphasia.  She has some behavioral changes since the stroke and gets upset/angry more quickly.    She sleeps well most nights.  She uses CPAP. Download showed 100% of the nights for 4+ hours and her efficacy is excellent (AHI = 1.1).   Methylphenidate helps fatigue some but bp was a little elevated   EPWORTH SLEEPINESS SCALE  On a scale of 0 - 3 what is the chance of dozing:  Sitting and Reading:    1 Watching TV:      3 Sitting inactive in a public place:  1 Passenger in car for one hour:  2 Lying down to rest in the afternoon:  3 Sitting and talking to someone: Sitting quietly after lunch: In a car, stopped in traffic:  Total (out of 24):    10/24   Stroke History: She was hospitalized at [facility] between 04/12/2017 and 04/21/2017 due to aphasia due to a left MCA distribution stroke.   Additionally, CT angiogram appears to show occlusion of 1 of the MCA branches.     She has been placed  on aspirin and then Plavix was added.  She had a TEE but no evidence of a PFO or other source of emboli.  Doppler study showed less than 50% stenosis.  A loop recorder is being considered.    Risk factors:   HTN (on 3 med's), OSA, no DM, no smoking, lipids ok (though Lipitor was added).   .     MS history: She was diagnosed in September 2012 after presenting with diplopia. She was hospitalized and received 5 days of IV steroids and she had MRI and lumbar puncture. She recalls that the lumbar puncture was not consistent with MS but the MRI was.  She was placed on Gilenya in December 2012. She was able to return to work and the diplopia completely resolved. Then in July 2014 she had another exacerbation with visual disturbance bilaterally, gait changes and cognitive changes.  Reportedly, there was a new lesion in the brainstem and also in the spinal cord at that time. She received 5 more days of  steroid.    In early May 2016, she had the onset of several symptoms including worse cognitive problems, worse gait problems and weakness in the hands.   She went to the [facility] emergency room on 06/03/2014 and she was evaluated. An admitted and received 3 days of IV Solu-Medrol.The MRI of the brain shows many white matter lesions. Some are periventricular with an appearance consistent with MS. She also has deep white matter subcortical and juxtacortical foci. Some of these look more consistent with small vessel ischemic changes and most more consistent with multiple sclerosis. She does have new foci in the left vermis of the cerebellum and in the left frontal periventricular region..   She has been po Tysabri since 2016.  Images MRI 09/24/2018:   She has multiple T2/flair hyperintense foci in the periventricular, juxtacortical, subcortical and deep white matter.  There are also foci in the pons and left thalamus.  Some of the foci in the left frontal lobe likely represent the sequela of the acute stroke noted on the 04/15/2017 MRI.  Other foci are more likely to represent demyelination from MS.  None of the foci appear to be acute and there are no new lesions compared to the 2019 study.  Also has mild chronic sphenoid sinusitis.  MRI of the brain 06/05/2014 showed suspected active demyelinating plaque in the left corpus callosum and left vermis  MRI of the cervical spine 06/05/2014 showed a normal spinal cord.   REVIEW OF SYSTEMS: Constitutional: No fevers, chills, sweats, or change in appetite.   She has fatigue Eyes: No visual changes, double vision, eye pain Ear, nose and throat: No hearing loss, ear pain, nasal congestion, sore throat Cardiovascular: No chest pain, palpitations Respiratory:  No shortness of breath at rest or with exertion.   No wheezes GastrointestinaI: No nausea, vomiting, diarrhea, abdominal pain, fecal incontinence Genitourinary:  No dysuria, urinary retention.    Frequency better on medications. Musculoskeletal:  No neck pain, back pain Integumentary: No rash, pruritus, skin lesions Neurological: as above Psychiatric: No depression at this time.  No anxiety Endocrine: No palpitations, diaphoresis, change in appetite, change in weigh or increased thirst Hematologic/Lymphatic:  No anemia, purpura, petechiae. Allergic/Immunologic: No itchy/runny eyes, nasal congestion, recent allergic reactions, rashes  ALLERGIES: Allergies  Allergen Reactions   Darvon [Propoxyphene] Anaphylaxis and Other (See Comments)    Throat swelling   Oxycodone Anaphylaxis   Percocet [Oxycodone-Acetaminophen] Anaphylaxis    HOME MEDICATIONS:  Current Outpatient Medications:  aspirin EC 81 MG EC tablet, Take 1 tablet (81 mg total) by mouth daily., Disp: 30 tablet, Rfl: 0   atorvastatin (LIPITOR) 80 MG tablet, Take 1 tablet (80 mg total) by mouth daily at 6 PM., Disp: 30 tablet, Rfl: 0   carvedilol (COREG) 25 MG tablet, Take 25 mg by mouth 2 (two) times daily with a meal., Disp: , Rfl:    clopidogrel (PLAVIX) 75 MG tablet, Take 1 tablet by mouth once daily, Disp: 90 tablet, Rfl: 0   escitalopram (LEXAPRO) 20 MG tablet, Take 20 mg by mouth daily., Disp: , Rfl:    gabapentin (NEURONTIN) 300 MG capsule, Take 1 capsule (300 mg total) by mouth at bedtime., Disp: 90 capsule, Rfl: 3   hydrALAZINE (APRESOLINE) 50 MG tablet, Take 50 mg by mouth 2 (two) times daily., Disp: , Rfl:    lidocaine-prilocaine (EMLA) cream, Apply 1 application topically as needed., Disp: 30 g, Rfl: 1   natalizumab (TYSABRI) 300 MG/15ML injection, Inject 15 mLs into the vein every 28 (twenty-eight) days. , Disp: , Rfl:    methylphenidate (RITALIN) 10 MG tablet, Take 2 pills qAM and one pill po 4 hours later, Disp: 90 tablet, Rfl: 0  PAST MEDICAL HISTORY: Past Medical History:  Diagnosis Date   Complication of anesthesia    hard time waking up    Hearing loss    Hypertension    Kidney stones     Multiple sclerosis (HCC)    Sleep apnea    Uses CPAP   Stroke (HCC) 03/2017   Vision abnormalities     PAST SURGICAL HISTORY: Past Surgical History:  Procedure Laterality Date   CESAREAN SECTION  1986   CHOLECYSTECTOMY  2000   laparoscopic   IR FLUORO GUIDED NEEDLE PLC ASPIRATION/INJECTION LOC  04/15/2017   IR IMAGING GUIDED PORT INSERTION  10/20/2018   LAPAROSCOPIC GASTRIC RESTRICTIVE DUODENAL PROCEDURE (DUODENAL SWITCH)     RADIOLOGY WITH ANESTHESIA N/A 04/15/2017   Procedure: LUMBER PUNCTURE MRI SCAN;  Surgeon: Radiologist, Medication, MD;  Location: [facility] OR;  Service: Radiology;  Laterality: N/A;   TEE WITHOUT CARDIOVERSION N/A 04/16/2017   Procedure: TRANSESOPHAGEAL ECHOCARDIOGRAM (TEE) WITH LOOP;  Surgeon: Wendall Stade, MD;  Location: [facility] ENDOSCOPY;  Service: Cardiovascular;  Laterality: N/A;    FAMILY HISTORY: Family History  Problem Relation Age of Onset   Fibromyalgia Mother    Stroke Father    Diabetes Father    Stroke Other     SOCIAL HISTORY:  Social History   Socioeconomic History   Marital status: Single    Spouse name: Not on file   Number of children: Not on file   Years of education: Not on file   Highest education level: Not on file  Occupational History   Occupation: disabled  Tobacco Use   Smoking status: Never   Smokeless tobacco: Never  Vaping Use   Vaping Use: Never used  Substance and Sexual Activity   Alcohol use: Not Currently    Comment: Occasional   Drug use: No   Sexual activity: Not Currently    Birth control/protection: None  Other Topics Concern   Not on file  Social History Narrative   Right Handed   3 Sodas Per Day   Social Determinants of Health   Financial Resource Strain: Not on file  Food Insecurity: Not on file  Transportation Needs: Not on file  Physical Activity: Not on file  Stress: Not on file  Social Connections: Not on file  Intimate Partner  Violence: Not on file     PHYSICAL EXAM  Vitals:   07/01/22  1054  BP: (!) 166/96  Pulse: 69  Weight: 217 lb (98.4 kg)  Height: 5\' 9"  (1.753 m)    Body mass index is 32.05 kg/m.   General: The patient is well-developed and well-nourished and in no acute distress  Neurologic Exam  Mental status: The patient is alert and oriented x 3 at the time of the examination.   Good affect today.  She is alert and oriented.     Focus and attention are good today.  Minimal aphasia during conversation.  Cranial nerves: Extraocular movements are full.   Pupils react equally to light and accommodation.  Fairly symmetric color vision facial strength and sensation is normal.  Trapezius strength is normal.    Hearing was symmetric.  Motor:  Muscle bulk is normal.  Muscle tone is normal in the arms are mildly increased in the left leg.  Strength is 5/5.except 4+/5 left toe extension.   Sensory: Sensory testing shows intact touch sensation in the arms and legs  Coordination: Finger-nose-finger is performed well.  Heel-to-shin is reduced on the left.  Gait and station: Station is normal.  The gait is mildly wide with reduced stride.  The tandem gait is mildly wide.  She does not have a foot drop..    Romberg is negative.   Reflexes: Deep tendon reflexes are symmetric and increased in legs with spread at knees but no ankle clonus.       DIAGNOSTIC DATA (LABS, IMAGING, TESTING) - I reviewed patient records, labs, notes, testing and imaging myself where available.  Lab Results  Component Value Date   WBC 7.8 06/26/2022   HGB 11.8 06/26/2022   HCT 36.1 06/26/2022   MCV 89 06/26/2022   PLT 196 06/26/2022      Component Value Date/Time   NA 143 02/11/2019 0516   NA 143 02/24/2018 1332   K 4.4 02/11/2019 0516   CL 109 02/11/2019 0516   CO2 25 02/11/2019 0516   GLUCOSE 100 (H) 02/11/2019 0516   BUN 17 02/11/2019 0516   BUN 19 02/24/2018 1332   CREATININE 1.25 (H) 02/11/2019 0516   CALCIUM 9.0 02/11/2019 0516   PROT 6.3 (L) 02/11/2019 0516   PROT 5.9  (L) 02/24/2018 1332   ALBUMIN 4.0 02/11/2019 0516   ALBUMIN 4.3 02/24/2018 1332   AST 30 02/11/2019 0516   ALT 57 (H) 02/11/2019 0516   ALKPHOS 139 (H) 02/11/2019 0516   BILITOT 0.9 02/11/2019 0516   BILITOT 0.9 02/24/2018 1332   GFRNONAA 48 (L) 02/11/2019 0516   GFRAA 55 (L) 02/11/2019 0516      ASSESSMENT AND PLAN    1. Multiple sclerosis (HCC)   2. High risk medication use   3. Cerebrovascular accident (CVA) due to thrombosis of precerebral artery (HCC)   4. Gait disturbance   5. Obstructive sleep apnea on CPAP   6. Depression with anxiety      1.   Her MS has been stable.  Continue Tysabri..   JCV antibody was recently negative .  However, one was low positive in 2022  .  Therefore we switched her to every 6 weeks. She has a pending JCV Ab drawn last week 2.   Continue plavix plus aspirin for stroke.  CT angiogram had shown moderate stenosis of the right PCA 3.   Stay active and exercise as tolerated.     4.    Continue  CPAP at current settings.   She has lost some weight we may want to consider doing a home sleep study if more weight is lost.   5.   Etiology of fall uncertain.  Most likely trip/fall.   However, if any more LOC events, consider EEG.    She will return in 6 months for f/u with me or call sooner if problems      Lisaanne Lawrie A. Epimenio Foot, MD, PhD, FAAN Certified in Neurology, Clinical Neurophysiology, Sleep Medicine, Pain Medicine and Neuroimaging Director, [facility]  [facility] [address], [address] [place], [place] [address] [phone]

## 2022-07-02 ENCOUNTER — Telehealth: Payer: Self-pay | Admitting: Neurology

## 2022-07-02 MED ORDER — CLOPIDOGREL BISULFATE 75 MG PO TABS: 75.0000 mg | ORAL_TABLET | Freq: Every day | ORAL | 1 refills | Status: DC

## 2022-07-02 NOTE — Telephone Encounter (Signed)
Pt stated she lost all her medication. Stated she needs a refill on clopidogrel (PLAVIX) 75 MG tablet. Refill should be sent to  [facility] [phone]

## 2022-07-02 NOTE — Telephone Encounter (Signed)
Pt was seen on 07/01/22 "Continue plavix plus aspirin for stroke. " Follow up scheduled on 11/18/22

## 2022-07-03 NOTE — Telephone Encounter (Signed)
JCV ab drawn on 06/26/22 indeterminate, index: 0.23. Inhibition assay: negative. Gave to MD for review.

## 2022-07-18 ENCOUNTER — Encounter: Payer: Self-pay | Admitting: Neurology

## 2022-07-27 DIAGNOSIS — G4733 Obstructive sleep apnea (adult) (pediatric): Secondary | ICD-10-CM | POA: Diagnosis not present

## 2022-08-07 DIAGNOSIS — G35 Multiple sclerosis: Secondary | ICD-10-CM | POA: Diagnosis not present

## 2022-08-14 ENCOUNTER — Ambulatory Visit (LOCAL_COMMUNITY_HEALTH_CENTER): Payer: Self-pay

## 2022-08-14 DIAGNOSIS — Z111 Encounter for screening for respiratory tuberculosis: Secondary | ICD-10-CM

## 2022-08-14 NOTE — Progress Notes (Signed)
Pt has hx +PPD August 2022.  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  Screening form completed and given to pt.  Denies symptoms.  Copy sent for scanning.  Cherlynn Polo, RN

## 2022-08-26 DIAGNOSIS — G4733 Obstructive sleep apnea (adult) (pediatric): Secondary | ICD-10-CM | POA: Diagnosis not present

## 2022-09-17 DIAGNOSIS — G35 Multiple sclerosis: Secondary | ICD-10-CM | POA: Diagnosis not present

## 2022-09-26 DIAGNOSIS — I10 Essential (primary) hypertension: Secondary | ICD-10-CM | POA: Diagnosis not present

## 2022-09-26 DIAGNOSIS — G4733 Obstructive sleep apnea (adult) (pediatric): Secondary | ICD-10-CM | POA: Diagnosis not present

## 2022-10-27 DIAGNOSIS — G4733 Obstructive sleep apnea (adult) (pediatric): Secondary | ICD-10-CM | POA: Diagnosis not present

## 2022-10-27 DIAGNOSIS — I10 Essential (primary) hypertension: Secondary | ICD-10-CM | POA: Diagnosis not present

## 2022-10-30 ENCOUNTER — Other Ambulatory Visit: Payer: Self-pay

## 2022-10-30 ENCOUNTER — Telehealth: Payer: Self-pay | Admitting: *Deleted

## 2022-10-30 DIAGNOSIS — G35 Multiple sclerosis: Secondary | ICD-10-CM

## 2022-10-30 NOTE — Telephone Encounter (Signed)
Placed [facility] in [facility] lock box for routine lab pick up. Results pending. 

## 2022-10-31 LAB — CBC WITH DIFFERENTIAL/PLATELET
Basophils Absolute: 0 10*3/uL (ref 0.0–0.2)
Basos: 0 %
EOS (ABSOLUTE): 0.3 10*3/uL (ref 0.0–0.4)
Eos: 3 %
Hematocrit: 38.1 % (ref 34.0–46.6)
Hemoglobin: 12 g/dL (ref 11.1–15.9)
Immature Grans (Abs): 0 10*3/uL (ref 0.0–0.1)
Immature Granulocytes: 1 %
Lymphocytes Absolute: 2.2 10*3/uL (ref 0.7–3.1)
Lymphs: 27 %
MCH: 29.7 pg (ref 26.6–33.0)
MCHC: 31.5 g/dL (ref 31.5–35.7)
MCV: 94 fL (ref 79–97)
Monocytes Absolute: 0.5 10*3/uL (ref 0.1–0.9)
Monocytes: 6 %
Neutrophils Absolute: 5.2 10*3/uL (ref 1.4–7.0)
Neutrophils: 63 %
Platelets: 195 10*3/uL (ref 150–450)
RBC: 4.04 x10E6/uL (ref 3.77–5.28)
RDW: 13.2 % (ref 11.7–15.4)
WBC: 8.2 10*3/uL (ref 3.4–10.8)

## 2022-11-18 ENCOUNTER — Encounter: Payer: Self-pay | Admitting: Neurology

## 2022-11-18 ENCOUNTER — Ambulatory Visit: Payer: Medicare HMO | Admitting: Neurology

## 2022-11-18 VITALS — BP 150/93 | HR 61 | Ht 69.0 in | Wt 220.0 lb

## 2022-11-18 DIAGNOSIS — R4701 Aphasia: Secondary | ICD-10-CM | POA: Diagnosis not present

## 2022-11-18 DIAGNOSIS — Z79899 Other long term (current) drug therapy: Secondary | ICD-10-CM | POA: Diagnosis not present

## 2022-11-18 DIAGNOSIS — F418 Other specified anxiety disorders: Secondary | ICD-10-CM | POA: Diagnosis not present

## 2022-11-18 DIAGNOSIS — R269 Unspecified abnormalities of gait and mobility: Secondary | ICD-10-CM

## 2022-11-18 DIAGNOSIS — I6300 Cerebral infarction due to thrombosis of unspecified precerebral artery: Secondary | ICD-10-CM

## 2022-11-18 DIAGNOSIS — G35 Multiple sclerosis: Secondary | ICD-10-CM | POA: Diagnosis not present

## 2022-11-18 MED ORDER — METHYLPHENIDATE HCL 10 MG PO TABS: ORAL_TABLET | ORAL | 0 refills | Status: DC

## 2022-11-18 NOTE — Patient Instructions (Signed)
Can see Rachel Gilbert

## 2022-11-18 NOTE — Progress Notes (Signed)
[facility]  PATIENT: Rachel Gilbert DOB: 06-10-1960  REFERRING DOCTOR OR PCP:  ER  No PCP SOURCE: ER notes, images on PACS, patient  _________________________________   HISTORICAL  CHIEF COMPLAINT:  Chief Complaint  Patient presents with   Follow-up    Pt in room 10 alone. Here for MS follow up on Tysabri. Pt reports MS is stable. Pt reports her balance is not good, pt said she can't exercise she afraid of falling. No recent falls since last visit. She received flu shot and covid booster 11/14/22. Pt reports she needs refill on Ritlan ,she lost medication at home. Pt blood pressure elevated x 2, reports normal readings at home.     HISTORY OF PRESENT ILLNESS:  Rachel Gilbert is 62 y.o. woman with relapsing remitting MS and OSA.    Update 10/222024:  She is on Tysabri 300 mg q 6 weeks and tolerates it well.   She was JCV Ab negative (10/17/2021) She had one 02/2020 test when JCV Ab was 0.26 with positive reflex   We discussed risk would be 1:2000 or so. We changed the dosing interval to q 6 weeks.    Her gait is doing about the sae.  She had a bad fall in April hurting her ankle.  No fall but many stumbles since then.  Legs are symmetric and she noted her muscles fatigue easily   She gets leg cramps and gabapentin helps these.   She has tingling frequently in legs. She no longer takes gabapentin..  She has urinary frequency but also has hesitancy and she sees urology. Not on any medications.  She has 2 x nocturia.   .   She denies current depression/anxiety and is on  Lexapro with benefit.  She is noting mild cognitive issues, word finding, with reduced short term memory.  She has fatigue many days.   Methylphenidate helps fatigue and cognition  She sleeps well most nights.  She uses CPAP. Last Download showed 100% of the nights for 4+ hours and her efficacy is excellent (AHI = 1.1).   Methylphenidate helps fatigue and sleepiness.   She takes 20 mg in AM and 10 mg in  PM  She had the duodenal switch bariatric procedure in 2020 and has lost 80 pounds but gained about 25 pounds back since..  She is trying to do chair yoga and other exercise.    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       She had a stroke 2019 and had aphasia.  She dpes much better with just occasional errors/pauses.  She has some behavioral changes since the stroke and gets upset/angry more quickly.     Stroke History: She was hospitalized at [facility] between 04/12/2017 and 04/21/2017 due to aphasia due to a left MCA distribution stroke.   Additionally, CT angiogram appears to show occlusion of 1 of the MCA branches.     She has been placed on aspirin and then Plavix was added.  She had a TEE but no evidence of a PFO or other source of emboli.  Doppler study showed less than 50% stenosis.  A loop recorder is being considered.    Risk factors:   HTN (on 3 med's), OSA, no DM, no smoking, lipids ok (though Lipitor was added).   .     MS history: She was diagnosed in September 2012 after presenting with diplopia. She was hospitalized and received 5 days of  IV steroids and she had MRI and lumbar puncture. She recalls that the lumbar puncture was not consistent with MS but the MRI was.  She was placed on Gilenya in December 2012. She was able to return to work and the diplopia completely resolved. Then in July 2014 she had another exacerbation with visual disturbance bilaterally, gait changes and cognitive changes.  Reportedly, there was a new lesion in the brainstem and also in the spinal cord at that time. She received 5 more days of steroid.    In early May 2016, she had the onset of several symptoms including worse cognitive problems, worse gait problems and weakness in the hands.   She went to the [facility] on 06/03/2014 and she was evaluated. An admitted and received 3 days of IV Solu-Medrol.The MRI of the brain shows many white  matter lesions. Some are periventricular with an appearance consistent with MS. She also has deep white matter subcortical and juxtacortical foci. Some of these look more consistent with small vessel ischemic changes and most more consistent with multiple sclerosis. She does have new foci in the left vermis of the cerebellum and in the left frontal periventricular region..   She has been po Tysabri since 2016.  Images MRI 09/24/2018:   She has multiple T2/flair hyperintense foci in the periventricular, juxtacortical, subcortical and deep white matter.  There are also foci in the pons and left thalamus.  Some of the foci in the left frontal lobe likely represent the sequela of the acute stroke noted on the 04/15/2017 MRI.  Other foci are more likely to represent demyelination from MS.  None of the foci appear to be acute and there are no new lesions compared to the 2019 study.  Also has mild chronic sphenoid sinusitis.  MRI of the brain 06/05/2014 showed suspected active demyelinating plaque in the left corpus callosum and left vermis  MRI of the cervical spine 06/05/2014 showed a normal spinal cord.   REVIEW OF SYSTEMS: Constitutional: No fevers, chills, sweats, or change in appetite.   She has fatigue Eyes: No visual changes, double vision, eye pain Ear, nose and throat: No hearing loss, ear pain, nasal congestion, sore throat Cardiovascular: No chest pain, palpitations Respiratory:  No shortness of breath at rest or with exertion.   No wheezes GastrointestinaI: No nausea, vomiting, diarrhea, abdominal pain, fecal incontinence Genitourinary:  No dysuria, urinary retention.   Frequency better on medications. Musculoskeletal:  No neck pain, back pain Integumentary: No rash, pruritus, skin lesions Neurological: as above Psychiatric: No depression at this time.  No anxiety Endocrine: No palpitations, diaphoresis, change in appetite, change in weigh or increased thirst Hematologic/Lymphatic:  No anemia,  purpura, petechiae. Allergic/Immunologic: No itchy/runny eyes, nasal congestion, recent allergic reactions, rashes  ALLERGIES: Allergies  Allergen Reactions   Darvon [Propoxyphene] Anaphylaxis and Other (See Comments)    Throat swelling   Oxycodone Anaphylaxis   Percocet [Oxycodone-Acetaminophen] Anaphylaxis    HOME MEDICATIONS:  Current Outpatient Medications:    aspirin EC 81 MG EC tablet, Take 1 tablet (81 mg total) by mouth daily., Disp: 30 tablet, Rfl: 0   atorvastatin (LIPITOR) 80 MG tablet, Take 1 tablet (80 mg total) by mouth daily at 6 PM., Disp: 30 tablet, Rfl: 0   carvedilol (COREG) 25 MG tablet, Take 25 mg by mouth 2 (two) times daily with a meal., Disp: , Rfl:    clopidogrel (PLAVIX) 75 MG tablet, Take 1 tablet (75 mg total) by mouth daily., Disp: 90  tablet, Rfl: 1   escitalopram (LEXAPRO) 20 MG tablet, Take 20 mg by mouth daily., Disp: , Rfl:    gabapentin (NEURONTIN) 300 MG capsule, Take 1 capsule (300 mg total) by mouth at bedtime., Disp: 90 capsule, Rfl: 3   hydrALAZINE (APRESOLINE) 50 MG tablet, Take 50 mg by mouth 2 (two) times daily., Disp: , Rfl:    lidocaine-prilocaine (EMLA) cream, Apply 1 application topically as needed., Disp: 30 g, Rfl: 1   natalizumab (TYSABRI) 300 MG/15ML injection, Inject 15 mLs into the vein every 28 (twenty-eight) days. , Disp: , Rfl:    methylphenidate (RITALIN) 10 MG tablet, Take 2 pills qAM and one pill po 4 hours later, Disp: 90 tablet, Rfl: 0  PAST MEDICAL HISTORY: Past Medical History:  Diagnosis Date   Complication of anesthesia    hard time waking up    Hearing loss    Hypertension    Kidney stones    Multiple sclerosis (HCC)    Sleep apnea    Uses CPAP   Stroke (HCC) 03/2017   Vision abnormalities     PAST SURGICAL HISTORY: Past Surgical History:  Procedure Laterality Date   CESAREAN SECTION  1986   CHOLECYSTECTOMY  2000   laparoscopic   IR FLUORO GUIDED NEEDLE PLC ASPIRATION/INJECTION LOC  04/15/2017   IR IMAGING  GUIDED PORT INSERTION  10/20/2018   LAPAROSCOPIC GASTRIC RESTRICTIVE DUODENAL PROCEDURE (DUODENAL SWITCH)     RADIOLOGY WITH ANESTHESIA N/A 04/15/2017   Procedure: LUMBER PUNCTURE MRI SCAN;  Surgeon: Radiologist, Medication, MD;  Location: [facility] OR;  Service: Radiology;  Laterality: N/A;   TEE WITHOUT CARDIOVERSION N/A 04/16/2017   Procedure: TRANSESOPHAGEAL ECHOCARDIOGRAM (TEE) WITH LOOP;  Surgeon: Wendall Stade, MD;  Location: [facility] ENDOSCOPY;  Service: Cardiovascular;  Laterality: N/A;    FAMILY HISTORY: Family History  Problem Relation Age of Onset   Fibromyalgia Mother    Stroke Father    Diabetes Father    Stroke Other     SOCIAL HISTORY:  Social History   Socioeconomic History   Marital status: Single    Spouse name: Not on file   Number of children: Not on file   Years of education: Not on file   Highest education level: Not on file  Occupational History   Occupation: disabled  Tobacco Use   Smoking status: Never   Smokeless tobacco: Never  Vaping Use   Vaping status: Never Used  Substance and Sexual Activity   Alcohol use: Not Currently    Comment: Occasional   Drug use: No   Sexual activity: Not Currently    Birth control/protection: None  Other Topics Concern   Not on file  Social History Narrative   Right Handed   3 Sodas Per Day   Social Determinants of Health   Financial Resource Strain: Low Risk  (01/28/2022)   Received from [place] [facility] the [place], [facility] the [employer] (CARDIA)    Difficulty of Paying Living Expenses: Not hard at all  Food Insecurity: Not on file  Transportation Needs: Not on file  Physical Activity: Insufficiently Active (01/28/2022)   Received from [place] [facility] the [place], [facility] the [place]   Exercise Vital Sign    Days of Exercise per Week: 3 days    Minutes of Exercise per Session: 30 min  Stress: No Stress Concern Present (01/28/2022)   Received from [place] [facility] the  [place], [facility] [employer] of Occupational [facility] -  Occupational Stress Questionnaire    Feeling of Stress : Not at all  Social Connections: Not on file  Intimate Partner Violence: Not on file     PHYSICAL EXAM  Vitals:   11/18/22 0952 11/18/22 0957  BP: (!) 150/85 (!) 150/93  Pulse: (!) 58 61  Weight: 220 lb (99.8 kg)   Height: 5\' 9"  (1.753 m)     Body mass index is 32.49 kg/m.   General: The patient is well-developed and well-nourished and in no acute distress  Neurologic Exam  Mental status: The patient is alert and oriented x 3 at the time of the examination.   Good affect today.  She is alert and oriented.     Focus and attention are good today.  Minimal aphasia during conversation.  Cranial nerves: Extraocular movements are full.   Pupils react equally to light and accommodation.  Fairly symmetric color vision facial strength and sensation is normal.  Trapezius strength is normal.    Hearing was symmetric.  Motor:  Muscle bulk is normal.  Muscle tone is normal in the arms are mildly increased in the left leg.  Strength is 5/5.except 4+/5 left toe extension.   Sensory: Sensory testing shows intact touch sensation in the arms and legs  Coordination: Finger-nose-finger is performed well.  Heel-to-shin is reduced on the left.  Gait and station: Station is normal.  Her gait is wide with reduced stride.  The tandem gait is poor and unstable.    Romberg is negative.   Reflexes: Deep tendon reflexes are symmetric and increased in legs with spread at knees but no ankle clonus.       DIAGNOSTIC DATA (LABS, IMAGING, TESTING) - I reviewed patient records, labs, notes, testing and imaging myself where available.  Lab Results  Component Value Date   WBC 8.2 10/30/2022   HGB 12.0 10/30/2022   HCT 38.1 10/30/2022   MCV 94 10/30/2022   PLT 195 10/30/2022      Component Value Date/Time   NA 143 02/11/2019 0516   NA 143 02/24/2018 1332   K  4.4 02/11/2019 0516   CL 109 02/11/2019 0516   CO2 25 02/11/2019 0516   GLUCOSE 100 (H) 02/11/2019 0516   BUN 17 02/11/2019 0516   BUN 19 02/24/2018 1332   CREATININE 1.25 (H) 02/11/2019 0516   CALCIUM 9.0 02/11/2019 0516   PROT 6.3 (L) 02/11/2019 0516   PROT 5.9 (L) 02/24/2018 1332   ALBUMIN 4.0 02/11/2019 0516   ALBUMIN 4.3 02/24/2018 1332   AST 30 02/11/2019 0516   ALT 57 (H) 02/11/2019 0516   ALKPHOS 139 (H) 02/11/2019 0516   BILITOT 0.9 02/11/2019 0516   BILITOT 0.9 02/24/2018 1332   GFRNONAA 48 (L) 02/11/2019 0516   GFRAA 55 (L) 02/11/2019 0516      ASSESSMENT AND PLAN    1. Multiple sclerosis (HCC)   2. High risk medication use   3. Cerebrovascular accident (CVA) due to thrombosis of precerebral artery (HCC)   4. Gait disturbance   5. Depression with anxiety   6. Aphasia      1.   Her MS is stable.  Continue Tysabri..   She is JCV Ab negative (0.19 10/30/2022) .    However, one was low positive in 2022  .  Therefore we switched her to every 6 weeks.   2.   Continue plavix plus aspirin for stroke.  CT angiogram had shown moderate stenosis of the right PCA 3.   Stay active  and exercise as tolerated.     4.   She will continue CPAP at current settings.     5.   She is medically clearedt to go on a GLP-1 agnet for weight loss (Wegovy/Ozempic or Mounjaro/Zepboud)  6.   She will return in 6 months for f/u with me or call sooner if problems     This visit is part of a comprehensive longitudinal care medical relationship regarding the patients primary diagnosis of MS and related concerns.   Cathrine Krizan A. Epimenio Foot, MD, PhD, FAAN Certified in Neurology, Clinical Neurophysiology, Sleep Medicine, Pain Medicine and Neuroimaging Director, [facility]  [facility] [address], [address] [place], [place] [address] [phone]

## 2022-11-26 DIAGNOSIS — I10 Essential (primary) hypertension: Secondary | ICD-10-CM | POA: Diagnosis not present

## 2022-11-26 DIAGNOSIS — G4733 Obstructive sleep apnea (adult) (pediatric): Secondary | ICD-10-CM | POA: Diagnosis not present

## 2022-12-11 ENCOUNTER — Other Ambulatory Visit: Payer: Self-pay

## 2022-12-11 ENCOUNTER — Telehealth: Payer: Self-pay

## 2022-12-11 DIAGNOSIS — G35 Multiple sclerosis: Secondary | ICD-10-CM

## 2022-12-11 NOTE — Telephone Encounter (Signed)
Placed JCV in [facility] box 12/11/2022

## 2022-12-12 LAB — CBC WITH DIFFERENTIAL/PLATELET
Basophils Absolute: 0.1 10*3/uL (ref 0.0–0.2)
Basos: 1 %
EOS (ABSOLUTE): 0.3 10*3/uL (ref 0.0–0.4)
Eos: 3 %
Hematocrit: 39.5 % (ref 34.0–46.6)
Hemoglobin: 12.7 g/dL (ref 11.1–15.9)
Immature Grans (Abs): 0 10*3/uL (ref 0.0–0.1)
Immature Granulocytes: 1 %
Lymphocytes Absolute: 2.3 10*3/uL (ref 0.7–3.1)
Lymphs: 26 %
MCH: 29.7 pg (ref 26.6–33.0)
MCHC: 32.2 g/dL (ref 31.5–35.7)
MCV: 93 fL (ref 79–97)
Monocytes Absolute: 0.6 10*3/uL (ref 0.1–0.9)
Monocytes: 7 %
Neutrophils Absolute: 5.6 10*3/uL (ref 1.4–7.0)
Neutrophils: 62 %
Platelets: 212 10*3/uL (ref 150–450)
RBC: 4.27 x10E6/uL (ref 3.77–5.28)
RDW: 13.3 % (ref 11.7–15.4)
WBC: 8.8 10*3/uL (ref 3.4–10.8)

## 2022-12-21 ENCOUNTER — Other Ambulatory Visit: Payer: Self-pay | Admitting: Neurology

## 2022-12-22 NOTE — Telephone Encounter (Signed)
Last seen on 11/18/22 Follow up scheduled on 06/09/23

## 2022-12-26 DIAGNOSIS — I10 Essential (primary) hypertension: Secondary | ICD-10-CM | POA: Diagnosis not present

## 2022-12-26 DIAGNOSIS — G4733 Obstructive sleep apnea (adult) (pediatric): Secondary | ICD-10-CM | POA: Diagnosis not present

## 2022-12-27 DIAGNOSIS — G4733 Obstructive sleep apnea (adult) (pediatric): Secondary | ICD-10-CM | POA: Diagnosis not present

## 2023-01-25 DIAGNOSIS — I10 Essential (primary) hypertension: Secondary | ICD-10-CM | POA: Diagnosis not present

## 2023-01-25 DIAGNOSIS — G4733 Obstructive sleep apnea (adult) (pediatric): Secondary | ICD-10-CM | POA: Diagnosis not present

## 2023-01-26 DIAGNOSIS — G4733 Obstructive sleep apnea (adult) (pediatric): Secondary | ICD-10-CM | POA: Diagnosis not present

## 2023-01-29 DIAGNOSIS — G35 Multiple sclerosis: Secondary | ICD-10-CM | POA: Diagnosis not present

## 2023-02-03 DIAGNOSIS — Z7689 Persons encountering health services in other specified circumstances: Secondary | ICD-10-CM | POA: Diagnosis not present

## 2023-02-03 DIAGNOSIS — G35 Multiple sclerosis: Secondary | ICD-10-CM | POA: Diagnosis not present

## 2023-02-03 DIAGNOSIS — Z6833 Body mass index (BMI) 33.0-33.9, adult: Secondary | ICD-10-CM | POA: Diagnosis not present

## 2023-02-03 DIAGNOSIS — E559 Vitamin D deficiency, unspecified: Secondary | ICD-10-CM | POA: Diagnosis not present

## 2023-02-03 DIAGNOSIS — Z1231 Encounter for screening mammogram for malignant neoplasm of breast: Secondary | ICD-10-CM | POA: Diagnosis not present

## 2023-02-03 DIAGNOSIS — Z713 Dietary counseling and surveillance: Secondary | ICD-10-CM | POA: Diagnosis not present

## 2023-02-03 DIAGNOSIS — E66811 Obesity, class 1: Secondary | ICD-10-CM | POA: Diagnosis not present

## 2023-02-03 DIAGNOSIS — R7302 Impaired glucose tolerance (oral): Secondary | ICD-10-CM | POA: Diagnosis not present

## 2023-02-03 DIAGNOSIS — F32A Depression, unspecified: Secondary | ICD-10-CM | POA: Diagnosis not present

## 2023-02-03 DIAGNOSIS — I10 Essential (primary) hypertension: Secondary | ICD-10-CM | POA: Diagnosis not present

## 2023-02-03 DIAGNOSIS — Z0000 Encounter for general adult medical examination without abnormal findings: Secondary | ICD-10-CM | POA: Diagnosis not present

## 2023-02-03 DIAGNOSIS — E782 Mixed hyperlipidemia: Secondary | ICD-10-CM | POA: Diagnosis not present

## 2023-02-10 ENCOUNTER — Telehealth: Payer: Self-pay | Admitting: Neurology

## 2023-02-10 NOTE — Telephone Encounter (Signed)
 Pt r/s appt due to work

## 2023-02-25 DIAGNOSIS — I10 Essential (primary) hypertension: Secondary | ICD-10-CM | POA: Diagnosis not present

## 2023-02-25 DIAGNOSIS — G4733 Obstructive sleep apnea (adult) (pediatric): Secondary | ICD-10-CM | POA: Diagnosis not present

## 2023-02-26 DIAGNOSIS — G4733 Obstructive sleep apnea (adult) (pediatric): Secondary | ICD-10-CM | POA: Diagnosis not present

## 2023-03-16 ENCOUNTER — Other Ambulatory Visit: Payer: Self-pay | Admitting: Neurology

## 2023-03-16 MED ORDER — METHYLPHENIDATE HCL 10 MG PO TABS: ORAL_TABLET | ORAL | 0 refills | Status: DC

## 2023-03-16 NOTE — Telephone Encounter (Signed)
 Pt called wanting her methylphenidate (RITALIN) 10 MG tablet sent in to the [facility]

## 2023-03-16 NOTE — Telephone Encounter (Signed)
 Last seen 11/18/22 and next f/u 08/10/23. Last refilled 11/18/22 #90.

## 2023-03-23 ENCOUNTER — Other Ambulatory Visit: Payer: Self-pay | Admitting: *Deleted

## 2023-03-23 ENCOUNTER — Other Ambulatory Visit: Payer: Self-pay

## 2023-03-23 DIAGNOSIS — Z79899 Other long term (current) drug therapy: Secondary | ICD-10-CM

## 2023-03-23 DIAGNOSIS — G35 Multiple sclerosis: Secondary | ICD-10-CM | POA: Diagnosis not present

## 2023-03-24 LAB — CBC WITH DIFFERENTIAL/PLATELET
Basophils Absolute: 0 10*3/uL (ref 0.0–0.2)
Basos: 1 %
EOS (ABSOLUTE): 0.2 10*3/uL (ref 0.0–0.4)
Eos: 3 %
Hematocrit: 38.6 % (ref 34.0–46.6)
Hemoglobin: 12.4 g/dL (ref 11.1–15.9)
Immature Grans (Abs): 0.1 10*3/uL (ref 0.0–0.1)
Immature Granulocytes: 1 %
Lymphocytes Absolute: 2.1 10*3/uL (ref 0.7–3.1)
Lymphs: 26 %
MCH: 29.7 pg (ref 26.6–33.0)
MCHC: 32.1 g/dL (ref 31.5–35.7)
MCV: 93 fL (ref 79–97)
Monocytes Absolute: 0.6 10*3/uL (ref 0.1–0.9)
Monocytes: 8 %
Neutrophils Absolute: 5 10*3/uL (ref 1.4–7.0)
Neutrophils: 61 %
Platelets: 218 10*3/uL (ref 150–450)
RBC: 4.17 x10E6/uL (ref 3.77–5.28)
RDW: 13.2 % (ref 11.7–15.4)
WBC: 8 10*3/uL (ref 3.4–10.8)

## 2023-03-30 ENCOUNTER — Telehealth: Payer: Self-pay | Admitting: *Deleted

## 2023-03-30 NOTE — Telephone Encounter (Signed)
 Rachel Gilbert

## 2023-04-17 ENCOUNTER — Encounter: Payer: Self-pay | Admitting: Neurology

## 2023-05-04 DIAGNOSIS — G35 Multiple sclerosis: Secondary | ICD-10-CM | POA: Diagnosis not present

## 2023-05-07 ENCOUNTER — Encounter: Payer: Self-pay | Admitting: Neurology

## 2023-06-08 ENCOUNTER — Other Ambulatory Visit: Payer: Self-pay | Admitting: Neurology

## 2023-06-09 ENCOUNTER — Ambulatory Visit: Payer: Medicare HMO | Admitting: Family Medicine

## 2023-06-15 ENCOUNTER — Other Ambulatory Visit: Payer: Self-pay | Admitting: *Deleted

## 2023-06-15 ENCOUNTER — Other Ambulatory Visit: Payer: Self-pay

## 2023-06-15 ENCOUNTER — Telehealth: Payer: Self-pay | Admitting: *Deleted

## 2023-06-15 DIAGNOSIS — G35 Multiple sclerosis: Secondary | ICD-10-CM | POA: Diagnosis not present

## 2023-06-15 DIAGNOSIS — Z79899 Other long term (current) drug therapy: Secondary | ICD-10-CM

## 2023-06-15 NOTE — Telephone Encounter (Signed)
 Placed JCV lab in [facility] lock box for routine lab pick up. Results pending.

## 2023-06-16 LAB — CBC WITH DIFFERENTIAL/PLATELET
Basophils Absolute: 0 10*3/uL (ref 0.0–0.2)
Basos: 0 %
EOS (ABSOLUTE): 0.2 10*3/uL (ref 0.0–0.4)
Eos: 2 %
Hematocrit: 39.2 % (ref 34.0–46.6)
Hemoglobin: 12.6 g/dL (ref 11.1–15.9)
Immature Grans (Abs): 0.1 10*3/uL (ref 0.0–0.1)
Immature Granulocytes: 1 %
Lymphocytes Absolute: 2.5 10*3/uL (ref 0.7–3.1)
Lymphs: 27 %
MCH: 30.4 pg (ref 26.6–33.0)
MCHC: 32.1 g/dL (ref 31.5–35.7)
MCV: 95 fL (ref 79–97)
Monocytes Absolute: 0.7 10*3/uL (ref 0.1–0.9)
Monocytes: 8 %
Neutrophils Absolute: 5.9 10*3/uL (ref 1.4–7.0)
Neutrophils: 62 %
Platelets: 194 10*3/uL (ref 150–450)
RBC: 4.15 x10E6/uL (ref 3.77–5.28)
RDW: 13.6 % (ref 11.7–15.4)
WBC: 9.4 10*3/uL (ref 3.4–10.8)

## 2023-07-06 ENCOUNTER — Encounter: Payer: Self-pay | Admitting: Neurology

## 2023-07-06 DIAGNOSIS — G4733 Obstructive sleep apnea (adult) (pediatric): Secondary | ICD-10-CM | POA: Diagnosis not present

## 2023-07-06 DIAGNOSIS — I10 Essential (primary) hypertension: Secondary | ICD-10-CM | POA: Diagnosis not present

## 2023-07-09 ENCOUNTER — Other Ambulatory Visit: Payer: Self-pay | Admitting: Neurology

## 2023-07-09 NOTE — Telephone Encounter (Signed)
 Last seen on 11/20/22 Follow up scheduled on 08/10/23   Dispensed Days Supply Quantity Provider Pharmacy  METHYLPHENID TAB 10MG  03/16/2023 30 90 tablet Sater, Sherida Dimmer, MD [facility] [phone] .      Rx pending to be signed

## 2023-07-09 NOTE — Telephone Encounter (Signed)
 Pt is requesting a refill for methylphenidate  (RITALIN ) 10 MG tablet .  Pharmacy: [facility] [phone]

## 2023-07-10 MED ORDER — METHYLPHENIDATE HCL 10 MG PO TABS: ORAL_TABLET | ORAL | 0 refills | Status: DC

## 2023-07-15 ENCOUNTER — Telehealth: Payer: Self-pay | Admitting: Family Medicine

## 2023-07-17 ENCOUNTER — Ambulatory Visit (LOCAL_COMMUNITY_HEALTH_CENTER): Payer: Self-pay

## 2023-07-17 ENCOUNTER — Other Ambulatory Visit: Payer: Self-pay

## 2023-07-17 NOTE — Progress Notes (Signed)
 Patient with history of latent tuberculosis infection, in clinic today to complete annual TB record of screening.   Patient denies having unexplained productive cough, unexplained fever, night sweats, shortness of breath, chest pain unexplained weight loss, loss of appetite, or unexplained fatigue.   TB screening for symptoms completed, form given to patient, copy available in EPIC/Media.   Patient advised to notify TB clinic nurse and/or medical provider if she develops TB disease symptoms as reviewed during the visit. States understanding and agrees.   Michele Ahle, RN

## 2023-07-27 ENCOUNTER — Encounter: Payer: Self-pay | Admitting: Neurology

## 2023-07-27 DIAGNOSIS — G35 Multiple sclerosis: Secondary | ICD-10-CM | POA: Diagnosis not present

## 2023-07-28 ENCOUNTER — Other Ambulatory Visit: Payer: Self-pay

## 2023-07-28 DIAGNOSIS — Z95828 Presence of other vascular implants and grafts: Secondary | ICD-10-CM

## 2023-07-28 MED ORDER — LIDOCAINE-PRILOCAINE 2.5-2.5 % EX CREA: 1.0000 | TOPICAL_CREAM | CUTANEOUS | 0 refills | Status: AC | PRN

## 2023-08-10 ENCOUNTER — Encounter: Payer: Self-pay | Admitting: Family Medicine

## 2023-08-10 ENCOUNTER — Ambulatory Visit (INDEPENDENT_AMBULATORY_CARE_PROVIDER_SITE_OTHER): Payer: Medicare HMO | Admitting: Family Medicine

## 2023-08-10 VITALS — BP 145/72 | HR 70 | Ht 69.0 in | Wt 217.4 lb

## 2023-08-10 DIAGNOSIS — G4733 Obstructive sleep apnea (adult) (pediatric): Secondary | ICD-10-CM | POA: Diagnosis not present

## 2023-08-10 DIAGNOSIS — G35 Multiple sclerosis: Secondary | ICD-10-CM | POA: Diagnosis not present

## 2023-08-10 DIAGNOSIS — Z79899 Other long term (current) drug therapy: Secondary | ICD-10-CM | POA: Diagnosis not present

## 2023-08-10 DIAGNOSIS — F418 Other specified anxiety disorders: Secondary | ICD-10-CM | POA: Diagnosis not present

## 2023-08-10 DIAGNOSIS — I69320 Aphasia following cerebral infarction: Secondary | ICD-10-CM

## 2023-08-10 DIAGNOSIS — R269 Unspecified abnormalities of gait and mobility: Secondary | ICD-10-CM

## 2023-08-10 DIAGNOSIS — I6300 Cerebral infarction due to thrombosis of unspecified precerebral artery: Secondary | ICD-10-CM

## 2023-08-10 MED ORDER — METHYLPHENIDATE HCL 10 MG PO TABS: ORAL_TABLET | ORAL | 0 refills | Status: AC

## 2023-08-10 NOTE — Progress Notes (Signed)
 SABRA

## 2023-08-10 NOTE — Progress Notes (Signed)
 Chief Complaint  Patient presents with   Follow-up    Pt in room 2.alone. Here for MS follow up. DMT: Tysabri , Last infusion date: 07/27/2023 Next infusion date: 09/07/2023. Pt said MS is stable, pt said at the end of infusion week 5/6 she can feel she needs infusion. Pt needs refill on Ritalin . Stopped gabapentin  was taking for RLS. No recent falls.    HISTORY OF PRESENT ILLNESS:  08/10/23 ALL:  Rachel Gilbert is a 63 y.o. female here today for follow up for RRMS and OSA on CPAP. She continues Tysabri . Labs have been stable. JCV 0.30, assay negative.  Infusions continued at Q6W intervals.  MRI brain 10/2021 stable. Multiple chronic microhemorrhages in deep gray matter most consistent with hypertensive change.   She feels MS symptoms are stable. No new or exacerbating symptoms. Gait is stable. She has waxing and waning heaviness of left leg. Using cane sometimes. She had one fall since last visit. She reports getting tangled up in bed sheets and fell. No injuries.   She stopped gabapentin  due to concerns of link to dementia. She feels RLS is stable.   She continues methylphenidate . She continues to have difficulty with aphasia and cognition since stroke. She needs more time to comprehend what others are saying. She is sleeping well. She does have some anxiety and depression. She feels symptoms are stable on escitalopram.   She continues Plavix , asa and atorvastatin  80mg . No TIA/stroke symptoms. She is followed closely by PCP. BP has been fairly normal. She reports readings are usually 120-130/70's at home. She presents with BP log that shows normal readings. She was told in the past she has white coat syndrome.   She continues CPAP.     HISTORY (copied from Dr Duncan previous note)  Rachel Gilbert is 63 y.o. woman with relapsing remitting MS and OSA.     Update 10/222024:  She is on Tysabri  300 mg q 6 weeks and tolerates it well.   She was JCV Ab negative (10/17/2021) She had one 02/2020  test when JCV Ab was 0.26 with positive reflex   We discussed risk would be 1:2000 or so. We changed the dosing interval to q 6 weeks.     Her gait is doing about the sae.  She had a bad fall in April hurting her ankle.  No fall but many stumbles since then.  Legs are symmetric and she noted her muscles fatigue easily   She gets leg cramps and gabapentin  helps these.   She has tingling frequently in legs. She no longer takes gabapentin ..  She has urinary frequency but also has hesitancy and she sees urology. Not on any medications.  She has 2 x nocturia.   .    She denies current depression/anxiety and is on  Lexapro with benefit.  She is noting mild cognitive issues, word finding, with reduced short term memory.  She has fatigue many days.   Methylphenidate  helps fatigue and cognition   She sleeps well most nights.  She uses CPAP. Last Download showed 100% of the nights for 4+ hours and her efficacy is excellent (AHI = 1.1).   Methylphenidate  helps fatigue and sleepiness.   She takes 20 mg in AM and 10 mg in PM   She had the duodenal switch bariatric procedure in 2020 and has lost 80 pounds but gained about 25 pounds back since..  She is trying to do chair yoga and other exercise.    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       She had a stroke 2019 and had aphasia.  She dpes much better with just occasional errors/pauses.  She has some behavioral changes since the stroke and gets upset/angry more quickly.       Stroke History: She was hospitalized at [facility] between 04/12/2017 and 04/21/2017 due to aphasia due to a left MCA distribution stroke.   Additionally, CT angiogram appears to show occlusion of 1 of the MCA branches.     She has been placed on aspirin  and then Plavix  was added.  She had a TEE but no evidence of a PFO or other source of emboli.  Doppler study showed less than 50% stenosis.  A loop recorder is being considered.    Risk  factors:   HTN (on 3 med's), OSA, no DM, no smoking, lipids ok (though Lipitor  was added).   .      MS history: She was diagnosed in September 2012 after presenting with diplopia. She was hospitalized and received 5 days of IV steroids and she had MRI and lumbar puncture. She recalls that the lumbar puncture was not consistent with MS but the MRI was.  She was placed on Gilenya  in December 2012. She was able to return to work and the diplopia completely resolved. Then in July 2014 she had another exacerbation with visual disturbance bilaterally, gait changes and cognitive changes.  Reportedly, there was a new lesion in the brainstem and also in the spinal cord at that time. She received 5 more days of steroid.    In early May 2016, she had the onset of several symptoms including worse cognitive problems, worse gait problems and weakness in the hands.   She went to the [place] [facility] emergency room on 06/03/2014 and she was evaluated. An admitted and received 3 days of IV Solu-Medrol .The MRI of the brain shows many white matter lesions. Some are periventricular with an appearance consistent with MS. She also has deep white matter subcortical and juxtacortical foci. Some of these look more consistent with small vessel ischemic changes and most more consistent with multiple sclerosis. She does have new foci in the left vermis of the cerebellum and in the left frontal periventricular region..   She has been po Tysabri  since 2016.   Images MRI 09/24/2018:   She has multiple T2/flair hyperintense foci in the periventricular, juxtacortical, subcortical and deep white matter.  There are also foci in the pons and left thalamus.  Some of the foci in the left frontal lobe likely represent the sequela of the acute stroke noted on the 04/15/2017 MRI.  Other foci are more likely to represent demyelination from MS.  None of the foci appear to be acute and there are no new lesions compared to the 2019 study.  Also has mild  chronic sphenoid sinusitis.   MRI of the brain 06/05/2014 showed suspected active demyelinating plaque in the left corpus callosum and left vermis   MRI of the cervical spine 06/05/2014 showed a normal spinal cord.    REVIEW OF SYSTEMS: Out of a complete 14 system review of symptoms, the patient complains only of the following symptoms, aphasia, fatigue, anxiety, lower extremity weakness/heaviness, RLS and all other reviewed systems are negative.   ALLERGIES: Allergies  Allergen Reactions   Darvon [Propoxyphene] Anaphylaxis and Other (See Comments)    Throat swelling   Oxycodone Anaphylaxis   Percocet [Oxycodone-Acetaminophen ] Anaphylaxis  HOME MEDICATIONS: Outpatient Medications Prior to Visit  Medication Sig Dispense Refill   aspirin  EC 81 MG EC tablet Take 1 tablet (81 mg total) by mouth daily. 30 tablet 0   atorvastatin  (LIPITOR ) 80 MG tablet Take 1 tablet (80 mg total) by mouth daily at 6 PM. 30 tablet 0   carvedilol  (COREG ) 25 MG tablet Take 25 mg by mouth 2 (two) times daily with a meal.     clopidogrel  (PLAVIX ) 75 MG tablet Take 1 tablet by mouth once daily 90 tablet 0   escitalopram (LEXAPRO) 20 MG tablet Take 20 mg by mouth daily.     hydrALAZINE  (APRESOLINE ) 50 MG tablet Take 50 mg by mouth 2 (two) times daily.     lidocaine -prilocaine  (EMLA ) cream Apply 1 Application topically as needed. 30 g 0   methylphenidate  (RITALIN ) 10 MG tablet Take 2 pills qAM and one pill po 4 hours later 90 tablet 0   natalizumab  (TYSABRI ) 300 MG/15ML injection Inject 15 mLs into the vein every 28 (twenty-eight) days.      gabapentin  (NEURONTIN ) 300 MG capsule Take 1 capsule (300 mg total) by mouth at bedtime. 90 capsule 3   No facility-administered medications prior to visit.     PAST MEDICAL HISTORY: Past Medical History:  Diagnosis Date   Complication of anesthesia    hard time waking up    Hearing loss    Hypertension    Kidney stones    Multiple sclerosis (HCC)    Sleep apnea     Uses CPAP   Stroke (HCC) 03/2017   Vision abnormalities      PAST SURGICAL HISTORY: Past Surgical History:  Procedure Laterality Date   CESAREAN SECTION  1986   CHOLECYSTECTOMY  2000   laparoscopic   IR FLUORO GUIDED NEEDLE PLC ASPIRATION/INJECTION LOC  04/15/2017   IR IMAGING GUIDED PORT INSERTION  10/20/2018   LAPAROSCOPIC GASTRIC RESTRICTIVE DUODENAL PROCEDURE (DUODENAL SWITCH)     RADIOLOGY WITH ANESTHESIA N/A 04/15/2017   Procedure: LUMBER PUNCTURE MRI SCAN;  Surgeon: Radiologist, Medication, MD;  Location: [facility] OR;  Service: Radiology;  Laterality: N/A;   TEE WITHOUT CARDIOVERSION N/A 04/16/2017   Procedure: TRANSESOPHAGEAL ECHOCARDIOGRAM (TEE) WITH LOOP;  Surgeon: Delford Maude BROCKS, MD;  Location[facility] ENDOSCOPY;  Service: Cardiovascular;  Laterality: N/A;     FAMILY HISTORY: Family History  Problem Relation Age of Onset   Fibromyalgia Mother    Stroke Father    Diabetes Father    Stroke Other      SOCIAL HISTORY: Social History   Socioeconomic History   Marital status: Single    Spouse name: Not on file   Number of children: Not on file   Years of education: Not on file   Highest education level: Not on file  Occupational History   Occupation: disabled  Tobacco Use   Smoking status: Never   Smokeless tobacco: Never  Vaping Use   Vaping status: Never Used  Substance and Sexual Activity   Alcohol use: Not Currently    Comment: Occasional   Drug use: No   Sexual activity: Not Currently    Birth control/protection: None  Other Topics Concern   Not on file  Social History Narrative   Right Handed   3 Sodas Per Day   Social Drivers of Health   Financial Resource Strain: Low Risk  (01/28/2022)   Received from [facility] Strain (CARDIA)    Difficulty of Paying Living Expenses: Not  hard at all  Food Insecurity: Not on file  Transportation Needs: Not on file  Physical Activity: Insufficiently Active (01/28/2022)    Received from [facility]   Exercise Vital Sign    On average, how many days per week do you engage in moderate to strenuous exercise (like a brisk walk)?: 3 days    On average, how many minutes do you engage in exercise at this level?: 30 min  Stress: No Stress Concern Present (01/28/2022)   Received from [facility] of Occupational Health - Occupational Stress Questionnaire    Feeling of Stress : Not at all  Social Connections: Not on file  Intimate Partner Violence: Not on file      PHYSICAL EXAM  Vitals:   08/10/23 1407  BP: (!) 145/72  Pulse: 70  Weight: 217 lb 6.4 oz (98.6 kg)  Height: 5' 9 (1.753 m)      Body mass index is 32.1 kg/m.   Generalized: Well developed, in no acute distress  Cardiology: normal rate and rhythm, no murmur auscultated  Respiratory: clear to auscultation bilaterally    Neurological examination  Mentation: Alert oriented to time, place, history taking. Follows all commands. Having more difficulty with aphasia today. Decreased comprehension.  Cranial nerve II-XII: Pupils were equal round reactive to light. Extraocular movements were full, visual field were full on confrontational test. Facial sensation and strength were normal. Head turning and shoulder shrug  were normal and symmetric. Motor: The motor testing reveals 5 over 5 strength of all 4 extremities.  Sensory: Sensory testing is intact to soft touch on all 4 extremities. No evidence of extinction is noted.  Coordination: Cerebellar testing reveals good finger-nose-finger and reduced left heel-to-shin.  Gait and station: Gait is stable without assistive device, mildly wide and arthritic, unable to  tandem  Reflexes: Deep tendon reflexes are symmetric and slightly increased in bilateral patellar     DIAGNOSTIC DATA (LABS, IMAGING, TESTING) - I reviewed patient records, labs, notes, testing and imaging myself where available.  Lab  Results  Component Value Date   WBC 9.4 06/15/2023   HGB 12.6 06/15/2023   HCT 39.2 06/15/2023   MCV 95 06/15/2023   PLT 194 06/15/2023      Component Value Date/Time   NA 143 02/11/2019 0516   NA 143 02/24/2018 1332   K 4.4 02/11/2019 0516   CL 109 02/11/2019 0516   CO2 25 02/11/2019 0516   GLUCOSE 100 (H) 02/11/2019 0516   BUN 17 02/11/2019 0516   BUN 19 02/24/2018 1332   CREATININE 1.25 (H) 02/11/2019 0516   CALCIUM  9.0 02/11/2019 0516   PROT 6.3 (L) 02/11/2019 0516   PROT 5.9 (L) 02/24/2018 1332   ALBUMIN 4.0 02/11/2019 0516   ALBUMIN 4.3 02/24/2018 1332   AST 30 02/11/2019 0516   ALT 57 (H) 02/11/2019 0516   ALKPHOS 139 (H) 02/11/2019 0516   BILITOT 0.9 02/11/2019 0516   BILITOT 0.9 02/24/2018 1332   GFRNONAA 48 (L) 02/11/2019 0516   GFRAA 55 (L) 02/11/2019 0516   Lab Results  Component Value Date   CHOL 156 04/09/2017   HDL 46 04/09/2017   LDLCALC 82 04/09/2017   TRIG 142 04/09/2017   CHOLHDL 3.4 04/09/2017   Lab Results  Component Value Date   HGBA1C 5.5 04/09/2017   No results found for: VITAMINB12 Lab Results  Component Value Date   TSH 1.130 11/06/2016  No data to display               No data to display           ASSESSMENT AND PLAN  63 y.o. year old female  has a past medical history of Complication of anesthesia, Hearing loss, Hypertension, Kidney stones, Multiple sclerosis (HCC), Sleep apnea, Stroke (HCC) (03/2017), and Vision abnormalities. here with   Relapsing remitting multiple sclerosis (HCC)  Cerebrovascular accident (CVA) due to thrombosis of precerebral artery (HCC)  High risk medication use  Obstructive sleep apnea on CPAP  Depression with anxiety  Gait disturbance  Aphasia as late effect of cerebrovascular accident  Ms. Montero is doing well, today. MS symptoms are stable. No new or worsening stroke symptoms. We will continue Tysabri . She is scheduled for 6 week infusions. Labs stable 03/2023. MRI stable  10/2021. She will continue methylphenidate  20mg  in morning and 10mg  at lunch as needed. She will continue CPAP therapy daily. She was encouraged to use nightly for at least 4 hours. She will continue close follow up with PCP. She will keep a close eye on BP at home. Healthy lifestyle habits advised. She will follow up with Dr Vear in 6 months, sooner if needed.    No orders of the defined types were placed in this encounter.    No orders of the defined types were placed in this encounter.   Greig Forbes, MSN, FNP-C 08/10/2023, 2:38 PM  [facility] [address] [place], [place] [address] [phone]

## 2023-08-10 NOTE — Patient Instructions (Signed)
 Below is our plan:  We will continue current treatment plan. Luke will update labs at your infusion in August.   Please continue using your CPAP regularly. While your insurance requires that you use CPAP at least 4 hours each night on 70% of the nights, I recommend, that you not skip any nights and use it throughout the night if you can. Getting used to CPAP and staying with the treatment long term does take time and patience and discipline. Untreated obstructive sleep apnea when it is moderate to severe can have an adverse impact on cardiovascular health and raise her risk for heart disease, arrhythmias, hypertension, congestive heart failure, stroke and diabetes. Untreated obstructive sleep apnea causes sleep disruption, nonrestorative sleep, and sleep deprivation. This can have an impact on your day to day functioning and cause daytime sleepiness and impairment of cognitive function, memory loss, mood disturbance, and problems focussing. Using CPAP regularly can improve these symptoms.  We will update supply orders, today.    Please make sure you are staying well hydrated. I recommend 50-60 ounces daily. Well balanced diet and regular exercise encouraged. Consistent sleep schedule with 6-8 hours recommended.   Please continue follow up with care team as directed.   Follow up with Dr Vear in 6 months   You may receive a survey regarding today's visit. I encourage you to leave honest feed back as I do use this information to improve patient care. Thank you for seeing me today!

## 2023-09-05 ENCOUNTER — Other Ambulatory Visit: Payer: Self-pay | Admitting: Neurology

## 2023-09-07 ENCOUNTER — Encounter: Payer: Self-pay | Admitting: Neurology

## 2023-09-07 ENCOUNTER — Telehealth: Payer: Self-pay | Admitting: *Deleted

## 2023-09-07 ENCOUNTER — Other Ambulatory Visit: Payer: Self-pay | Admitting: *Deleted

## 2023-09-07 ENCOUNTER — Other Ambulatory Visit (INDEPENDENT_AMBULATORY_CARE_PROVIDER_SITE_OTHER): Payer: Self-pay

## 2023-09-07 DIAGNOSIS — Z0289 Encounter for other administrative examinations: Secondary | ICD-10-CM

## 2023-09-07 DIAGNOSIS — Z79899 Other long term (current) drug therapy: Secondary | ICD-10-CM | POA: Diagnosis not present

## 2023-09-07 DIAGNOSIS — G35 Multiple sclerosis: Secondary | ICD-10-CM

## 2023-09-07 NOTE — Telephone Encounter (Signed)
 Placed JCV lab in [facility] lock box for routine lab pick up. Results pending.

## 2023-09-07 NOTE — Telephone Encounter (Signed)
 Last seen on 08/10/23 Follow up scheduled on 03/15/24

## 2023-09-08 ENCOUNTER — Ambulatory Visit: Payer: Self-pay | Admitting: Neurology

## 2023-09-08 LAB — CBC WITH DIFFERENTIAL/PLATELET
Basophils Absolute: 0 x10E3/uL (ref 0.0–0.2)
Basos: 0 %
EOS (ABSOLUTE): 0.2 x10E3/uL (ref 0.0–0.4)
Eos: 3 %
Hematocrit: 38.4 % (ref 34.0–46.6)
Hemoglobin: 12.2 g/dL (ref 11.1–15.9)
Immature Grans (Abs): 0 x10E3/uL (ref 0.0–0.1)
Immature Granulocytes: 0 %
Lymphocytes Absolute: 2.1 x10E3/uL (ref 0.7–3.1)
Lymphs: 26 %
MCH: 29.6 pg (ref 26.6–33.0)
MCHC: 31.8 g/dL (ref 31.5–35.7)
MCV: 93 fL (ref 79–97)
Monocytes Absolute: 0.6 x10E3/uL (ref 0.1–0.9)
Monocytes: 8 %
Neutrophils Absolute: 5 x10E3/uL (ref 1.4–7.0)
Neutrophils: 63 %
Platelets: 198 x10E3/uL (ref 150–450)
RBC: 4.12 x10E6/uL (ref 3.77–5.28)
RDW: 12.9 % (ref 11.7–15.4)
WBC: 8 x10E3/uL (ref 3.4–10.8)

## 2023-09-16 NOTE — Telephone Encounter (Signed)
 Results not back yet. Called [facility] at [phone]. Account# [id number]. Spoke w/ Melville. Lab received and sent out 09/08/23. She called processing lab to get estimate of when result should be back.  Should have final results by early next next week

## 2023-09-21 NOTE — Telephone Encounter (Signed)
 SABRA

## 2023-10-07 DIAGNOSIS — I10 Essential (primary) hypertension: Secondary | ICD-10-CM | POA: Diagnosis not present

## 2023-10-07 DIAGNOSIS — G4733 Obstructive sleep apnea (adult) (pediatric): Secondary | ICD-10-CM | POA: Diagnosis not present

## 2023-10-19 DIAGNOSIS — G35 Multiple sclerosis: Secondary | ICD-10-CM | POA: Diagnosis not present

## 2023-11-30 ENCOUNTER — Other Ambulatory Visit: Payer: Self-pay | Admitting: Neurology

## 2023-11-30 ENCOUNTER — Other Ambulatory Visit: Payer: Self-pay

## 2023-11-30 DIAGNOSIS — G35D Multiple sclerosis, unspecified: Secondary | ICD-10-CM

## 2023-11-30 DIAGNOSIS — Z79899 Other long term (current) drug therapy: Secondary | ICD-10-CM | POA: Diagnosis not present

## 2023-11-30 DIAGNOSIS — G35A Relapsing-remitting multiple sclerosis: Secondary | ICD-10-CM

## 2023-11-30 DIAGNOSIS — Z95828 Presence of other vascular implants and grafts: Secondary | ICD-10-CM

## 2023-12-01 ENCOUNTER — Encounter: Payer: Self-pay | Admitting: Neurology

## 2023-12-02 ENCOUNTER — Telehealth: Payer: Self-pay | Admitting: Family Medicine

## 2023-12-02 NOTE — Telephone Encounter (Signed)
 Referral for interventional radiology fax to [facility]. Phone: [phone], Fax: [phone]

## 2023-12-03 ENCOUNTER — Telehealth (HOSPITAL_COMMUNITY): Payer: Self-pay | Admitting: Radiology

## 2023-12-03 ENCOUNTER — Other Ambulatory Visit (HOSPITAL_COMMUNITY): Payer: Self-pay | Admitting: Neurology

## 2023-12-03 DIAGNOSIS — Z95828 Presence of other vascular implants and grafts: Secondary | ICD-10-CM

## 2023-12-03 DIAGNOSIS — G35D Multiple sclerosis, unspecified: Secondary | ICD-10-CM

## 2023-12-03 NOTE — Telephone Encounter (Signed)
 Called pt to schedule port check/repair. Left VM. JM

## 2023-12-07 ENCOUNTER — Telehealth (HOSPITAL_COMMUNITY): Payer: Self-pay | Admitting: Radiology

## 2023-12-07 NOTE — Telephone Encounter (Signed)
 Called pt to schedule port check. Left VM. JM

## 2023-12-07 NOTE — Telephone Encounter (Signed)
 Spoke with patient, she would prefer to have her port check done at [facility]. I gave the patient the phone number for Clarita Ricker (IR scheduler). She will call her now.

## 2023-12-08 ENCOUNTER — Encounter: Payer: Self-pay | Admitting: Neurology

## 2023-12-08 LAB — CBC WITH DIFFERENTIAL/PLATELET
Basophils Absolute: 0.1 x10E3/uL (ref 0.0–0.2)
Basos: 1 %
EOS (ABSOLUTE): 0.2 x10E3/uL (ref 0.0–0.4)
Eos: 3 %
Hematocrit: 41.9 % (ref 34.0–46.6)
Hemoglobin: 13.1 g/dL (ref 11.1–15.9)
Immature Grans (Abs): 0 x10E3/uL (ref 0.0–0.1)
Immature Granulocytes: 0 %
Lymphocytes Absolute: 2.3 x10E3/uL (ref 0.7–3.1)
Lymphs: 28 %
MCH: 29.4 pg (ref 26.6–33.0)
MCHC: 31.3 g/dL — ABNORMAL LOW (ref 31.5–35.7)
MCV: 94 fL (ref 79–97)
Monocytes Absolute: 0.6 x10E3/uL (ref 0.1–0.9)
Monocytes: 7 %
Neutrophils Absolute: 5 x10E3/uL (ref 1.4–7.0)
Neutrophils: 60 %
Platelets: 200 x10E3/uL (ref 150–450)
RBC: 4.45 x10E6/uL (ref 3.77–5.28)
RDW: 13.5 % (ref 11.7–15.4)
WBC: 8.2 x10E3/uL (ref 3.4–10.8)

## 2023-12-08 LAB — STRATIFY JCV(TM) AB W/INDEX

## 2023-12-15 ENCOUNTER — Ambulatory Visit
Admission: RE | Admit: 2023-12-15 | Discharge: 2023-12-15 | Disposition: A | Discharge status: Home / Self Care | Source: Ambulatory Visit | Attending: Neurology | Admitting: Neurology

## 2023-12-15 DIAGNOSIS — Z452 Encounter for adjustment and management of vascular access device: Secondary | ICD-10-CM | POA: Insufficient documentation

## 2023-12-15 DIAGNOSIS — G35D Multiple sclerosis, unspecified: Secondary | ICD-10-CM | POA: Insufficient documentation

## 2023-12-15 DIAGNOSIS — T82828A Fibrosis of vascular prosthetic devices, implants and grafts, initial encounter: Secondary | ICD-10-CM | POA: Diagnosis not present

## 2023-12-15 DIAGNOSIS — Z95828 Presence of other vascular implants and grafts: Secondary | ICD-10-CM

## 2023-12-15 HISTORY — PX: IR CV LINE INJECTION: IMG2294

## 2023-12-15 MED ORDER — HEPARIN SOD (PORK) LOCK FLUSH 100 UNIT/ML IV SOLN
INTRAVENOUS | Status: AC
  Filled 2023-12-15: qty 5

## 2023-12-15 MED ORDER — IOHEXOL 300 MG/ML  SOLN
15.0000 mL | Freq: Once | INTRAMUSCULAR | Status: AC | PRN
  Administered 2023-12-15: 15 mL via INTRAVENOUS

## 2023-12-15 MED ORDER — HEPARIN SOD (PORK) LOCK FLUSH 100 UNIT/ML IV SOLN
500.0000 [IU] | Freq: Once | INTRAVENOUS | Status: AC
  Administered 2023-12-15: 500 [IU] via INTRAVENOUS

## 2023-12-16 ENCOUNTER — Other Ambulatory Visit: Payer: Self-pay | Admitting: Neurology

## 2023-12-16 DIAGNOSIS — I878 Other specified disorders of veins: Secondary | ICD-10-CM

## 2023-12-21 NOTE — Progress Notes (Signed)
 Patient for IR Port Insertion on Tues 12/22/23, I called and spoke with the patient on the phone and gave pre-procedure instructions. Pt was made aware to be here at 10a, NPO after MN prior to procedure as well as driver post procedure/recovery/discharge. Pt stated understanding.  Called 12/21/23

## 2023-12-22 ENCOUNTER — Other Ambulatory Visit: Payer: Self-pay | Admitting: Diagnostic Radiology

## 2023-12-22 ENCOUNTER — Other Ambulatory Visit: Payer: Self-pay

## 2023-12-22 ENCOUNTER — Encounter: Payer: Self-pay | Admitting: Radiology

## 2023-12-22 ENCOUNTER — Ambulatory Visit
Admission: RE | Admit: 2023-12-22 | Discharge: 2023-12-22 | Disposition: A | Discharge status: Home / Self Care | Source: Ambulatory Visit | Attending: Neurology | Admitting: Neurology

## 2023-12-22 DIAGNOSIS — T82514A Breakdown (mechanical) of infusion catheter, initial encounter: Secondary | ICD-10-CM | POA: Diagnosis not present

## 2023-12-22 DIAGNOSIS — Y848 Other medical procedures as the cause of abnormal reaction of the patient, or of later complication, without mention of misadventure at the time of the procedure: Secondary | ICD-10-CM | POA: Insufficient documentation

## 2023-12-22 DIAGNOSIS — Z8673 Personal history of transient ischemic attack (TIA), and cerebral infarction without residual deficits: Secondary | ICD-10-CM | POA: Diagnosis not present

## 2023-12-22 DIAGNOSIS — Z7982 Long term (current) use of aspirin: Secondary | ICD-10-CM | POA: Diagnosis not present

## 2023-12-22 DIAGNOSIS — G35D Multiple sclerosis, unspecified: Secondary | ICD-10-CM | POA: Diagnosis not present

## 2023-12-22 DIAGNOSIS — G4730 Sleep apnea, unspecified: Secondary | ICD-10-CM | POA: Insufficient documentation

## 2023-12-22 DIAGNOSIS — I10 Essential (primary) hypertension: Secondary | ICD-10-CM | POA: Insufficient documentation

## 2023-12-22 DIAGNOSIS — Z79899 Other long term (current) drug therapy: Secondary | ICD-10-CM | POA: Insufficient documentation

## 2023-12-22 DIAGNOSIS — T82828A Fibrosis of vascular prosthetic devices, implants and grafts, initial encounter: Secondary | ICD-10-CM | POA: Diagnosis not present

## 2023-12-22 DIAGNOSIS — I878 Other specified disorders of veins: Secondary | ICD-10-CM

## 2023-12-22 HISTORY — PX: IR IMAGING GUIDED PORT INSERTION: IMG5740

## 2023-12-22 HISTORY — PX: IR REMOVAL TUN ACCESS W/ PORT W/O FL MOD SED: IMG2290

## 2023-12-22 HISTORY — PX: IR VENOCAVAGRAM SVC: IMG679

## 2023-12-22 MED ORDER — LIDOCAINE-EPINEPHRINE 1 %-1:100000 IJ SOLN
10.0000 mL | Freq: Once | INTRAMUSCULAR | Status: AC
  Administered 2023-12-22: 20 mL via INTRADERMAL

## 2023-12-22 MED ORDER — MIDAZOLAM HCL (PF) 2 MG/2ML IJ SOLN
INTRAMUSCULAR | Status: AC | PRN
  Administered 2023-12-22: .5 mg via INTRAVENOUS
  Administered 2023-12-22: 1 mg via INTRAVENOUS
  Administered 2023-12-22: .5 mg via INTRAVENOUS
  Administered 2023-12-22: 1 mg via INTRAVENOUS

## 2023-12-22 MED ORDER — LIDOCAINE HCL 1 % IJ SOLN
INTRAMUSCULAR | Status: AC
  Filled 2023-12-22: qty 20

## 2023-12-22 MED ORDER — LIDOCAINE-EPINEPHRINE 1 %-1:100000 IJ SOLN
INTRAMUSCULAR | Status: AC
  Filled 2023-12-22: qty 1

## 2023-12-22 MED ORDER — HEPARIN SOD (PORK) LOCK FLUSH 100 UNIT/ML IV SOLN
INTRAVENOUS | Status: AC
  Filled 2023-12-22: qty 5

## 2023-12-22 MED ORDER — HEPARIN SOD (PORK) LOCK FLUSH 100 UNIT/ML IV SOLN
500.0000 [IU] | Freq: Once | INTRAVENOUS | Status: AC
  Administered 2023-12-22: 500 [IU] via INTRAVENOUS

## 2023-12-22 MED ORDER — MIDAZOLAM HCL 2 MG/2ML IJ SOLN
INTRAMUSCULAR | Status: AC
Start: 2023-12-22 — End: 2023-12-22
  Filled 2023-12-22: qty 2

## 2023-12-22 MED ORDER — FENTANYL CITRATE (PF) 100 MCG/2ML IJ SOLN
INTRAMUSCULAR | Status: AC | PRN
Start: 2023-12-22 — End: 2023-12-22
  Administered 2023-12-22: 25 ug via INTRAVENOUS
  Administered 2023-12-22: 50 ug via INTRAVENOUS
  Administered 2023-12-22: 25 ug via INTRAVENOUS
  Administered 2023-12-22: 50 ug via INTRAVENOUS

## 2023-12-22 MED ORDER — CEFAZOLIN SODIUM-DEXTROSE 2-4 GM/100ML-% IV SOLN
INTRAVENOUS | Status: AC
  Filled 2023-12-22: qty 100

## 2023-12-22 MED ORDER — IOHEXOL 300 MG/ML  SOLN
10.0000 mL | Freq: Once | INTRAMUSCULAR | Status: AC | PRN
  Administered 2023-12-22: 10 mL via INTRAVENOUS

## 2023-12-22 MED ORDER — FENTANYL CITRATE (PF) 100 MCG/2ML IJ SOLN
INTRAMUSCULAR | Status: AC
  Filled 2023-12-22: qty 2

## 2023-12-22 MED ORDER — CEFAZOLIN SODIUM-DEXTROSE 2-4 GM/100ML-% IV SOLN
INTRAVENOUS | Status: AC | PRN
  Administered 2023-12-22: 2 g via INTRAVENOUS

## 2023-12-22 MED ORDER — SODIUM CHLORIDE 0.9 % IV SOLN: INTRAVENOUS | Status: DC

## 2023-12-22 MED ORDER — MIDAZOLAM HCL 2 MG/2ML IJ SOLN
INTRAMUSCULAR | Status: AC
  Filled 2023-12-22: qty 2

## 2023-12-22 MED ORDER — LIDOCAINE HCL 1 % IJ SOLN
10.0000 mL | Freq: Once | INTRAMUSCULAR | Status: AC
  Administered 2023-12-22: 20 mL via INTRADERMAL

## 2023-12-22 NOTE — H&P (Signed)
 Chief Complaint:  Fibrin Sheath on Port-a-catheter  Procedure: Port-a-catheter exchange  Referring Provider(s): Dr. Charlie Crete  Supervising Physician: Philip Cornet  Patient Status: [facility] - Out-pt  History of Present Illness: Rachel Gilbert is a 63 y.o. female with a history of sleep apnea, HTN, stroke, and MS with monthly Tysabri  infusions who underwent port-a-catheter insertion in 2020 with Dr. VEAR Lent. Patient reports at her last 3 infusions, they have not been able to pull blood back from her port. She was seen in IR on 11/18 with Dr. Jenna for a line injection which revealed a fibrin sheath surrounding the current catheter. She was subsequently set up for port-replacement.  She presents today for her port exchange/revision. States that she has been doing well overall. She denies any fevers/chills, abdominal pain, chest pain, or difficulty breathing. Dr. Philip to the bedside to discuss various port management options with patient who is agreeable to current plan to attempt revision on the right side with possible new placement on the left as needed. NPO since midnight. All questions and concerns answered at the bedside.   Patient is Full Code  Past Medical History:  Diagnosis Date   Complication of anesthesia    hard time waking up    Hearing loss    Hypertension    Kidney stones    Multiple sclerosis    Sleep apnea    Uses CPAP   Stroke (HCC) 03/2017   Vision abnormalities     Past Surgical History:  Procedure Laterality Date   CESAREAN SECTION  1986   CHOLECYSTECTOMY  2000   laparoscopic   IR CV LINE INJECTION  12/15/2023   IR FLUORO GUIDED NEEDLE PLC ASPIRATION/INJECTION LOC  04/15/2017   IR IMAGING GUIDED PORT INSERTION  10/20/2018   LAPAROSCOPIC GASTRIC RESTRICTIVE DUODENAL PROCEDURE (DUODENAL SWITCH)     RADIOLOGY WITH ANESTHESIA N/A 04/15/2017   Procedure: LUMBER PUNCTURE MRI SCAN;  Surgeon: Radiologist, Medication, MD;  Location: [facility] OR;  Service: Radiology;   Laterality: N/A;   TEE WITHOUT CARDIOVERSION N/A 04/16/2017   Procedure: TRANSESOPHAGEAL ECHOCARDIOGRAM (TEE) WITH LOOP;  Surgeon: Delford Maude BROCKS, MD;  Location: [facility] ENDOSCOPY;  Service: Cardiovascular;  Laterality: N/A;    Allergies: Darvon [propoxyphene], Oxycodone, and Percocet [oxycodone-acetaminophen ]  Medications: Prior to Admission medications   Medication Sig Start Date End Date Taking? Authorizing Provider  aspirin  EC 81 MG EC tablet Take 1 tablet (81 mg total) by mouth daily. 04/12/17   Yisroel Sleight, MD  atorvastatin  (LIPITOR ) 80 MG tablet Take 1 tablet (80 mg total) by mouth daily at 6 PM. 04/21/17   Rosario Leatrice FERNS, MD  carvedilol  (COREG ) 25 MG tablet Take 25 mg by mouth 2 (two) times daily with a meal.    [provider]  clopidogrel  (PLAVIX ) 75 MG tablet Take 1 tablet by mouth once daily 09/07/23   Sater, Charlie LABOR, MD  escitalopram (LEXAPRO) 20 MG tablet Take 20 mg by mouth daily.    [provider]  hydrALAZINE  (APRESOLINE ) 50 MG tablet Take 50 mg by mouth 2 (two) times daily.    [provider]  lidocaine -prilocaine  (EMLA ) cream Apply 1 Application topically as needed. 07/28/23   Sater, Charlie LABOR, MD  methylphenidate  (RITALIN ) 10 MG tablet Take 2 pills qAM and one pill po 4 hours later 08/10/23   Lomax, Amy, NP  natalizumab  (TYSABRI ) 300 MG/15ML injection Inject 15 mLs into the vein every 28 (twenty-eight) days.     [provider]  Family History  Problem Relation Age of Onset   Fibromyalgia Mother    Stroke Father    Diabetes Father    Stroke Other     Social History   Socioeconomic History   Marital status: Single    Spouse name: Not on file   Number of children: Not on file   Years of education: Not on file   Highest education level: Not on file  Occupational History   Occupation: disabled  Tobacco Use   Smoking status: Never   Smokeless tobacco: Never  Vaping Use   Vaping status: Never Used  Substance and Sexual  Activity   Alcohol use: Not Currently    Comment: Occasional   Drug use: No   Sexual activity: Not Currently    Birth control/protection: None  Other Topics Concern   Not on file  Social History Narrative   Right Handed   3 Sodas Per Day   Social Drivers of Health   Financial Resource Strain: Low Risk (01/28/2022)   Received from [facility] [employer] Strain (CARDIA)    Difficulty of Paying Living Expenses: Not hard at all  Food Insecurity: Not on file  Transportation Needs: Not on file  Physical Activity: Insufficiently Active (01/28/2022)   Received from [facility]   Exercise Vital Sign    On average, how many days per week do you engage in moderate to strenuous exercise (like a brisk walk)?: 3 days    On average, how many minutes do you engage in exercise at this level?: 30 min  Stress: No Stress Concern Present (01/28/2022)   Received from [facility] [employer] of Occupational Health - Occupational Stress Questionnaire    Feeling of Stress : Not at all  Social Connections: Not on file    Review of Systems Patient denies any headache, chest pain, shortness of breath, abdominal pain, N/V, or fever/chills. All other systems are negative.   Vital Signs: BP (!) 176/105   Pulse 62   Temp 97.9 F (36.6 C) (Temporal)   Resp (!) 24   Ht 5' 9 (1.753 m)   Wt 225 lb (102.1 kg)   LMP 01/15/2015   SpO2 97%   BMI 33.23 kg/m   Physical Exam Vitals reviewed.  Constitutional:      Appearance: Normal appearance.  HENT:     Mouth/Throat:     Mouth: Mucous membranes are moist.     Pharynx: Oropharynx is clear.  Cardiovascular:     Rate and Rhythm: Normal rate and regular rhythm.     Heart sounds: Normal heart sounds.     Comments: Palpable port on the right chest wall; no erythema or tenderness to palpation Pulmonary:     Effort: Pulmonary effort is normal.     Breath sounds: Normal breath sounds.   Abdominal:     General: Abdomen is flat.     Palpations: Abdomen is soft.     Tenderness: There is no abdominal tenderness.  Skin:    General: Skin is warm and dry.  Neurological:     Mental Status: She is alert and oriented to person, place, and time.  Psychiatric:        Behavior: Behavior normal.     Imaging: IR CV Line Injection Result Date: 12/15/2023 INDICATION: Malfunctioning right-sided chest port EXAM: Port check MEDICATIONS: 15 mL Omnipaque  300 ANESTHESIA/SEDATION: None FLUOROSCOPY: Radiation Exposure Index (as provided by the fluoroscopic device): 12  mGy Kerma COMPLICATIONS: None immediate. PROCEDURE: Informed written consent was obtained from the patient after a thorough discussion of the procedural risks, benefits and alternatives. All questions were addressed. Maximal Sterile Barrier Technique was utilized including caps, mask, sterile gowns, sterile gloves, sterile drape, hand hygiene and skin antiseptic. A timeout was performed prior to the initiation of the procedure. The skin overlying the right internal jugular vein single-lumen chest port was sterilized in usual fashion. The chest port was then accessed using a Huber needle. Dilute contrast was then injected under fluoroscopic guidance. Contrast flows away from the catheter which is intact, however contrast does not flow freely as there appears to be a fibrin sheath with contrast initially going retrograde along the catheter tip. IMPRESSION: Right internal jugular vein single-lumen chest port placed 10/20/2018 presents with fibrin sheath. After discussing with the patient. Plan for port removal and new port catheter placement on the left side. Patient has an underlying diagnosis of MS and is using the chest port frequently for therapy. Plan for placement of new port prior to next injection on December 3rd. Electronically Signed   By: Cordella Banner   On: 12/15/2023 16:01    Labs:  CBC: Recent Labs    03/23/23 1530  06/15/23 1354 09/07/23 1415 11/30/23 1330  WBC 8.0 9.4 8.0 8.2  HGB 12.4 12.6 12.2 13.1  HCT 38.6 39.2 38.4 41.9  PLT 218 194 198 200    COAGS: No results for input(s): INR, APTT in the last 8760 hours.  BMP: No results for input(s): NA, K, CL, CO2, GLUCOSE, BUN, CALCIUM , CREATININE, GFRNONAA, GFRAA in the last 8760 hours.  Invalid input(s): CMP  LIVER FUNCTION TESTS: No results for input(s): BILITOT, AST, ALT, ALKPHOS, PROT, ALBUMIN in the last 8760 hours.  TUMOR MARKERS: No results for input(s): AFPTM, CEA, CA199, CHROMGRNA in the last 8760 hours.  Assessment and Plan:  Multiple Sclerosis w/ monthly infusions: Rachel Gilbert is a 63 y.o. female with a history of MS undergoing monthly infusions with Tysabri  for the past several years. She presents to [facility] Interventional Radiology department for an image-guided port-a-catheter revision with Dr. DELENA Balder. Procedure to be performed under moderate sedation.  Risks and benefits of image guided port-a-catheter placement was discussed with the patient including, but not limited to bleeding, infection, pneumothorax, or fibrin sheath development and need for additional procedures.  All of the patient's questions were answered, patient is agreeable to proceed. Consent signed and in chart.   Thank you for allowing our service to participate in Rachel Gilbert 's care.    Electronically Signed: Marnisha Stampley M Shadow Stiggers, PA-C   12/22/2023, 10:53 AM     I spent a total of 15 Minutes in face to face in clinical consultation, greater than 50% of which was counseling/coordinating care for port-a-catheter exchange.

## 2023-12-22 NOTE — Discharge Instructions (Signed)
 Implanted [facility] Guide  An implanted port is a type of central line that is placed under the skin. Central lines are used to provide IV access when treatment or nutrition needs to be given through a person's veins. Implanted ports are used for long-term IV access. An implanted port may be placed because: You need IV medicine that would be irritating to the small veins in your hands or arms. You need long-term IV medicines, such as antibiotics. You need IV nutrition for a long period. You need frequent blood draws for lab tests. You need dialysis.   Implanted ports are usually placed in the chest area, but they can also be placed in the upper arm, the abdomen, or the leg. An implanted port has two main parts: Reservoir. The reservoir is round and will appear as a small, raised area under your skin. The reservoir is the part where a needle is inserted to give medicines or draw blood. Catheter. The catheter is a thin, flexible tube that extends from the reservoir. The catheter is placed into a large vein. Medicine that is inserted into the reservoir goes into the catheter and then into the vein.   How will I care for my incision  You may shower tomorrow Please remove dressing in 24hrs not other skin care is needed  How is my port accessed? Special steps must be taken to access the port: Before the port is accessed, a numbing cream can be placed on the skin. This helps numb the skin over the port site. Your health care provider uses a sterile technique to access the port. Your health care provider must put on a mask and sterile gloves. The skin over your port is cleaned carefully with an antiseptic and allowed to dry. The port is gently pinched between sterile gloves, and a needle is inserted into the port. Only "non-coring" port needles should be used to access the port. Once the port is accessed, a blood return should be checked. This helps ensure that the port is in the vein and is not  clogged. If your port needs to remain accessed for a constant infusion, a clear (transparent) bandage will be placed over the needle site. The bandage and needle will need to be changed every week, or as directed by your health care provider.   What is flushing? Flushing helps keep the port from getting clogged. Follow your health care provider's instructions on how and when to flush the port. Ports are usually flushed with saline solution or a medicine called heparin. The need for flushing will depend on how the port is used. If the port is used for intermittent medicines or blood draws, the port will need to be flushed: After medicines have been given. After blood has been drawn. As part of routine maintenance. If a constant infusion is running, the port may not need to be flushed.   How long will my port stay implanted? The port can stay in for as long as your health care provider thinks it is needed. When it is time for the port to come out, surgery will be done to remove it. The procedure is similar to the one performed when the port was put in. When should I seek immediate medical care? When you have an implanted port, you should seek immediate medical care if: You notice a bad smell coming from the incision site. You have swelling, redness, or drainage at the incision site. You have more swelling or pain at the  port site or the surrounding area. You have a fever that is not controlled with medicine.   This information is not intended to replace advice given to you by your health care provider. Make sure you discuss any questions you have with your health care provider. Document Released: 01/13/2005 Document Revised: 06/21/2015 Document Reviewed: 09/20/2012 Elsevier Interactive Patient Education  2017 [employer].

## 2023-12-22 NOTE — Procedures (Signed)
 Interventional Radiology Procedure:   Indications: Right jugular port is not aspirating due to fibrin sheath  Procedure: Port placement and port removal  Findings: Right jugular vein is thrombosed around port catheter.  Attempted to exchange port over a wire but unsuccessful.  Right chest port removed.  New left jugular port placed, tip at SVC/RA junction.    Complications: None     EBL: Minimal, less than 10 ml  Plan: Discharge in one hour.  Keep port site and incisions dry for at least 24 hours.     Cyleigh Massaro R. Philip, MD  Pager: [phone]

## 2024-01-11 DIAGNOSIS — G35A Relapsing-remitting multiple sclerosis: Secondary | ICD-10-CM | POA: Diagnosis not present

## 2024-01-12 DIAGNOSIS — G4733 Obstructive sleep apnea (adult) (pediatric): Secondary | ICD-10-CM | POA: Diagnosis not present

## 2024-01-12 DIAGNOSIS — I10 Essential (primary) hypertension: Secondary | ICD-10-CM | POA: Diagnosis not present

## 2024-02-01 ENCOUNTER — Telehealth: Payer: Self-pay | Admitting: *Deleted

## 2024-02-01 NOTE — Telephone Encounter (Signed)
 [employer] outreach form faxed to [phone] confirmation received.

## 2024-02-08 ENCOUNTER — Other Ambulatory Visit: Payer: Self-pay | Admitting: Neurology

## 2024-02-18 ENCOUNTER — Other Ambulatory Visit: Payer: Self-pay | Admitting: *Deleted

## 2024-02-18 DIAGNOSIS — G35A Relapsing-remitting multiple sclerosis: Secondary | ICD-10-CM

## 2024-02-18 DIAGNOSIS — Z79899 Other long term (current) drug therapy: Secondary | ICD-10-CM

## 2024-02-18 DIAGNOSIS — G35D Multiple sclerosis, unspecified: Secondary | ICD-10-CM

## 2024-02-25 ENCOUNTER — Other Ambulatory Visit: Payer: Self-pay

## 2024-02-25 ENCOUNTER — Other Ambulatory Visit (INDEPENDENT_AMBULATORY_CARE_PROVIDER_SITE_OTHER): Payer: Self-pay

## 2024-02-25 DIAGNOSIS — G35A Relapsing-remitting multiple sclerosis: Secondary | ICD-10-CM

## 2024-02-25 DIAGNOSIS — Z79899 Other long term (current) drug therapy: Secondary | ICD-10-CM

## 2024-02-25 DIAGNOSIS — G35D Multiple sclerosis, unspecified: Secondary | ICD-10-CM

## 2024-02-25 DIAGNOSIS — Z0289 Encounter for other administrative examinations: Secondary | ICD-10-CM

## 2024-02-26 LAB — STRATIFY JCV(TM) AB W/INDEX

## 2024-03-15 ENCOUNTER — Ambulatory Visit: Admitting: Neurology
